# Patient Record
Sex: Male | Born: 1965 | Race: White | Hispanic: No | Marital: Married | Smoking: Never smoker
Health system: Southern US, Community
[De-identification: ages and names within clinical notes are randomized; demographics above are authoritative.]

## PROBLEM LIST (undated history)

## (undated) DIAGNOSIS — C189 Malignant neoplasm of colon, unspecified: Secondary | ICD-10-CM

## (undated) DIAGNOSIS — C787 Secondary malignant neoplasm of liver and intrahepatic bile duct: Secondary | ICD-10-CM

## (undated) HISTORY — DX: Secondary malignant neoplasm of liver and intrahepatic bile duct: C78.7

## (undated) HISTORY — DX: Malignant neoplasm of colon, unspecified: C18.9

---

## 2007-04-24 ENCOUNTER — Emergency Department: Payer: Self-pay | Admitting: Emergency Medicine

## 2007-04-25 ENCOUNTER — Emergency Department: Payer: Self-pay | Admitting: Emergency Medicine

## 2014-07-28 ENCOUNTER — Encounter (HOSPITAL_COMMUNITY): Payer: Self-pay | Admitting: Emergency Medicine

## 2014-07-28 ENCOUNTER — Inpatient Hospital Stay (HOSPITAL_COMMUNITY): Payer: BLUE CROSS/BLUE SHIELD

## 2014-07-28 ENCOUNTER — Inpatient Hospital Stay (HOSPITAL_COMMUNITY)
Admission: EM | Admit: 2014-07-28 | Discharge: 2014-08-01 | DRG: 374 | Disposition: A | Discharge status: Home / Self Care | Payer: BLUE CROSS/BLUE SHIELD | Attending: Internal Medicine | Admitting: Internal Medicine

## 2014-07-28 DIAGNOSIS — L720 Epidermal cyst: Secondary | ICD-10-CM | POA: Diagnosis present

## 2014-07-28 DIAGNOSIS — E669 Obesity, unspecified: Secondary | ICD-10-CM | POA: Diagnosis present

## 2014-07-28 DIAGNOSIS — D509 Iron deficiency anemia, unspecified: Secondary | ICD-10-CM

## 2014-07-28 DIAGNOSIS — F17220 Nicotine dependence, chewing tobacco, uncomplicated: Secondary | ICD-10-CM | POA: Diagnosis present

## 2014-07-28 DIAGNOSIS — C187 Malignant neoplasm of sigmoid colon: Secondary | ICD-10-CM | POA: Diagnosis present

## 2014-07-28 DIAGNOSIS — R109 Unspecified abdominal pain: Secondary | ICD-10-CM | POA: Diagnosis present

## 2014-07-28 DIAGNOSIS — Z79899 Other long term (current) drug therapy: Secondary | ICD-10-CM

## 2014-07-28 DIAGNOSIS — D72829 Elevated white blood cell count, unspecified: Secondary | ICD-10-CM | POA: Diagnosis present

## 2014-07-28 DIAGNOSIS — C189 Malignant neoplasm of colon, unspecified: Secondary | ICD-10-CM

## 2014-07-28 DIAGNOSIS — K922 Gastrointestinal hemorrhage, unspecified: Secondary | ICD-10-CM | POA: Diagnosis present

## 2014-07-28 DIAGNOSIS — Z6830 Body mass index (BMI) 30.0-30.9, adult: Secondary | ICD-10-CM

## 2014-07-28 DIAGNOSIS — Z95828 Presence of other vascular implants and grafts: Secondary | ICD-10-CM

## 2014-07-28 DIAGNOSIS — D490 Neoplasm of unspecified behavior of digestive system: Secondary | ICD-10-CM

## 2014-07-28 DIAGNOSIS — C787 Secondary malignant neoplasm of liver and intrahepatic bile duct: Secondary | ICD-10-CM | POA: Diagnosis present

## 2014-07-28 DIAGNOSIS — E43 Unspecified severe protein-calorie malnutrition: Secondary | ICD-10-CM | POA: Diagnosis present

## 2014-07-28 DIAGNOSIS — Z791 Long term (current) use of non-steroidal anti-inflammatories (NSAID): Secondary | ICD-10-CM | POA: Diagnosis not present

## 2014-07-28 DIAGNOSIS — D649 Anemia, unspecified: Secondary | ICD-10-CM

## 2014-07-28 DIAGNOSIS — D62 Acute posthemorrhagic anemia: Secondary | ICD-10-CM | POA: Diagnosis present

## 2014-07-28 DIAGNOSIS — Z419 Encounter for procedure for purposes other than remedying health state, unspecified: Secondary | ICD-10-CM

## 2014-07-28 DIAGNOSIS — Z800 Family history of malignant neoplasm of digestive organs: Secondary | ICD-10-CM

## 2014-07-28 DIAGNOSIS — K921 Melena: Secondary | ICD-10-CM | POA: Insufficient documentation

## 2014-07-28 DIAGNOSIS — C799 Secondary malignant neoplasm of unspecified site: Secondary | ICD-10-CM

## 2014-07-28 LAB — CBC WITH DIFFERENTIAL/PLATELET
BASOS ABS: 0 10*3/uL (ref 0.0–0.1)
BASOS PCT: 0 % (ref 0–1)
EOS ABS: 0.4 10*3/uL (ref 0.0–0.7)
EOS PCT: 4 % (ref 0–5)
HCT: 24.3 % — ABNORMAL LOW (ref 39.0–52.0)
Hemoglobin: 7.3 g/dL — ABNORMAL LOW (ref 13.0–17.0)
LYMPHS ABS: 2 10*3/uL (ref 0.7–4.0)
LYMPHS PCT: 16 % (ref 12–46)
MCH: 21.7 pg — ABNORMAL LOW (ref 26.0–34.0)
MCHC: 30 g/dL (ref 30.0–36.0)
MCV: 72.1 fL — ABNORMAL LOW (ref 78.0–100.0)
Monocytes Absolute: 0.9 10*3/uL (ref 0.1–1.0)
Monocytes Relative: 7 % (ref 3–12)
NEUTROS ABS: 9 10*3/uL — AB (ref 1.7–7.7)
Neutrophils Relative %: 73 % (ref 43–77)
Platelets: 575 10*3/uL — ABNORMAL HIGH (ref 150–400)
RBC: 3.37 MIL/uL — AB (ref 4.22–5.81)
RDW: 15.2 % (ref 11.5–15.5)
WBC: 12.4 10*3/uL — AB (ref 4.0–10.5)

## 2014-07-28 LAB — COMPREHENSIVE METABOLIC PANEL
ALT: 27 U/L (ref 0–53)
ANION GAP: 6 (ref 5–15)
AST: 45 U/L — ABNORMAL HIGH (ref 0–37)
Albumin: 2.8 g/dL — ABNORMAL LOW (ref 3.5–5.2)
Alkaline Phosphatase: 157 U/L — ABNORMAL HIGH (ref 39–117)
BUN: 18 mg/dL (ref 6–23)
CHLORIDE: 109 mmol/L (ref 96–112)
CO2: 25 mmol/L (ref 19–32)
Calcium: 8.3 mg/dL — ABNORMAL LOW (ref 8.4–10.5)
Creatinine, Ser: 0.81 mg/dL (ref 0.50–1.35)
GFR calc Af Amer: >90 mL/min (ref >90)
Glucose, Bld: 110 mg/dL — ABNORMAL HIGH (ref 70–99)
POTASSIUM: 4 mmol/L (ref 3.5–5.1)
Sodium: 140 mmol/L (ref 135–145)
Total Bilirubin: 0.2 mg/dL — ABNORMAL LOW (ref 0.3–1.2)
Total Protein: 5.8 g/dL — ABNORMAL LOW (ref 6.0–8.3)

## 2014-07-28 LAB — URINALYSIS, ROUTINE W REFLEX MICROSCOPIC
Bilirubin Urine: NEGATIVE
GLUCOSE, UA: NEGATIVE mg/dL
KETONES UR: NEGATIVE mg/dL
Leukocytes, UA: NEGATIVE
NITRITE: NEGATIVE
Protein, ur: >300 mg/dL — AB
SPECIFIC GRAVITY, URINE: 1.02 (ref 1.005–1.030)
Urobilinogen, UA: 0.2 mg/dL (ref 0.0–1.0)
pH: 6 (ref 5.0–8.0)

## 2014-07-28 LAB — PROTIME-INR
INR: 1.01 (ref 0.00–1.49)
PROTHROMBIN TIME: 13.4 s (ref 11.6–15.2)

## 2014-07-28 LAB — URINE MICROSCOPIC-ADD ON

## 2014-07-28 LAB — IRON AND TIBC
Iron: <10 ug/dL — ABNORMAL LOW (ref 42–165)
UIBC: 340 ug/dL (ref 125–400)

## 2014-07-28 LAB — POC OCCULT BLOOD, ED: Fecal Occult Bld: POSITIVE — AB

## 2014-07-28 LAB — RETICULOCYTES
RBC.: 3.42 MIL/uL — ABNORMAL LOW (ref 4.22–5.81)
RETIC CT PCT: 1.1 % (ref 0.4–3.1)
Retic Count, Absolute: 37.6 10*3/uL (ref 19.0–186.0)

## 2014-07-28 LAB — ABO/RH
ABO/RH(D): A POS
ABO/RH(D): A POS

## 2014-07-28 LAB — PREPARE RBC (CROSSMATCH)

## 2014-07-28 MED ORDER — ASPIRIN EC 81 MG PO TBEC
81.0000 mg | DELAYED_RELEASE_TABLET | Freq: Every day | ORAL | Status: DC
  Filled 2014-07-28: qty 1

## 2014-07-28 MED ORDER — SODIUM CHLORIDE 0.9 % IV SOLN
INTRAVENOUS | Status: AC
  Administered 2014-07-28: 50 mL/h via INTRAVENOUS

## 2014-07-28 MED ORDER — ONDANSETRON HCL 4 MG/2ML IJ SOLN: 4.0000 mg | Freq: Four times a day (QID) | INTRAMUSCULAR | Status: DC | PRN

## 2014-07-28 MED ORDER — PANTOPRAZOLE SODIUM 40 MG IV SOLR
8.0000 mg/h | INTRAVENOUS | Status: DC
  Administered 2014-07-28 – 2014-07-30 (×5): 8 mg/h via INTRAVENOUS
  Filled 2014-07-28 (×13): qty 80

## 2014-07-28 MED ORDER — ONDANSETRON HCL 4 MG PO TABS: 4.0000 mg | ORAL_TABLET | Freq: Four times a day (QID) | ORAL | Status: DC | PRN

## 2014-07-28 MED ORDER — PANTOPRAZOLE SODIUM 40 MG IV SOLR
80.0000 mg | Freq: Once | INTRAVENOUS | Status: AC
  Administered 2014-07-28: 80 mg via INTRAVENOUS
  Filled 2014-07-28 (×2): qty 80

## 2014-07-28 MED ORDER — PANTOPRAZOLE SODIUM 40 MG IV SOLR
40.0000 mg | Freq: Two times a day (BID) | INTRAVENOUS | Status: DC
  Administered 2014-08-01: 40 mg via INTRAVENOUS
  Filled 2014-07-28: qty 40

## 2014-07-28 MED ORDER — SODIUM CHLORIDE 0.9 % IV SOLN: 8.0000 mg/h | INTRAVENOUS | Status: DC

## 2014-07-28 MED ORDER — GI COCKTAIL ~~LOC~~: 30.0000 mL | Freq: Three times a day (TID) | ORAL | Status: DC | PRN

## 2014-07-28 MED ORDER — ACETAMINOPHEN 325 MG PO TABS
650.0000 mg | ORAL_TABLET | Freq: Four times a day (QID) | ORAL | Status: DC | PRN
  Administered 2014-07-28 – 2014-07-30 (×2): 650 mg via ORAL
  Filled 2014-07-28 (×2): qty 2

## 2014-07-28 MED ORDER — SODIUM CHLORIDE 0.9 % IJ SOLN
3.0000 mL | Freq: Two times a day (BID) | INTRAMUSCULAR | Status: DC
  Administered 2014-07-30 – 2014-08-01 (×3): 3 mL via INTRAVENOUS

## 2014-07-28 MED ORDER — SODIUM CHLORIDE 0.45 % IV SOLN
INTRAVENOUS | Status: DC
  Administered 2014-07-28 – 2014-07-29 (×2): via INTRAVENOUS

## 2014-07-28 MED ORDER — ACETAMINOPHEN 650 MG RE SUPP
650.0000 mg | Freq: Four times a day (QID) | RECTAL | Status: DC | PRN
Start: 2014-07-28 — End: 2014-08-01

## 2014-07-28 MED ORDER — POLYETHYLENE GLYCOL 3350 17 G PO PACK: 17.0000 g | PACK | Freq: Every day | ORAL | Status: DC | PRN

## 2014-07-28 NOTE — Progress Notes (Signed)
Pt arrived on the unit at 1850.  Got pt settled, VSS, reported off to night nurse.

## 2014-07-28 NOTE — ED Notes (Signed)
Patient sent here by [facility] for abnormal labs. Per patient went there because he was pale in color. Patient's hgb 7.5- hct 24.5. Patient denies every having blood transfusion in past. Patient does report feeling "slightly" weak.

## 2014-07-28 NOTE — H&P (Signed)
[facility] History and Physical  Tom [name] XMI:[id number] DOB: Oct 15, 1965 DOA: 07/28/2014  Referring physician: Dr Wyvonnia Dusky - [facility] PCP: No primary care provider on file.   Chief Complaint: low Hgb  HPI: Tom [name] is a 49 y.o. male  Patient seen at [facility] urgent care on 07/26/2014 for lack of energy, mild abdominal pain and pale color. Abdominal pain is described as an "upset stomach feeling ". Worse with food. Was called today and notified of a very low hemoglobin and was told to come to the emergency room. Patient reports noting blood in his stool in October but has not seen any since that time. Endorses daily soft greenish to brown bowel movements. The history of hemorrhoids. Patient reports it 20 pound unintentional weight loss over the past year. Denies melena, fever, malaise, night sweats, chest pain, short of breath, palpitations, lightheadedness, syncope.  Patient does not go to the physician on any regular basis.  Patient also reporting he was diagnosed with a small right lumbar abscess. Told that this did not need to be drained at this time. Patient reports that this small lump is intermittently tender. Denies fevers or periodic discharge. This issue is constant. Lump was noticed just a couple weeks ago.  Review of Systems:  Constitutional:  No  night sweats, Fevers, chills. HEENT:  No headaches, Difficulty swallowing,Tooth/dental problems,Sore throat,  No sneezing, itching, ear ache, nasal congestion, post nasal drip,  Cardio-vascular:  No chest pain, Orthopnea, PND, swelling in lower extremities, anasarca, dizziness, palpitations  GI:  Per history of present illness Resp:  No shortness of breath with exertion or at rest. No excess mucus, no productive cough, No non-productive cough, No coughing up of blood.No change in color of mucus.No wheezing.No chest wall deformity  Skin:  no rash or lesions.  GU:  no dysuria, change in color of urine, no urgency  or frequency. No flank pain.  Musculoskeletal:   No joint pain or swelling. No decreased range of motion. No back pain.  Psych:  No change in mood or affect. No depression or anxiety. No memory loss.   History reviewed. No pertinent past medical history. History reviewed. No pertinent past surgical history. Social History:  reports that he has never smoked. He quit smokeless tobacco use about 21 years ago. His smokeless tobacco use included Chew. He reports that he does not drink alcohol or use illicit drugs.  No Known Allergies  Family History  Problem Relation Age of Onset  . Cancer Mother   . Kidney failure Father      Prior to Admission medications   Medication Sig Start Date End Date Taking? Authorizing Provider  ibuprofen (ADVIL,MOTRIN) 200 MG tablet Take 60 mg by mouth every 6 (six) hours as needed for mild pain.   Yes Historical Provider, MD  Multiple Vitamin (MULTIVITAMIN WITH MINERALS) TABS tablet Take 1 tablet by mouth daily.   Yes Historical Provider, MD   Physical Exam: Filed Vitals:   07/28/14 1630 07/28/14 1649 07/28/14 1714 07/28/14 1730  BP: 124/75 126/93 129/85 122/77  Pulse: 81 82 87 80  Temp: 98.6 F (37 C) 98.2 F (36.8 C) 98.5 F (36.9 C)   TempSrc: Oral Oral Oral   Resp: 18 18 18    Height:      Weight:      SpO2: 100% 100% 100% 100%    Wt Readings from Last 3 Encounters:  07/28/14 85.276 kg (188 lb)    General:  Appears calm and comfortable Eyes:  PERRL, normal lids, irises & conjunctiva ENT:  grossly normal hearing, lips & tongue Neck:  no LAD, masses or thyromegaly Cardiovascular:  RRR, no m/r/g. No LE edema. Telemetry:  SR, no arrhythmias  Respiratory:  CTA bilaterally, no w/r/r. Normal respiratory effort. Abdomen:  soft, ntnd, negative Murphy sign. Nontender at McBurney's point Skin: Pale no rash or induration seen on limited exam Musculoskeletal:  grossly normal tone BUE/BLE 1.5 cm well-circumscribed freely movable cyst in right lumbar  paraspinal region. Psychiatric:  grossly normal mood and affect, speech fluent and appropriate Neurologic:  grossly non-focal.          Labs on Admission:  Basic Metabolic Panel:  Recent Labs Lab 07/28/14 1440  NA 140  K 4.0  CL 109  CO2 25  GLUCOSE 110*  BUN 18  CREATININE 0.81  CALCIUM 8.3*   Liver Function Tests:  Recent Labs Lab 07/28/14 1440  AST 45*  ALT 27  ALKPHOS 157*  BILITOT 0.2*  PROT 5.8*  ALBUMIN 2.8*   No results for input(s): LIPASE, AMYLASE in the last 168 hours. No results for input(s): AMMONIA in the last 168 hours. CBC:  Recent Labs Lab 07/28/14 1440  WBC 12.4*  NEUTROABS 9.0*  HGB 7.3*  HCT 24.3*  MCV 72.1*  PLT 575*   Cardiac Enzymes: No results for input(s): CKTOTAL, CKMB, CKMBINDEX, TROPONINI in the last 168 hours.  BNP (last 3 results) No results for input(s): BNP in the last 8760 hours.  ProBNP (last 3 results) No results for input(s): PROBNP in the last 8760 hours.  CBG: No results for input(s): GLUCAP in the last 168 hours.  Radiological Exams on Admission: No results found.  EKG: Independently reviewed.  sinus, tachycardic, borderline repolarization, no sign of ACS.  Assessment/Plan Principal Problem:   GI bleed Active Problems:   Microcytic anemia   Abdominal pain   Leukocytosis   Epidermoid cyst  GI bleed: GI most likely source of loss of blood. History of visible bloody stools and 20 pound weight loss concerning possible malignancy. Discussed case with  Dr. Benson Norway of lower GI who agrees that patient needs further workup and would like patient transferred to [place] - Transfusion as below - IV Protonix - Half-normal saline 75 mL per hour  - Appreciate GI recommendations - NPO after midnight unless indicated otherwise by GI  Abdominal pain: Currently pain-free. Alkaline phosphatase 157. AST minimally elevated to 45. Albumin 2.8.  - Lactic acid - Abdominal ultrasound -  Lipase  Anemia: Hemoglobin 7.3. No previous for comparison. Likely acute GI loss on chronic disease. Microcytic with MCV 72.1. Transfusion of 2 units PRBC ordered by ED. - Anemia panel - Posttransfusion labs - GI workup as above  Leukocytosis: WBC 12.4. Likely stress-induced. No acute sign of infection.  Epidermoid cyst: Benign cyst noted in right lumbar paraspinal region. Freely movable. No evidence of vertebral or nerve involvement. - No need for excision or incision and drainage at this time.  Routine screening: - A1c - Lipid panel - Start ASA   Code Status: FULL DVT Prophylaxis: SCD Family Communication: Son Disposition Plan: pending transfer to [facility] and further workup   Gyan Cambre J, MD Family Medicine [facility] [email or web address] Password [email or web address]

## 2014-07-28 NOTE — ED Notes (Signed)
Pt states he is unable to provide urine sample at this time

## 2014-07-28 NOTE — ED Provider Notes (Signed)
CSN: [id number]     Arrival date & time 07/28/14  1409 History  This chart was scribed for Ezequiel Essex, MD by Molli Posey, ED Scribe. This patient was seen in room [facility] and the patient's care was started 2:20 PM.    Chief Complaint  Patient presents with  . Abnormal Lab   The history is provided by the patient. No language interpreter was used.   HPI Comments: Tom [name] is a 49 y.o. male who presents to the Emergency Department complaining of an abnormal hemoglobin lab that he was notified about today. Pt was seen at [facility] on 07/26/14 for lack of energy, mild abdominal pain and pale color. Pt says that he was called today by the urgent care and was told he had an abnormal hemoglobin lab and to present to ED for possible transfusion. Pt reports that he had blood in his stool in October but denies any since that time. Pt reports no history of hemorrhoids. He states that he has lost about 20lbs in the last year. He states he takes no medication on a regular basis. Pt denies blood in his stool, dark stool, vomiting, dizziness and light headedness.    History reviewed. No pertinent past medical history. History reviewed. No pertinent past surgical history. Family History  Problem Relation Age of Onset  . Cancer Mother    History  Substance Use Topics  . Smoking status: Never Smoker   . Smokeless tobacco: Former Systems developer    Types: [place] date: 06/01/1993  . Alcohol Use: No    Review of Systems  Constitutional: Positive for fatigue.  Gastrointestinal: Negative for vomiting and blood in stool.  Neurological: Negative for dizziness and light-headedness.  All other systems reviewed and are negative.   Allergies  Review of patient's allergies indicates no known allergies.  Home Medications   Prior to Admission medications   Medication Sig Start Date End Date Taking? Authorizing Provider  ibuprofen (ADVIL,MOTRIN) 200 MG tablet Take 60 mg by mouth  every 6 (six) hours as needed for mild pain.   Yes Historical Provider, MD  Multiple Vitamin (MULTIVITAMIN WITH MINERALS) TABS tablet Take 1 tablet by mouth daily.   Yes Historical Provider, MD   BP 124/75 mmHg  Pulse 81  Temp(Src) 99.6 F (37.6 C) (Oral)  Resp 18  Ht 5\' 6"  (1.676 m)  Wt 188 lb (85.276 kg)  BMI 30.36 kg/m2  SpO2 100% Physical Exam  Constitutional: He is oriented to person, place, and time. He appears well-developed and well-nourished. No distress.  Appears pale. Poor dentition.   HENT:  Head: Normocephalic and atraumatic.  Mouth/Throat: Oropharynx is clear and moist. No oropharyngeal exudate.  Eyes: Conjunctivae and EOM are normal. Pupils are equal, round, and reactive to light.  Neck: Normal range of motion. Neck supple.  No meningismus.  Cardiovascular: Normal rate, regular rhythm, normal heart sounds and intact distal pulses.   No murmur heard. Pulmonary/Chest: Effort normal and breath sounds normal. No respiratory distress.  Abdominal: Soft. There is no tenderness. There is no rebound and no guarding.  Genitourinary:  Rectal exam shows brown stool, no hemorrhoids.   Musculoskeletal: Normal range of motion. He exhibits no edema or tenderness.  Neurological: He is alert and oriented to person, place, and time. No cranial nerve deficit. He exhibits normal muscle tone. Coordination normal.  No ataxia on finger to nose bilaterally. No pronator drift. 5/5 strength throughout. CN 2-12 intact. Negative Romberg. Equal grip strength.  Sensation intact. Gait is normal.   Skin: Skin is warm.  Psychiatric: He has a normal mood and affect. His behavior is normal.  Nursing note and vitals reviewed.   ED Course  Procedures   DIAGNOSTIC STUDIES: Oxygen Saturation is 100% on RA, normal by my interpretation.    COORDINATION OF CARE: 2:30 PM Discussed treatment plan with pt at bedside and pt agreed to plan.   Labs Review Labs Reviewed  CBC WITH DIFFERENTIAL/PLATELET -  Abnormal; Notable for the following:    WBC 12.4 (*)    RBC 3.37 (*)    Hemoglobin 7.3 (*)    HCT 24.3 (*)    MCV 72.1 (*)    MCH 21.7 (*)    Platelets 575 (*)    Neutro Abs 9.0 (*)    All other components within normal limits  COMPREHENSIVE METABOLIC PANEL - Abnormal; Notable for the following:    Glucose, Bld 110 (*)    Calcium 8.3 (*)    Total Protein 5.8 (*)    Albumin 2.8 (*)    AST 45 (*)    Alkaline Phosphatase 157 (*)    Total Bilirubin 0.2 (*)    All other components within normal limits  RETICULOCYTES - Abnormal; Notable for the following:    RBC. 3.42 (*)    All other components within normal limits  POC OCCULT BLOOD, ED - Abnormal; Notable for the following:    Fecal Occult Bld POSITIVE (*)    All other components within normal limits  PROTIME-INR  URINALYSIS, ROUTINE W REFLEX MICROSCOPIC  VITAMIN B12  FOLATE  IRON AND TIBC  FERRITIN  TYPE AND SCREEN  PREPARE RBC (CROSSMATCH)  ABO/RH    Imaging Review No results found.   EKG Interpretation   Date/Time:  Saturday July 28 2014 14:41:21 EST Ventricular Rate:  95 PR Interval:  142 QRS Duration: 94 QT Interval:  345 QTC Calculation: 434 R Axis:   69 Text Interpretation:  Sinus rhythm Borderline repolarization abnormality  No previous ECGs available Confirmed by Wyvonnia Dusky  MD, Thursa Emme [phone]) on  07/28/2014 2:48:31 PM      MDM   Final diagnoses:  Gastrointestinal hemorrhage with melena  Anemia, unspecified anemia type   Patient is sent by urgent care for hemoglobin of 7.5. Patient states he's been having generalized weakness for several days. Denies any blood in the stool though they have been dark. Denies any abdominal pain, chest pain, shortness of breath, dizziness or lightheadedness.  Hemoglobin 7.3 with Hemoccult-positive stools. Patient agreeable to transfusion.  Vitals stable. Anemia panel sent. There is no GI coverage at [facility]. Discussed with Dr. Marily Memos who will arrange  transfer to [facility] for endoscopy by Dr. Benson Norway.  CRITICAL CARE Performed by: Ezequiel Essex Total critical care time: 30 Critical care time was exclusive of separately billable procedures and treating other patients. Critical care was necessary to treat or prevent imminent or life-threatening deterioration. Critical care was time spent personally by me on the following activities: development of treatment plan with patient and/or surrogate as well as nursing, discussions with consultants, evaluation of patient's response to treatment, examination of patient, obtaining history from patient or surrogate, ordering and performing treatments and interventions, ordering and review of laboratory studies, ordering and review of radiographic studies, pulse oximetry and re-evaluation of patient's condition.   I personally performed the services described in this documentation, which was scribed in my presence. The recorded information has been reviewed and is accurate.  Ezequiel Essex, MD 07/28/14 [phone]

## 2014-07-29 LAB — LIPID PANEL
Cholesterol: 258 mg/dL — ABNORMAL HIGH (ref 0–200)
HDL: 34 mg/dL — ABNORMAL LOW (ref >39)
LDL CALC: 200 mg/dL — AB (ref 0–99)
Total CHOL/HDL Ratio: 7.6 RATIO
Triglycerides: 120 mg/dL (ref <150)
VLDL: 24 mg/dL (ref 0–40)

## 2014-07-29 LAB — TYPE AND SCREEN
ABO/RH(D): A POS
ANTIBODY SCREEN: NEGATIVE
Unit division: 0
Unit division: 0

## 2014-07-29 LAB — LIPASE, BLOOD: Lipase: 22 U/L (ref 11–59)

## 2014-07-29 LAB — LACTIC ACID, PLASMA: Lactic Acid, Venous: 1 mmol/L (ref 0.5–2.0)

## 2014-07-29 LAB — COMPREHENSIVE METABOLIC PANEL
ALBUMIN: 2.8 g/dL — AB (ref 3.5–5.2)
ALK PHOS: 148 U/L — AB (ref 39–117)
ALT: 23 U/L (ref 0–53)
AST: 41 U/L — AB (ref 0–37)
Anion gap: 5 (ref 5–15)
BUN: 14 mg/dL (ref 6–23)
CO2: 26 mmol/L (ref 19–32)
Calcium: 8.4 mg/dL (ref 8.4–10.5)
Chloride: 105 mmol/L (ref 96–112)
Creatinine, Ser: 0.72 mg/dL (ref 0.50–1.35)
Glucose, Bld: 90 mg/dL (ref 70–99)
Potassium: 4 mmol/L (ref 3.5–5.1)
Sodium: 136 mmol/L (ref 135–145)
Total Bilirubin: 0.8 mg/dL (ref 0.3–1.2)
Total Protein: 6.3 g/dL (ref 6.0–8.3)

## 2014-07-29 LAB — CBC
HCT: 29.7 % — ABNORMAL LOW (ref 39.0–52.0)
Hemoglobin: 9.3 g/dL — ABNORMAL LOW (ref 13.0–17.0)
MCH: 23.2 pg — AB (ref 26.0–34.0)
MCHC: 31.3 g/dL (ref 30.0–36.0)
MCV: 74.1 fL — ABNORMAL LOW (ref 78.0–100.0)
Platelets: 507 10*3/uL — ABNORMAL HIGH (ref 150–400)
RBC: 4.01 MIL/uL — ABNORMAL LOW (ref 4.22–5.81)
RDW: 16.2 % — AB (ref 11.5–15.5)
WBC: 12.1 10*3/uL — ABNORMAL HIGH (ref 4.0–10.5)

## 2014-07-29 LAB — GLUCOSE, CAPILLARY
GLUCOSE-CAPILLARY: 70 mg/dL (ref 70–99)
Glucose-Capillary: 96 mg/dL (ref 70–99)
Glucose-Capillary: 78 mg/dL (ref 70–99)

## 2014-07-29 LAB — FERRITIN: Ferritin: 25 ng/mL (ref 22–322)

## 2014-07-29 LAB — VITAMIN B12: VITAMIN B 12: 715 pg/mL (ref 211–911)

## 2014-07-29 LAB — FOLATE

## 2014-07-29 MED ORDER — SODIUM CHLORIDE 0.9 % IV SOLN
INTRAVENOUS | Status: DC
  Administered 2014-07-29: 14:00:00 via INTRAVENOUS

## 2014-07-29 MED ORDER — PEG 3350-KCL-NA BICARB-NACL 420 G PO SOLR
4000.0000 mL | Freq: Once | ORAL | Status: AC
  Administered 2014-07-29: 4000 mL via ORAL

## 2014-07-29 NOTE — Consult Note (Signed)
Consult for Tom [name] GI  Reason for Consult: Anemia, Hematochezia, and Weight loss Referring Physician: [facility]  [name] HPI: This is a 49 year old male admitted for an anemia.  In October 2015 he sought treatment from an Urgent Care facility.  At that time he was feeling weak and he was diagnosed with a diverticulitis.  The patient did report a weight loss of 20 lbs from Jan 2015 (210 lbs) to Oct 2015 (190 lbs) as well as some intermittent hematochezia.  After a month of antibiotic treatment, per his report, he felt well and he did not have any further symptoms until the beginning of the year.  He started to feel weak again and he sought care.  His HGB was drawn and it was noted to be at 7.3, which prompted his ER admission.  The patient denies any further issues with hematochezia, but he reports ongoing back pain since October.  A rectal examination was performed and it was positive for blood.  No known family history of colon cancer and he denies any routine NSAID use.  History reviewed. No pertinent past medical history.  History reviewed. No pertinent past surgical history.  Family History  Problem Relation Age of Onset  . Cancer Mother   . Kidney failure Father     Social History:  reports that he has never smoked. He quit smokeless tobacco use about 21 years ago. His smokeless tobacco use included Chew. He reports that he does not drink alcohol or use illicit drugs.  Allergies: No Known Allergies  Medications:  Scheduled: . aspirin EC  81 mg Oral Daily  . [START ON 08/01/2014] pantoprazole (PROTONIX) IV  40 mg Intravenous Q12H  . sodium chloride  3 mL Intravenous Q12H   Continuous: . sodium chloride 75 mL/hr at 07/28/14 1922  . pantoprozole (PROTONIX) infusion 8 mg/hr (07/28/14 2041)    Results for orders placed or performed during the hospital encounter of 07/28/14 (from the past 24 hour(s))  POC occult blood, ED Provider will collect     Status: Abnormal    Collection Time: 07/28/14  2:31 PM  Result Value Ref Range   Fecal Occult Bld POSITIVE (A) NEGATIVE  CBC with Differential/Platelet     Status: Abnormal   Collection Time: 07/28/14  2:40 PM  Result Value Ref Range   WBC 12.4 (H) 4.0 - 10.5 K/uL   RBC 3.37 (L) 4.22 - 5.81 MIL/uL   Hemoglobin 7.3 (L) 13.0 - 17.0 g/dL   HCT 24.3 (L) 39.0 - 52.0 %   MCV 72.1 (L) 78.0 - 100.0 fL   MCH 21.7 (L) 26.0 - 34.0 pg   MCHC 30.0 30.0 - 36.0 g/dL   RDW 15.2 11.5 - 15.5 %   Platelets 575 (H) 150 - 400 K/uL   Neutrophils Relative % 73 43 - 77 %   Neutro Abs 9.0 (H) 1.7 - 7.7 K/uL   Lymphocytes Relative 16 12 - 46 %   Lymphs Abs 2.0 0.7 - 4.0 K/uL   Monocytes Relative 7 3 - 12 %   Monocytes Absolute 0.9 0.1 - 1.0 K/uL   Eosinophils Relative 4 0 - 5 %   Eosinophils Absolute 0.4 0.0 - 0.7 K/uL   Basophils Relative 0 0 - 1 %   Basophils Absolute 0.0 0.0 - 0.1 K/uL  Comprehensive metabolic panel     Status: Abnormal   Collection Time: 07/28/14  2:40 PM  Result Value Ref Range   Sodium 140 135 -  145 mmol/L   Potassium 4.0 3.5 - 5.1 mmol/L   Chloride 109 96 - 112 mmol/L   CO2 25 19 - 32 mmol/L   Glucose, Bld 110 (H) 70 - 99 mg/dL   BUN 18 6 - 23 mg/dL   Creatinine, Ser 0.81 0.50 - 1.35 mg/dL   Calcium 8.3 (L) 8.4 - 10.5 mg/dL   Total Protein 5.8 (L) 6.0 - 8.3 g/dL   Albumin 2.8 (L) 3.5 - 5.2 g/dL   AST 45 (H) 0 - 37 U/L   ALT 27 0 - 53 U/L   Alkaline Phosphatase 157 (H) 39 - 117 U/L   Total Bilirubin 0.2 (L) 0.3 - 1.2 mg/dL   GFR calc non Af Amer >90 >90 mL/min   GFR calc Af Amer >90 >90 mL/min   Anion gap 6 5 - 15  Protime-INR     Status: None   Collection Time: 07/28/14  2:40 PM  Result Value Ref Range   Prothrombin Time 13.4 11.6 - 15.2 seconds   INR 1.01 0.00 - 1.49  Vitamin B12     Status: None   Collection Time: 07/28/14  2:40 PM  Result Value Ref Range   Vitamin B-12 715 211 - 911 pg/mL  Folate     Status: None   Collection Time: 07/28/14  2:40 PM  Result Value Ref Range    Folate >20.0 ng/mL  Iron and TIBC     Status: Abnormal   Collection Time: 07/28/14  2:40 PM  Result Value Ref Range   Iron <10 (L) 42 - 165 ug/dL   TIBC Not calculated due to Iron <10. 215 - 435 ug/dL   Saturation Ratios Not calculated due to Iron <10. 20 - 55 %   UIBC 340 125 - 400 ug/dL  Ferritin     Status: None   Collection Time: 07/28/14  2:40 PM  Result Value Ref Range   Ferritin 25 22 - 322 ng/mL  Reticulocytes     Status: Abnormal   Collection Time: 07/28/14  2:40 PM  Result Value Ref Range   Retic Ct Pct 1.1 0.4 - 3.1 %   RBC. 3.42 (L) 4.22 - 5.81 MIL/uL   Retic Count, Manual 37.6 19.0 - 186.0 K/uL  Type and screen     Status: None (Preliminary result)   Collection Time: 07/28/14  3:05 PM  Result Value Ref Range   ABO/RH(D) A POS    Antibody Screen NEG    Sample Expiration 07/31/2014    Unit Number [id number]    Blood Component Type RED CELLS,LR    Unit division 00    Status of Unit ALLOCATED    Transfusion Status OK TO TRANSFUSE    Crossmatch Result Compatible    Unit Number [id number]    Blood Component Type RED CELLS,LR    Unit division 00    Status of Unit ISSUED,FINAL    Transfusion Status OK TO TRANSFUSE    Crossmatch Result Compatible   Prepare RBC     Status: None   Collection Time: 07/28/14  3:05 PM  Result Value Ref Range   Order Confirmation ORDER PROCESSED BY BLOOD BANK   ABO/Rh     Status: None   Collection Time: 07/28/14  3:05 PM  Result Value Ref Range   ABO/RH(D) A POS   Urinalysis, Routine w reflex microscopic     Status: Abnormal   Collection Time: 07/28/14  8:12 PM  Result Value Ref Range   Color, Urine YELLOW  YELLOW   APPearance CLEAR CLEAR   Specific Gravity, Urine 1.020 1.005 - 1.030   pH 6.0 5.0 - 8.0   Glucose, UA NEGATIVE NEGATIVE mg/dL   Hgb urine dipstick SMALL (A) NEGATIVE   Bilirubin Urine NEGATIVE NEGATIVE   Ketones, ur NEGATIVE NEGATIVE mg/dL   Protein, ur >300 (A) NEGATIVE mg/dL   Urobilinogen, UA 0.2 0.0 - 1.0  mg/dL   Nitrite NEGATIVE NEGATIVE   Leukocytes, UA NEGATIVE NEGATIVE  Urine microscopic-add on     Status: None   Collection Time: 07/28/14  8:12 PM  Result Value Ref Range   Squamous Epithelial / LPF RARE RARE   WBC, UA 3-6 <3 WBC/hpf   RBC / HPF 0-2 <3 RBC/hpf   Bacteria, UA RARE RARE  Type and screen     Status: None (Preliminary result)   Collection Time: 07/28/14  8:23 PM  Result Value Ref Range   ABO/RH(D) A POS    Antibody Screen NEG    Sample Expiration 07/31/2014    Unit Number [id number]    Blood Component Type RED CELLS,LR    Unit division 00    Status of Unit ISSUED    Transfusion Status OK TO TRANSFUSE    Crossmatch Result Compatible   ABO/Rh     Status: None   Collection Time: 07/28/14  8:23 PM  Result Value Ref Range   ABO/RH(D) A POS      US Abdomen Complete  07/29/2014   CLINICAL DATA:  Abdominal pain.  EXAM: ULTRASOUND ABDOMEN COMPLETE  COMPARISON:  None.  FINDINGS: Gallbladder: No gallstones or wall thickening visualized. No sonographic Murphy sign noted.  Common bile duct: Diameter: 3.9 mm, normal  Liver: Multiple heterogeneous mass lesions are demonstrated throughout the liver, some with central cavitation. 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 Suggest CT for further evaluation.  IVC: No abnormality visualized.  Pancreas: Pancreas is incompletely visualized due to overlying bowel gas. Visualized head and neck appear normal.  Spleen: Size and appearance within normal limits.  Right Kidney: Length: 12.4 cm. Echogenicity within normal limits. No mass or hydronephrosis visualized.  Left Kidney: Length: 13.2 cm. Simple appearing cyst in the upper pole of the left kidney measuring 3.3 cm diameter. No hydronephrosis or solid mass identified.  Abdominal aorta: No aneurysm visualized.  Other findings: None.  IMPRESSION: Multiple heterogeneous mass lesions throughout the liver, some with central  cavitation. 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   Electronically Signed   By: Lucienne Capers M.D.   On: 07/29/2014 00:28    ROS:  As stated above in the HPI otherwise negative.  Blood pressure 122/73, pulse 67, temperature 98.6 F (37 C), temperature source Oral, resp. rate 18, height 5\' 6"  (1.676 m), weight 85.276 kg (188 lb), SpO2 100 %.    PE: Gen: NAD, Alert and Oriented HEENT:  [place]/AT, EOMI Neck: Supple, no LAD Lungs: CTA Bilaterally CV: RRR without M/G/R ABM: Soft, NTND, +BS Ext: No C/C/E  Assessment/Plan: 1) Microcytic anemia. 2) Heme positive stool. 3) Weight loss. 4) Back pain.   I am very concerned about the patient's current presentation.  It is suspicious for a malignancy.  Further evaluation with an EGD/Colonoscopy needs to be pursued.  Plan: 1) EGD/Colonoscopy tomorrow with Dr. Deatra Ina. Lillybeth Tal D 07/29/2014, 8:08 AM

## 2014-07-29 NOTE — Progress Notes (Signed)
PROGRESS NOTE  Tom [name] OVF:[id number] DOB: 08-20-1965 DOA: 07/28/2014 PCP: No primary care provider on file.  HPI: Pleasant 49 year old male admitted on 2/27 with anemia, weight loss, GI bleed, concerning for a GI malignancy.  Subjective / 24 H Interval events - Patient doing well this morning, he feels better after blood transfusion yesterday  Assessment/Plan: Principal Problem:   GI bleed Active Problems:   Microcytic anemia   Abdominal pain   Leukocytosis   Epidermoid cyst   GI bleed - this looks acute on chronic, he received 2 units of packed red blood cells on 2/27 and his hemoglobin increased appropriately from 7.3-9.3 - Appreciate gastroenterology assistance, plan for endoscopy and colonoscopy tomorrow - Continue PPI  Microcytic anemia - likely due to #1, he has iron deficiency anemia - We'll start iron supplementation on discharge  Abdominal pain - Resolved  Diet: Diet clear liquid Diet NPO time specified Fluids: Normal saline DVT Prophylaxis: SCDs  Code Status: Full Code Family Communication: none bedside  Disposition Plan: inpatient  Consultants:  GI  Procedures:  None    Antibiotics  Anti-infectives    None      Studies  US Abdomen Complete  07/29/2014   CLINICAL DATA:  Abdominal pain.  EXAM: ULTRASOUND ABDOMEN COMPLETE  COMPARISON:  None.  FINDINGS: Gallbladder: No gallstones or wall thickening visualized. No sonographic Murphy sign noted.  Common bile duct: Diameter: 3.9 mm, normal  Liver: Multiple heterogeneous mass lesions are demonstrated throughout the liver, some with central cavitation. 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 Suggest CT for further evaluation.  IVC: No abnormality visualized.  Pancreas: Pancreas is incompletely visualized due to overlying bowel gas. Visualized head and neck appear normal.  Spleen: Size and appearance within normal limits.   Right Kidney: Length: 12.4 cm. Echogenicity within normal limits. No mass or hydronephrosis visualized.  Left Kidney: Length: 13.2 cm. Simple appearing cyst in the upper pole of the left kidney measuring 3.3 cm diameter. No hydronephrosis or solid mass identified.  Abdominal aorta: No aneurysm visualized.  Other findings: None.  IMPRESSION: Multiple heterogeneous mass lesions throughout the liver, some with central cavitation. 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   Electronically Signed   By: Lucienne Capers M.D.   On: 07/29/2014 00:28    Objective  Filed Vitals:   07/28/14 2259 07/28/14 2333 07/29/14 0208 07/29/14 0653  BP: 112/66 112/68 117/77 122/73  Pulse: 75 73 64 67  Temp: 98.9 F (37.2 C) 99.4 F (37.4 C) 99.1 F (37.3 C) 98.6 F (37 C)  TempSrc: Oral Oral Oral Oral  Resp: 18 18 18 18   Height:      Weight:      SpO2: 97% 97% 100% 100%    Intake/Output Summary (Last 24 hours) at 07/29/14 1059 Last data filed at 07/29/14 0208  Gross per 24 hour  Intake 1015.42 ml  Output    550 ml  Net 465.42 ml   Filed Weights   07/28/14 1413  Weight: 85.276 kg (188 lb)    Exam:  General:  NAD  HEENT: no scleral icterus  Cardiovascular: RRR  Respiratory: CTA biL  Abdomen: soft, non tender  MSK/Extremities: no clubbing/cyanosis  Skin: no rashes  Neuro: non focal  Data Reviewed: Basic Metabolic Panel:  Recent Labs Lab 07/28/14 1440 07/29/14 0847  NA 140 136  K 4.0 4.0  CL 109 105  CO2 25 26  GLUCOSE 110* 90  BUN 18 14  CREATININE 0.81 0.72  CALCIUM 8.3* 8.4   Liver Function Tests:  Recent Labs Lab 07/28/14 1440 07/29/14 0847  AST 45* 41*  ALT 27 23  ALKPHOS 157* 148*  BILITOT 0.2* 0.8  PROT 5.8* 6.3  ALBUMIN 2.8* 2.8*    Recent Labs Lab 07/29/14 0847  LIPASE 22   CBC:  Recent Labs Lab 07/28/14 1440 07/29/14 0847  WBC 12.4* 12.1*  NEUTROABS 9.0*  --   HGB 7.3* 9.3*  HCT  24.3* 29.7*  MCV 72.1* 74.1*  PLT 575* 507*    Scheduled Meds: . [START ON 08/01/2014] pantoprazole (PROTONIX) IV  40 mg Intravenous Q12H  . polyethylene glycol-electrolytes  4,000 mL Oral Once  . sodium chloride  3 mL Intravenous Q12H   Continuous Infusions: . sodium chloride 75 mL/hr at 07/28/14 1922  . sodium chloride    . pantoprozole (PROTONIX) infusion 8 mg/hr (07/29/14 1040)    Marzetta Board, MD [facility] Pager [phone]. If 7 PM - 7 AM, please contact night-coverage at [email or web address], password [facility] 07/29/2014, 10:59 AM  LOS: 1 day

## 2014-07-30 ENCOUNTER — Inpatient Hospital Stay (HOSPITAL_COMMUNITY): Payer: BLUE CROSS/BLUE SHIELD | Admitting: Anesthesiology

## 2014-07-30 ENCOUNTER — Encounter (HOSPITAL_COMMUNITY): Payer: Self-pay | Admitting: *Deleted

## 2014-07-30 ENCOUNTER — Encounter (HOSPITAL_COMMUNITY)
Admission: EM | Disposition: A | Discharge status: Home / Self Care | Payer: Self-pay | Source: Home / Self Care | Attending: Internal Medicine

## 2014-07-30 ENCOUNTER — Inpatient Hospital Stay (HOSPITAL_COMMUNITY): Payer: BLUE CROSS/BLUE SHIELD

## 2014-07-30 DIAGNOSIS — C189 Malignant neoplasm of colon, unspecified: Secondary | ICD-10-CM

## 2014-07-30 DIAGNOSIS — C787 Secondary malignant neoplasm of liver and intrahepatic bile duct: Secondary | ICD-10-CM

## 2014-07-30 DIAGNOSIS — D374 Neoplasm of uncertain behavior of colon: Secondary | ICD-10-CM

## 2014-07-30 DIAGNOSIS — K921 Melena: Secondary | ICD-10-CM

## 2014-07-30 HISTORY — PX: COLONOSCOPY: SHX5424

## 2014-07-30 HISTORY — DX: Malignant neoplasm of colon, unspecified: C78.7

## 2014-07-30 HISTORY — DX: Malignant neoplasm of colon, unspecified: C18.9

## 2014-07-30 HISTORY — PX: ESOPHAGOGASTRODUODENOSCOPY: SHX5428

## 2014-07-30 LAB — CBC
HCT: 27.4 % — ABNORMAL LOW (ref 39.0–52.0)
HEMOGLOBIN: 8.5 g/dL — AB (ref 13.0–17.0)
MCH: 22.9 pg — ABNORMAL LOW (ref 26.0–34.0)
MCHC: 31 g/dL (ref 30.0–36.0)
MCV: 73.9 fL — AB (ref 78.0–100.0)
Platelets: 490 10*3/uL — ABNORMAL HIGH (ref 150–400)
RBC: 3.71 MIL/uL — AB (ref 4.22–5.81)
RDW: 16.6 % — ABNORMAL HIGH (ref 11.5–15.5)
WBC: 11.1 10*3/uL — ABNORMAL HIGH (ref 4.0–10.5)

## 2014-07-30 LAB — TYPE AND SCREEN
ABO/RH(D): A POS
ANTIBODY SCREEN: NEGATIVE
Unit division: 0

## 2014-07-30 LAB — BASIC METABOLIC PANEL
Anion gap: 7 (ref 5–15)
BUN: 10 mg/dL (ref 6–23)
CALCIUM: 8.4 mg/dL (ref 8.4–10.5)
CO2: 24 mmol/L (ref 19–32)
Chloride: 105 mmol/L (ref 96–112)
Creatinine, Ser: 0.64 mg/dL (ref 0.50–1.35)
Glucose, Bld: 90 mg/dL (ref 70–99)
POTASSIUM: 4 mmol/L (ref 3.5–5.1)
Sodium: 136 mmol/L (ref 135–145)

## 2014-07-30 LAB — HEMOGLOBIN A1C
Hgb A1c MFr Bld: 5.5 % (ref 4.8–5.6)
Mean Plasma Glucose: 111 mg/dL

## 2014-07-30 SURGERY — EGD (ESOPHAGOGASTRODUODENOSCOPY)
Anesthesia: Monitor Anesthesia Care

## 2014-07-30 MED ORDER — IOHEXOL 300 MG/ML  SOLN
50.0000 mL | Freq: Once | INTRAMUSCULAR | Status: AC | PRN
  Administered 2014-07-30: 50 mL via ORAL

## 2014-07-30 MED ORDER — PROPOFOL 10 MG/ML IV BOLUS
INTRAVENOUS | Status: AC
  Filled 2014-07-30: qty 20

## 2014-07-30 MED ORDER — SPOT INK MARKER SYRINGE KIT
PACK | SUBMUCOSAL | Status: AC
  Filled 2014-07-30: qty 5

## 2014-07-30 MED ORDER — PROPOFOL INFUSION 10 MG/ML OPTIME
INTRAVENOUS | Status: DC | PRN
  Administered 2014-07-30: 140 ug/kg/min via INTRAVENOUS

## 2014-07-30 MED ORDER — LACTATED RINGERS IV SOLN
INTRAVENOUS | Status: DC | PRN
  Administered 2014-07-30: 15:00:00 via INTRAVENOUS

## 2014-07-30 MED ORDER — IOHEXOL 300 MG/ML  SOLN
100.0000 mL | Freq: Once | INTRAMUSCULAR | Status: AC | PRN
  Administered 2014-07-30: 100 mL via INTRAVENOUS

## 2014-07-30 MED ORDER — SPOT INK MARKER SYRINGE KIT
PACK | SUBMUCOSAL | Status: DC | PRN
  Administered 2014-07-30: 1.5 mL via SUBMUCOSAL

## 2014-07-30 MED ORDER — PROPOFOL 10 MG/ML IV BOLUS
INTRAVENOUS | Status: DC | PRN
  Administered 2014-07-30: 50 mg via INTRAVENOUS
  Administered 2014-07-30: 100 mg via INTRAVENOUS

## 2014-07-30 NOTE — Progress Notes (Signed)
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  I was unable to pass the colonoscope beyond the obstruction.  Unfortunately, ultrasound suggests multiple liver metastases.  Recommendations #1 CT the abdomen and pelvis #2 surgical and oncology consults

## 2014-07-30 NOTE — Anesthesia Preprocedure Evaluation (Addendum)
Anesthesia Evaluation  Patient identified by MRN, date of birth, ID band Patient awake    Reviewed: Allergy & Precautions, NPO status , Patient's Chart, lab work & pertinent test results  Airway Mallampati: II  TM Distance: >3 FB Neck ROM: Full    Dental  (+) Missing   Pulmonary neg pulmonary ROS,  breath sounds clear to auscultation        Cardiovascular negative cardio ROS  Rhythm:Regular Rate:Normal     Neuro/Psych negative neurological ROS     GI/Hepatic negative GI ROS, Neg liver ROS,   Endo/Other  negative endocrine ROS  Renal/GU negative Renal ROS     Musculoskeletal negative musculoskeletal ROS (+)   Abdominal   Peds  Hematology  (+) anemia ,   Anesthesia Other Findings   Reproductive/Obstetrics                            Anesthesia Physical Anesthesia Plan  ASA: II  Anesthesia Plan: MAC   Post-op Pain Management:    Induction: Intravenous  Airway Management Planned: Simple Face Mask, Natural Airway and Nasal Cannula  Additional Equipment:   Intra-op Plan:   Post-operative Plan:   Informed Consent: I have reviewed the patients History and Physical, chart, labs and discussed the procedure including the risks, benefits and alternatives for the proposed anesthesia with the patient or authorized representative who has indicated his/her understanding and acceptance.     Plan Discussed with: CRNA  Anesthesia Plan Comments:         Anesthesia Quick Evaluation

## 2014-07-30 NOTE — Progress Notes (Signed)
INITIAL NUTRITION ASSESSMENT  DOCUMENTATION CODES Per approved criteria  -Obesity Unspecified   INTERVENTION: Diet advancement per MD RD to continue to monitor (PO intake)  NUTRITION DIAGNOSIS: Unintentional weight loss related to GI malignancy as evidenced by 22 lb weight loss over the past year.   Goal: Pt to meet >/= 90% of their estimated nutrition needs   Monitor:  Diet advancement, weight, labs, I/O's  Reason for Assessment: Pt identified as at nutrition risk on the Malnutrition Screen Tool  Admitting Dx: GI bleed  ASSESSMENT: 49 year old male admitted on 2/27 with anemia, weight loss, GI bleed, concerning for a GI malignancy.  Pt is NPO for colonoscopy and EGD today. Pt reports good appetite and eating well PTA. Per GI note, pt weighed 210 lb in January 2015 (10% weight loss x >1 year).   Pt declines supplements when diet is advanced, feels that his appetite will stay adequate. RD encouraged pt to consume supplements or high protein snacks if he continues to lose weight after discharge. RD to follow-up when diet is advanced and will monitor PO intake.  Nutrition Focused Physical Exam:  Subcutaneous Fat:  Orbital Region: WNL Upper Arm Region: WNL Thoracic and Lumbar Region: NA  Muscle:  Temple Region: mild depletion Clavicle Bone Region: mild depletion Clavicle and Acromion Bone Region: mild depletion Scapular Bone Region: WNL Dorsal Hand: WNL Patellar Region: WNL Anterior Thigh Region: WNL Posterior Calf Region: WNL  Edema: no LE edema  Labs reviewed: WNL  Height: Ht Readings from Last 1 Encounters:  07/28/14 5\' 6"  (1.676 m)    Weight: Wt Readings from Last 1 Encounters:  07/28/14 188 lb (85.276 kg)    Ideal Body Weight: 142 lb  % Ideal Body Weight: 132%  Wt Readings from Last 10 Encounters:  07/28/14 188 lb (85.276 kg)    Usual Body Weight: 210 lb  % Usual Body Weight: 90%  BMI:  Body mass index is 30.36 kg/(m^2).  Estimated  Nutritional Needs: Kcal: 1800-2000 Protein: 80-90g Fluid: 1.8L/day  Skin: intact  Diet Order: Diet NPO time specified  EDUCATION NEEDS: -No education needs identified at this time   Intake/Output Summary (Last 24 hours) at 07/30/14 1020 Last data filed at 07/30/14 0644  Gross per 24 hour  Intake 2339.34 ml  Output      0 ml  Net 2339.34 ml    Last BM: 2/29  Labs:   Recent Labs Lab 07/28/14 1440 07/29/14 0847 07/30/14 0602  NA 140 136 136  K 4.0 4.0 4.0  CL 109 105 105  CO2 25 26 24   BUN 18 14 10   CREATININE 0.81 0.72 0.64  CALCIUM 8.3* 8.4 8.4  GLUCOSE 110* 90 90    CBG (last 3)   Recent Labs  07/29/14 1146 07/29/14 1638 07/29/14 2246  GLUCAP 70 96 78    Scheduled Meds: . [START ON 08/01/2014] pantoprazole (PROTONIX) IV  40 mg Intravenous Q12H  . sodium chloride  3 mL Intravenous Q12H    Continuous Infusions: . sodium chloride 20 mL/hr at 07/29/14 1346  . pantoprozole (PROTONIX) infusion 8 mg/hr (07/30/14 0834)    History reviewed. No pertinent past medical history.  History reviewed. No pertinent past surgical history.  Clayton Bibles, MS, RD, LDN Pager: [phone] After Hours Pager: [phone]

## 2014-07-30 NOTE — Transfer of Care (Signed)
Immediate Anesthesia Transfer of Care Note  Patient: Tom [name]  Procedure(s) Performed: Procedure(s) (LRB): ESOPHAGOGASTRODUODENOSCOPY (EGD) (N/A) COLONOSCOPY (N/A)  Patient Location: PACU  Anesthesia Type: MAC  Level of Consciousness: sedated, patient cooperative and responds to stimulation  Airway & Oxygen Therapy: Patient Spontanous Breathing and Patient connected to face mask oxgen  Post-op Assessment: Report given to PACU RN and Post -op Vital signs reviewed and stable  Post vital signs: Reviewed and stable  Complications: No apparent anesthesia complications

## 2014-07-30 NOTE — Op Note (Signed)
[facility] [place], [address]   Sigmoidoscopy PROCEDURE REPORT     EXAM DATE: 08-10-2014  PATIENT NAME:      Tom [name], Tom [name]           MR #: [id number] BIRTHDATE:       [birth year]      VISIT #:     [phone]  ATTENDING:     Inda Castle, MD     STATUS:     inpatient ASSISTANT:      Cletis Athens and Tory Emerald  INDICATIONS:  The patient is a 49 yr old male here for a colonoscopy due to iron deficiency anemia. PROCEDURE PERFORMED:     sigmoidoscopy with biopsy Submucosal injection, any substance MEDICATIONS:     Monitored anesthesia care ESTIMATED BLOOD LOSS:     None  CONSENT: The patient understands the risks and benefits of the procedure and understands that these risks include, but are not limited to: sedation, allergic reaction, infection, perforation and/or bleeding. Alternative means of evaluation and treatment include, among others: physical exam, x-rays, and/or surgical intervention. The patient elects to proceed with this endoscopic procedure.  DESCRIPTION OF PROCEDURE: During intra-op preparation period all mechanical & medical equipment was checked for proper function. Hand hygiene and appropriate measures for infection prevention was taken. After the risks, benefits and alternatives of the procedure were thoroughly explained, Informed consent was verified, confirmed and timeout was successfully executed by the treatment team. A digital exam revealed no abnormalities of the rectum. The Pentax Ped Colon [id number] endoscope was introduced through the anus and advanced to the sigmoid colon.    The instrument was then slowly withdrawn as the colon was fully examined.   COLON FINDINGS: At approximately 12 cm from the anus there was a circumferential obstructing neoplasm.  The 12 mm, scope could not be passed beyond the tumor.  Mucosa was friable.  Multiple biopsies were taken. The colon distal to the tumor was  unremarkable.    The scope was then completely withdrawn from the patient and the procedure terminated. SCOPE WITHDRAWAL TIME:    ADVERSE EVENTS:      There were no immediate complications.  IMPRESSIONS:     obstructing neoplasm, sigmoid colon  RECOMMENDATIONS:     1.  CT scan of abdomen and pelvis. 2.  surgery Consult Note RECALL:  _____________________________ Inda Castle, MD eSigned:  Inda Castle, MD 08/10/2014 5:15 PM   cc:   CPT CODES: ICD CODES:  The ICD and CPT codes recommended by this software are interpretations from the data that the clinical staff has captured with the software.  The verification of the translation of this report to the ICD and CPT codes and modifiers is the sole responsibility of the health care institution and practicing physician where this report was generated.  [employer] will not be held responsible for the validity of the ICD and CPT codes included on this report.  AMA assumes no liability for data contained or not contained herein. CPT is a Designer, television/film set of the [employer].   PATIENT NAME:  Tom [name] MR#: [id number]

## 2014-07-30 NOTE — Progress Notes (Signed)
PROGRESS NOTE  Tom HALIBURTON HYQ:[id number] DOB: 1966-01-22 DOA: 07/28/2014 PCP: No primary care provider on file.  HPI: Tom [name] is a 49 yo white male who went to an urgent care clinic for lack of energy, abdominal pain and pallor. Urgent care told him to go to the ER due to a low hemoglobin (7.3). He noticed blood in his stool in October. Daily bowel movements are soft green/brown color. Over the past year, pt states he has had a 20 pound unintentional weight loss. Denies fever, chest pain, night sweats, syncope. Denies FH of colon cancer.   Subjective: Continues to feel better since the blood transfusion on 2/27. Denies nausea, vomiting, hematochezia, abdominal pain, malaise.   Assessment/Plan: GI Bleed - Continued weakness, unintentional weight loss, hematochezia  - [name] on 2/28 show multiple hepatic masses suspicious for mets - Current Hemoglobin 8.5  - EGD and colonoscopy today per GI - Continue Protonix   Microcytic Anemia - Current Hb 8.5, MCV 73.9; asymptomatic since transfusion on 2/27 - Mostly likely d/t #1 - Transfuse if Hb below 8  - On discharge, begin iron supplementation   DVT Prophylaxis:  SCDs   Code Status: Full  Family Communication: Discussed with patient Disposition Plan: Inpatient  Consultants:  GI  Procedures:  Will have EGD and Colonoscopy today   Antibiotics:  None  Objective: Filed Vitals:   07/29/14 0653 07/29/14 1348 07/29/14 2100 07/30/14 0643  BP: 122/73 135/78 112/72 115/72  Pulse: 67 76 77 75  Temp: 98.6 F (37 C) 98.1 F (36.7 C) 98.2 F (36.8 C) 98.2 F (36.8 C)  TempSrc: Oral Oral Oral Oral  Resp: 18 18 18 18   Height:      Weight:      SpO2: 100% 100% 99% 99%    Intake/Output Summary (Last 24 hours) at 07/30/14 1328 Last data filed at 07/30/14 0900  Gross per 24 hour  Intake 2339.34 ml  Output      0 ml  Net 2339.34 ml   Filed Weights   07/28/14 1413  Weight: 85.276 kg (188 lb)   Exam: General:  Well developed, well nourished, NAD, appears stated age  31:  Anicteic Sclera, MMM.  Neck: Supple, no JVD, no masses  Cardiovascular: RRR, S1 S2 auscultated, no rubs, murmurs or gallops.   Respiratory: Clear to auscultation bilaterally with equal chest rise  Abdomen: Soft, nontender, nondistended, + bowel sounds  Extremities: warm dry without cyanosis clubbing or edema.  Neuro: AAOx3, cranial nerves grossly intact.  Skin: Without rashes exudates or nodules.   Psych: Normal affect and demeanor with intact judgement and insight   Data Reviewed: Basic Metabolic Panel:  Recent Labs Lab 07/28/14 1440 07/29/14 0847 07/30/14 0602  NA 140 136 136  K 4.0 4.0 4.0  CL 109 105 105  CO2 25 26 24   GLUCOSE 110* 90 90  BUN 18 14 10   CREATININE 0.81 0.72 0.64  CALCIUM 8.3* 8.4 8.4   Liver Function Tests:  Recent Labs Lab 07/28/14 1440 07/29/14 0847  AST 45* 41*  ALT 27 23  ALKPHOS 157* 148*  BILITOT 0.2* 0.8  PROT 5.8* 6.3  ALBUMIN 2.8* 2.8*    Recent Labs Lab 07/29/14 0847  LIPASE 22   CBC:  Recent Labs Lab 07/28/14 1440 07/29/14 0847 07/30/14 0602  WBC 12.4* 12.1* 11.1*  NEUTROABS 9.0*  --   --   HGB 7.3* 9.3* 8.5*  HCT 24.3* 29.7* 27.4*  MCV 72.1* 74.1* 73.9*  PLT 575* 507*  490*   CBG:  Recent Labs Lab 07/29/14 1146 07/29/14 1638 07/29/14 2246  GLUCAP 70 96 78    Studies: US Abdomen Complete  07/29/2014   CLINICAL DATA:  Abdominal pain.  EXAM: ULTRASOUND ABDOMEN COMPLETE  COMPARISON:  None.  FINDINGS: Gallbladder: No gallstones or wall thickening visualized. No sonographic Murphy sign noted.  Common bile duct: Diameter: 3.9 mm, normal  Liver: Multiple heterogeneous mass lesions are demonstrated throughout the liver, some with central cavitation. 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 Suggest CT for further evaluation.  IVC: No abnormality visualized.  Pancreas: Pancreas is  incompletely visualized due to overlying bowel gas. Visualized head and neck appear normal.  Spleen: Size and appearance within normal limits.  Right Kidney: Length: 12.4 cm. Echogenicity within normal limits. No mass or hydronephrosis visualized.  Left Kidney: Length: 13.2 cm. Simple appearing cyst in the upper pole of the left kidney measuring 3.3 cm diameter. No hydronephrosis or solid mass identified.  Abdominal aorta: No aneurysm visualized.  Other findings: None.  IMPRESSION: Multiple heterogeneous mass lesions throughout the liver, some with central cavitation. 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   Electronically Signed   By: Lucienne Capers M.D.   On: 07/29/2014 00:28    Scheduled Meds: . [START ON 08/01/2014] pantoprazole (PROTONIX) IV  40 mg Intravenous Q12H  . sodium chloride  3 mL Intravenous Q12H   Continuous Infusions: . sodium chloride 20 mL/hr at 07/29/14 1346  . pantoprozole (PROTONIX) infusion 8 mg/hr (07/30/14 0834)    Principal Problem:   GI bleed Active Problems:   Microcytic anemia   Abdominal pain   Leukocytosis   Epidermoid cyst    Foye Spurling, PA-S [facility] 07/30/2014, 1:28 PM    Sashia Campas M. Cruzita Lederer, MD [facility] [phone]

## 2014-07-30 NOTE — Progress Notes (Signed)
Patient ID: Tom [name], male   DOB: 1965-11-09, 49 y.o.   MRN: [id number]    Progress Note   Subjective   Tolerated prep without difficulty, no c/o bleeding. No abdominal pain. Questions answered regarding procedures   Objective   Vital signs in last 24 hours: Temp:  [98.1 F (36.7 C)-98.2 F (36.8 C)] 98.2 F (36.8 C) (02/29 0643) Pulse Rate:  [75-77] 75 (02/29 0643) Resp:  [18] 18 (02/29 0643) BP: (112-135)/(72-78) 115/72 mmHg (02/29 0643) SpO2:  [99 %-100 %] 99 % (02/29 0643) Last BM Date: 07/29/14 General:    white male  in NAD Heart:  Regular rate and rhythm; no murmurs Lungs: Respirations even and unlabored, lungs CTA bilaterally Abdomen:  Soft, nontender and nondistended. Normal bowel sounds. Extremities:  Without edema. Neurologic:  Alert and oriented,  grossly normal neurologically. Psych:  Cooperative. Normal mood and affect.  Intake/Output from previous day: 02/28 0701 - 02/29 0700 In: 2339.3 [P.O.:1000; I.V.:939.3] Out: -  Intake/Output this shift:    Lab Results:  Recent Labs  07/28/14 1440 07/29/14 0847 07/30/14 0602  WBC 12.4* 12.1* 11.1*  HGB 7.3* 9.3* 8.5*  HCT 24.3* 29.7* 27.4*  PLT 575* 507* 490*   BMET  Recent Labs  07/28/14 1440 07/29/14 0847 07/30/14 0602  NA 140 136 136  K 4.0 4.0 4.0  CL 109 105 105  CO2 25 26 24   GLUCOSE 110* 90 90  BUN 18 14 10   CREATININE 0.81 0.72 0.64  CALCIUM 8.3* 8.4 8.4   LFT  Recent Labs  07/29/14 0847  PROT 6.3  ALBUMIN 2.8*  AST 41*  ALT 23  ALKPHOS 148*  BILITOT 0.8   PT/INR  Recent Labs  07/28/14 1440  LABPROT 13.4  INR 1.01    Studies/Results: US Abdomen Complete  07/29/2014   CLINICAL DATA:  Abdominal pain.  EXAM: ULTRASOUND ABDOMEN COMPLETE  COMPARISON:  None.  FINDINGS: Gallbladder: No gallstones or wall thickening visualized. No sonographic Murphy sign noted.  Common bile duct: Diameter: 3.9 mm, normal  Liver: Multiple heterogeneous mass lesions are demonstrated  throughout the liver, some with central cavitation. 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 Suggest CT for further evaluation.  IVC: No abnormality visualized.  Pancreas: Pancreas is incompletely visualized due to overlying bowel gas. Visualized head and neck appear normal.  Spleen: Size and appearance within normal limits.  Right Kidney: Length: 12.4 cm. Echogenicity within normal limits. No mass or hydronephrosis visualized.  Left Kidney: Length: 13.2 cm. Simple appearing cyst in the upper pole of the left kidney measuring 3.3 cm diameter. No hydronephrosis or solid mass identified.  Abdominal aorta: No aneurysm visualized.  Other findings: None.  IMPRESSION: Multiple heterogeneous mass lesions throughout the liver, some with central cavitation. 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   Electronically Signed   By: Lucienne Capers M.D.   On: 07/29/2014 00:28       Assessment / Plan:   #1  49 yo male with weight loss ,weakness, anemia. Unfortunately US shows multiple hepatic masses concerning for diffuse  metastatic disease. R/o underlying colon or gastric cancer . Foe Colonoscopy and EGD today. I did not discuss US findings with pt Further plans pending endoscopic  findings  #2 anemia- stable- will keep hgb at least 8  Principal Problem:   GI bleed Active Problems:   Microcytic anemia   Abdominal pain   Leukocytosis  Epidermoid cyst     LOS: 2 days   [name] Esterwood  07/30/2014, 8:40 AM  GI Attending Note  I have personally taken an interval history, reviewed the chart, and examined the patient.  I agree with the extender's note, impression and recommendations.  Sandy Salaam. Deatra Ina, MD, [facility] [phone]

## 2014-07-30 NOTE — Discharge Instructions (Signed)
Colonoscopy, Care After °These instructions give you information on caring for yourself after your procedure. Your doctor may also give you more specific instructions. Call your doctor if you have any problems or questions after your procedure. °HOME CARE °· Do not drive for 24 hours. °· Do not sign important papers or use machinery for 24 hours. °· You may shower. °· You may go back to your usual activities, but go slower for the first 24 hours. °· Take rest breaks often during the first 24 hours. °· Walk around or use warm packs on your belly (abdomen) if you have belly cramping or gas. °· Drink enough fluids to keep your pee (urine) clear or pale yellow. °· Resume your normal diet. Avoid heavy or fried foods. °· Avoid drinking alcohol for 24 hours or as told by your doctor. °· Only take medicines as told by your doctor. °If a tissue sample (biopsy) was taken during the procedure:  °· Do not take aspirin or blood thinners for 7 days, or as told by your doctor. °· Do not drink alcohol for 7 days, or as told by your doctor. °· Eat soft foods for the first 24 hours. °GET HELP IF: °You still have a small amount of blood in your poop (stool) 2-3 days after the procedure. °GET HELP RIGHT AWAY IF: °· You have more than a small amount of blood in your poop. °· You see clumps of tissue (blood clots) in your poop. °· Your belly is puffy (swollen). °· You feel sick to your stomach (nauseous) or throw up (vomit). °· You have a fever. °· You have belly pain that gets worse and medicine does not help. °MAKE SURE YOU: °· Understand these instructions. °· Will watch your condition. °· Will get help right away if you are not doing well or get worse. °Document Released: 06/20/2010 Document Revised: 05/23/2013 Document Reviewed: 01/23/2013 °[employer]® Patient Information ©2015 [employer]. This information is not intended to replace advice given to you by your health care provider. Make sure you discuss any questions you have with  your health care provider. ° °

## 2014-07-30 NOTE — Anesthesia Postprocedure Evaluation (Signed)
  Anesthesia Post-op Note  Patient: Tom [name]  Procedure(s) Performed: Procedure(s) (LRB): ESOPHAGOGASTRODUODENOSCOPY (EGD) (N/A) COLONOSCOPY (N/A)  Patient Location: PACU  Anesthesia Type: MAC  Level of Consciousness: awake and alert   Airway and Oxygen Therapy: Patient Spontanous Breathing  Post-op Pain: mild  Post-op Assessment: Post-op Vital signs reviewed, Patient's Cardiovascular Status Stable, Respiratory Function Stable, Patent Airway and No signs of Nausea or vomiting  Last Vitals:  Filed Vitals:   07/30/14 1715  BP: 94/41  Pulse: 75  Temp:   Resp: 22    Post-op Vital Signs: stable   Complications: No apparent anesthesia complications

## 2014-07-31 ENCOUNTER — Inpatient Hospital Stay (HOSPITAL_COMMUNITY): Payer: BLUE CROSS/BLUE SHIELD

## 2014-07-31 ENCOUNTER — Encounter (HOSPITAL_COMMUNITY): Payer: Self-pay | Admitting: Gastroenterology

## 2014-07-31 ENCOUNTER — Other Ambulatory Visit (HOSPITAL_COMMUNITY): Payer: Self-pay | Admitting: Oncology

## 2014-07-31 DIAGNOSIS — C787 Secondary malignant neoplasm of liver and intrahepatic bile duct: Secondary | ICD-10-CM

## 2014-07-31 DIAGNOSIS — C187 Malignant neoplasm of sigmoid colon: Principal | ICD-10-CM

## 2014-07-31 DIAGNOSIS — M7989 Other specified soft tissue disorders: Secondary | ICD-10-CM

## 2014-07-31 DIAGNOSIS — D500 Iron deficiency anemia secondary to blood loss (chronic): Secondary | ICD-10-CM

## 2014-07-31 LAB — CBC
HCT: 27.4 % — ABNORMAL LOW (ref 39.0–52.0)
HEMOGLOBIN: 8.7 g/dL — AB (ref 13.0–17.0)
MCH: 23.4 pg — ABNORMAL LOW (ref 26.0–34.0)
MCHC: 31.8 g/dL (ref 30.0–36.0)
MCV: 73.7 fL — ABNORMAL LOW (ref 78.0–100.0)
Platelets: 478 10*3/uL — ABNORMAL HIGH (ref 150–400)
RBC: 3.72 MIL/uL — AB (ref 4.22–5.81)
RDW: 17.1 % — ABNORMAL HIGH (ref 11.5–15.5)
WBC: 9.8 10*3/uL (ref 4.0–10.5)

## 2014-07-31 MED ORDER — CEFAZOLIN SODIUM-DEXTROSE 2-3 GM-% IV SOLR
2.0000 g | INTRAVENOUS | Status: AC
  Administered 2014-08-01: 2 g via INTRAVENOUS
  Filled 2014-07-31: qty 50

## 2014-07-31 MED ORDER — IOHEXOL 300 MG/ML  SOLN
80.0000 mL | Freq: Once | INTRAMUSCULAR | Status: AC | PRN
  Administered 2014-07-31: 80 mL via INTRAVENOUS

## 2014-07-31 MED ORDER — FERROUS SULFATE 325 (65 FE) MG PO TABS
325.0000 mg | ORAL_TABLET | Freq: Two times a day (BID) | ORAL | Status: DC
  Administered 2014-07-31 – 2014-08-01 (×3): 325 mg via ORAL
  Filled 2014-07-31 (×3): qty 1

## 2014-07-31 NOTE — Progress Notes (Signed)
Progress Note   Subjective     Objective  Vital signs in last 24 hours: Temp:  [97.4 F (36.3 C)-98.6 F (37 C)] 98.6 F (37 C) (03/01 0518) Pulse Rate:  [66-76] 66 (03/01 0518) Resp:  [16-26] 20 (03/01 0518) BP: (94-130)/(41-75) 120/73 mmHg (03/01 0518) SpO2:  [99 %-100 %] 99 % (03/01 0518) Last BM Date: 07/31/14 General:   Alert,  Well-developed,   in NAD Heart:  Regular rate and rhythm; no murmurs   Alert and cooperative. Normal mood and affect.  Intake/Output from previous day: 02/29 0701 - 03/01 0700 In: 1985.3 [P.O.:450; I.V.:1535.3] Out: -  Intake/Output this shift: Total I/O In: 120 [P.O.:120] Out: -   Lab Results:  Recent Labs  07/29/14 0847 07/30/14 0602 07/31/14 0551  WBC 12.1* 11.1* 9.8  HGB 9.3* 8.5* 8.7*  HCT 29.7* 27.4* 27.4*  PLT 507* 490* 478*   BMET  Recent Labs  07/28/14 1440 07/29/14 0847 07/30/14 0602  NA 140 136 136  K 4.0 4.0 4.0  CL 109 105 105  CO2 25 26 24   GLUCOSE 110* 90 90  BUN 18 14 10   CREATININE 0.81 0.72 0.64  CALCIUM 8.3* 8.4 8.4   LFT  Recent Labs  07/29/14 0847  PROT 6.3  ALBUMIN 2.8*  AST 41*  ALT 23  ALKPHOS 148*  BILITOT 0.8   PT/INR  Recent Labs  07/28/14 1440  LABPROT 13.4  INR 1.01   Hepatitis Panel No results for input(s): HEPBSAG, HCVAB, HEPAIGM, HEPBIGM in the last 72 hours.  Studies/Results: Ct Abdomen Pelvis W Contrast  07/31/2014   CLINICAL DATA:  49 year old male with abdominal pain, pallor and lack of energy. History of blood in stools noted in October 2015. 20 pounds unintentional weight loss over the past year.  EXAM: CT ABDOMEN AND PELVIS WITH CONTRAST  TECHNIQUE: Multidetector CT imaging of the abdomen and pelvis was performed using the standard protocol following bolus administration of intravenous contrast.  CONTRAST:  101mL OMNIPAQUE IOHEXOL 300 MG/ML SOLN, 156mL OMNIPAQUE IOHEXOL 300 MG/ML SOLN  COMPARISON:  No priors.  FINDINGS: Lower chest: Some thickening of the  peribronchovascular interstitium throughout the right middle lobe and ill-defined central airspace disease is noted in the right middle lobe, incompletely visualized.  Hepatobiliary: Numerous large hypovascular liver lesions concerning for widespread metastatic disease. The largest of these include an 8.2 x 6.7 x 7.7 cm lesion in segment 4B (image 38 of series 2), and a 7.0 x 8.9 x 7.6 cm lesion centered predominantly in segment 8 (image 18 of series 2). Numerous other smaller lesions are also noted. No intra or extrahepatic biliary ductal dilatation. Gallbladder is largely decompressed, but otherwise unremarkable in appearance. Liver is enlarged measuring 24.8 cm in craniocaudal span.  Pancreas: No pancreatic mass. No pancreatic or peripancreatic fluid collection.  Spleen: Unremarkable.  Adrenals/Urinary Tract: Bilateral adrenal glands are normal in appearance. 2.3 cm low-attenuation lesion in the lateral interpolar region of the left kidney is compatible with a simple cyst. In the upper pole of the left kidney there is an exophytic 4.0 cm low-attenuation lesion, however, this lesion demonstrates high attenuation material on the delayed phase, compatible with a large calyceal diverticulum. Other smaller sub cm low-attenuation lesions in the left kidney are too small to definitively characterize, but are favored to represent tiny cysts. Right kidney is normal in appearance. No hydroureteronephrosis. Urinary bladder is normal in appearance.  Stomach/Bowel: In the mid to distal sigmoid colon there is also rather diffuse wall  thickening, which is asymmetrically increased along the superior margin of the colon, best appreciated on coronal image 85 of series 602, where this abnormal soft tissue thickening appears to extend up into the mid mesocolon along the course of the inferior mesenteric artery where there is lymphadenopathy. This mass like soft tissue thickening is irregular in shape and therefore difficult to  measure, however, this is estimated to measure approximately 4.2 x 3.6 x 3.9 cm (images 60 of series 2 and image 84 of series 602). Normal appearance of the stomach. No pathologic dilatation of small bowel or colon. Diffuse thickening of the rectum.  Vascular/Lymphatic: Mild atherosclerosis throughout the abdominal and pelvic vasculature, without evidence of aneurysm or dissection. Lymphadenopathy is noted in the sigmoid mesocolon along the course of the inferior mesenteric artery where a lymph node measures up to 1 cm in short axis. Multiple borderline enlarged retroperitoneal lymph nodes are also noted. In addition, several other mesenteric lymph nodes are noted, largest of which are in the ileocolic distribution, measuring up to 1.5 cm in short axis (image 52 of series 2).  Reproductive: Prostate gland and seminal vesicles are normal in appearance.  Other: No significant volume of ascites.  No pneumoperitoneum.  Musculoskeletal: There are no aggressive appearing lytic or blastic lesions noted in the visualized portions of the skeleton.  IMPRESSION: 1. Findings, as above, concerning for primary colorectal neoplasm in the mid to distal sigmoid colon, with lymphadenopathy in the sigmoid mesocolon, ileocolic mesentery, and retroperitoneum, as well as widespread metastatic disease to the liver, as detailed above. 2. Central airspace consolidation in the right middle lobe with septal thickening throughout the right middle lobe. These findings are nonspecific, and incompletely visualized, but raise concern for a centrally obstructing lesion (potentially a centrally obstructing metastasis and). Consider further evaluation of the chest with contrast enhanced chest CT in the near future to better evaluate this finding. 3. Multiple left renal lesions appear compatible with simple cysts and a large calyceal diverticulum, as above. 4. Additional incidental findings, as above.   Electronically Signed   By: Vinnie Langton  M.D.   On: 07/31/2014 08:10      Assessment & Plan  *CT confirms presence of distal sigmoid neoplasm, regional lymphadenopathy, and multiple liver metastases.  Fortunately, despite findings of obstruction by colonoscopy, patient is not clinically obstructed.  Accordingly, there is no urgent need for surgery.  Hopefully there will be tumor regression with chemotherapy.  The patient will eventually need a full colonoscopy to rule out synchronous lesions in the more proximal colon.**  Principal Problem:   GI bleed Active Problems:   Microcytic anemia   Abdominal pain   Leukocytosis   Epidermoid cyst   Malignant neoplasm of sigmoid colon  Signing off   LOS: 3 days   Erskine Emery  07/31/2014, 11:48 AM

## 2014-07-31 NOTE — Progress Notes (Signed)
VASCULAR LAB PRELIMINARY  PRELIMINARY  PRELIMINARY  PRELIMINARY  Bilateral lower extremity venous duplex completed.    Preliminary report:  Bilateral:  No evidence of DVT, superficial thrombosis, or Baker's Cyst.   Gaines Cartmell, RVS 07/31/2014, 2:01 PM

## 2014-07-31 NOTE — Progress Notes (Addendum)
New Hematology/Oncology Consult   Referral MD:Dr. Cruzita Lederer       Reason for Referral: Colon Cancer   HPI: Tom [name] presented to an urgent care center with malaise and "pale "color on 07/26/2014. A CBC was obtained and he was noted to have severe anemia. He was referred to the [facility] emergency room for hospital admission. On 07/28/2014 the hemoglobin returned at 7.3 with an MCV of 72.1.  He reports episodes of "dark "stool dating to October 2015. He was transferred to [facility] for a gastroenterology consult. He was taken to a sigmoidoscopy by Dr. Deatra Ina on 07/30/2014. A circumferential obstructing mass was noted at 12 cm from anus. The scope could not pass beyond the tumor. Biopsies were taken. The pathology is pending. Tom [name] reports feeling better after being transfused with 2 units of packed red blood cells.   Past medical history: None     Past Surgical History  Procedure Laterality Date  . Esophagogastroduodenoscopy N/A 07/30/2014    Procedure: ESOPHAGOGASTRODUODENOSCOPY (EGD);  Surgeon: Inda Castle, MD;  Location: [facility];  Service: Endoscopy;  Laterality: N/A;  . Colonoscopy N/A 07/30/2014    Procedure: COLONOSCOPY;  Surgeon: Inda Castle, MD;  Location: [facility];  Service: Endoscopy;  Laterality: N/A;  :   Current facility-administered medications:  .  acetaminophen (TYLENOL) tablet 650 mg, 650 mg, Oral, Q6H PRN, 650 mg at 07/30/14 2011 **OR** acetaminophen (TYLENOL) suppository 650 mg, 650 mg, Rectal, Q6H PRN, Tom Dickens, MD .  gi cocktail (Maalox,Lidocaine,Donnatal), 30 mL, Oral, TID PRN, Tom Dickens, MD .  ondansetron [facility]) tablet 4 mg, 4 mg, Oral, Q6H PRN **OR** ondansetron (ZOFRAN) injection 4 mg, 4 mg, Intravenous, Q6H PRN, Tom Dickens, MD .  pantoprazole (PROTONIX) 80 mg in sodium chloride 0.9 % 250 mL (0.32 mg/mL) infusion, 8 mg/hr, Intravenous, Continuous, Tom Dickens, MD, Last Rate: 25 mL/hr at 07/30/14 2152, 8 mg/hr  at 07/30/14 2152 .  [START ON 08/01/2014] pantoprazole (PROTONIX) injection 40 mg, 40 mg, Intravenous, Q12H, Tom Dickens, MD .  polyethylene glycol (MIRALAX / GLYCOLAX) packet 17 g, 17 g, Oral, Daily PRN, Tom Dickens, MD .  sodium chloride 0.9 % injection 3 mL, 3 mL, Intravenous, Q12H, Tom Dickens, MD, 3 mL at 07/30/14 2200:  . [START ON 08/01/2014] pantoprazole (PROTONIX) IV  40 mg Intravenous Q12H  . sodium chloride  3 mL Intravenous Q12H  :  No Known Allergies:  Family History  Problem Relation Age of Onset  . Cancer  aunts   . Kidney failure Father   : He is not aware of a family history of colon or rectal cancer   History   Social History: Married, lives in [place], works as a Aeronautical engineer, no smoking or alcohol, no transfusion history, no risk factor for HIV or hepatitis.  Review of Systems:  Positives include:20-30 pound weight loss over the past year, dark stools, low back pain for the past year, pale skin, discomfort in the right sacral region  A complete ROS was otherwise negative.   Physical Exam:  Blood pressure 120/73, pulse 66, temperature 98.6 F (37 C), temperature source Oral, resp. rate 20, height 5' 6"  (1.676 m), weight 188 lb (85.276 kg), SpO2 99 %.  HEENT: Oral cavity without visible mass, neck without mass Lungs: Clear bilaterally Cardiac: Regular rate and rhythm Abdomen: The liver is palpable in the right upper abdomen, no spinal megaly, no apparent ascites GU: Testes without mass, the left  testicle is larger than the right side  Vascular: Trace low leg edema bilaterally Lymph nodes: No cervical, supra-clavicular, axillary, or inguinal nodes Neurologic: Alert and oriented, the motor exam appears intact in the upper and lower extremities Skin: No rash Musculoskeletal: No spine tenderness  LABS:   Recent Labs  07/30/14 0602 07/31/14 0551  WBC 11.1* 9.8  HGB 8.5* 8.7*  HCT 27.4* 27.4*  PLT 490* 478*     Recent Labs   07/29/14 0847 07/30/14 0602  NA 136 136  K 4.0 4.0  CL 105 105  CO2 26 24  GLUCOSE 90 90  BUN 14 10  CREATININE 0.72 0.64  CALCIUM 8.4 8.4      RADIOLOGY:  US Abdomen Complete  07/29/2014   CLINICAL DATA:  Abdominal pain.  EXAM: ULTRASOUND ABDOMEN COMPLETE  COMPARISON:  None.  FINDINGS: Gallbladder: No gallstones or wall thickening visualized. No sonographic Murphy sign noted.  Common bile duct: Diameter: 3.9 mm, normal  Liver: Multiple heterogeneous mass lesions are demonstrated throughout the liver, some with central cavitation. 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 Suggest CT for further evaluation.  IVC: No abnormality visualized.  Pancreas: Pancreas is incompletely visualized due to overlying bowel gas. Visualized head and neck appear normal.  Spleen: Size and appearance within normal limits.  Right Kidney: Length: 12.4 cm. Echogenicity within normal limits. No mass or hydronephrosis visualized.  Left Kidney: Length: 13.2 cm. Simple appearing cyst in the upper pole of the left kidney measuring 3.3 cm diameter. No hydronephrosis or solid mass identified.  Abdominal aorta: No aneurysm visualized.  Other findings: None.  IMPRESSION: Multiple heterogeneous mass lesions throughout the liver, some with central cavitation. 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   Electronically Signed   By: Lucienne Capers M.D.   On: 07/29/2014 00:28    Assessment and Plan:   1. Colorectal cancer-obstructing mass noted at 12 cm from the anal verge on a sigmoidoscopy 07/30/2014, pathology pending  CT of the abdomen/pelvis 07/30/2014 consistent with a primary colorectal mass in the distal sigmoid colon with associated lymphadenopathy and widespread liver metastases, central airspace consolidation right middle lobe-nonspecific  2.  Microcytic anemia-likely iron deficiency  anemia secondary to bleeding from the colon mass  Status post a red cell transfusion 07/28/2014  I discussed the diagnosis of metastatic colon cancer and treatment options with Tom [name] and his wife. We reviewed the CT images. No therapy will be curative. Surgery is indicated only for obstruction or overt bleeding. My initial impression is to recommend systemic therapy unless Dr. Redmond Pulling feels surgery is indicated now. Tom [name] does not complain of obstructive symptoms.  He lives in [place] and would like to receive oncology care at [facility]. I will make a referral to Dr. Whitney Muse.  Recommendations: 1. Staging chest CT 2. CEA 3. Consult Dr. Redmond Pulling for Port-A-Cath placement 4. Begin ferrous sulfate 5. Diet per GI and surgery 6. Outpatient follow-up with Dr. Whitney Muse at [facility] 7. Follow-up biopsy pathology and add K-ras/ HNPCC testing  Betsy Coder, MD 07/31/2014, 7:58 AM

## 2014-07-31 NOTE — Consult Note (Signed)
Reason for Consult: obstructing mass sigmoid colon Referring Physician: Dr. Marzetta Board  Tom [name] is an 49 y.o. male.  HPI: 49 y/o who presented to an Urgent Care with lack of energy, mild abdominal pain and a pale color.  He described an upset stomach, worse with food, greenish soft to brown stools with some blood.  25 pound unintentional weight loss.  He was called by the Urgent Care and directed to the ED for possible transfusion with H/H 7.3/24.3.  He was admitted and transfused by the Medicine service.   He has been diagnosed with what sounds like diverticulitis and placed on antibiotics in the past back in October last year for about a month.  He says he felt better, but his wife says he was still having some discomfort after eating.  He was also becoming pale.  He continued on till he was seen by the Urgent care, referred to the Ed and admitted here. He was seen by Dr. Benson Norway after admit here, and rectal exam was positive for blood.  He was started on a Bowel prep and did well with this.  He had EGD and colonoscopy scheduled fon 2/29 by Dr. Deatra Ina. Sigmoidoscopy on 07/30/14 shows:  At approximately 12 cm from the anus there was a circumferential obstructing neoplasm. The 12 mm, scope could not be passed beyond the tumor. Mucosa was friable. Multiple biopsies were taken.  The colon distal to the tumor was unremarkable. CT of the abdomen and pelvis on 05/30/15 shows: mass like soft tissue thickening is irregular in shape and therefore difficult to measure, however, this is estimated to measure approximately 4.2 x 3.6 x 3.9 cm.  Mid to distal sigmoid colon there is also rather diffuse wall thickening, which is asymmetrically increased along the superior margin of the colon; abnormal soft tissue thickening appears to extend up into the mid mesocolon along the course of the inferior mesenteric artery where there is lymphadenopathy.  Numerous hypo vascular liver lesions concerning for  metastatic disease.  The largest of these include an 8.2 x 6.7 x 7.7 cm lesion in segment 4B (image 38 of series 2), and a 7.0 x 8.9 x 7.6 cm lesion centered predominantly in segment 8 (image 18 of series 2). Numerous other smaller lesions are also noted. No intra or extrahepatic biliary ductal dilatation.  We are ask to see.  He has also been seen by Dr. Learta Codding from Oncology.   History reviewed. No pertinent past medical history. None prior to this admit.  His wife says he has never been sick.  Past Surgical History  Procedure Laterality Date  . Esophagogastroduodenoscopy N/A 07/30/2014    Procedure: ESOPHAGOGASTRODUODENOSCOPY (EGD);  Surgeon: Inda Castle, MD;  Location: [facility];  Service: Endoscopy;  Laterality: N/A;  . Colonoscopy N/A 07/30/2014    Procedure: COLONOSCOPY;  Surgeon: Inda Castle, MD;  Location: [facility];  Service: Endoscopy;  Laterality: N/A;    Family History  Problem Relation Age of Onset  . Cancer Mother just died of liver cancer with what sounds like similar presentation.   . Kidney failure Father     Social History:  reports that he has never smoked. He quit smokeless tobacco use about 21 years ago. His smokeless tobacco use included Chew. He reports that he does not drink alcohol or use illicit drugs.  Allergies: No Known Allergies  Medications:  Prior to Admission:  Prescriptions prior to admission  Medication Sig Dispense Refill Last Dose  . ibuprofen (ADVIL,MOTRIN)  200 MG tablet Take 60 mg by mouth every 6 (six) hours as needed for mild pain.   07/27/2014  . Multiple Vitamin (MULTIVITAMIN WITH MINERALS) TABS tablet Take 1 tablet by mouth daily.   07/28/2014   Scheduled: . [START ON 08/01/2014] pantoprazole (PROTONIX) IV  40 mg Intravenous Q12H  . sodium chloride  3 mL Intravenous Q12H   Continuous: . pantoprozole (PROTONIX) infusion 8 mg/hr (07/30/14 2152)   JJH:ERDEYCXKGYJEH **OR** acetaminophen, gi cocktail, ondansetron **OR**  ondansetron (ZOFRAN) IV, polyethylene glycol Anti-infectives    None      Results for orders placed or performed during the hospital encounter of 07/28/14 (from the past 48 hour(s))  Glucose, capillary     Status: None   Collection Time: 07/29/14 11:46 AM  Result Value Ref Range   Glucose-Capillary 70 70 - 99 mg/dL  Glucose, capillary     Status: None   Collection Time: 07/29/14  4:38 PM  Result Value Ref Range   Glucose-Capillary 96 70 - 99 mg/dL  Glucose, capillary     Status: None   Collection Time: 07/29/14 10:46 PM  Result Value Ref Range   Glucose-Capillary 78 70 - 99 mg/dL  CBC     Status: Abnormal   Collection Time: 07/30/14  6:02 AM  Result Value Ref Range   WBC 11.1 (H) 4.0 - 10.5 K/uL   RBC 3.71 (L) 4.22 - 5.81 MIL/uL   Hemoglobin 8.5 (L) 13.0 - 17.0 g/dL   HCT 27.4 (L) 39.0 - 52.0 %   MCV 73.9 (L) 78.0 - 100.0 fL   MCH 22.9 (L) 26.0 - 34.0 pg   MCHC 31.0 30.0 - 36.0 g/dL   RDW 16.6 (H) 11.5 - 15.5 %   Platelets 490 (H) 150 - 400 K/uL  Basic metabolic panel     Status: None   Collection Time: 07/30/14  6:02 AM  Result Value Ref Range   Sodium 136 135 - 145 mmol/L   Potassium 4.0 3.5 - 5.1 mmol/L   Chloride 105 96 - 112 mmol/L   CO2 24 19 - 32 mmol/L   Glucose, Bld 90 70 - 99 mg/dL   BUN 10 6 - 23 mg/dL   Creatinine, Ser 0.64 0.50 - 1.35 mg/dL   Calcium 8.4 8.4 - 10.5 mg/dL   GFR calc non Af Amer >90 >90 mL/min   GFR calc Af Amer >90 >90 mL/min    Comment: (NOTE) The eGFR has been calculated using the CKD EPI equation. This calculation has not been validated in all clinical situations. eGFR's persistently <90 mL/min signify possible Chronic Kidney Disease.    Anion gap 7 5 - 15  CBC     Status: Abnormal   Collection Time: 07/31/14  5:51 AM  Result Value Ref Range   WBC 9.8 4.0 - 10.5 K/uL   RBC 3.72 (L) 4.22 - 5.81 MIL/uL   Hemoglobin 8.7 (L) 13.0 - 17.0 g/dL   HCT 27.4 (L) 39.0 - 52.0 %   MCV 73.7 (L) 78.0 - 100.0 fL   MCH 23.4 (L) 26.0 - 34.0 pg    MCHC 31.8 30.0 - 36.0 g/dL   RDW 17.1 (H) 11.5 - 15.5 %   Platelets 478 (H) 150 - 400 K/uL    Ct Abdomen Pelvis W Contrast  07/31/2014   CLINICAL DATA:  49 year old male with abdominal pain, pallor and lack of energy. History of blood in stools noted in October 2015. 20 pounds unintentional weight loss over the past year.  EXAM: CT ABDOMEN AND PELVIS WITH CONTRAST  TECHNIQUE: Multidetector CT imaging of the abdomen and pelvis was performed using the standard protocol following bolus administration of intravenous contrast.  CONTRAST:  24m OMNIPAQUE IOHEXOL 300 MG/ML SOLN, 1057mOMNIPAQUE IOHEXOL 300 MG/ML SOLN  COMPARISON:  No priors.  FINDINGS: Lower chest: Some thickening of the peribronchovascular interstitium throughout the right middle lobe and ill-defined central airspace disease is noted in the right middle lobe, incompletely visualized.  Hepatobiliary: Numerous large hypovascular liver lesions concerning for widespread metastatic disease. The largest of these include an 8.2 x 6.7 x 7.7 cm lesion in segment 4B (image 38 of series 2), and a 7.0 x 8.9 x 7.6 cm lesion centered predominantly in segment 8 (image 18 of series 2). Numerous other smaller lesions are also noted. No intra or extrahepatic biliary ductal dilatation. Gallbladder is largely decompressed, but otherwise unremarkable in appearance. Liver is enlarged measuring 24.8 cm in craniocaudal span.  Pancreas: No pancreatic mass. No pancreatic or peripancreatic fluid collection.  Spleen: Unremarkable.  Adrenals/Urinary Tract: Bilateral adrenal glands are normal in appearance. 2.3 cm low-attenuation lesion in the lateral interpolar region of the left kidney is compatible with a simple cyst. In the upper pole of the left kidney there is an exophytic 4.0 cm low-attenuation lesion, however, this lesion demonstrates high attenuation material on the delayed phase, compatible with a large calyceal diverticulum. Other smaller sub cm low-attenuation  lesions in the left kidney are too small to definitively characterize, but are favored to represent tiny cysts. Right kidney is normal in appearance. No hydroureteronephrosis. Urinary bladder is normal in appearance.  Stomach/Bowel: In the mid to distal sigmoid colon there is also rather diffuse wall thickening, which is asymmetrically increased along the superior margin of the colon, best appreciated on coronal image 85 of series 602, where this abnormal soft tissue thickening appears to extend up into the mid mesocolon along the course of the inferior mesenteric artery where there is lymphadenopathy. This mass like soft tissue thickening is irregular in shape and therefore difficult to measure, however, this is estimated to measure approximately 4.2 x 3.6 x 3.9 cm (images 60 of series 2 and image 84 of series 602). Normal appearance of the stomach. No pathologic dilatation of small bowel or colon. Diffuse thickening of the rectum.  Vascular/Lymphatic: Mild atherosclerosis throughout the abdominal and pelvic vasculature, without evidence of aneurysm or dissection. Lymphadenopathy is noted in the sigmoid mesocolon along the course of the inferior mesenteric artery where a lymph node measures up to 1 cm in short axis. Multiple borderline enlarged retroperitoneal lymph nodes are also noted. In addition, several other mesenteric lymph nodes are noted, largest of which are in the ileocolic distribution, measuring up to 1.5 cm in short axis (image 52 of series 2).  Reproductive: Prostate gland and seminal vesicles are normal in appearance.  Other: No significant volume of ascites.  No pneumoperitoneum.  Musculoskeletal: There are no aggressive appearing lytic or blastic lesions noted in the visualized portions of the skeleton.  IMPRESSION: 1. Findings, as above, concerning for primary colorectal neoplasm in the mid to distal sigmoid colon, with lymphadenopathy in the sigmoid mesocolon, ileocolic mesentery, and  retroperitoneum, as well as widespread metastatic disease to the liver, as detailed above. 2. Central airspace consolidation in the right middle lobe with septal thickening throughout the right middle lobe. These findings are nonspecific, and incompletely visualized, but raise concern for a centrally obstructing lesion (potentially a centrally obstructing metastasis and). Consider further evaluation of  the chest with contrast enhanced chest CT in the near future to better evaluate this finding. 3. Multiple left renal lesions appear compatible with simple cysts and a large calyceal diverticulum, as above. 4. Additional incidental findings, as above.   Electronically Signed   By: Vinnie Langton M.D.   On: 07/31/2014 08:10    Review of Systems  Constitutional: Positive for weight loss (25 pounds over the last year) and malaise/fatigue. Negative for fever, chills and diaphoresis.  HENT: Negative.        He has allot of dental issues.  Eyes: Negative.   Respiratory: Negative for cough, hemoptysis, sputum production, shortness of breath and wheezing.   Cardiovascular: Negative for chest pain, palpitations, orthopnea, claudication and PND. Leg swelling: he says this is new since he was admitted to the hospital.  Gastrointestinal: Positive for abdominal pain (comes on after eating, but not every time.  Sometimes with bowel movements.) and blood in stool (different answers on this.). Negative for nausea, vomiting, diarrhea, constipation and melena.  Genitourinary: Negative.   Musculoskeletal: Negative.   Skin:       Pale since September   Neurological: Positive for weakness.  Psychiatric/Behavioral: Negative.    Blood pressure 120/73, pulse 66, temperature 98.6 F (37 C), temperature source Oral, resp. rate 20, height _0  (1.676 m), weight 188 lb (85.276 kg), SpO2 99 %. Physical Exam  Constitutional: He is oriented to person, place, and time. He appears well-developed and well-nourished. No distress.   HENT:  Head: Normocephalic and atraumatic.  Nose: Nose normal.  Eyes: Conjunctivae and EOM are normal. Right eye exhibits no discharge. Left eye exhibits no discharge. No scleral icterus.  Neck: Normal range of motion. Neck supple. No JVD present. No tracheal deviation present. No thyromegaly present.  Cardiovascular: Normal rate, regular rhythm, normal heart sounds and intact distal pulses.  Exam reveals no gallop.   No murmur heard. Respiratory: Effort normal and breath sounds normal. No respiratory distress. He has no wheezes. He has no rales. He exhibits no tenderness.  GI: Soft. Bowel sounds are normal. He exhibits no distension and no mass. There is no tenderness. There is no rebound and no guarding.  Musculoskeletal: He exhibits edema (some in both lower legs, more right than left).  Lymphadenopathy:    He has no cervical adenopathy.  Neurological: He is alert and oriented to person, place, and time. No cranial nerve deficit.  Skin: Skin is warm and dry. No rash noted. He is not diaphoretic. No erythema. No pallor.  Psychiatric: He has a normal mood and affect. His behavior is normal. Judgment and thought content normal.    Assessment/Plan:  1.  Sigmoid colon mass with possible metastasis  2.  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   Anemia with recent transfusion  Plan:  Dr. Gearldine Shown note is not available yet but it sounds like he is considering chemotherapy at this point.  Dr. Deatra Ina says he is obstructed on his exam, but pt reports ongoing bowel movements.  I will review with Dr. Redmond Pulling and he will see later today.  Tom [name] 07/31/2014, 10:45 AM

## 2014-07-31 NOTE — Progress Notes (Signed)
PROGRESS NOTE  Tom [name] NFA:[id number] DOB: 03-Aug-1965 DOA: 07/28/2014 PCP: No primary care provider on file.  HPI: Tom [name] is a 49 yo white male who went to an urgent care clinic for lack of energy, abdominal pain and pallor. Urgent care told him to go to the ER due to a low hemoglobin (7.3). He noticed blood in his stool in October. Daily bowel movements are soft green/brown color. Over the past year, pt states he has had a 20 pound unintentional weight loss. Denies fever, chest pain, night sweats, syncope. Denies FH of colon cancer.   Subjective: Continues to feel better. Complains of lower extremity edema and loose stools. Denies nausea, vomiting, hematochezia, abdominal pain, malaise, leg pain.   Assessment/Plan:  Distal Colon Mass - Colonoscopy 2/29 showed a tumor 12cm from anus, unable to pass the scope. 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  - [name] on 2/28 show multiple hepatic masses suspicious for mets, confirmed with CT abdomen pelvis 3/1  Lower Extremity Edema - Edema started today, left leg worse than right - Obtain lower extremity ultrasound to rule out DVT  GI Bleed - Continued weakness, unintentional weight loss, hematochezia  - Likely due to #1 - Current Hemoglobin 8.7   Microcytic Anemia - Current Hb 8.5, MCV 73.9; asymptomatic since transfusion on 2/27 - Mostly likely d/t #1 - Transfuse if Hb below 8  - On discharge, begin iron supplementation   DVT Prophylaxis:  SCDs   Code Status: Full  Family Communication: Discussed with patient Disposition Plan: Inpatient  Consultants:  GI  Oncology   Surgery  Procedures:  Colonoscopy 2/29: showed a tumor 12cm from anus, unable to pass the scope   Antibiotics:  None  Objective: Filed Vitals:   07/30/14 1730 07/30/14 1740 07/30/14 2137 07/31/14 0518  BP: 130/71  123/75 120/73  Pulse: 73 76 72 66  Temp:   98.3  F (36.8 C) 98.6 F (37 C)  TempSrc:   Oral Oral  Resp: 16 24 20 20   Height:      Weight:      SpO2: 100% 100% 99% 99%    Intake/Output Summary (Last 24 hours) at 07/31/14 1239 Last data filed at 07/31/14 0800  Gross per 24 hour  Intake 2105.33 ml  Output      0 ml  Net 2105.33 ml   Filed Weights   07/28/14 1413  Weight: 85.276 kg (188 lb)   Exam: General: Well developed, well nourished, NAD, appears stated age  HEENT:  Anicteic Sclera, MMM.  Neck: Supple, no JVD, no masses  Cardiovascular: RRR, S1 S2 auscultated, no rubs, murmurs or gallops.  1+ LLE, L>R Respiratory: Clear to auscultation bilaterally with equal chest rise  Abdomen: Soft, nontender, nondistended, + bowel sounds  Extremities: warm dry without cyanosis clubbing Neuro: Alert, cranial nerves grossly intact.  Skin: Without rashes exudates or nodules.   Psych: Normal affect and demeanor with intact judgement and insight  Data Reviewed: Basic Metabolic Panel:  Recent Labs Lab 07/28/14 1440 07/29/14 0847 07/30/14 0602  NA 140 136 136  K 4.0 4.0 4.0  CL 109 105 105  CO2 25 26 24   GLUCOSE 110* 90 90  BUN 18 14 10   CREATININE 0.81 0.72 0.64  CALCIUM 8.3* 8.4 8.4   Liver Function Tests:  Recent Labs Lab 07/28/14 1440 07/29/14 0847  AST 45* 41*  ALT 27 23  ALKPHOS 157* 148*  BILITOT 0.2* 0.8  PROT 5.8* 6.3  ALBUMIN 2.8* 2.8*    Recent Labs Lab 07/29/14 0847  LIPASE 22   CBC:  Recent Labs Lab 07/28/14 1440 07/29/14 0847 07/30/14 0602 07/31/14 0551  WBC 12.4* 12.1* 11.1* 9.8  NEUTROABS 9.0*  --   --   --   HGB 7.3* 9.3* 8.5* 8.7*  HCT 24.3* 29.7* 27.4* 27.4*  MCV 72.1* 74.1* 73.9* 73.7*  PLT 575* 507* 490* 478*   CBG:  Recent Labs Lab 07/29/14 1146 07/29/14 1638 07/29/14 2246  GLUCAP 70 96 78    Studies: Ct Abdomen Pelvis W Contrast  07/31/2014   CLINICAL DATA:  49 year old male with abdominal pain, pallor and lack of energy. History of blood in stools noted in October  2015. 20 pounds unintentional weight loss over the past year.  EXAM: CT ABDOMEN AND PELVIS WITH CONTRAST  TECHNIQUE: Multidetector CT imaging of the abdomen and pelvis was performed using the standard protocol following bolus administration of intravenous contrast.  CONTRAST:  87mL OMNIPAQUE IOHEXOL 300 MG/ML SOLN, 169mL OMNIPAQUE IOHEXOL 300 MG/ML SOLN  COMPARISON:  No priors.  FINDINGS: Lower chest: Some thickening of the peribronchovascular interstitium throughout the right middle lobe and ill-defined central airspace disease is noted in the right middle lobe, incompletely visualized.  Hepatobiliary: Numerous large hypovascular liver lesions concerning for widespread metastatic disease. The largest of these include an 8.2 x 6.7 x 7.7 cm lesion in segment 4B (image 38 of series 2), and a 7.0 x 8.9 x 7.6 cm lesion centered predominantly in segment 8 (image 18 of series 2). Numerous other smaller lesions are also noted. No intra or extrahepatic biliary ductal dilatation. Gallbladder is largely decompressed, but otherwise unremarkable in appearance. Liver is enlarged measuring 24.8 cm in craniocaudal span.  Pancreas: No pancreatic mass. No pancreatic or peripancreatic fluid collection.  Spleen: Unremarkable.  Adrenals/Urinary Tract: Bilateral adrenal glands are normal in appearance. 2.3 cm low-attenuation lesion in the lateral interpolar region of the left kidney is compatible with a simple cyst. In the upper pole of the left kidney there is an exophytic 4.0 cm low-attenuation lesion, however, this lesion demonstrates high attenuation material on the delayed phase, compatible with a large calyceal diverticulum. Other smaller sub cm low-attenuation lesions in the left kidney are too small to definitively characterize, but are favored to represent tiny cysts. Right kidney is normal in appearance. No hydroureteronephrosis. Urinary bladder is normal in appearance.  Stomach/Bowel: In the mid to distal sigmoid colon there  is also rather diffuse wall thickening, which is asymmetrically increased along the superior margin of the colon, best appreciated on coronal image 85 of series 602, where this abnormal soft tissue thickening appears to extend up into the mid mesocolon along the course of the inferior mesenteric artery where there is lymphadenopathy. This mass like soft tissue thickening is irregular in shape and therefore difficult to measure, however, this is estimated to measure approximately 4.2 x 3.6 x 3.9 cm (images 60 of series 2 and image 84 of series 602). Normal appearance of the stomach. No pathologic dilatation of small bowel or colon. Diffuse thickening of the rectum.  Vascular/Lymphatic: Mild atherosclerosis throughout the abdominal and pelvic vasculature, without evidence of aneurysm or dissection. Lymphadenopathy is noted in the sigmoid mesocolon along the course of the inferior mesenteric artery where a lymph node measures up to 1 cm in short axis. Multiple borderline enlarged retroperitoneal lymph nodes are also noted. In addition, several other mesenteric lymph nodes are noted, largest of which are in  the ileocolic distribution, measuring up to 1.5 cm in short axis (image 52 of series 2).  Reproductive: Prostate gland and seminal vesicles are normal in appearance.  Other: No significant volume of ascites.  No pneumoperitoneum.  Musculoskeletal: There are no aggressive appearing lytic or blastic lesions noted in the visualized portions of the skeleton.  IMPRESSION: 1. Findings, as above, concerning for primary colorectal neoplasm in the mid to distal sigmoid colon, with lymphadenopathy in the sigmoid mesocolon, ileocolic mesentery, and retroperitoneum, as well as widespread metastatic disease to the liver, as detailed above. 2. Central airspace consolidation in the right middle lobe with septal thickening throughout the right middle lobe. These findings are nonspecific, and incompletely visualized, but raise  concern for a centrally obstructing lesion (potentially a centrally obstructing metastasis and). Consider further evaluation of the chest with contrast enhanced chest CT in the near future to better evaluate this finding. 3. Multiple left renal lesions appear compatible with simple cysts and a large calyceal diverticulum, as above. 4. Additional incidental findings, as above.   Electronically Signed   By: Vinnie Langton M.D.   On: 07/31/2014 08:10    Scheduled Meds: . [START ON 08/01/2014] pantoprazole (PROTONIX) IV  40 mg Intravenous Q12H  . sodium chloride  3 mL Intravenous Q12H   Continuous Infusions: . pantoprozole (PROTONIX) infusion 8 mg/hr (07/30/14 2152)    Principal Problem:   GI bleed Active Problems:   Microcytic anemia   Abdominal pain   Leukocytosis   Epidermoid cyst   Malignant neoplasm of sigmoid colon    Foye Spurling, PA-S [facility] 07/31/2014, 12:39 PM    Deanta Mincey M. Cruzita Lederer, MD [facility] [phone]

## 2014-08-01 ENCOUNTER — Encounter (HOSPITAL_COMMUNITY): Payer: Self-pay | Admitting: Anesthesiology

## 2014-08-01 ENCOUNTER — Inpatient Hospital Stay (HOSPITAL_COMMUNITY): Payer: BLUE CROSS/BLUE SHIELD

## 2014-08-01 ENCOUNTER — Encounter: Payer: Self-pay | Admitting: *Deleted

## 2014-08-01 ENCOUNTER — Inpatient Hospital Stay (HOSPITAL_COMMUNITY): Payer: BLUE CROSS/BLUE SHIELD | Admitting: Anesthesiology

## 2014-08-01 ENCOUNTER — Encounter (HOSPITAL_COMMUNITY)
Admission: EM | Disposition: A | Discharge status: Home / Self Care | Payer: Self-pay | Source: Home / Self Care | Attending: Internal Medicine

## 2014-08-01 HISTORY — PX: OPERATIVE ULTRASOUND: SHX5996

## 2014-08-01 HISTORY — PX: PORTACATH PLACEMENT: SHX2246

## 2014-08-01 LAB — CBC
HCT: 28.8 % — ABNORMAL LOW (ref 39.0–52.0)
Hemoglobin: 9.1 g/dL — ABNORMAL LOW (ref 13.0–17.0)
MCH: 23.1 pg — AB (ref 26.0–34.0)
MCHC: 31.6 g/dL (ref 30.0–36.0)
MCV: 73.1 fL — AB (ref 78.0–100.0)
Platelets: 496 10*3/uL — ABNORMAL HIGH (ref 150–400)
RBC: 3.94 MIL/uL — AB (ref 4.22–5.81)
RDW: 17.1 % — AB (ref 11.5–15.5)
WBC: 9.7 10*3/uL (ref 4.0–10.5)

## 2014-08-01 LAB — CEA: CEA: 48 ng/mL — ABNORMAL HIGH (ref 0.0–4.7)

## 2014-08-01 SURGERY — INSERTION, TUNNELED CENTRAL VENOUS DEVICE, WITH PORT
Anesthesia: Monitor Anesthesia Care

## 2014-08-01 MED ORDER — HEPARIN SOD (PORK) LOCK FLUSH 100 UNIT/ML IV SOLN
INTRAVENOUS | Status: AC
  Filled 2014-08-01: qty 5

## 2014-08-01 MED ORDER — ONDANSETRON HCL 8 MG PO TABS: 8.0000 mg | ORAL_TABLET | Freq: Three times a day (TID) | ORAL | Status: DC | PRN

## 2014-08-01 MED ORDER — DOCUSATE SODIUM 100 MG PO CAPS
100.0000 mg | ORAL_CAPSULE | Freq: Two times a day (BID) | ORAL | Status: DC
  Filled 2014-08-01: qty 1

## 2014-08-01 MED ORDER — LACTATED RINGERS IV SOLN
INTRAVENOUS | Status: DC
  Administered 2014-08-01: 1000 mL via INTRAVENOUS

## 2014-08-01 MED ORDER — PROCHLORPERAZINE MALEATE 10 MG PO TABS: 10.0000 mg | ORAL_TABLET | Freq: Four times a day (QID) | ORAL | Status: DC | PRN

## 2014-08-01 MED ORDER — FENTANYL CITRATE 0.05 MG/ML IJ SOLN
INTRAMUSCULAR | Status: AC
  Filled 2014-08-01: qty 2

## 2014-08-01 MED ORDER — BUPIVACAINE-EPINEPHRINE 0.25% -1:200000 IJ SOLN
INTRAMUSCULAR | Status: DC | PRN
Start: 2014-08-01 — End: 2014-08-01
  Administered 2014-08-01: 10 mL

## 2014-08-01 MED ORDER — MIDAZOLAM HCL 5 MG/5ML IJ SOLN
INTRAMUSCULAR | Status: DC | PRN
  Administered 2014-08-01: 2 mg via INTRAVENOUS

## 2014-08-01 MED ORDER — PROMETHAZINE HCL 25 MG/ML IJ SOLN: 6.2500 mg | INTRAMUSCULAR | Status: DC | PRN

## 2014-08-01 MED ORDER — OXYCODONE-ACETAMINOPHEN 5-325 MG PO TABS: 1.0000 | ORAL_TABLET | ORAL | Status: DC | PRN

## 2014-08-01 MED ORDER — LACTATED RINGERS IV SOLN
INTRAVENOUS | Status: DC | PRN
  Administered 2014-08-01: 10:00:00 via INTRAVENOUS

## 2014-08-01 MED ORDER — FENTANYL CITRATE 0.05 MG/ML IJ SOLN
25.0000 ug | INTRAMUSCULAR | Status: DC | PRN
  Administered 2014-08-01 (×2): 50 ug via INTRAVENOUS

## 2014-08-01 MED ORDER — CEFAZOLIN SODIUM-DEXTROSE 2-3 GM-% IV SOLR
INTRAVENOUS | Status: AC
  Filled 2014-08-01: qty 50

## 2014-08-01 MED ORDER — HEPARIN SOD (PORK) LOCK FLUSH 100 UNIT/ML IV SOLN
INTRAVENOUS | Status: DC | PRN
  Administered 2014-08-01: 500 [IU] via INTRAVENOUS

## 2014-08-01 MED ORDER — FERROUS SULFATE 325 (65 FE) MG PO TABS: 325.0000 mg | ORAL_TABLET | Freq: Two times a day (BID) | ORAL | Status: DC

## 2014-08-01 MED ORDER — LIDOCAINE HCL (CARDIAC) 20 MG/ML IV SOLN
INTRAVENOUS | Status: DC | PRN
  Administered 2014-08-01: 60 mg via INTRAVENOUS

## 2014-08-01 MED ORDER — FENTANYL CITRATE 0.05 MG/ML IJ SOLN
INTRAMUSCULAR | Status: DC | PRN
  Administered 2014-08-01 (×2): 25 ug via INTRAVENOUS
  Administered 2014-08-01: 50 ug via INTRAVENOUS

## 2014-08-01 MED ORDER — POLYETHYLENE GLYCOL 3350 17 G PO PACK: 17.0000 g | PACK | Freq: Every day | ORAL | Status: DC | PRN

## 2014-08-01 MED ORDER — SODIUM CHLORIDE 0.9 % IR SOLN
Freq: Once | Status: AC
  Administered 2014-08-01: 11:00:00
  Filled 2014-08-01: qty 1.2

## 2014-08-01 MED ORDER — BUPIVACAINE-EPINEPHRINE 0.25% -1:200000 IJ SOLN
INTRAMUSCULAR | Status: AC
  Filled 2014-08-01: qty 1

## 2014-08-01 MED ORDER — PROPOFOL 10 MG/ML IV BOLUS
INTRAVENOUS | Status: AC
  Filled 2014-08-01: qty 20

## 2014-08-01 MED ORDER — DOCUSATE SODIUM 100 MG PO CAPS: 100.0000 mg | ORAL_CAPSULE | Freq: Two times a day (BID) | ORAL | Status: DC

## 2014-08-01 MED ORDER — LIDOCAINE HCL (CARDIAC) 20 MG/ML IV SOLN
INTRAVENOUS | Status: AC
  Filled 2014-08-01: qty 5

## 2014-08-01 MED ORDER — ONDANSETRON HCL 4 MG/2ML IJ SOLN
INTRAMUSCULAR | Status: AC
  Filled 2014-08-01: qty 2

## 2014-08-01 MED ORDER — PROPOFOL 10 MG/ML IV BOLUS
INTRAVENOUS | Status: DC | PRN
  Administered 2014-08-01: 200 mg via INTRAVENOUS

## 2014-08-01 MED ORDER — MIDAZOLAM HCL 2 MG/2ML IJ SOLN
INTRAMUSCULAR | Status: AC
  Filled 2014-08-01: qty 2

## 2014-08-01 MED ORDER — LIDOCAINE-PRILOCAINE 2.5-2.5 % EX CREA: TOPICAL_CREAM | CUTANEOUS | Status: DC

## 2014-08-01 MED ORDER — ONDANSETRON HCL 4 MG/2ML IJ SOLN
INTRAMUSCULAR | Status: DC | PRN
Start: 2014-08-01 — End: 2014-08-01
  Administered 2014-08-01: 4 mg via INTRAVENOUS

## 2014-08-01 MED ORDER — MORPHINE SULFATE 2 MG/ML IJ SOLN: 1.0000 mg | INTRAMUSCULAR | Status: DC | PRN

## 2014-08-01 SURGICAL SUPPLY — 37 items
BAG DECANTER FOR FLEXI CONT (MISCELLANEOUS) ×3 IMPLANT
BENZOIN TINCTURE PRP APPL 2/3 (GAUZE/BANDAGES/DRESSINGS) IMPLANT
BLADE SURG 15 STRL LF DISP TIS (BLADE) ×1 IMPLANT
BLADE SURG 15 STRL SS (BLADE) ×2
CLOSURE WOUND 1/2 X4 (GAUZE/BANDAGES/DRESSINGS)
DECANTER SPIKE VIAL GLASS SM (MISCELLANEOUS) ×3 IMPLANT
DRAPE C-ARM 42X120 X-RAY (DRAPES) ×3 IMPLANT
DRAPE LAPAROSCOPIC ABDOMINAL (DRAPES) ×3 IMPLANT
DRAPE UTILITY XL STRL (DRAPES) IMPLANT
DRSG TEGADERM 6X8 (GAUZE/BANDAGES/DRESSINGS) IMPLANT
ELECT REM PT RETURN 9FT ADLT (ELECTROSURGICAL) ×3
ELECTRODE REM PT RTRN 9FT ADLT (ELECTROSURGICAL) ×1 IMPLANT
GAUZE SPONGE 4X4 12PLY STRL (GAUZE/BANDAGES/DRESSINGS) IMPLANT
GAUZE SPONGE 4X4 16PLY XRAY LF (GAUZE/BANDAGES/DRESSINGS) ×3 IMPLANT
GLOVE BIO SURGEON STRL SZ7.5 (GLOVE) ×3 IMPLANT
GLOVE BIOGEL M STRL SZ7.5 (GLOVE) IMPLANT
GLOVE INDICATOR 8.0 STRL GRN (GLOVE) ×3 IMPLANT
GOWN STRL REUS W/TWL LRG LVL3 (GOWN DISPOSABLE) IMPLANT
GOWN STRL REUS W/TWL XL LVL3 (GOWN DISPOSABLE) ×6 IMPLANT
KIT BASIN OR (CUSTOM PROCEDURE TRAY) ×3 IMPLANT
KIT PORT POWER 8FR ISP CVUE (Catheter) ×3 IMPLANT
LIQUID BAND (GAUZE/BANDAGES/DRESSINGS) ×3 IMPLANT
MARKER SKIN DUAL TIP RULER LAB (MISCELLANEOUS) IMPLANT
NEEDLE HYPO 22GX1.5 SAFETY (NEEDLE) ×3 IMPLANT
NEEDLE HYPO 25X1 1.5 SAFETY (NEEDLE) IMPLANT
NS IRRIG 1000ML POUR BTL (IV SOLUTION) ×3 IMPLANT
PACK BASIC VI WITH GOWN DISP (CUSTOM PROCEDURE TRAY) ×3 IMPLANT
PENCIL BUTTON HOLSTER BLD 10FT (ELECTRODE) ×3 IMPLANT
STRIP CLOSURE SKIN 1/2X4 (GAUZE/BANDAGES/DRESSINGS) IMPLANT
SUT MNCRL AB 4-0 PS2 18 (SUTURE) ×3 IMPLANT
SUT PROLENE 2 0 SH DA (SUTURE) ×3 IMPLANT
SUT VIC AB 3-0 SH 27 (SUTURE) ×2
SUT VIC AB 3-0 SH 27XBRD (SUTURE) ×1 IMPLANT
SYR BULB IRRIGATION 50ML (SYRINGE) IMPLANT
SYR CONTROL 10ML LL (SYRINGE) ×3 IMPLANT
SYRINGE 10CC LL (SYRINGE) ×3 IMPLANT
TOWEL OR 17X26 10 PK STRL BLUE (TOWEL DISPOSABLE) ×3 IMPLANT

## 2014-08-01 NOTE — Op Note (Signed)
EDGAR REISZ [id number] [birth year] 08/01/2014  PREOPERATIVE DIAGNOSIS:  Metastatic colon cancer, need for IV access     POSTOPERATIVE DIAGNOSIS:  Same     PROCEDURE: Right Internal Jugular port placement with ultrasound guidance, Bard   Power Port, MRI safe, 8-French.      SURGEON:  Gayland Curry, MD FACS      ANESTHESIA:  General   FINDINGS:  Good venous return, easy flush, and tip of the catheter in prox RA     SPECIMEN:  None.      ESTIMATED BLOOD LOSS:  Minimal.      COMPLICATIONS:  None known.      PROCEDURE:  Pt was identified in the holding area and taken to   the operating room, where patient was placed supine on the operating room   table.  General anesthesia was induced.  Patient's arms were tucked and the upper   chest and neck were prepped and draped in sterile fashion. IV antibiotic administered.  Time-out was   performed according to the surgical safety check list.  When all was   correct, we continued.   Using ultrasound, I located the right internal jugular vein and accessed the vein with the needle with ultrasound guidance. There was good venous return and the wire passed easily with some ectopy and I pulled it back til no ectopy.  Fluoroscopy was used to confirm that the wire was in the vena cava. Local anesthetic was administered over this   area at the angle of the clavicle and along the right neck.      The patient was placed back level and the area for the pocket was anethetized   with local anesthetic.  A 3-cm transverse incision was made with a #15   blade.  Cautery was used to divide the subcutaneous tissues down to the   pectoralis muscle.  An [employer] retractor was used to elevate the skin   while a pocket was created on top of the pectoralis fascia.  The port   was placed into the pocket to confirm that it was of adequate size. The catheter was tunneled through to the wire exit   site in the right neck using a tendon passer. The catheter was placed  along the wire to determine what length it should be to be in the SVC.  The catheter was cut.  The  catheter was then connected to the port.The tunneler sheath and dilator were passed over the wire and the dilator and wire were removed.  The catheter was advanced through the tunneler sheath and the tunneler sheath was pulled away.  Care was taken to keep the catheter in the tunneler sheath as this occurred. This was advanced and the tunneler sheath was removed.  There was good venous  return and easy flush of the catheter. Fluoro was repeated and i pulled the catheter back a little bit under fluoro guidance and spoke with radiology who felt the tip was in the proximal RA.    The port was then secured to the  pectoralis fascia with (2) 2-0 Prolene sutures.    The Prolene sutures were tied  down to the pectoral fascia.  The skin was reapproximated using 3-0  Vicryl interrupted deep dermal sutures.    Fluoroscopy was used to re-confirm good position of the catheter.  The skin  was then closed using 4-0 Monocryl in a subcuticular fashion.  The port was flushed with concentrated heparin flush as well.  The  wounds were then cleaned, dried, and dressed with Dermabond.  The patient was awakened from anesthesia and taken to the PACU in stable condition.  Needle, sponge, and instrument counts were correct.     Leighton Ruff. Redmond Pulling, MD, FACS General, Bariatric, & Minimally Invasive Surgery [facility], [place]

## 2014-08-01 NOTE — Anesthesia Preprocedure Evaluation (Addendum)
Anesthesia Evaluation  Patient identified by MRN, date of birth, ID band Patient awake    Reviewed: Allergy & Precautions, NPO status , Patient's Chart, lab work & pertinent test results  Airway Mallampati: II  TM Distance: >3 FB Neck ROM: Full    Dental no notable dental hx. (+)    Pulmonary  CT chest reviewed: masses, nodules noted breath sounds clear to auscultation  Pulmonary exam normal       Cardiovascular negative cardio ROS  Rhythm:Regular Rate:Normal     Neuro/Psych negative neurological ROS  negative psych ROS   GI/Hepatic negative GI ROS, Neg liver ROS,   Endo/Other  negative endocrine ROS  Renal/GU negative Renal ROS  negative genitourinary   Musculoskeletal negative musculoskeletal ROS (+)   Abdominal (+) + obese,   Peds negative pediatric ROS (+)  Hematology  (+) anemia , Hgb 9.1   Anesthesia Other Findings   Reproductive/Obstetrics negative OB ROS                            Anesthesia Physical Anesthesia Plan  ASA: II  Anesthesia Plan: General   Post-op Pain Management:    Induction: Intravenous  Airway Management Planned: LMA  Additional Equipment:   Intra-op Plan:   Post-operative Plan: Extubation in OR  Informed Consent: I have reviewed the patients History and Physical, chart, labs and discussed the procedure including the risks, benefits and alternatives for the proposed anesthesia with the patient or authorized representative who has indicated his/her understanding and acceptance.   Dental advisory given  Plan Discussed with: CRNA  Anesthesia Plan Comments: (MAC versus general. Will discuss with Dr. Redmond Pulling.  Dr. Redmond Pulling prefers general.)       Anesthesia Quick Evaluation

## 2014-08-01 NOTE — H&P (View-Only) (Signed)
2 Days Post-Op  Subjective: No complaints, no BM yesterday aside from some liquid stuff.  He is waiting for [facility] cath placement today.  Objective: Vital signs in last 24 hours: Temp:  [98 F (36.7 C)-98.6 F (37 C)] 98.6 F (37 C) (03/02 0503) Pulse Rate:  [67-72] 72 (03/02 0503) Resp:  [18-19] 19 (03/02 0503) BP: (116-124)/(73-77) 124/76 mmHg (03/02 0503) SpO2:  [99 %-100 %] 100 % (03/02 0503) Last BM Date: 07/31/14 960 PO  NPO now for procedure later. Afebrile, VSS H/H up with transfusions. Intake/Output from previous day: 03/01 0701 - 03/02 0700 In: 1205.4 [P.O.:960; I.V.:245.4] Out: -  Intake/Output this shift:    General appearance: alert, cooperative and no distress GI: soft, non-tender; bowel sounds normal; no masses,  no organomegaly  Lab Results:   Recent Labs  07/31/14 0551 08/01/14 0555  WBC 9.8 9.7  HGB 8.7* 9.1*  HCT 27.4* 28.8*  PLT 478* 496*    BMET  Recent Labs  07/30/14 0602  NA 136  K 4.0  CL 105  CO2 24  GLUCOSE 90  BUN 10  CREATININE 0.64  CALCIUM 8.4   PT/INR No results for input(s): LABPROT, INR in the last 72 hours.   Recent Labs Lab 07/28/14 1440 07/29/14 0847  AST 45* 41*  ALT 27 23  ALKPHOS 157* 148*  BILITOT 0.2* 0.8  PROT 5.8* 6.3  ALBUMIN 2.8* 2.8*     Lipase     Component Value Date/Time   LIPASE 22 07/29/2014 0847     Studies/Results: Ct Chest W Contrast  08/01/2014   CLINICAL DATA:  Stage IV colorectal cancer, assessment for metastatic disease in the chest.  EXAM: CT CHEST WITH CONTRAST  TECHNIQUE: Multidetector CT imaging of the chest was performed during intravenous contrast administration.  CONTRAST:  22mL OMNIPAQUE IOHEXOL 300 MG/ML  SOLN  COMPARISON:  07/30/2014  FINDINGS: Mediastinum/Nodes: Right lower paratracheal node 1.2 cm on image 23 series 2.  Right hilar nodal conglomeration may actually represent 2 adjacent lymph nodes and measures 2.7 by 2.8 cm, image 27 series 2.  Right infrahilar node, 1.4  cm in short axis on image 33 series 2.  Subcarinal node, 1.7 cm in short axis, image 28 series 2. There is an adjacent node below the right mainstem bronchus which measures 1.4 cm on the same image.  Lungs/Pleura: Right lower lobe nodule 0.8 by 1.0 cm, image 29 series 5.  Right upper lobe nodule, 0.3 cm on image 24 series 5.  Sub solid right upper lobe pulmonary nodule, 0.5 by 0.4 cm, image 18 series 5.  Right middle lobe region of airspace opacity/consolidation with surrounding interstitial accentuation and nodularity, image 33 series 5. Subtending airway thickening noted. Main region of consolidations/mass measures proximally 3.4 by 2.3 by 2.2 cm.  0.6 by 0.4 cm nodule in the left upper lobe, image 28 series 5.  Upper abdomen: As noted on recent abdomen CT there are numerous large enhancing masses in the liver. Contrast medium in a new left kidney upper pole calyceal diverticulum.  Musculoskeletal: Unremarkable  IMPRESSION: 1. Pathologic thoracic, right hilar, and infrahilar adenopathy associated with a masslike region of possible consolidation in the right middle lobe, surrounding nodularity, and some scattered bilateral pulmonary nodules. Differential diagnostic considerations favor metastatic disease or synchronous lung cancer, with infectious process such as tuberculosis less likely. 2. Enhancing masses in the liver compatible with metastatic colorectal cancer.   Electronically Signed   By: Van Clines M.D.   On: 08/01/2014  08:47   Ct Abdomen Pelvis W Contrast  07/31/2014   CLINICAL DATA:  49 year old male with abdominal pain, pallor and lack of energy. History of blood in stools noted in October 2015. 20 pounds unintentional weight loss over the past year.  EXAM: CT ABDOMEN AND PELVIS WITH CONTRAST  TECHNIQUE: Multidetector CT imaging of the abdomen and pelvis was performed using the standard protocol following bolus administration of intravenous contrast.  CONTRAST:  33mL OMNIPAQUE IOHEXOL 300 MG/ML  SOLN, 128mL OMNIPAQUE IOHEXOL 300 MG/ML SOLN  COMPARISON:  No priors.  FINDINGS: Lower chest: Some thickening of the peribronchovascular interstitium throughout the right middle lobe and ill-defined central airspace disease is noted in the right middle lobe, incompletely visualized.  Hepatobiliary: Numerous large hypovascular liver lesions concerning for widespread metastatic disease. The largest of these include an 8.2 x 6.7 x 7.7 cm lesion in segment 4B (image 38 of series 2), and a 7.0 x 8.9 x 7.6 cm lesion centered predominantly in segment 8 (image 18 of series 2). Numerous other smaller lesions are also noted. No intra or extrahepatic biliary ductal dilatation. Gallbladder is largely decompressed, but otherwise unremarkable in appearance. Liver is enlarged measuring 24.8 cm in craniocaudal span.  Pancreas: No pancreatic mass. No pancreatic or peripancreatic fluid collection.  Spleen: Unremarkable.  Adrenals/Urinary Tract: Bilateral adrenal glands are normal in appearance. 2.3 cm low-attenuation lesion in the lateral interpolar region of the left kidney is compatible with a simple cyst. In the upper pole of the left kidney there is an exophytic 4.0 cm low-attenuation lesion, however, this lesion demonstrates high attenuation material on the delayed phase, compatible with a large calyceal diverticulum. Other smaller sub cm low-attenuation lesions in the left kidney are too small to definitively characterize, but are favored to represent tiny cysts. Right kidney is normal in appearance. No hydroureteronephrosis. Urinary bladder is normal in appearance.  Stomach/Bowel: In the mid to distal sigmoid colon there is also rather diffuse wall thickening, which is asymmetrically increased along the superior margin of the colon, best appreciated on coronal image 85 of series 602, where this abnormal soft tissue thickening appears to extend up into the mid mesocolon along the course of the inferior mesenteric artery where  there is lymphadenopathy. This mass like soft tissue thickening is irregular in shape and therefore difficult to measure, however, this is estimated to measure approximately 4.2 x 3.6 x 3.9 cm (images 60 of series 2 and image 84 of series 602). Normal appearance of the stomach. No pathologic dilatation of small bowel or colon. Diffuse thickening of the rectum.  Vascular/Lymphatic: Mild atherosclerosis throughout the abdominal and pelvic vasculature, without evidence of aneurysm or dissection. Lymphadenopathy is noted in the sigmoid mesocolon along the course of the inferior mesenteric artery where a lymph node measures up to 1 cm in short axis. Multiple borderline enlarged retroperitoneal lymph nodes are also noted. In addition, several other mesenteric lymph nodes are noted, largest of which are in the ileocolic distribution, measuring up to 1.5 cm in short axis (image 52 of series 2).  Reproductive: Prostate gland and seminal vesicles are normal in appearance.  Other: No significant volume of ascites.  No pneumoperitoneum.  Musculoskeletal: There are no aggressive appearing lytic or blastic lesions noted in the visualized portions of the skeleton.  IMPRESSION: 1. Findings, as above, concerning for primary colorectal neoplasm in the mid to distal sigmoid colon, with lymphadenopathy in the sigmoid mesocolon, ileocolic mesentery, and retroperitoneum, as well as widespread metastatic disease to the liver, as  detailed above. 2. Central airspace consolidation in the right middle lobe with septal thickening throughout the right middle lobe. These findings are nonspecific, and incompletely visualized, but raise concern for a centrally obstructing lesion (potentially a centrally obstructing metastasis and). Consider further evaluation of the chest with contrast enhanced chest CT in the near future to better evaluate this finding. 3. Multiple left renal lesions appear compatible with simple cysts and a large calyceal  diverticulum, as above. 4. Additional incidental findings, as above.   Electronically Signed   By: Vinnie Langton M.D.   On: 07/31/2014 08:10    Medications: .  ceFAZolin (ANCEF) IV  2 g Intravenous On Call to OR  . ferrous sulfate  325 mg Oral BID  . pantoprazole (PROTONIX) IV  40 mg Intravenous Q12H  . sodium chloride  3 mL Intravenous Q12H    Assessment/Plan 1. Sigmoid colon mass with possible metastasis  2. 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 Anemia with recent transfusion 4.  SCD's for DVT ordered.  Plan:  [place] cath placement to be followed by out patient chemotherapy at this point.   LOS: 4 days    Lashae Wollenberg 08/01/2014

## 2014-08-01 NOTE — Progress Notes (Signed)
Portable Chest X-ray results called to Dr. Redmond Pulling.

## 2014-08-01 NOTE — Transfer of Care (Signed)
Immediate Anesthesia Transfer of Care Note  Patient: Tom [name]  Procedure(s) Performed: Procedure(s): INSERTION RIGHT IJ PORT-A-CATH WITH ULTRASOUND AND FLUORO (N/A) OPERATIVE ULTRASOUND (N/A)  Patient Location: PACU  Anesthesia Type:General  Level of Consciousness: awake, alert  and oriented  Airway & Oxygen Therapy: Patient Spontanous Breathing and Patient connected to face mask oxygen  Post-op Assessment: Report given to RN and Post -op Vital signs reviewed and stable  Post vital signs: Reviewed and stable  Last Vitals:  Filed Vitals:   08/01/14 0503  BP: 124/76  Pulse: 72  Temp: 37 C  Resp: 19    Complications: No apparent anesthesia complications

## 2014-08-01 NOTE — Interval H&P Note (Signed)
History and Physical Interval Note:  08/01/2014 10:06 AM  Tom [name]  has presented today for surgery, with the diagnosis of metatstatic colon cancer  The various methods of treatment have been discussed with the patient and family. After consideration of risks, benefits and other options for treatment, the patient has consented to  Procedure(s): INSERTION PORT-A-CATH (N/A) OPERATIVE ULTRASOUND (N/A) as a surgical intervention .  The patient's history has been reviewed, patient examined, no change in status, stable for surgery.  I have reviewed the patient's chart and labs.  Questions were answered to the patient's satisfaction.    Leighton Ruff. Redmond Pulling, MD, [place], Bariatric, & Minimally Invasive Surgery [facility], [place]   [facility] M

## 2014-08-01 NOTE — Anesthesia Postprocedure Evaluation (Signed)
  Anesthesia Post-op Note  Patient: Tom [name]  Procedure(s) Performed: Procedure(s) (LRB): INSERTION RIGHT IJ PORT-A-CATH WITH ULTRASOUND AND FLUORO (N/A) OPERATIVE ULTRASOUND (N/A)  Patient Location: PACU  Anesthesia Type: General  Level of Consciousness: awake and alert   Airway and Oxygen Therapy: Patient Spontanous Breathing  Post-op Pain: mild  Post-op Assessment: Post-op Vital signs reviewed, Patient's Cardiovascular Status Stable, Respiratory Function Stable, Patent Airway and No signs of Nausea or vomiting  Last Vitals:  Filed Vitals:   08/01/14 1230  BP: 119/60  Pulse: 62  Temp:   Resp: 19    Post-op Vital Signs: stable   Complications: No apparent anesthesia complications

## 2014-08-01 NOTE — Progress Notes (Signed)
Visited patient in his room as he is awaiting his PAC placement. He verbalizes some anxiety, but says he coping well thus far. Provided him Dr. Gearldine Shown business card as he had requested and gave him appointment calendar with his appointment tomorrow 08/02/14 at [facility] with Robynn Pane, [place] working with Dr. Whitney Muse. Called [facility] and left message for scheduling to confirm they have everything they need for his new patient appointment tomorrow and to confirm it is accurate.

## 2014-08-01 NOTE — Patient Instructions (Addendum)
[facility]   CHEMOTHERAPY INSTRUCTIONS  Premeds: Zofran - IV- for nausea prevention/reduction. Dexamethasone - IV- steriod - this is to reduce the risk of you having an allergic type reaction to the Oxaliplatin chemotherapy. Side Effects of Dex: having an increase in energy, trouble sleeping, face/neck may turn red or you may feel flushed in the face/neck. This will pass as the drug wears off.  Oxaliplatin - anaphylactic reaction, neurotoxicity (i.e., headache, fatigue, difficulty sleeping, pain). Peripheral neuropathy (numbness/tingling/burning in hands/fingers/feet/toes) - will be aggravated by cold/cool temperatures. We need to know when you develop peripheral neuropathy so that we can monitor it and treat if necessary. Nausea/vomiting, diarrhea, bone marrow suppression (lowers white blood cells (fight infection), lowers red blood cells (make up your blood), lowers platelets (help blood to clot). Pulmonary fibrosis. Once you have received Oxaliplatin do NOT eat or drinking anything cold/cool for 5-10 days! Do NOT breathe in cold/cool air and do NOT touch anything cold for 5-10 days. The time frame varies from patient to patient on the length of time you must abstain from the above mentioned. Best advice is to wait at least 5 days before attempting to reintroduce cold/cool back into life. Slowly reintroduce cool/cold things! Wear gloves when getting items out of the refrigerator (of course, these would be things you are going to heat to eat)!  (takes 2 hours to infuse)  Leucovorin - this is a medication that is not chemo but given with chemo. This med "rescues" the healthy cells before we administer the drug 5FU. This makes the 5FU work better.   (takes 2 hours to infuse - goes in while Oxaliplatin goes in)  Avastin - this medication is considered an anti-angiogenic therapy. Avastin is thought to work by causing the blood vessels to shrink away from the tumor, blocking  the supply of oxygen and nutrients that the tumor needs to grow. Avastin may also cause the existing blood vessels to change in ways that help the chemotherapy reach the tumor more effectively. Avastin may also work to interfere with the growth of new blood vessels, causing the tumor to starve. Side Effects: Nosebleeds, High Blood Pressure, Protein in Urine, & diarrhea.   5FU: bone marrow suppression (low white blood cells - wbcs fight infection, low red blood cells - rbcs make up your blood, low platelets - this is what makes your blood clot, nausea/vomiting, diarrhea, mouth sores, hair loss, dry skin, ocular toxicities (increased tear production, sensitivity to light). You must wear sunscreen/sunglasses. Cover your skin when out in sunlight. You will get burned very easily. (approximately 10 min IV push) + you will wear this for 46 hours on an infusion pump that you take home with you.    POTENTIAL SIDE EFFECTS OF TREATMENT: Increased Susceptibility to Infection, Vomiting, Constipation, Hair Thinning, Changes in Character of Skin and Nails (brittleness, dryness,etc.), Bone Marrow Suppression, Nausea, Diarrhea, Sun Sensitivity and Mouth Sores   EDUCATIONAL MATERIALS GIVEN AND REVIEWED: Chemotherapy and You booklets Specific Instructions Sheets: Oxaliplatin, Leucovorin, 5FU, Zofran, Dexamethasone, EMLA cream, Zofran, Compazine   SELF CARE ACTIVITIES WHILE ON CHEMOTHERAPY: Increase your fluid intake 48 hours prior to treatment and drink at least 2 quarts per day after treatment., No alcohol intake., No aspirin or other medications unless approved by your oncologist., Eat foods that are light and easy to digest., No fried, fatty, or spicy foods immediately before or after treatment., Have teeth cleaned professionally before starting treatment. Keep dentures and partial plates clean., Use  soft toothbrush and do not use mouthwashes that contain alcohol. Biotene is a good mouthwash that is available at  most pharmacies or may be ordered by calling [phone]., Use warm salt water gargles (1 teaspoon salt per 1 quart warm water) before and after meals and at bedtime. Or you may rinse with 2 tablespoons of three -percent hydrogen peroxide mixed in eight ounces of water., Always use sunscreen with SPF (Sun Protection Factor) of 30 or higher., Use your nausea medication as directed to prevent nausea., Use your stool softener or laxative as directed to prevent constipation. and Use your anti-diarrheal medication as directed to stop diarrhea.  Please wash your hands for at least 30 seconds using warm soapy water. Handwashing is the #1 way to prevent the spread of germs. Stay away from sick people or people who are getting over a cold. If you develop respiratory systems such as green/yellow mucus production or productive cough or persistent cough let us know and we will see if you need an antibiotic. It is a good idea to keep a pair of gloves on when going into grocery stores/[employer] to decrease your risk of coming into contact with germs on the carts, etc. Carry alcohol hand gel with you at all times and use it frequently if out in public. All foods need to be cooked thoroughly. No raw foods. No medium or undercooked meats, eggs. If your food is cooked medium well, it does not need to be hot pink or saturated with bloody liquid at all. Vegetables and fruits need to be washed/rinsed under the faucet with a dish detergent before being consumed. You can eat raw fruits and vegetables unless we tell you otherwise but it would be best if you cooked them or bought frozen. Do not eat off of salad bars or hot bars unless you really trust the cleanliness of the restaurant. If you need dental work, please let Dr. Whitney Muse know before you go for your appointment so that we can coordinate the best possible time for you in regards to your chemo regimen. You need to also let your dentist know that you are actively taking chemo. We  may need to do labs prior to your dental appointment. We also want your bowels moving at least every other day. If this is not happening, we need to know so that we can get you on a bowel regimen to help you go.     MEDICATIONS: You have been given prescriptions for the following medications:  EMLA cream. Apply a quarter size amount to port site 1 hour prior to chemo. Do not rub in. Cover with plastic wrapl  Zofran 8mg  tablet. May take 1 tablet every 8 hours as needed for nausea/vomiting.  Compazine 10mg  tablet. Take 1 tablet every 6 hours as needed for nausea/vomiting.    Over-the-Counter Meds:  Senna - this is a mild laxative used to treat mild constipation. May take 1-8 tabs by mouth daily as needed for mild constipation.  Milk of Magnesia - this is a laxative used to treat moderate to severe constipation. May take 2-4 tablespoons every 8 hours as needed. May increase to 8 tablespoons x 1 dose and if no bowel movement call the [facility].  Imodium - this is for diarrhea. Take 2 tabs after 1st loose stool and then 1 tab every 2 hours until you go a total of 12 hours without a loose stool. Call [facility] if loose stools continue.   SYMPTOMS TO REPORT AS  SOON AS POSSIBLE AFTER TREATMENT:  FEVER GREATER THAN 100.5 F  CHILLS WITH OR WITHOUT FEVER  NAUSEA AND VOMITING THAT IS NOT CONTROLLED WITH YOUR NAUSEA MEDICATION  UNUSUAL SHORTNESS OF BREATH  UNUSUAL BRUISING OR BLEEDING  TENDERNESS IN MOUTH AND THROAT WITH OR WITHOUT PRESENCE OF ULCERS  URINARY PROBLEMS  BOWEL PROBLEMS  UNUSUAL RASH    Wear comfortable clothing and clothing appropriate for easy access to any Portacath or PICC line. Let us know if there is anything that we can do to make your therapy better!      I have been informed and understand all of the instructions given to me and have received a copy. I have been instructed to call the clinic [phone] or my family physician as soon as possible  for continued medical care, if indicated. I do not have any more questions at this time but understand that I may call the [facility] or the Patient Navigator at [phone] during office hours should I have questions or need assistance in obtaining follow-up care.           Oxaliplatin Injection What is this medicine? OXALIPLATIN (ox AL i PLA tin) is a chemotherapy drug. It targets fast dividing cells, like cancer cells, and causes these cells to die. This medicine is used to treat cancers of the colon and rectum, and many other cancers. This medicine may be used for other purposes; ask your health care provider or pharmacist if you have questions. COMMON BRAND NAME(S): Eloxatin What should I tell my health care provider before I take this medicine? They need to know if you have any of these conditions: -kidney disease -an unusual or allergic reaction to oxaliplatin, other chemotherapy, other medicines, foods, dyes, or preservatives -pregnant or trying to get pregnant -breast-feeding How should I use this medicine? This drug is given as an infusion into a vein. It is administered in a hospital or clinic by a specially trained health care professional. Talk to your pediatrician regarding the use of this medicine in children. Special care may be needed. Overdosage: If you think you have taken too much of this medicine contact a poison control center or emergency room at once. NOTE: This medicine is only for you. Do not share this medicine with others. What if I miss a dose? It is important not to miss a dose. Call your doctor or health care professional if you are unable to keep an appointment. What may interact with this medicine? -medicines to increase blood counts like filgrastim, pegfilgrastim, sargramostim -probenecid -some antibiotics like amikacin, gentamicin, neomycin, polymyxin B, streptomycin, tobramycin -zalcitabine Talk to your doctor or health care professional  before taking any of these medicines: -acetaminophen -aspirin -ibuprofen -ketoprofen -naproxen This list may not describe all possible interactions. Give your health care provider a list of all the medicines, herbs, non-prescription drugs, or dietary supplements you use. Also tell them if you smoke, drink alcohol, or use illegal drugs. Some items may interact with your medicine. What should I watch for while using this medicine? Your condition will be monitored carefully while you are receiving this medicine. You will need important blood work done while you are taking this medicine. This medicine can make you more sensitive to cold. Do not drink cold drinks or use ice. Cover exposed skin before coming in contact with cold temperatures or cold objects. When out in cold weather wear warm clothing and cover your mouth and nose to warm the air that goes into  your lungs. Tell your doctor if you get sensitive to the cold. This drug may make you feel generally unwell. This is not uncommon, as chemotherapy can affect healthy cells as well as cancer cells. Report any side effects. Continue your course of treatment even though you feel ill unless your doctor tells you to stop. In some cases, you may be given additional medicines to help with side effects. Follow all directions for their use. Call your doctor or health care professional for advice if you get a fever, chills or sore throat, or other symptoms of a cold or flu. Do not treat yourself. This drug decreases your body's ability to fight infections. Try to avoid being around people who are sick. This medicine may increase your risk to bruise or bleed. Call your doctor or health care professional if you notice any unusual bleeding. Be careful brushing and flossing your teeth or using a toothpick because you may get an infection or bleed more easily. If you have any dental work done, tell your dentist you are receiving this medicine. Avoid taking products  that contain aspirin, acetaminophen, ibuprofen, naproxen, or ketoprofen unless instructed by your doctor. These medicines may hide a fever. Do not become pregnant while taking this medicine. Women should inform their doctor if they wish to become pregnant or think they might be pregnant. There is a potential for serious side effects to an unborn child. Talk to your health care professional or pharmacist for more information. Do not breast-feed an infant while taking this medicine. Call your doctor or health care professional if you get diarrhea. Do not treat yourself. What side effects may I notice from receiving this medicine? Side effects that you should report to your doctor or health care professional as soon as possible: -allergic reactions like skin rash, itching or hives, swelling of the face, lips, or tongue -low blood counts - This drug may decrease the number of white blood cells, red blood cells and platelets. You may be at increased risk for infections and bleeding. -signs of infection - fever or chills, cough, sore throat, pain or difficulty passing urine -signs of decreased platelets or bleeding - bruising, pinpoint red spots on the skin, black, tarry stools, nosebleeds -signs of decreased red blood cells - unusually weak or tired, fainting spells, lightheadedness -breathing problems -chest pain, pressure -cough -diarrhea -jaw tightness -mouth sores -nausea and vomiting -pain, swelling, redness or irritation at the injection site -pain, tingling, numbness in the hands or feet -problems with balance, talking, walking -redness, blistering, peeling or loosening of the skin, including inside the mouth -trouble passing urine or change in the amount of urine Side effects that usually do not require medical attention (report to your doctor or health care professional if they continue or are bothersome): -changes in vision -constipation -hair loss -loss of appetite -metallic taste in  the mouth or changes in taste -stomach pain This list may not describe all possible side effects. Call your doctor for medical advice about side effects. You may report side effects to FDA at 1-800-FDA-1088. Where should I keep my medicine? This drug is given in a hospital or clinic and will not be stored at home. NOTE: This sheet is a summary. It may not cover all possible information. If you have questions about this medicine, talk to your doctor, pharmacist, or health care provider.  2015, Elsevier/[employer] Standard. (2007-12-13 17:22:47) Leucovorin injection What is this medicine? LEUCOVORIN (loo koe VOR in) is used to prevent or treat the  harmful effects of some medicines. This medicine is used to treat anemia caused by a low amount of folic acid in the body. It is also used with 5-fluorouracil (5-FU) to treat colon cancer. This medicine may be used for other purposes; ask your health care provider or pharmacist if you have questions. What should I tell my health care provider before I take this medicine? They need to know if you have any of these conditions: -anemia from low levels of vitamin B-12 in the blood -an unusual or allergic reaction to leucovorin, folic acid, other medicines, foods, dyes, or preservatives -pregnant or trying to get pregnant -breast-feeding How should I use this medicine? This medicine is for injection into a muscle or into a vein. It is given by a health care professional in a hospital or clinic setting. Talk to your pediatrician regarding the use of this medicine in children. Special care may be needed. Overdosage: If you think you have taken too much of this medicine contact a poison control center or emergency room at once. NOTE: This medicine is only for you. Do not share this medicine with others. What if I miss a dose? This does not apply. What may interact with this  medicine? -capecitabine -fluorouracil -phenobarbital -phenytoin -primidone -trimethoprim-sulfamethoxazole This list may not describe all possible interactions. Give your health care provider a list of all the medicines, herbs, non-prescription drugs, or dietary supplements you use. Also tell them if you smoke, drink alcohol, or use illegal drugs. Some items may interact with your medicine. What should I watch for while using this medicine? Your condition will be monitored carefully while you are receiving this medicine. This medicine may increase the side effects of 5-fluorouracil, 5-FU. Tell your doctor or health care professional if you have diarrhea or mouth sores that do not get better or that get worse. What side effects may I notice from receiving this medicine? Side effects that you should report to your doctor or health care professional as soon as possible: -allergic reactions like skin rash, itching or hives, swelling of the face, lips, or tongue -breathing problems -fever, infection -mouth sores -unusual bleeding or bruising -unusually weak or tired Side effects that usually do not require medical attention (report to your doctor or health care professional if they continue or are bothersome): -constipation or diarrhea -loss of appetite -nausea, vomiting This list may not describe all possible side effects. Call your doctor for medical advice about side effects. You may report side effects to FDA at 1-800-FDA-1088. Where should I keep my medicine? This drug is given in a hospital or clinic and will not be stored at home. NOTE: This sheet is a summary. It may not cover all possible information. If you have questions about this medicine, talk to your doctor, pharmacist, or health care provider.  2015, Elsevier/Gold Standard. (2007-11-22 16:50:29) Fluorouracil, 5-FU injection What is this medicine? FLUOROURACIL, 5-FU (flure oh YOOR a sil) is a chemotherapy drug. It slows the  growth of cancer cells. This medicine is used to treat many types of cancer like breast cancer, colon or rectal cancer, pancreatic cancer, and stomach cancer. This medicine may be used for other purposes; ask your health care provider or pharmacist if you have questions. COMMON BRAND NAME(S): Adrucil What should I tell my health care provider before I take this medicine? They need to know if you have any of these conditions: -blood disorders -dihydropyrimidine dehydrogenase (DPD) deficiency -infection (especially a virus infection such as chickenpox, cold sores, or  herpes) -kidney disease -liver disease -malnourished, poor nutrition -recent or ongoing radiation therapy -an unusual or allergic reaction to fluorouracil, other chemotherapy, other medicines, foods, dyes, or preservatives -pregnant or trying to get pregnant -breast-feeding How should I use this medicine? This drug is given as an infusion or injection into a vein. It is administered in a hospital or clinic by a specially trained health care professional. Talk to your pediatrician regarding the use of this medicine in children. Special care may be needed. Overdosage: If you think you have taken too much of this medicine contact a poison control center or emergency room at once. NOTE: This medicine is only for you. Do not share this medicine with others. What if I miss a dose? It is important not to miss your dose. Call your doctor or health care professional if you are unable to keep an appointment. What may interact with this medicine? -allopurinol -cimetidine -dapsone -digoxin -hydroxyurea -leucovorin -levamisole -medicines for seizures like ethotoin, fosphenytoin, phenytoin -medicines to increase blood counts like filgrastim, pegfilgrastim, sargramostim -medicines that treat or prevent blood clots like warfarin, enoxaparin, and dalteparin -methotrexate -metronidazole -pyrimethamine -some other chemotherapy drugs like  busulfan, cisplatin, estramustine, vinblastine -trimethoprim -trimetrexate -vaccines Talk to your doctor or health care professional before taking any of these medicines: -acetaminophen -aspirin -ibuprofen -ketoprofen -naproxen This list may not describe all possible interactions. Give your health care provider a list of all the medicines, herbs, non-prescription drugs, or dietary supplements you use. Also tell them if you smoke, drink alcohol, or use illegal drugs. Some items may interact with your medicine. What should I watch for while using this medicine? Visit your doctor for checks on your progress. This drug may make you feel generally unwell. This is not uncommon, as chemotherapy can affect healthy cells as well as cancer cells. Report any side effects. Continue your course of treatment even though you feel ill unless your doctor tells you to stop. In some cases, you may be given additional medicines to help with side effects. Follow all directions for their use. Call your doctor or health care professional for advice if you get a fever, chills or sore throat, or other symptoms of a cold or flu. Do not treat yourself. This drug decreases your body's ability to fight infections. Try to avoid being around people who are sick. This medicine may increase your risk to bruise or bleed. Call your doctor or health care professional if you notice any unusual bleeding. Be careful brushing and flossing your teeth or using a toothpick because you may get an infection or bleed more easily. If you have any dental work done, tell your dentist you are receiving this medicine. Avoid taking products that contain aspirin, acetaminophen, ibuprofen, naproxen, or ketoprofen unless instructed by your doctor. These medicines may hide a fever. Do not become pregnant while taking this medicine. Women should inform their doctor if they wish to become pregnant or think they might be pregnant. There is a potential for  serious side effects to an unborn child. Talk to your health care professional or pharmacist for more information. Do not breast-feed an infant while taking this medicine. Men should inform their doctor if they wish to father a child. This medicine may lower sperm counts. Do not treat diarrhea with over the counter products. Contact your doctor if you have diarrhea that lasts more than 2 days or if it is severe and watery. This medicine can make you more sensitive to the sun. Keep out of  the sun. If you cannot avoid being in the sun, wear protective clothing and use sunscreen. Do not use sun lamps or tanning beds/booths. What side effects may I notice from receiving this medicine? Side effects that you should report to your doctor or health care professional as soon as possible: -allergic reactions like skin rash, itching or hives, swelling of the face, lips, or tongue -low blood counts - this medicine may decrease the number of white blood cells, red blood cells and platelets. You may be at increased risk for infections and bleeding. -signs of infection - fever or chills, cough, sore throat, pain or difficulty passing urine -signs of decreased platelets or bleeding - bruising, pinpoint red spots on the skin, black, tarry stools, blood in the urine -signs of decreased red blood cells - unusually weak or tired, fainting spells, lightheadedness -breathing problems -changes in vision -chest pain -mouth sores -nausea and vomiting -pain, swelling, redness at site where injected -pain, tingling, numbness in the hands or feet -redness, swelling, or sores on hands or feet -stomach pain -unusual bleeding Side effects that usually do not require medical attention (report to your doctor or health care professional if they continue or are bothersome): -changes in finger or toe nails -diarrhea -dry or itchy skin -hair loss -headache -loss of appetite -sensitivity of eyes to the light -stomach  upset -unusually teary eyes This list may not describe all possible side effects. Call your doctor for medical advice about side effects. You may report side effects to FDA at 1-800-FDA-1088. Where should I keep my medicine? This drug is given in a hospital or clinic and will not be stored at home. NOTE: This sheet is a summary. It may not cover all possible information. If you have questions about this medicine, talk to your doctor, pharmacist, or health care provider.  2015, [employer] Standard. (2007-09-21 13:53:16) Ondansetron injection What is this medicine? ONDANSETRON (on DAN se tron) is used to treat nausea and vomiting caused by chemotherapy. It is also used to prevent or treat nausea and vomiting after surgery. This medicine may be used for other purposes; ask your health care provider or pharmacist if you have questions. COMMON BRAND NAME(S): Zofran What should I tell my health care provider before I take this medicine? They need to know if you have any of these conditions: -heart disease -history of irregular heartbeat -liver disease -low levels of magnesium or potassium in the blood -an unusual or allergic reaction to ondansetron, granisetron, other medicines, foods, dyes, or preservatives -pregnant or trying to get pregnant -breast-feeding How should I use this medicine? This medicine is for infusion into a vein. It is given by a health care professional in a hospital or clinic setting. Talk to your pediatrician regarding the use of this medicine in children. Special care may be needed. Overdosage: If you think you have taken too much of this medicine contact a poison control center or emergency room at once. NOTE: This medicine is only for you. Do not share this medicine with others. What if I miss a dose? This does not apply. What may interact with this medicine? Do not take this medicine with any of the following medications: -apomorphine -certain medicines for  fungal infections like fluconazole, itraconazole, ketoconazole, posaconazole, voriconazole -cisapride -dofetilide -dronedarone -pimozide -thioridazine -ziprasidone This medicine may also interact with the following medications: -carbamazepine -certain medicines for depression, anxiety, or psychotic disturbances -fentanyl -linezolid -MAOIs like Carbex, Eldepryl, Marplan, Nardil, and Parnate -methylene blue (injected into a vein) -  other medicines that prolong the QT interval (cause an abnormal heart rhythm) -phenytoin -rifampicin -tramadol This list may not describe all possible interactions. Give your health care provider a list of all the medicines, herbs, non-prescription drugs, or dietary supplements you use. Also tell them if you smoke, drink alcohol, or use illegal drugs. Some items may interact with your medicine. What should I watch for while using this medicine? Your condition will be monitored carefully while you are receiving this medicine. What side effects may I notice from receiving this medicine? Side effects that you should report to your doctor or health care professional as soon as possible: -allergic reactions like skin rash, itching or hives, swelling of the face, lips, or tongue -breathing problems -confusion -dizziness -fast or irregular heartbeat -feeling faint or lightheaded, falls -fever and chills -loss of balance or coordination -seizures -sweating -swelling of the hands and feet -tightness in the chest -tremors -unusually weak or tired Side effects that usually do not require medical attention (report to your doctor or health care professional if they continue or are bothersome): -constipation or diarrhea -headache This list may not describe all possible side effects. Call your doctor for medical advice about side effects. You may report side effects to FDA at 1-800-FDA-1088. Where should I keep my medicine? This drug is given in a hospital or  clinic and will not be stored at home. NOTE: This sheet is a summary. It may not cover all possible information. If you have questions about this medicine, talk to your doctor, pharmacist, or health care provider.  2015, [employer]. (2013-02-22 16:18:28) Dexamethasone injection What is this medicine? DEXAMETHASONE (dex a METH a sone) is a corticosteroid. It is used to treat inflammation of the skin, joints, lungs, and other organs. Common conditions treated include asthma, allergies, and arthritis. It is also used for other conditions, like blood disorders and diseases of the adrenal glands. This medicine may be used for other purposes; ask your health care provider or pharmacist if you have questions. COMMON BRAND NAME(S): Decadron, Solurex What should I tell my health care provider before I take this medicine? They need to know if you have any of these conditions: -blood clotting problems -Cushing's syndrome -diabetes -glaucoma -heart problems or disease -high blood pressure -infection like herpes, measles, tuberculosis, or chickenpox -kidney disease -liver disease -mental problems -myasthenia gravis -osteoporosis -previous heart attack -seizures -stomach, ulcer or intestine disease including colitis and diverticulitis -thyroid problem -an unusual or allergic reaction to dexamethasone, corticosteroids, other medicines, lactose, foods, dyes, or preservatives -pregnant or trying to get pregnant -breast-feeding How should I use this medicine? This medicine is for injection into a muscle, joint, lesion, soft tissue, or vein. It is given by a health care professional in a hospital or clinic setting. Talk to your pediatrician regarding the use of this medicine in children. Special care may be needed. Overdosage: If you think you have taken too much of this medicine contact a poison control center or emergency room at once. NOTE: This medicine is only for you. Do not share  this medicine with others. What if I miss a dose? This may not apply. If you are having a series of injections over a prolonged period, try not to miss an appointment. Call your doctor or health care professional to reschedule if you are unable to keep an appointment. What may interact with this medicine? Do not take this medicine with any of the following medications: -mifepristone, RU-486 -vaccines This medicine may also interact  with the following medications: -amphotericin B -antibiotics like clarithromycin, erythromycin, and troleandomycin -aspirin and aspirin-like drugs -barbiturates like phenobarbital -carbamazepine -cholestyramine -cholinesterase inhibitors like donepezil, galantamine, rivastigmine, and tacrine -cyclosporine -digoxin -diuretics -ephedrine -male hormones, like estrogens or progestins and birth control pills -indinavir -isoniazid -ketoconazole -medicines for diabetes -medicines that improve muscle tone or strength for conditions like myasthenia gravis -NSAIDs, medicines for pain and inflammation, like ibuprofen or naproxen -phenytoin -rifampin -thalidomide -warfarin This list may not describe all possible interactions. Give your health care provider a list of all the medicines, herbs, non-prescription drugs, or dietary supplements you use. Also tell them if you smoke, drink alcohol, or use illegal drugs. Some items may interact with your medicine. What should I watch for while using this medicine? Your condition will be monitored carefully while you are receiving this medicine. If you are taking this medicine for a long time, carry an identification card with your name and address, the type and dose of your medicine, and your doctor's name and address. This medicine may increase your risk of getting an infection. Stay away from people who are sick. Tell your doctor or health care professional if you are around anyone with measles or chickenpox. Talk to your  health care provider before you get any vaccines that you take this medicine. If you are going to have surgery, tell your doctor or health care professional that you have taken this medicine within the last twelve months. Ask your doctor or health care professional about your diet. You may need to lower the amount of salt you eat. The medicine can increase your blood sugar. If you are a diabetic check with your doctor if you need help adjusting the dose of your diabetic medicine. What side effects may I notice from receiving this medicine? Side effects that you should report to your doctor or health care professional as soon as possible: -allergic reactions like skin rash, itching or hives, swelling of the face, lips, or tongue -black or tarry stools -change in the amount of urine -changes in vision -confusion, excitement, restlessness, a false sense of well-being -fever, sore throat, sneezing, cough, or other signs of infection, wounds that will not heal -hallucinations -increased thirst -mental depression, mood swings, mistaken feelings of self importance or of being mistreated -pain in hips, back, ribs, arms, shoulders, or legs -pain, redness, or irritation at the injection site -redness, blistering, peeling or loosening of the skin, including inside the mouth -rounding out of face -swelling of feet or lower legs -unusual bleeding or bruising -unusual tired or weak -wounds that do not heal Side effects that usually do not require medical attention (report to your doctor or health care professional if they continue or are bothersome): -diarrhea or constipation -change in taste -headache -nausea, vomiting -skin problems, acne, thin and shiny skin -touble sleeping -unusual growth of hair on the face or body -weight gain This list may not describe all possible side effects. Call your doctor for medical advice about side effects. You may report side effects to FDA at  1-800-FDA-1088. Where should I keep my medicine? This drug is given in a hospital or clinic and will not be stored at home. NOTE: This sheet is a summary. It may not cover all possible information. If you have questions about this medicine, talk to your doctor, pharmacist, or health care provider.  2015, [employer]. (2007-09-08 14:04:12) Lidocaine; Prilocaine cream What is this medicine? LIDOCAINE; PRILOCAINE (LYE doe kane; PRIL oh kane) is a topical anesthetic that  causes loss of feeling in the skin and surrounding tissues. It is used to numb the skin before procedures or injections. This medicine may be used for other purposes; ask your health care provider or pharmacist if you have questions. COMMON BRAND NAME(S): EMLA What should I tell my health care provider before I take this medicine? They need to know if you have any of these conditions: -glucose-6-phosphate deficiencies -heart disease -kidney or liver disease -methemoglobinemia -an unusual or allergic reaction to lidocaine, prilocaine, other medicines, foods, dyes, or preservatives -pregnant or trying to get pregnant -breast-feeding How should I use this medicine? This medicine is for external use only on the skin. Do not take by mouth. Follow the directions on the prescription label. Wash hands before and after use. Do not use more or leave in contact with the skin longer than directed. Do not apply to eyes or open wounds. It can cause irritation and blurred or temporary loss of vision. If this medicine comes in contact with your eyes, immediately rinse the eye with water. Do not touch or rub the eye. Contact your health care provider right away. Talk to your pediatrician regarding the use of this medicine in children. While this medicine may be prescribed for children for selected conditions, precautions do apply. Overdosage: If you think you have taken too much of this medicine contact a poison control center or  emergency room at once. NOTE: This medicine is only for you. Do not share this medicine with others. What if I miss a dose? This medicine is usually only applied once prior to each procedure. It must be in contact with the skin for a period of time for it to work. If you applied this medicine later than directed, tell your health care professional before starting the procedure. What may interact with this medicine? -acetaminophen -chloroquine -dapsone -medicines to control heart rhythm -nitrates like nitroglycerin and nitroprusside -other ointments, creams, or sprays that may contain anesthetic medicine -phenobarbital -phenytoin -quinine -sulfonamides like sulfacetamide, sulfamethoxazole, sulfasalazine and others This list may not describe all possible interactions. Give your health care provider a list of all the medicines, herbs, non-prescription drugs, or dietary supplements you use. Also tell them if you smoke, drink alcohol, or use illegal drugs. Some items may interact with your medicine. What should I watch for while using this medicine? Be careful to avoid injury to the treated area while it is numb and you are not aware of pain. Avoid scratching, rubbing, or exposing the treated area to hot or cold temperatures until complete sensation has returned. The numb feeling will wear off a few hours after applying the cream. What side effects may I notice from receiving this medicine? Side effects that you should report to your doctor or health care professional as soon as possible: -blurred vision -chest pain -difficulty breathing -dizziness -drowsiness -fast or irregular heartbeat -skin rash or itching -swelling of your throat, lips, or face -trembling Side effects that usually do not require medical attention (report to your doctor or health care professional if they continue or are bothersome): -changes in ability to feel hot or cold -redness and swelling at the application  site This list may not describe all possible side effects. Call your doctor for medical advice about side effects. You may report side effects to FDA at 1-800-FDA-1088. Where should I keep my medicine? Keep out of reach of children. Store at room temperature between 15 and 30 degrees C (59 and 86 degrees F). Keep container tightly  closed. Throw away any unused medicine after the expiration date. NOTE: This sheet is a summary. It may not cover all possible information. If you have questions about this medicine, talk to your doctor, pharmacist, or health care provider.  2015, [employer]. (2007-11-21 17:14:35) Prochlorperazine tablets What is this medicine? PROCHLORPERAZINE (proe klor PER a zeen) helps to control severe nausea and vomiting. This medicine is also used to treat schizophrenia. It can also help patients who experience anxiety that is not due to psychological illness. This medicine may be used for other purposes; ask your health care provider or pharmacist if you have questions. COMMON BRAND NAME(S): Compazine What should I tell my health care provider before I take this medicine? They need to know if you have any of these conditions: -blood disorders or disease -dementia -liver disease or jaundice -Parkinson's disease -uncontrollable movement disorder -an unusual or allergic reaction to prochlorperazine, other medicines, foods, dyes, or preservatives -pregnant or trying to get pregnant -breast-feeding How should I use this medicine? Take this medicine by mouth with a glass of water. Follow the directions on the prescription label. Take your doses at regular intervals. Do not take your medicine more often than directed. Do not stop taking this medicine suddenly. This can cause nausea, vomiting, and dizziness. Ask your doctor or health care professional for advice. Talk to your pediatrician regarding the use of this medicine in children. Special care may be needed. While  this drug may be prescribed for children as young as 2 years for selected conditions, precautions do apply. Overdosage: If you think you have taken too much of this medicine contact a poison control center or emergency room at once. NOTE: This medicine is only for you. Do not share this medicine with others. What if I miss a dose? If you miss a dose, take it as soon as you can. If it is almost time for your next dose, take only that dose. Do not take double or extra doses. What may interact with this medicine? Do not take this medicine with any of the following medications: -amoxapine -antidepressants like citalopram, escitalopram, fluoxetine, paroxetine, and sertraline -deferoxamine -dofetilide -maprotiline -tricyclic antidepressants like amitriptyline, clomipramine, imipramine, nortiptyline and others This medicine may also interact with the following medications: -lithium -medicines for pain -phenytoin -propranolol -warfarin This list may not describe all possible interactions. Give your health care provider a list of all the medicines, herbs, non-prescription drugs, or dietary supplements you use. Also tell them if you smoke, drink alcohol, or use illegal drugs. Some items may interact with your medicine. What should I watch for while using this medicine? Visit your doctor or health care professional for regular checks on your progress. You may get drowsy or dizzy. Do not drive, use machinery, or do anything that needs mental alertness until you know how this medicine affects you. Do not stand or sit up quickly, especially if you are an older patient. This reduces the risk of dizzy or fainting spells. Alcohol may interfere with the effect of this medicine. Avoid alcoholic drinks. This medicine can reduce the response of your body to heat or cold. Dress warm in cold weather and stay hydrated in hot weather. If possible, avoid extreme temperatures like saunas, hot tubs, very hot or cold  showers, or activities that can cause dehydration such as vigorous exercise. This medicine can make you more sensitive to the sun. Keep out of the sun. If you cannot avoid being in the sun, wear protective clothing and use  sunscreen. Do not use sun lamps or tanning beds/booths. Your mouth may get dry. Chewing sugarless gum or sucking hard candy, and drinking plenty of water may help. Contact your doctor if the problem does not go away or is severe. What side effects may I notice from receiving this medicine? Side effects that you should report to your doctor or health care professional as soon as possible: -blurred vision -breast enlargement in men or women -breast milk in women who are not breast-feeding -chest pain, fast or irregular heartbeat -confusion, restlessness -dark yellow or brown urine -difficulty breathing or swallowing -dizziness or fainting spells -drooling, shaking, movement difficulty (shuffling walk) or rigidity -fever, chills, sore throat -involuntary or uncontrollable movements of the eyes, mouth, head, arms, and legs -seizures -stomach area pain -unusually weak or tired -unusual bleeding or bruising -yellowing of skin or eyes Side effects that usually do not require medical attention (report to your doctor or health care professional if they continue or are bothersome): -difficulty passing urine -difficulty sleeping -headache -sexual dysfunction -skin rash, or itching This list may not describe all possible side effects. Call your doctor for medical advice about side effects. You may report side effects to FDA at 1-800-FDA-1088. Where should I keep my medicine? Keep out of the reach of children. Store at room temperature between 15 and 30 degrees C (59 and 86 degrees F). Protect from light. Throw away any unused medicine after the expiration date. NOTE: This sheet is a summary. It may not cover all possible information. If you have questions about this medicine,  talk to your doctor, pharmacist, or health care provider.  2015, [employer]. (2011-10-06 16:59:39) Ondansetron tablets What is this medicine? ONDANSETRON (on DAN se tron) is used to treat nausea and vomiting caused by chemotherapy. It is also used to prevent or treat nausea and vomiting after surgery. This medicine may be used for other purposes; ask your health care provider or pharmacist if you have questions. COMMON BRAND NAME(S): Zofran What should I tell my health care provider before I take this medicine? They need to know if you have any of these conditions: -heart disease -history of irregular heartbeat -liver disease -low levels of magnesium or potassium in the blood -an unusual or allergic reaction to ondansetron, granisetron, other medicines, foods, dyes, or preservatives -pregnant or trying to get pregnant -breast-feeding How should I use this medicine? Take this medicine by mouth with a glass of water. Follow the directions on your prescription label. Take your doses at regular intervals. Do not take your medicine more often than directed. Talk to your pediatrician regarding the use of this medicine in children. Special care may be needed. Overdosage: If you think you have taken too much of this medicine contact a poison control center or emergency room at once. NOTE: This medicine is only for you. Do not share this medicine with others. What if I miss a dose? If you miss a dose, take it as soon as you can. If it is almost time for your next dose, take only that dose. Do not take double or extra doses. What may interact with this medicine? Do not take this medicine with any of the following medications: -apomorphine -certain medicines for fungal infections like fluconazole, itraconazole, ketoconazole, posaconazole, voriconazole -cisapride -dofetilide -dronedarone -pimozide -thioridazine -ziprasidone This medicine may also interact with the following  medications: -carbamazepine -certain medicines for depression, anxiety, or psychotic disturbances -fentanyl -linezolid -MAOIs like Carbex, Eldepryl, Marplan, Nardil, and Parnate -methylene blue (injected into  a vein) -other medicines that prolong the QT interval (cause an abnormal heart rhythm) -phenytoin -rifampicin -tramadol This list may not describe all possible interactions. Give your health care provider a list of all the medicines, herbs, non-prescription drugs, or dietary supplements you use. Also tell them if you smoke, drink alcohol, or use illegal drugs. Some items may interact with your medicine. What should I watch for while using this medicine? Check with your doctor or health care professional right away if you have any sign of an allergic reaction. What side effects may I notice from receiving this medicine? Side effects that you should report to your doctor or health care professional as soon as possible: -allergic reactions like skin rash, itching or hives, swelling of the face, lips or tongue -breathing problems -confusion -dizziness -fast or irregular heartbeat -feeling faint or lightheaded, falls -fever and chills -loss of balance or coordination -seizures -sweating -swelling of the hands or feet -tightness in the chest -tremors -unusually weak or tired Side effects that usually do not require medical attention (report to your doctor or health care professional if they continue or are bothersome): -constipation or diarrhea -headache This list may not describe all possible side effects. Call your doctor for medical advice about side effects. You may report side effects to FDA at 1-800-FDA-1088. Where should I keep my medicine? Keep out of the reach of children. Store between 2 and 30 degrees C (36 and 86 degrees F). Throw away any unused medicine after the expiration date. NOTE: This sheet is a summary. It may not cover all possible information. If you have  questions about this medicine, talk to your doctor, pharmacist, or health care provider.  2015, [employer]. (2013-02-22 16:27:45) Bevacizumab injection What is this medicine? BEVACIZUMAB (be va SIZ yoo mab) is a chemotherapy drug. It targets a protein found in many cancer cell types, and halts cancer growth. This drug treats many cancers including non-small cell lung cancer, ovarian cancer, cervical cancer, and colon or rectal cancer. It is usually given with other chemotherapy drugs. This medicine may be used for other purposes; ask your health care provider or pharmacist if you have questions. COMMON BRAND NAME(S): Avastin What should I tell my health care provider before I take this medicine? They need to know if you have any of these conditions: -blood clots -heart disease, including heart failure, heart attack, or chest pain (angina) -high blood pressure -infection (especially a virus infection such as chickenpox, cold sores, or herpes) -kidney disease -lung disease -prior chemotherapy with doxorubicin, daunorubicin, epirubicin, or other anthracycline type chemotherapy agents -recent or ongoing radiation therapy -recent surgery -stroke -an unusual or allergic reaction to bevacizumab, hamster proteins, mouse proteins, other medicines, foods, dyes, or preservatives -pregnant or trying to get pregnant -breast-feeding How should I use this medicine? This medicine is for infusion into a vein. It is given by a health care professional in a hospital or clinic setting. Talk to your pediatrician regarding the use of this medicine in children. Special care may be needed. Overdosage: If you think you have taken too much of this medicine contact a poison control center or emergency room at once. NOTE: This medicine is only for you. Do not share this medicine with others. What if I miss a dose? It is important not to miss your dose. Call your doctor or health care professional if you  are unable to keep an appointment. What may interact with this medicine? Interactions are not expected. This list may not  describe all possible interactions. Give your health care provider a list of all the medicines, herbs, non-prescription drugs, or dietary supplements you use. Also tell them if you smoke, drink alcohol, or use illegal drugs. Some items may interact with your medicine. What should I watch for while using this medicine? Your condition will be monitored carefully while you are receiving this medicine. You will need important blood work and urine testing done while you are taking this medicine. During your treatment, let your health care professional know if you have any unusual symptoms, such as difficulty breathing. This medicine may rarely cause 'gastrointestinal perforation' (holes in the stomach, intestines or colon), a serious side effect requiring surgery to repair. This medicine should be started at least 28 days following major surgery and the site of the surgery should be totally healed. Check with your doctor before scheduling dental work or surgery while you are receiving this treatment. Talk to your doctor if you have recently had surgery or if you have a wound that has not healed. Do not become pregnant while taking this medicine. Women should inform their doctor if they wish to become pregnant or think they might be pregnant. There is a potential for serious side effects to an unborn child. Talk to your health care professional or pharmacist for more information. Do not breast-feed an infant while taking this medicine. This medicine has caused ovarian failure in some women. This medicine may interfere with the ability to have a child. You should talk to your doctor or health care professional if you are concerned about your fertility. What side effects may I notice from receiving this medicine? Side effects that you should report to your doctor or health care professional  as soon as possible: -allergic reactions like skin rash, itching or hives, swelling of the face, lips, or tongue -signs of infection - fever or chills, cough, sore throat, pain or trouble passing urine -signs of decreased platelets or bleeding - bruising, pinpoint red spots on the skin, black, tarry stools, nosebleeds, blood in the urine -breathing problems -changes in vision -chest pain -confusion -jaw pain, especially after dental work -mouth sores -seizures -severe abdominal pain -severe headache -sudden numbness or weakness of the face, arm or leg -swelling of legs or ankles -symptoms of a stroke: change in mental awareness, inability to talk or move one side of the body (especially in patients with lung cancer) -trouble passing urine or change in the amount of urine -trouble speaking or understanding -trouble walking, dizziness, loss of balance or coordination Side effects that usually do not require medical attention (report to your doctor or health care professional if they continue or are bothersome): -constipation -diarrhea -dry skin -headache -loss of appetite -nausea, vomiting This list may not describe all possible side effects. Call your doctor for medical advice about side effects. You may report side effects to FDA at 1-800-FDA-1088. Where should I keep my medicine? This drug is given in a hospital or clinic and will not be stored at home. NOTE: This sheet is a summary. It may not cover all possible information. If you have questions about this medicine, talk to your doctor, pharmacist, or health care provider.  2015, Elsevier/Gold Standard. (2013-04-18 11:38:34)

## 2014-08-01 NOTE — Progress Notes (Signed)
Post Porta cath CXR is OK, and he can go from our standpoint.  Dr. Redmond Pulling would like for him to stay on stool softeners, and Miralax to help maintain ongoing bowel movements.  We would also recommend soft diet, and small meals to maintain bowel function and nutrition.

## 2014-08-01 NOTE — Anesthesia Procedure Notes (Signed)
Procedure Name: LMA Insertion Date/Time: 08/01/2014 10:30 AM Performed by: Glory Buff Pre-anesthesia Checklist: Patient identified, Emergency Drugs available, Suction available, Patient being monitored and Timeout performed Patient Re-evaluated:Patient Re-evaluated prior to inductionOxygen Delivery Method: Circle system utilized Preoxygenation: Pre-oxygenation with 100% oxygen Intubation Type: IV induction LMA: LMA inserted LMA Size: 4.0 Number of attempts: 1 Placement Confirmation: positive ETCO2 and breath sounds checked- equal and bilateral Tube secured with: Tape Dental Injury: Teeth and Oropharynx as per pre-operative assessment

## 2014-08-01 NOTE — Progress Notes (Signed)
Portable Chest X-ray done.

## 2014-08-01 NOTE — Progress Notes (Signed)
Went over all discharge information with pt.  All questions answered.  Unable to make appointment with PCP due to office being closed, explained importance of making appointment.  VSS.  Wheeled pt out.

## 2014-08-01 NOTE — Progress Notes (Signed)
2 Days Post-Op  Subjective: No complaints, no BM yesterday aside from some liquid stuff.  He is waiting for [facility] cath placement today.  Objective: Vital signs in last 24 hours: Temp:  [98 F (36.7 C)-98.6 F (37 C)] 98.6 F (37 C) (03/02 0503) Pulse Rate:  [67-72] 72 (03/02 0503) Resp:  [18-19] 19 (03/02 0503) BP: (116-124)/(73-77) 124/76 mmHg (03/02 0503) SpO2:  [99 %-100 %] 100 % (03/02 0503) Last BM Date: 07/31/14 960 PO  NPO now for procedure later. Afebrile, VSS H/H up with transfusions. Intake/Output from previous day: 03/01 0701 - 03/02 0700 In: 1205.4 [P.O.:960; I.V.:245.4] Out: -  Intake/Output this shift:    General appearance: alert, cooperative and no distress GI: soft, non-tender; bowel sounds normal; no masses,  no organomegaly  Lab Results:   Recent Labs  07/31/14 0551 08/01/14 0555  WBC 9.8 9.7  HGB 8.7* 9.1*  HCT 27.4* 28.8*  PLT 478* 496*    BMET  Recent Labs  07/30/14 0602  NA 136  K 4.0  CL 105  CO2 24  GLUCOSE 90  BUN 10  CREATININE 0.64  CALCIUM 8.4   PT/INR No results for input(s): LABPROT, INR in the last 72 hours.   Recent Labs Lab 07/28/14 1440 07/29/14 0847  AST 45* 41*  ALT 27 23  ALKPHOS 157* 148*  BILITOT 0.2* 0.8  PROT 5.8* 6.3  ALBUMIN 2.8* 2.8*     Lipase     Component Value Date/Time   LIPASE 22 07/29/2014 0847     Studies/Results: Ct Chest W Contrast  08/01/2014   CLINICAL DATA:  Stage IV colorectal cancer, assessment for metastatic disease in the chest.  EXAM: CT CHEST WITH CONTRAST  TECHNIQUE: Multidetector CT imaging of the chest was performed during intravenous contrast administration.  CONTRAST:  32mL OMNIPAQUE IOHEXOL 300 MG/ML  SOLN  COMPARISON:  07/30/2014  FINDINGS: Mediastinum/Nodes: Right lower paratracheal node 1.2 cm on image 23 series 2.  Right hilar nodal conglomeration may actually represent 2 adjacent lymph nodes and measures 2.7 by 2.8 cm, image 27 series 2.  Right infrahilar node, 1.4  cm in short axis on image 33 series 2.  Subcarinal node, 1.7 cm in short axis, image 28 series 2. There is an adjacent node below the right mainstem bronchus which measures 1.4 cm on the same image.  Lungs/Pleura: Right lower lobe nodule 0.8 by 1.0 cm, image 29 series 5.  Right upper lobe nodule, 0.3 cm on image 24 series 5.  Sub solid right upper lobe pulmonary nodule, 0.5 by 0.4 cm, image 18 series 5.  Right middle lobe region of airspace opacity/consolidation with surrounding interstitial accentuation and nodularity, image 33 series 5. Subtending airway thickening noted. Main region of consolidations/mass measures proximally 3.4 by 2.3 by 2.2 cm.  0.6 by 0.4 cm nodule in the left upper lobe, image 28 series 5.  Upper abdomen: As noted on recent abdomen CT there are numerous large enhancing masses in the liver. Contrast medium in a new left kidney upper pole calyceal diverticulum.  Musculoskeletal: Unremarkable  IMPRESSION: 1. Pathologic thoracic, right hilar, and infrahilar adenopathy associated with a masslike region of possible consolidation in the right middle lobe, surrounding nodularity, and some scattered bilateral pulmonary nodules. Differential diagnostic considerations favor metastatic disease or synchronous lung cancer, with infectious process such as tuberculosis less likely. 2. Enhancing masses in the liver compatible with metastatic colorectal cancer.   Electronically Signed   By: Van Clines M.D.   On: 08/01/2014  08:47   Ct Abdomen Pelvis W Contrast  07/31/2014   CLINICAL DATA:  49 year old male with abdominal pain, pallor and lack of energy. History of blood in stools noted in October 2015. 20 pounds unintentional weight loss over the past year.  EXAM: CT ABDOMEN AND PELVIS WITH CONTRAST  TECHNIQUE: Multidetector CT imaging of the abdomen and pelvis was performed using the standard protocol following bolus administration of intravenous contrast.  CONTRAST:  90mL OMNIPAQUE IOHEXOL 300 MG/ML  SOLN, 120mL OMNIPAQUE IOHEXOL 300 MG/ML SOLN  COMPARISON:  No priors.  FINDINGS: Lower chest: Some thickening of the peribronchovascular interstitium throughout the right middle lobe and ill-defined central airspace disease is noted in the right middle lobe, incompletely visualized.  Hepatobiliary: Numerous large hypovascular liver lesions concerning for widespread metastatic disease. The largest of these include an 8.2 x 6.7 x 7.7 cm lesion in segment 4B (image 38 of series 2), and a 7.0 x 8.9 x 7.6 cm lesion centered predominantly in segment 8 (image 18 of series 2). Numerous other smaller lesions are also noted. No intra or extrahepatic biliary ductal dilatation. Gallbladder is largely decompressed, but otherwise unremarkable in appearance. Liver is enlarged measuring 24.8 cm in craniocaudal span.  Pancreas: No pancreatic mass. No pancreatic or peripancreatic fluid collection.  Spleen: Unremarkable.  Adrenals/Urinary Tract: Bilateral adrenal glands are normal in appearance. 2.3 cm low-attenuation lesion in the lateral interpolar region of the left kidney is compatible with a simple cyst. In the upper pole of the left kidney there is an exophytic 4.0 cm low-attenuation lesion, however, this lesion demonstrates high attenuation material on the delayed phase, compatible with a large calyceal diverticulum. Other smaller sub cm low-attenuation lesions in the left kidney are too small to definitively characterize, but are favored to represent tiny cysts. Right kidney is normal in appearance. No hydroureteronephrosis. Urinary bladder is normal in appearance.  Stomach/Bowel: In the mid to distal sigmoid colon there is also rather diffuse wall thickening, which is asymmetrically increased along the superior margin of the colon, best appreciated on coronal image 85 of series 602, where this abnormal soft tissue thickening appears to extend up into the mid mesocolon along the course of the inferior mesenteric artery where  there is lymphadenopathy. This mass like soft tissue thickening is irregular in shape and therefore difficult to measure, however, this is estimated to measure approximately 4.2 x 3.6 x 3.9 cm (images 60 of series 2 and image 84 of series 602). Normal appearance of the stomach. No pathologic dilatation of small bowel or colon. Diffuse thickening of the rectum.  Vascular/Lymphatic: Mild atherosclerosis throughout the abdominal and pelvic vasculature, without evidence of aneurysm or dissection. Lymphadenopathy is noted in the sigmoid mesocolon along the course of the inferior mesenteric artery where a lymph node measures up to 1 cm in short axis. Multiple borderline enlarged retroperitoneal lymph nodes are also noted. In addition, several other mesenteric lymph nodes are noted, largest of which are in the ileocolic distribution, measuring up to 1.5 cm in short axis (image 52 of series 2).  Reproductive: Prostate gland and seminal vesicles are normal in appearance.  Other: No significant volume of ascites.  No pneumoperitoneum.  Musculoskeletal: There are no aggressive appearing lytic or blastic lesions noted in the visualized portions of the skeleton.  IMPRESSION: 1. Findings, as above, concerning for primary colorectal neoplasm in the mid to distal sigmoid colon, with lymphadenopathy in the sigmoid mesocolon, ileocolic mesentery, and retroperitoneum, as well as widespread metastatic disease to the liver, as  detailed above. 2. Central airspace consolidation in the right middle lobe with septal thickening throughout the right middle lobe. These findings are nonspecific, and incompletely visualized, but raise concern for a centrally obstructing lesion (potentially a centrally obstructing metastasis and). Consider further evaluation of the chest with contrast enhanced chest CT in the near future to better evaluate this finding. 3. Multiple left renal lesions appear compatible with simple cysts and a large calyceal  diverticulum, as above. 4. Additional incidental findings, as above.   Electronically Signed   By: Vinnie Langton M.D.   On: 07/31/2014 08:10    Medications: .  ceFAZolin (ANCEF) IV  2 g Intravenous On Call to OR  . ferrous sulfate  325 mg Oral BID  . pantoprazole (PROTONIX) IV  40 mg Intravenous Q12H  . sodium chloride  3 mL Intravenous Q12H    Assessment/Plan 1. Sigmoid colon mass with possible metastasis  2. 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 Anemia with recent transfusion 4.  SCD's for DVT ordered.  Plan:  [place] cath placement to be followed by out patient chemotherapy at this point.   LOS: 4 days    Kijuana Ruppel 08/01/2014

## 2014-08-01 NOTE — Discharge Summary (Signed)
Physician Discharge Summary  Tom [name] DGU:[id number] DOB: 09-Jun-1965 DOA: 07/28/2014  PCP: No primary care provider on file.  Admit date: 07/28/2014 Discharge date: 08/01/2014  Recommendations for Outpatient Follow-up:  1. Pt will need to follow up with PCP in 2-3 weeks post discharge 2. Please obtain BMP to evaluate electrolytes and kidney function 3. Please also check CBC to evaluate Hg and Hct levels 4. Post Porta cath CXR is OK, per surgery would like for him to stay on stool softeners, and Miralax to help maintain ongoing bowel movements, also recommend soft diet, and small meals to maintain bowel function and nutrition 5. Per Dr. Benay Spice, oncologist Dr. Whitney Muse will see pt at the [facility] within a few days of discharge.  Discharge Diagnoses:  Principal Problem:   GI bleed Active Problems:   Microcytic anemia   Abdominal pain   Leukocytosis   Epidermoid cyst   Malignant neoplasm of sigmoid colon   Colonic neoplasm  Discharge Condition: Stable  Diet recommendation:soft diet   History of present illness:  49 yo white male who went to an urgent care clinic for lack of energy, abdominal pain and pallor. Urgent care told him to go to the ER due to a low hemoglobin (7.3). He noticed blood in his stool in October. Daily bowel movements are soft green/brown color. Over the past year, pt states he has had a 20 pound unintentional weight loss. Denied fever, chest pain, night sweats, syncope. Denies FH of colon cancer.   Assessment/Plan: Sigmoid Colon Mass - Colonoscopy 2/29 showed a tumor 12 cm from anus, unable to pass the scope. 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 No pneumothorax identified.    Ct Chest W Contrast  08/01/2014   Pathologic thoracic, right hilar, and infrahilar adenopathy associated with a masslike region of possible consolidation in the right middle lobe, surrounding nodularity, and some scattered bilateral pulmonary nodules. Differential diagnostic considerations favor metastatic disease or synchronous lung cancer, with infectious process such as tuberculosis less likely. 2. Enhancing masses in the liver compatible with metastatic colorectal cancer.    US Abdomen Complete  07/29/2014  Multiple heterogeneous mass lesions throughout the liver, some with central cavitation. 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     Ct Abdomen Pelvis W Contrast  07/31/2014  Findings, as above, concerning for primary colorectal neoplasm in the mid to distal sigmoid colon, with lymphadenopathy in the sigmoid mesocolon, ileocolic mesentery, and retroperitoneum, as well as widespread metastatic  disease to the liver, as detailed above. 2. Central airspace consolidation in the right middle lobe with septal thickening throughout the right middle lobe. These findings are nonspecific, and incompletely visualized, but raise concern for a centrally obstructing lesion  (potentially a centrally obstructing metastasis and). Consider further evaluation of the chest with contrast enhanced chest CT in the near future to better evaluate this finding. 3. Multiple left renal lesions appear compatible with simple cysts and a large calyceal diverticulum, as above. 4. Additional incidental findings, as above.    Dg Chest Portable 1 View  08/01/2014   Port-A-Cath tip cavoatrial junction.  No pneumothorax.     Discharge Exam: Filed Vitals:   08/01/14 1335  BP: 118/68  Pulse: 71  Temp: 98.2 F (36.8 C)  Resp: 14   Filed Vitals:   08/01/14 1249 08/01/14 1300 08/01/14 1315 08/01/14 1335  BP:  122/67 115/69 118/68  Pulse: 57 63 57 71  Temp:   98.8 F (37.1 C) 98.2 F (36.8 C)  TempSrc:      Resp: 17 12 12 14   Height:      Weight:      SpO2: 100% 100% 100% 100%    General: Pt is alert, follows commands appropriately, not in acute distress Cardiovascular: Regular rate and rhythm, S1/S2 +, no murmurs, no rubs, no gallops Respiratory: Clear to auscultation bilaterally, no wheezing, no crackles, no rhonchi Abdominal: Soft, non tender, non distended, bowel sounds +, no guarding Extremities: no cyanosis, pulses palpable bilaterally DP and PT Neuro: Grossly nonfocal  Discharge Instructions     Medication List    TAKE these medications        docusate sodium 100 MG capsule  Commonly known as:  COLACE  Take 1 capsule (100 mg total) by mouth 2 (two) times daily.     ferrous sulfate 325 (65 FE) MG tablet  Take 1 tablet (325 mg total) by mouth 2 (two) times daily.     ibuprofen 200 MG tablet  Commonly known as:  ADVIL,MOTRIN  Take 60 mg by mouth every 6 (six) hours as needed for mild pain.     leucovorin in dextrose 5 % 250 mL  Inject into the vein once. Every 14 days. To begin Monday 08/06/14     lidocaine-prilocaine cream  Commonly known as:  EMLA  Apply a quarter size amount to port site 1 hour prior to chemo. Do not rub in. Cover with plastic wrap.      multivitamin with minerals Tabs tablet  Take 1 tablet by mouth daily.     ondansetron 8 MG tablet  Commonly known as:  ZOFRAN  Take 1 tablet (8 mg total) by mouth every 8 (eight) hours as needed for nausea or vomiting.     OXALIPLATIN IV  Inject into the vein. Every 14 days. To begin Monday 08/06/14     oxyCODONE-acetaminophen 5-325 MG per tablet  Commonly known as:  PERCOCET/ROXICET  Take 1-2 tablets by mouth every 4 (four) hours as needed for moderate pain.     polyethylene glycol packet  Commonly known as:  MIRALAX / GLYCOLAX  Take 17 g by mouth daily as needed for mild constipation.     prochlorperazine 10 MG tablet  Commonly known as:  COMPAZINE  Take 1 tablet (10 mg total) by mouth every 6 (six) hours as needed for nausea or vomiting.     sodium chloride 0.9 % SOLN 150 mL with fluorouracil 5 GM/100ML SOLN 200 mg/m2/day  Inject 200  mg/m2/day into the vein. Every 14 days. To begin Monday 08/06/14. To infuse over 46 hours.          The results of significant diagnostics from this hospitalization (including imaging, microbiology, ancillary and laboratory) are listed below for reference.     Microbiology: No results found for this or any previous visit (from the past 240 hour(s)).   Labs: Basic Metabolic Panel:  Recent Labs Lab 07/28/14 1440 07/29/14 0847 07/30/14 0602  NA 140 136 136  K 4.0 4.0 4.0  CL 109 105 105  CO2 25 26 24   GLUCOSE 110* 90 90  BUN 18 14 10   CREATININE 0.81 0.72 0.64  CALCIUM 8.3* 8.4 8.4   Liver Function Tests:  Recent Labs Lab 07/28/14 1440 07/29/14 0847  AST 45* 41*  ALT 27 23  ALKPHOS 157* 148*  BILITOT 0.2* 0.8  PROT 5.8* 6.3  ALBUMIN 2.8* 2.8*    Recent Labs Lab 07/29/14 0847  LIPASE 22   CBC:  Recent Labs Lab 07/28/14 1440 07/29/14 0847 07/30/14 0602 07/31/14 0551 08/01/14 0555  WBC 12.4* 12.1* 11.1* 9.8 9.7  NEUTROABS 9.0*  --   --   --   --   HGB 7.3* 9.3* 8.5* 8.7* 9.1*  HCT 24.3* 29.7* 27.4* 27.4*  28.8*  MCV 72.1* 74.1* 73.9* 73.7* 73.1*  PLT 575* 507* 490* 478* 496*   CBG:  Recent Labs Lab 07/29/14 1146 07/29/14 1638 07/29/14 2246  GLUCAP 70 96 78    SIGNED: Time coordinating discharge: Over 30 minutes  Faye Ramsay, MD  [facility] 08/01/2014, 3:54 PM Pager [phone]  If 7PM-7AM, please contact night-coverage [email or web address] Password [email or web address]

## 2014-08-01 NOTE — Progress Notes (Signed)
Further discussion with [name] this morning. He is scheduled for a Port-A-Cath placement today.  I discussed the case with Dr. Whitney Muse. She will see him at the [facility] within a few days of discharge.  Please call oncology as needed while he is here.

## 2014-08-02 ENCOUNTER — Encounter (HOSPITAL_COMMUNITY): Payer: Self-pay | Admitting: Oncology

## 2014-08-02 ENCOUNTER — Encounter (HOSPITAL_COMMUNITY): Payer: 59 | Attending: Oncology | Admitting: Oncology

## 2014-08-02 ENCOUNTER — Encounter (HOSPITAL_BASED_OUTPATIENT_CLINIC_OR_DEPARTMENT_OTHER): Payer: 59

## 2014-08-02 VITALS — BP 111/73 | HR 80 | Temp 98.7°F | Resp 16 | Ht 67.0 in | Wt 182.6 lb

## 2014-08-02 DIAGNOSIS — C187 Malignant neoplasm of sigmoid colon: Secondary | ICD-10-CM | POA: Insufficient documentation

## 2014-08-02 DIAGNOSIS — C787 Secondary malignant neoplasm of liver and intrahepatic bile duct: Secondary | ICD-10-CM

## 2014-08-02 DIAGNOSIS — C189 Malignant neoplasm of colon, unspecified: Secondary | ICD-10-CM

## 2014-08-02 DIAGNOSIS — Z23 Encounter for immunization: Secondary | ICD-10-CM

## 2014-08-02 MED ORDER — INFLUENZA VAC SPLIT QUAD 0.5 ML IM SUSY
0.5000 mL | PREFILLED_SYRINGE | Freq: Once | INTRAMUSCULAR | Status: AC
  Administered 2014-08-02: 0.5 mL via INTRAMUSCULAR
  Filled 2014-08-02: qty 0.5

## 2014-08-02 NOTE — Progress Notes (Addendum)
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  HISTORY OF PRESENT ILLNESS:   Tom [name] is a 49 year old man who presented to the [facility] ED on 07/28/2014 with severe iron deficiency anemia.  He was subsequently transferred to [facility] in [place] due to lack of GI coverage over weekend.  Dr. Benay Spice called [facility] to get the patient involved in our clinic and we worked him in today after being discharged yesterday.      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 Appearance is most likely to represent diffuse hepatic metastasis   07/30/2014 Initial Diagnosis Colon, biopsy, sigmoid - ADENOCARCINOMA.   07/31/2014 Tumor Marker CEA- 48.0   07/31/2014 Imaging Ct abd/pelvis- concerning for primary colorectal neoplasm in the mid to distal sigmoid colon, with lymphadenopathy in the sigmoid mesocolon, ileocolic mesentery, and retroperitoneum, as well as widespread metastatic disease to the liver   08/01/2014 Imaging CT chest- Pathologic thoracic, right hilar, and infrahilar adenopathy associated with a masslike region of possible consolidation in the right middle lobe, surrounding nodularity, and some scattered bilateral pulmonary nodules.   I personally reviewed and went over laboratory results with the patient.  The results are noted within this  dictation.  I personally reviewed and went over radiographic studies with the patient.  The results are noted within this dictation.    I personally reviewed and went over pathology results with the patient.  He is educated about his Stage IV adenocarcinoma of colon diagnosis.  He is taught that this cancer is not curable, but it can be treated.  I have offered the patient systemic chemotherapy consisting of FOLFOX + Avastin (Avastin being held for first cycle due to recent port-a-cath insertion).  We reviewed the risks, benefits, alternatives, and side effects of therapy including but not limited to nausea, vomiting, diarrhea, constipation, decreased blood counts, cold intolerance, and peripheral neuropathy.  He has a port in place and understands its role.  Goal of therapy is palliative.  I provided him the definition of palliative chemotherapy.  I answered all of his questions.  He underwent chemotherapy teaching at the conclusion of today's visit in preparation for chemotherapy on Monday.  Oncologically, he denies any complaints and ROS questioning is negative.  PAST MEDICAL HISTORY:   Past Medical History  Diagnosis Date  . Adenocarcinoma of colon metastatic to liver 07/30/2014    ALLERGIES: No Known Allergies    MEDICATIONS: I have reviewed the patient's current medications.     PAST SURGICAL HISTORY Past Surgical History  Procedure Laterality Date  . Esophagogastroduodenoscopy N/A 07/30/2014    Procedure: ESOPHAGOGASTRODUODENOSCOPY (EGD);  Surgeon: Inda Castle, MD;  Location: [facility];  Service: Endoscopy;  Laterality: N/A;  . Colonoscopy N/A 07/30/2014    Procedure: COLONOSCOPY;  Surgeon: Inda Castle, MD;  Location: [facility];  Service: Endoscopy;  Laterality: N/A;  . Portacath placement Right  08/01/14    FAMILY HISTORY: Family History  Problem Relation Age of Onset  . Cancer Mother     Deceased at 48 years old from metastatic colon cancer  . Kidney failure  Father   . Heart disease Father     Deceased in 40-50's    SOCIAL HISTORY:  reports that he has never smoked. He quit smokeless tobacco use about 25 years ago. His smokeless tobacco use included Chew. He reports that he does not drink alcohol or use illicit drugs.  PERFORMANCE STATUS: The patient's performance status is 0 - Asymptomatic  PHYSICAL EXAM: Most Recent Vital Signs: Blood pressure 111/73, pulse 80, temperature 98.7 F (37.1 C), temperature source Oral, resp. rate 16, height 5' 7"  (1.702 m), weight 182 lb 9.6 oz (82.827 kg), SpO2 100 %. General appearance: alert, cooperative and appears stated age Head: Normocephalic, without obvious abnormality, atraumatic Eyes: conjunctivae/corneas clear. PERRL, EOM's intact. Fundi benign. Ears: normal TM's and external ear canals both ears Nose: Nares normal. Septum midline. Mucosa normal. No drainage or sinus tenderness. Throat: lips, mucosa, and tongue normal; teeth and gums normal Neck: no adenopathy and supple, symmetrical, trachea midline Back: symmetric, no curvature. ROM normal. No CVA tenderness. Lungs: clear to auscultation bilaterally and normal percussion bilaterally Heart: regular rate and rhythm, S1, S2 normal, no murmur, click, rub or gallop Abdomen: abnormal findings:  hepatomegaly, liver edge palpable 8 cm below costal margin Extremities: extremities normal, atraumatic, no cyanosis or edema Pulses: 2+ and symmetric Skin: Skin color, texture, turgor normal. No rashes or lesions Lymph nodes: Cervical, supraclavicular, and axillary nodes normal. Neurologic: Alert and oriented X 3, normal strength and tone. Normal symmetric reflexes. Normal coordination and gait  LABORATORY DATA:  Results for orders placed or performed during the hospital encounter of 07/28/14 (from the past 48 hour(s))  CBC     Status: Abnormal   Collection Time: 08/01/14  5:55 AM  Result Value Ref Range   WBC 9.7 4.0 - 10.5 K/uL   RBC 3.94 (L) 4.22 -  5.81 MIL/uL   Hemoglobin 9.1 (L) 13.0 - 17.0 g/dL   HCT 28.8 (L) 39.0 - 52.0 %   MCV 73.1 (L) 78.0 - 100.0 fL   MCH 23.1 (L) 26.0 - 34.0 pg   MCHC 31.6 30.0 - 36.0 g/dL   RDW 17.1 (H) 11.5 - 15.5 %   Platelets 496 (H) 150 - 400 K/uL      RADIOGRAPHY: Dg Chest 1 View  08/01/2014   CLINICAL DATA:  Port-A-Cath placement.  EXAM: CHEST  1 VIEW  COMPARISON:  Earlier same day.  FINDINGS: Right IJ Port-A-Cath is present with tip over the approximate region of the cavoatrial junction/ upper right atrium. No definite pneumothorax visualized. Remainder of the exam is unchanged.  IMPRESSION: Right IJ Port-A-Cath with tip in the approximate position of the upper right atrium. No pneumothorax identified.  Findings were with Dr. Redmond Pulling in the OR at 11:42 a.m. by Dr. Lorin Picket.   Electronically Signed   By: Marin Olp M.D.   On: 08/01/2014 12:51   Ct Chest W Contrast  08/01/2014   CLINICAL DATA:  Stage IV colorectal cancer, assessment for metastatic disease in the chest.  EXAM: CT CHEST WITH CONTRAST  TECHNIQUE: Multidetector CT imaging of the chest was performed during intravenous contrast administration.  CONTRAST:  66m OMNIPAQUE IOHEXOL 300 MG/ML  SOLN  COMPARISON:  07/30/2014  FINDINGS: Mediastinum/Nodes: Right lower paratracheal node 1.2 cm on image 23 series 2.  Right hilar  nodal conglomeration may actually represent 2 adjacent lymph nodes and measures 2.7 by 2.8 cm, image 27 series 2.  Right infrahilar node, 1.4 cm in short axis on image 33 series 2.  Subcarinal node, 1.7 cm in short axis, image 28 series 2. There is an adjacent node below the right mainstem bronchus which measures 1.4 cm on the same image.  Lungs/Pleura: Right lower lobe nodule 0.8 by 1.0 cm, image 29 series 5.  Right upper lobe nodule, 0.3 cm on image 24 series 5.  Sub solid right upper lobe pulmonary nodule, 0.5 by 0.4 cm, image 18 series 5.  Right middle lobe region of airspace opacity/consolidation with surrounding interstitial  accentuation and nodularity, image 33 series 5. Subtending airway thickening noted. Main region of consolidations/mass measures proximally 3.4 by 2.3 by 2.2 cm.  0.6 by 0.4 cm nodule in the left upper lobe, image 28 series 5.  Upper abdomen: As noted on recent abdomen CT there are numerous large enhancing masses in the liver. Contrast medium in a new left kidney upper pole calyceal diverticulum.  Musculoskeletal: Unremarkable  IMPRESSION: 1. Pathologic thoracic, right hilar, and infrahilar adenopathy associated with a masslike region of possible consolidation in the right middle lobe, surrounding nodularity, and some scattered bilateral pulmonary nodules. Differential diagnostic considerations favor metastatic disease or synchronous lung cancer, with infectious process such as tuberculosis less likely. 2. Enhancing masses in the liver compatible with metastatic colorectal cancer.   Electronically Signed   By: Van Clines M.D.   On: 08/01/2014 08:47   Dg Chest Portable 1 View  08/01/2014   CLINICAL DATA:  Port-A-Cath placement.  FLUOROSCOPY TIME:  See operative note.  PROCEDURE: AP chest:  Single AP view of the chest demonstrates Center venous catheter tip at the cavoatrial junction. The entire catheter is not visualized. RIGHT hilar adenopathy is present. Scarring is present LEFT base.  IMPRESSION: Port-A-Cath tip cavoatrial junction.  No pneumothorax.   Electronically Signed   By: Rolla Flatten M.D.   On: 08/01/2014 12:59   Dg C-arm 1-60 Min-no Report  08/01/2014   CLINICAL DATA:  Port-A-Cath placement.  FLUOROSCOPY TIME:  See operative note.  PROCEDURE: AP chest:  Single AP view of the chest demonstrates Center venous catheter tip at the cavoatrial junction. The entire catheter is not visualized. RIGHT hilar adenopathy is present. Scarring is present LEFT base.  IMPRESSION: Port-A-Cath tip cavoatrial junction.  No pneumothorax.   Electronically Signed   By: Rolla Flatten M.D.   On: 08/01/2014 12:59        PATHOLOGY:     Colon, biopsy, sigmoid - ADENOCARCINOMA.    ASSESSMENT/PLAN: Adenocarcinoma of colon metastatic to liver Newly diagnosed adenocarcinoma of colon metastatic to liver and lungs giving him Stage IV disease.  KRAS status not identified.  I called pathology today requesting this test be completed to help guide future therapy following failure of firth line treatment.  He is S/P port-a-cath placement on 08/01/2014.  Oncology history developed.  Treatment plan developed and built consisting of FOLFOX + Avastin (Avastin being held for first cycle due to port placement).  Risks, benefits, alternatives, and side effects of therapy discussed. Chemotherapy teaching completed today.   Pre-chemo labs: CBC diff, CMET every 2 weeks and CEA every 4 weeks.  Starting chemotherapy on Monday, 3/7 for cycle 1 of therapy.  CEA is to be drawn on day 1, cycle 1.  All questions are answered.  Return in 2 weeks for follow-up.    All questions  were answered. The patient knows to call the clinic with any problems, questions or concerns. We can certainly see the patient much sooner if necessary.  Patient and plan discussed with Dr. Ancil Linsey and she is in agreement with the aforementioned.   More than 50% of the time spent with the patient was utilized for counseling and coordination of care.  This note is electronically signed by: Robynn Pane 08/02/2014 4:21 PM

## 2014-08-02 NOTE — Progress Notes (Signed)
Chemo teaching done and consent signed for Oxaliplatin, Leucovorin, 5FU, Avastin. Distress screening done. Meds already called in to drug store. Calendar given to patient.

## 2014-08-02 NOTE — Progress Notes (Signed)
Tom [name] presents today for injection per MD orders. FLU vaccine administered IM in left deltoid. Administration without incident. Patient tolerated well.

## 2014-08-02 NOTE — Patient Instructions (Addendum)
[place] at [facility] Discharge Instructions  RECOMMENDATIONS MADE BY THE CONSULTANT AND ANY TEST RESULTS WILL BE SENT TO YOUR REFERRING PHYSICIAN.  Exam and discussion by Robynn Pane, PA-C.  You have cancer of your colon. We will start chemotherapy next week.  You will receive Oxalaplatin, Leucovorin and 5 Fluorouracil IV push and by Infusional pump.  We will add Avastin later due to your recent port placement. Chemotherapy Teaching done by Lupita Raider.  For questions or concerns call our office.  Chemotherapy as scheduled You will see Robynn Pane after your 1st treatment to see how you are doing.  Thank you for choosing [place] at [facility] to provide your oncology and hematology care.  To afford each patient quality time with our provider, please arrive at least 15 minutes before your scheduled appointment time.    You need to re-schedule your appointment should you arrive 10 or more minutes late.  We strive to give you quality time with our providers, and arriving late affects you and other patients whose appointments are after yours.  Also, if you no show three or more times for appointments you may be dismissed from the clinic at the providers discretion.     Again, thank you for choosing [facility].  Our hope is that these requests will decrease the amount of time that you wait before being seen by our physicians.       _____________________________________________________________  Should you have questions after your visit to [facility], please contact our office at [phone] between the hours of 8:30 a.m. and 4:30 p.m.  Voicemails left after 4:30 p.m. will not be returned until the following business day.  For prescription refill requests, have your pharmacy contact our office.

## 2014-08-02 NOTE — Assessment & Plan Note (Addendum)
Newly diagnosed adenocarcinoma of colon metastatic to liver and lungs giving him Stage IV disease.  KRAS status not identified.  I called pathology today requesting this test be completed to help guide future therapy following failure of firth line treatment.  He is S/P port-a-cath placement on 08/01/2014.  Oncology history developed.  Treatment plan developed and built consisting of FOLFOX + Avastin (Avastin being held for first cycle due to port placement).  Risks, benefits, alternatives, and side effects of therapy discussed. Chemotherapy teaching completed today.   Pre-chemo labs: CBC diff, CMET every 2 weeks and CEA every 4 weeks.  Starting chemotherapy on Monday, 3/7 for cycle 1 of therapy.  CEA is to be drawn on day 1, cycle 1.  All questions are answered.  Return in 2 weeks for follow-up.

## 2014-08-03 ENCOUNTER — Encounter (HOSPITAL_COMMUNITY): Payer: Self-pay | Admitting: General Surgery

## 2014-08-06 ENCOUNTER — Encounter (HOSPITAL_COMMUNITY): Payer: Self-pay

## 2014-08-06 ENCOUNTER — Encounter (HOSPITAL_BASED_OUTPATIENT_CLINIC_OR_DEPARTMENT_OTHER): Payer: 59

## 2014-08-06 VITALS — BP 127/65 | HR 70 | Temp 97.8°F | Resp 20 | Wt 179.0 lb

## 2014-08-06 DIAGNOSIS — C787 Secondary malignant neoplasm of liver and intrahepatic bile duct: Secondary | ICD-10-CM

## 2014-08-06 DIAGNOSIS — C187 Malignant neoplasm of sigmoid colon: Secondary | ICD-10-CM

## 2014-08-06 DIAGNOSIS — Z5111 Encounter for antineoplastic chemotherapy: Secondary | ICD-10-CM | POA: Diagnosis not present

## 2014-08-06 DIAGNOSIS — D500 Iron deficiency anemia secondary to blood loss (chronic): Secondary | ICD-10-CM

## 2014-08-06 DIAGNOSIS — C189 Malignant neoplasm of colon, unspecified: Secondary | ICD-10-CM

## 2014-08-06 DIAGNOSIS — D72829 Elevated white blood cell count, unspecified: Secondary | ICD-10-CM

## 2014-08-06 LAB — COMPREHENSIVE METABOLIC PANEL
ALBUMIN: 2.3 g/dL — AB (ref 3.5–5.2)
ALT: 43 U/L (ref 0–53)
ANION GAP: 8 (ref 5–15)
AST: 47 U/L — ABNORMAL HIGH (ref 0–37)
Alkaline Phosphatase: 192 U/L — ABNORMAL HIGH (ref 39–117)
BUN: 19 mg/dL (ref 6–23)
CO2: 24 mmol/L (ref 19–32)
CREATININE: 1 mg/dL (ref 0.50–1.35)
Calcium: 8.5 mg/dL (ref 8.4–10.5)
Chloride: 105 mmol/L (ref 96–112)
GFR calc Af Amer: >90 mL/min (ref >90)
GFR calc non Af Amer: 87 mL/min — ABNORMAL LOW (ref >90)
Glucose, Bld: 109 mg/dL — ABNORMAL HIGH (ref 70–99)
Potassium: 3.9 mmol/L (ref 3.5–5.1)
Sodium: 137 mmol/L (ref 135–145)
Total Bilirubin: 0.2 mg/dL — ABNORMAL LOW (ref 0.3–1.2)
Total Protein: 6 g/dL (ref 6.0–8.3)

## 2014-08-06 LAB — CBC WITH DIFFERENTIAL/PLATELET
BASOS ABS: 0 10*3/uL (ref 0.0–0.1)
BASOS PCT: 0 % (ref 0–1)
EOS PCT: 3 % (ref 0–5)
Eosinophils Absolute: 0.2 10*3/uL (ref 0.0–0.7)
HCT: 27 % — ABNORMAL LOW (ref 39.0–52.0)
Hemoglobin: 8.3 g/dL — ABNORMAL LOW (ref 13.0–17.0)
Lymphocytes Relative: 13 % (ref 12–46)
Lymphs Abs: 1.2 10*3/uL (ref 0.7–4.0)
MCH: 22.5 pg — ABNORMAL LOW (ref 26.0–34.0)
MCHC: 30.7 g/dL (ref 30.0–36.0)
MCV: 73.2 fL — ABNORMAL LOW (ref 78.0–100.0)
MONO ABS: 1 10*3/uL (ref 0.1–1.0)
Monocytes Relative: 10 % (ref 3–12)
NEUTROS ABS: 6.9 10*3/uL (ref 1.7–7.7)
Neutrophils Relative %: 74 % (ref 43–77)
PLATELETS: 555 10*3/uL — AB (ref 150–400)
RBC: 3.69 MIL/uL — ABNORMAL LOW (ref 4.22–5.81)
RDW: 17.6 % — AB (ref 11.5–15.5)
WBC: 9.3 10*3/uL (ref 4.0–10.5)

## 2014-08-06 LAB — PREPARE RBC (CROSSMATCH)

## 2014-08-06 MED ORDER — SODIUM CHLORIDE 0.9 % IJ SOLN: 10.0000 mL | INTRAMUSCULAR | Status: DC | PRN

## 2014-08-06 MED ORDER — DEXTROSE 5 % IV SOLN
Freq: Once | INTRAVENOUS | Status: AC
  Administered 2014-08-06: 10:00:00 via INTRAVENOUS

## 2014-08-06 MED ORDER — SODIUM CHLORIDE 0.9 % IV SOLN
Freq: Once | INTRAVENOUS | Status: AC
  Administered 2014-08-06: 8 mg via INTRAVENOUS
  Filled 2014-08-06: qty 4

## 2014-08-06 MED ORDER — LEUCOVORIN CALCIUM INJECTION 350 MG
400.0000 mg/m2 | Freq: Once | INTRAVENOUS | Status: AC
  Administered 2014-08-06: 792 mg via INTRAVENOUS
  Filled 2014-08-06: qty 39.6

## 2014-08-06 MED ORDER — SODIUM CHLORIDE 0.9 % IV SOLN: 8.0000 mg | Freq: Once | INTRAVENOUS | Status: DC

## 2014-08-06 MED ORDER — DEXAMETHASONE SODIUM PHOSPHATE 10 MG/ML IJ SOLN: 10.0000 mg | Freq: Once | INTRAMUSCULAR | Status: DC

## 2014-08-06 MED ORDER — SODIUM CHLORIDE 0.9 % IV SOLN
2400.0000 mg/m2 | INTRAVENOUS | Status: DC
  Administered 2014-08-06: 4750 mg via INTRAVENOUS
  Filled 2014-08-06: qty 95

## 2014-08-06 MED ORDER — FLUOROURACIL CHEMO INJECTION 2.5 GM/50ML
400.0000 mg/m2 | Freq: Once | INTRAVENOUS | Status: AC
  Administered 2014-08-06: 800 mg via INTRAVENOUS
  Filled 2014-08-06: qty 16

## 2014-08-06 MED ORDER — OXALIPLATIN CHEMO INJECTION 100 MG/20ML
85.0000 mg/m2 | Freq: Once | INTRAVENOUS | Status: AC
  Administered 2014-08-06: 170 mg via INTRAVENOUS
  Filled 2014-08-06: qty 34

## 2014-08-06 NOTE — Progress Notes (Signed)
Tolerated first chemo well.  Continuous pump infusion education completed.To return on 3/9 for pump removal.

## 2014-08-07 LAB — IRON AND TIBC
Iron: <10 ug/dL — ABNORMAL LOW (ref 42–165)
UIBC: 246 ug/dL (ref 125–400)

## 2014-08-07 LAB — FERRITIN: Ferritin: 71 ng/mL (ref 22–322)

## 2014-08-07 LAB — FOLATE

## 2014-08-07 LAB — CEA: CEA: 35.4 ng/mL — ABNORMAL HIGH (ref 0.0–4.7)

## 2014-08-07 LAB — VITAMIN B12: Vitamin B-12: 873 pg/mL (ref 211–911)

## 2014-08-08 ENCOUNTER — Encounter (HOSPITAL_BASED_OUTPATIENT_CLINIC_OR_DEPARTMENT_OTHER): Payer: 59

## 2014-08-08 ENCOUNTER — Encounter (HOSPITAL_COMMUNITY): Payer: Self-pay

## 2014-08-08 DIAGNOSIS — C787 Secondary malignant neoplasm of liver and intrahepatic bile duct: Secondary | ICD-10-CM

## 2014-08-08 DIAGNOSIS — C7802 Secondary malignant neoplasm of left lung: Secondary | ICD-10-CM

## 2014-08-08 DIAGNOSIS — C187 Malignant neoplasm of sigmoid colon: Secondary | ICD-10-CM

## 2014-08-08 DIAGNOSIS — Z452 Encounter for adjustment and management of vascular access device: Secondary | ICD-10-CM

## 2014-08-08 DIAGNOSIS — C7801 Secondary malignant neoplasm of right lung: Secondary | ICD-10-CM

## 2014-08-08 MED ORDER — HEPARIN SOD (PORK) LOCK FLUSH 100 UNIT/ML IV SOLN
INTRAVENOUS | Status: AC
Start: 2014-08-08 — End: 2014-08-08
  Filled 2014-08-08: qty 5

## 2014-08-08 MED ORDER — SODIUM CHLORIDE 0.9 % IJ SOLN
10.0000 mL | INTRAMUSCULAR | Status: AC | PRN
  Administered 2014-08-08: 10 mL via INTRAVENOUS
  Filled 2014-08-08: qty 10

## 2014-08-08 MED ORDER — HEPARIN SOD (PORK) LOCK FLUSH 100 UNIT/ML IV SOLN
500.0000 [IU] | Freq: Once | INTRAVENOUS | Status: AC
  Administered 2014-08-08: 500 [IU] via INTRAVENOUS

## 2014-08-08 NOTE — Progress Notes (Unsigned)
Tom [name] presented for pump de-access and Portacath flush.  Proper placement of portacath confirmed by CXR.  Portacath located right chest wall accessed with  H 20 needle.  Good blood return present. Portacath flushed with 73ml NS and 500U/74ml Heparin and needle removed intact.  Procedure tolerated well and without incident - no concerns or complaints after receiving chemo.

## 2014-08-09 ENCOUNTER — Encounter (HOSPITAL_BASED_OUTPATIENT_CLINIC_OR_DEPARTMENT_OTHER): Payer: 59

## 2014-08-09 ENCOUNTER — Encounter (HOSPITAL_COMMUNITY): Payer: Self-pay

## 2014-08-09 VITALS — BP 113/61 | HR 67 | Temp 98.6°F | Resp 18

## 2014-08-09 DIAGNOSIS — D509 Iron deficiency anemia, unspecified: Secondary | ICD-10-CM

## 2014-08-09 DIAGNOSIS — C187 Malignant neoplasm of sigmoid colon: Secondary | ICD-10-CM

## 2014-08-09 DIAGNOSIS — D500 Iron deficiency anemia secondary to blood loss (chronic): Secondary | ICD-10-CM

## 2014-08-09 MED ORDER — DIPHENHYDRAMINE HCL 25 MG PO CAPS
25.0000 mg | ORAL_CAPSULE | Freq: Once | ORAL | Status: AC
  Administered 2014-08-09: 25 mg via ORAL

## 2014-08-09 MED ORDER — ACETAMINOPHEN 325 MG PO TABS
650.0000 mg | ORAL_TABLET | Freq: Once | ORAL | Status: AC
  Administered 2014-08-09: 650 mg via ORAL

## 2014-08-09 MED ORDER — SODIUM CHLORIDE 0.9 % IV SOLN
250.0000 mL | Freq: Once | INTRAVENOUS | Status: AC
  Administered 2014-08-09: 250 mL via INTRAVENOUS

## 2014-08-09 MED ORDER — SODIUM CHLORIDE 0.9 % IJ SOLN
10.0000 mL | INTRAMUSCULAR | Status: AC | PRN
  Administered 2014-08-09: 10 mL

## 2014-08-09 MED ORDER — HEPARIN SOD (PORK) LOCK FLUSH 100 UNIT/ML IV SOLN
500.0000 [IU] | Freq: Every day | INTRAVENOUS | Status: AC | PRN
  Administered 2014-08-09: 500 [IU]
  Filled 2014-08-09: qty 5

## 2014-08-09 MED ORDER — ACETAMINOPHEN 325 MG PO TABS
ORAL_TABLET | ORAL | Status: AC
  Filled 2014-08-09: qty 2

## 2014-08-09 NOTE — Patient Instructions (Signed)
[place] at [facility] Discharge Instructions  RECOMMENDATIONS MADE BY THE CONSULTANT AND ANY TEST RESULTS WILL BE SENT TO YOUR REFERRING PHYSICIAN.  Today you received 2 units of blood. Return as scheduled for office visit and chemotherapy. Call the clinic with any questions or concerns.   Thank you for choosing [place] at [facility] to provide your oncology and hematology care.  To afford each patient quality time with our provider, please arrive at least 15 minutes before your scheduled appointment time.    You need to re-schedule your appointment should you arrive 10 or more minutes late.  We strive to give you quality time with our providers, and arriving late affects you and other patients whose appointments are after yours.  Also, if you no show three or more times for appointments you may be dismissed from the clinic at the providers discretion.     Again, thank you for choosing [facility].  Our hope is that these requests will decrease the amount of time that you wait before being seen by our physicians.       _____________________________________________________________  Should you have questions after your visit to [facility], please contact our office at [phone] between the hours of 8:30 a.m. and 4:30 p.m.  Voicemails left after 4:30 p.m. will not be returned until the following business day.  For prescription refill requests, have your pharmacy contact our office.   Blood Transfusion Information WHAT IS A BLOOD TRANSFUSION? A transfusion is the replacement of blood or some of its parts. Blood is made up of multiple cells which provide different functions.  Red blood cells carry oxygen and are used for blood loss replacement.  White blood cells fight against infection.  Platelets control bleeding.  Plasma helps clot blood.  Other blood products are available for specialized needs, such as  hemophilia or other clotting disorders. BEFORE THE TRANSFUSION  Who gives blood for transfusions?   You may be able to donate blood to be used at a later date on yourself (autologous donation).  Relatives can be asked to donate blood. This is generally not any safer than if you have received blood from a stranger. The same precautions are taken to ensure safety when a relative's blood is donated.  Healthy volunteers who are fully evaluated to make sure their blood is safe. This is blood bank blood. Transfusion therapy is the safest it has ever been in the practice of medicine. Before blood is taken from a donor, a complete history is taken to make sure that person has no history of diseases nor engages in risky social behavior (examples are intravenous drug use or sexual activity with multiple partners). The donor's travel history is screened to minimize risk of transmitting infections, such as malaria. The donated blood is tested for signs of infectious diseases, such as HIV and hepatitis. The blood is then tested to be sure it is compatible with you in order to minimize the chance of a transfusion reaction. If you or a relative donates blood, this is often done in anticipation of surgery and is not appropriate for emergency situations. It takes many days to process the donated blood. RISKS AND COMPLICATIONS Although transfusion therapy is very safe and saves many lives, the main dangers of transfusion include:   Getting an infectious disease.  Developing a transfusion reaction. This is an allergic reaction to something in the blood you were given. Every precaution  is taken to prevent this. The decision to have a blood transfusion has been considered carefully by your caregiver before blood is given. Blood is not given unless the benefits outweigh the risks. AFTER THE TRANSFUSION  Right after receiving a blood transfusion, you will usually feel much better and more energetic. This is especially  true if your red blood cells have gotten low (anemic). The transfusion raises the level of the red blood cells which carry oxygen, and this usually causes an energy increase.  The nurse administering the transfusion will monitor you carefully for complications. HOME CARE INSTRUCTIONS  No special instructions are needed after a transfusion. You may find your energy is better. Speak with your caregiver about any limitations on activity for underlying diseases you may have. SEEK MEDICAL CARE IF:   Your condition is not improving after your transfusion.  You develop redness or irritation at the intravenous (IV) site. SEEK IMMEDIATE MEDICAL CARE IF:  Any of the following symptoms occur over the next 12 hours:  Shaking chills.  You have a temperature by mouth above 102 F (38.9 C), not controlled by medicine.  Chest, back, or muscle pain.  People around you feel you are not acting correctly or are confused.  Shortness of breath or difficulty breathing.  Dizziness and fainting.  You get a rash or develop hives.  You have a decrease in urine output.  Your urine turns a dark color or changes to pink, red, or brown. Any of the following symptoms occur over the next 10 days:  You have a temperature by mouth above 102 F (38.9 C), not controlled by medicine.  Shortness of breath.  Weakness after normal activity.  The white part of the eye turns yellow (jaundice).  You have a decrease in the amount of urine or are urinating less often.  Your urine turns a dark color or changes to pink, red, or brown. Document Released: 05/15/2000 Document Revised: 08/10/2011 Document Reviewed: 01/02/2008 [facility] Patient Information 2015 [place], [place]. This information is not intended to replace advice given to you by your health care provider. Make sure you discuss any questions you have with your health care provider.

## 2014-08-10 LAB — TYPE AND SCREEN
ABO/RH(D): A POS
ANTIBODY SCREEN: NEGATIVE
UNIT DIVISION: 0
Unit division: 0

## 2014-08-15 ENCOUNTER — Encounter (HOSPITAL_COMMUNITY): Payer: Self-pay | Admitting: Oncology

## 2014-08-15 ENCOUNTER — Encounter (HOSPITAL_BASED_OUTPATIENT_CLINIC_OR_DEPARTMENT_OTHER): Payer: 59 | Admitting: Oncology

## 2014-08-15 VITALS — BP 112/74 | HR 74 | Temp 98.0°F | Resp 18 | Wt 169.2 lb

## 2014-08-15 DIAGNOSIS — C787 Secondary malignant neoplasm of liver and intrahepatic bile duct: Secondary | ICD-10-CM | POA: Diagnosis not present

## 2014-08-15 DIAGNOSIS — C187 Malignant neoplasm of sigmoid colon: Secondary | ICD-10-CM

## 2014-08-15 DIAGNOSIS — C7801 Secondary malignant neoplasm of right lung: Secondary | ICD-10-CM | POA: Diagnosis not present

## 2014-08-15 DIAGNOSIS — K922 Gastrointestinal hemorrhage, unspecified: Secondary | ICD-10-CM

## 2014-08-15 DIAGNOSIS — C7802 Secondary malignant neoplasm of left lung: Secondary | ICD-10-CM | POA: Diagnosis not present

## 2014-08-15 DIAGNOSIS — C189 Malignant neoplasm of colon, unspecified: Secondary | ICD-10-CM

## 2014-08-15 NOTE — Assessment & Plan Note (Signed)
S/P 2 unit PRBC on 3/7.  Will perform iron studies in 4-6 weeks.

## 2014-08-15 NOTE — Assessment & Plan Note (Signed)
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  Now on systemic chemotherapy with FOLFOX beginning on 08/06/2014.  Avastin will be started with cycle 2 of therapy due to recent port insertion.  Pre-chemo labs as planned.  Return in next week for follow-up and for cycle 2 of therapy.

## 2014-08-15 NOTE — Progress Notes (Signed)
No primary care provider on file. No primary provider on file.  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  Avastin will be added with next cycle of chemotherapy.  INTERVAL HISTORY: Tom [name] 49 y.o. 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 Appearance is most likely to represent diffuse hepatic metastasis   07/30/2014 Initial Diagnosis Colon, biopsy, sigmoid - ADENOCARCINOMA.   07/31/2014 Tumor Marker CEA- 48.0   07/31/2014 Imaging Ct abd/pelvis- concerning for primary colorectal neoplasm in the mid to distal sigmoid colon, with lymphadenopathy in the sigmoid mesocolon, ileocolic mesentery, and retroperitoneum, as well as widespread metastatic disease to the liver   08/01/2014 Imaging CT chest- Pathologic thoracic, right hilar, and infrahilar adenopathy associated with a masslike region of possible consolidation in the right middle lobe, surrounding nodularity, and some scattered bilateral pulmonary nodules.   08/06/2014 -  Chemotherapy FOLFOX    I personally reviewed and went over laboratory results with the patient.  The results are noted within this dictation.  He has tolerated cycle 1 well thus far.  He notes some rectal pressure at time that feels like he needs to move his bowels.  However, he is unable to.  With this being said, he is not constipated as he reports normal and regular bowel movements.  I suspect this rectal pressure he feels is secondary to rectal thickening  from his cancer.  He also notes a small amount of blood in stools which is likely malignancy-induced.  We will need to keep an eye on his iron levels.   I provided him education regarding colon cancer.  I provided education regarding Avastin.  Oncologically, he otherwise denies any complaints and ROS questioning is negative.  Past Medical History  Diagnosis Date  . 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     has No Known Allergies.  Tom [name] does not currently have medications on file.  Past Surgical History  Procedure Laterality Date  . Esophagogastroduodenoscopy N/A 07/30/2014    Procedure: ESOPHAGOGASTRODUODENOSCOPY (EGD);  Surgeon: Inda Castle, MD;  Location: [facility];  Service: Endoscopy;  Laterality: N/A;  . Colonoscopy N/A 07/30/2014    Procedure: COLONOSCOPY;  Surgeon: Inda Castle, MD;  Location: [facility];  Service: Endoscopy;  Laterality: N/A;  . Portacath placement Right 08/01/14  . Portacath placement N/A 08/01/2014    Procedure: INSERTION RIGHT IJ PORT-A-CATH WITH ULTRASOUND AND FLUORO;  Surgeon: Gayland Curry, MD;  Location: [facility];  Service: General;  Laterality: N/A;  . Operative ultrasound N/A 08/01/2014    Procedure: OPERATIVE ULTRASOUND;  Surgeon: Gayland Curry, MD;  Location: [facility];  Service: General;  Laterality: N/A;    Denies any headaches, dizziness, double vision, fevers, chills, night sweats, nausea, vomiting, diarrhea, constipation, chest pain, heart palpitations, shortness of breath, blood in stool, black tarry stool, urinary pain, urinary burning, urinary frequency, hematuria.   PHYSICAL EXAMINATION  ECOG PERFORMANCE STATUS:  1 - Symptomatic but completely ambulatory  Filed Vitals:   08/15/14 1021  BP: 112/74  Pulse: 74  Temp: 98 F (36.7 C)  Resp:  18    GENERAL:alert, no distress, well nourished, well developed, comfortable, cooperative and smiling, accompanied by his wife. SKIN: skin color, texture, turgor are normal, no rashes or significant lesions HEAD: Normocephalic, No masses, lesions, tenderness or abnormalities EYES: normal, PERRLA, EOMI, Conjunctiva are pink and non-injected EARS: External ears normal OROPHARYNX:lips, buccal mucosa, and tongue normal and mucous membranes are moist  NECK: supple, trachea midline LYMPH:  not examined BREAST:not examined LUNGS: not examined HEART: not examined ABDOMEN:abdomen soft and normal bowel sounds BACK: Back symmetric, no curvature. EXTREMITIES:less then 2 second capillary refill, no joint deformities, effusion, or inflammation, no cyanosis  NEURO: alert & oriented x 3 with fluent speech, no focal motor/sensory deficits, gait normal   LABORATORY DATA: CBC    Component Value Date/Time   WBC 9.3 08/06/2014 0915   RBC 3.69* 08/06/2014 0915   RBC 3.42* 07/28/2014 1440   HGB 8.3* 08/06/2014 0915   HCT 27.0* 08/06/2014 0915   PLT 555* 08/06/2014 0915   MCV 73.2* 08/06/2014 0915   MCH 22.5* 08/06/2014 0915   MCHC 30.7 08/06/2014 0915   RDW 17.6* 08/06/2014 0915   LYMPHSABS 1.2 08/06/2014 0915   MONOABS 1.0 08/06/2014 0915   EOSABS 0.2 08/06/2014 0915   BASOSABS 0.0 08/06/2014 0915      Chemistry      Component Value Date/Time   NA 137 08/06/2014 0915   K 3.9 08/06/2014 0915   CL 105 08/06/2014 0915   CO2 24 08/06/2014 0915   BUN 19 08/06/2014 0915   CREATININE 1.00 08/06/2014 0915      Component Value Date/Time   CALCIUM 8.5 08/06/2014 0915   ALKPHOS 192* 08/06/2014 0915   AST 47* 08/06/2014 0915   ALT 43 08/06/2014 0915   BILITOT 0.2* 08/06/2014 0915     Lab Results  Component Value Date   CEA 35.4* 08/06/2014     RADIOGRAPHIC STUDIES:  Dg Chest 1 View  08/01/2014   CLINICAL DATA:  Port-A-Cath placement.  EXAM: CHEST  1 VIEW  COMPARISON:  Earlier same  day.  FINDINGS: Right IJ Port-A-Cath is present with tip over the approximate region of the cavoatrial junction/ upper right atrium. No definite pneumothorax visualized. Remainder of the exam is unchanged.  IMPRESSION: Right IJ Port-A-Cath with tip in the approximate position of the upper right atrium. No pneumothorax identified.  Findings were with Dr. Redmond Pulling in the OR at 11:42 a.m. by Dr. Lorin Picket.   Electronically Signed   By: Marin Olp M.D.   On: 08/01/2014 12:51   Ct Chest W Contrast  08/01/2014   CLINICAL DATA:  Stage IV colorectal cancer, assessment for metastatic disease in the chest.  EXAM: CT CHEST WITH CONTRAST  TECHNIQUE: Multidetector CT imaging of the chest was performed during intravenous contrast administration.  CONTRAST:  50mL OMNIPAQUE IOHEXOL 300 MG/ML  SOLN  COMPARISON:  07/30/2014  FINDINGS: Mediastinum/Nodes: Right lower paratracheal node 1.2 cm on image 23 series 2.  Right hilar nodal conglomeration may actually represent 2 adjacent lymph nodes and measures 2.7 by 2.8 cm, image 27 series 2.  Right infrahilar node, 1.4 cm in short axis on image 33 series 2.  Subcarinal node, 1.7 cm in short axis, image 28 series 2. There is an adjacent node below the right mainstem bronchus which measures 1.4 cm on the same image.  Lungs/Pleura: Right lower lobe nodule 0.8 by 1.0 cm, image 29 series 5.  Right upper lobe nodule, 0.3 cm on image 24 series 5.  Sub solid right upper lobe pulmonary nodule, 0.5 by 0.4 cm, image 18 series 5.  Right middle lobe region of airspace opacity/consolidation with surrounding interstitial accentuation and nodularity, image 33 series 5. Subtending airway thickening noted. Main region of consolidations/mass measures proximally 3.4 by 2.3 by 2.2 cm.  0.6 by 0.4 cm nodule in the left upper lobe, image 28 series 5.  Upper abdomen: As noted on recent abdomen CT there are numerous large enhancing masses in the liver. Contrast medium in a new left kidney upper pole  calyceal diverticulum.  Musculoskeletal: Unremarkable  IMPRESSION: 1. Pathologic thoracic, right hilar, and infrahilar adenopathy associated with a masslike region of possible consolidation in the right middle lobe, surrounding nodularity, and some scattered bilateral pulmonary nodules. Differential diagnostic considerations favor metastatic disease or synchronous lung cancer, with infectious process such as tuberculosis less likely. 2. Enhancing masses in the liver compatible with metastatic colorectal cancer.   Electronically Signed   By: Van Clines M.D.   On: 08/01/2014 08:47   US Abdomen Complete  07/29/2014   CLINICAL DATA:  Abdominal pain.  EXAM: ULTRASOUND ABDOMEN COMPLETE  COMPARISON:  None.  FINDINGS: Gallbladder: No gallstones or wall thickening visualized. No sonographic Murphy sign noted.  Common bile duct: Diameter: 3.9 mm, normal  Liver: Multiple heterogeneous mass lesions are demonstrated throughout the liver, some with central cavitation. 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 Suggest CT for further evaluation.  IVC: No abnormality visualized.  Pancreas: Pancreas is incompletely visualized due to overlying bowel gas. Visualized head and neck appear normal.  Spleen: Size and appearance within normal limits.  Right Kidney: Length: 12.4 cm. Echogenicity within normal limits. No mass or hydronephrosis visualized.  Left Kidney: Length: 13.2 cm. Simple appearing cyst in the upper pole of the left kidney measuring 3.3 cm diameter. No hydronephrosis or solid mass identified.  Abdominal aorta: No aneurysm visualized.  Other findings: None.  IMPRESSION: Multiple heterogeneous mass lesions throughout the liver, some with central cavitation. 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   Electronically Signed   By: Lucienne Capers M.D.   On: 07/29/2014 00:28   Ct Abdomen  Pelvis W Contrast  07/31/2014   CLINICAL DATA:  49 year old male with abdominal pain, pallor and lack of energy. History of blood in stools noted in October 2015. 20 pounds unintentional weight loss over the past year.  EXAM: CT ABDOMEN AND PELVIS WITH CONTRAST  TECHNIQUE: Multidetector CT imaging of the abdomen and pelvis was performed using the standard protocol following bolus administration of intravenous contrast.  CONTRAST:  26mL OMNIPAQUE IOHEXOL 300 MG/ML SOLN, 149mL OMNIPAQUE IOHEXOL 300 MG/ML SOLN  COMPARISON:  No priors.  FINDINGS: Lower chest: Some thickening of the peribronchovascular interstitium throughout the right middle lobe and ill-defined central airspace disease is noted in the right middle lobe, incompletely visualized.  Hepatobiliary: Numerous large hypovascular liver lesions concerning for widespread metastatic disease. The largest of these include an 8.2 x 6.7 x 7.7 cm lesion in segment 4B (image 38 of series 2), and a 7.0 x 8.9 x 7.6 cm lesion centered predominantly in segment 8 (image 18 of series 2). Numerous other smaller lesions are also noted. No intra or extrahepatic biliary ductal dilatation. Gallbladder is largely decompressed, but otherwise unremarkable in appearance.  Liver is enlarged measuring 24.8 cm in craniocaudal span.  Pancreas: No pancreatic mass. No pancreatic or peripancreatic fluid collection.  Spleen: Unremarkable.  Adrenals/Urinary Tract: Bilateral adrenal glands are normal in appearance. 2.3 cm low-attenuation lesion in the lateral interpolar region of the left kidney is compatible with a simple cyst. In the upper pole of the left kidney there is an exophytic 4.0 cm low-attenuation lesion, however, this lesion demonstrates high attenuation material on the delayed phase, compatible with a large calyceal diverticulum. Other smaller sub cm low-attenuation lesions in the left kidney are too small to definitively characterize, but are favored to represent tiny cysts. Right  kidney is normal in appearance. No hydroureteronephrosis. Urinary bladder is normal in appearance.  Stomach/Bowel: In the mid to distal sigmoid colon there is also rather diffuse wall thickening, which is asymmetrically increased along the superior margin of the colon, best appreciated on coronal image 85 of series 602, where this abnormal soft tissue thickening appears to extend up into the mid mesocolon along the course of the inferior mesenteric artery where there is lymphadenopathy. This mass like soft tissue thickening is irregular in shape and therefore difficult to measure, however, this is estimated to measure approximately 4.2 x 3.6 x 3.9 cm (images 60 of series 2 and image 84 of series 602). Normal appearance of the stomach. No pathologic dilatation of small bowel or colon. Diffuse thickening of the rectum.  Vascular/Lymphatic: Mild atherosclerosis throughout the abdominal and pelvic vasculature, without evidence of aneurysm or dissection. Lymphadenopathy is noted in the sigmoid mesocolon along the course of the inferior mesenteric artery where a lymph node measures up to 1 cm in short axis. Multiple borderline enlarged retroperitoneal lymph nodes are also noted. In addition, several other mesenteric lymph nodes are noted, largest of which are in the ileocolic distribution, measuring up to 1.5 cm in short axis (image 52 of series 2).  Reproductive: Prostate gland and seminal vesicles are normal in appearance.  Other: No significant volume of ascites.  No pneumoperitoneum.  Musculoskeletal: There are no aggressive appearing lytic or blastic lesions noted in the visualized portions of the skeleton.  IMPRESSION: 1. Findings, as above, concerning for primary colorectal neoplasm in the mid to distal sigmoid colon, with lymphadenopathy in the sigmoid mesocolon, ileocolic mesentery, and retroperitoneum, as well as widespread metastatic disease to the liver, as detailed above. 2. Central airspace consolidation in  the right middle lobe with septal thickening throughout the right middle lobe. These findings are nonspecific, and incompletely visualized, but raise concern for a centrally obstructing lesion (potentially a centrally obstructing metastasis and). Consider further evaluation of the chest with contrast enhanced chest CT in the near future to better evaluate this finding. 3. Multiple left renal lesions appear compatible with simple cysts and a large calyceal diverticulum, as above. 4. Additional incidental findings, as above.   Electronically Signed   By: Vinnie Langton M.D.   On: 07/31/2014 08:10   Dg Chest Portable 1 View  08/01/2014   CLINICAL DATA:  Port-A-Cath placement.  FLUOROSCOPY TIME:  See operative note.  PROCEDURE: AP chest:  Single AP view of the chest demonstrates Center venous catheter tip at the cavoatrial junction. The entire catheter is not visualized. RIGHT hilar adenopathy is present. Scarring is present LEFT base.  IMPRESSION: Port-A-Cath tip cavoatrial junction.  No pneumothorax.   Electronically Signed   By: Rolla Flatten M.D.   On: 08/01/2014 12:59   Dg C-arm 1-60 Min-no Report  08/01/2014   CLINICAL DATA:  Port-A-Cath placement.  FLUOROSCOPY TIME:  See operative note.  PROCEDURE: AP chest:  Single AP view of the chest demonstrates Center venous catheter tip at the cavoatrial junction. The entire catheter is not visualized. RIGHT hilar adenopathy is present. Scarring is present LEFT base.  IMPRESSION: Port-A-Cath tip cavoatrial junction.  No pneumothorax.   Electronically Signed   By: Rolla Flatten M.D.   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  Now on systemic chemotherapy with FOLFOX beginning on 08/06/2014.  Avastin will be started with cycle 2 of therapy due to recent port insertion.  Pre-chemo labs as planned.  Return in next  week for follow-up and for cycle 2 of therapy.   GI bleed S/P 2 unit PRBC on 3/7.  Will perform iron studies in 4-6 weeks.    THERAPY PLAN:  Continue therapy a planned   All questions were answered. The patient knows to call the clinic with any problems, questions or concerns. We can certainly see the patient much sooner if necessary.  Patient and plan discussed with Dr. Ancil Linsey and she is in agreement with the aforementioned.   This note is electronically signed by: Robynn Pane 08/15/2014 11:14 AM

## 2014-08-17 ENCOUNTER — Encounter: Payer: Self-pay | Admitting: Dietician

## 2014-08-17 NOTE — Progress Notes (Signed)
Patient identified to be at risk for malnutrition on the MST secondary to Poor oral intake and weight loss  Contacted Pt by Phone   Wt Readings from Last 10 Encounters:  08/15/14 169 lb 3.2 oz (76.749 kg)  08/06/14 179 lb (81.194 kg)  08/02/14 182 lb 9.6 oz (82.827 kg)  07/28/14 188 lb (85.276 kg)   Patient weight has decreased by 19 pounds in 3 weeks.  Patient reports oral intake as fair-good, but is suffering from a slightly poor appetite.  Patient says that he had been eating about 50% of what he used to, bu recently he thinks he has been doing better (75%).  He has been eating 3 meals, albeit smaller than what he used to eat. He eats cereal/fruit bars/apples for breakfast, chicken tenders/fruit for lunch and sandwiches for dinner.   Mentioned that because his appetite is slightly worse than it used to be it is important for him to eat foods with harger calories/protein ie Peanut butter, eggs, cheese. Also talked about eating smaller snacks, such as boost/ensure, in between his 3 main ones. Also recomended adding condiments/dressings/butter to his foods. Also made sure he is doing whole milk with his cereal; he reports he drinks whole milk a lot.  Pt seemed to be very grateful for the call and thankful for the coupons.   Mailed my contact info, coupons, and handout titled "making the most of each bite"   Burtis Junes RD, LDN Nutrition Pager: [phone] 08/17/2014 1:00 PM

## 2014-08-20 ENCOUNTER — Encounter (HOSPITAL_BASED_OUTPATIENT_CLINIC_OR_DEPARTMENT_OTHER): Payer: 59

## 2014-08-20 ENCOUNTER — Telehealth (HOSPITAL_COMMUNITY): Payer: Self-pay | Admitting: Hematology & Oncology

## 2014-08-20 ENCOUNTER — Encounter (HOSPITAL_COMMUNITY): Payer: Self-pay | Admitting: Hematology & Oncology

## 2014-08-20 ENCOUNTER — Encounter (HOSPITAL_COMMUNITY): Payer: Self-pay

## 2014-08-20 ENCOUNTER — Encounter (HOSPITAL_BASED_OUTPATIENT_CLINIC_OR_DEPARTMENT_OTHER): Payer: 59 | Admitting: Hematology & Oncology

## 2014-08-20 DIAGNOSIS — K922 Gastrointestinal hemorrhage, unspecified: Secondary | ICD-10-CM

## 2014-08-20 DIAGNOSIS — C787 Secondary malignant neoplasm of liver and intrahepatic bile duct: Secondary | ICD-10-CM

## 2014-08-20 DIAGNOSIS — C189 Malignant neoplasm of colon, unspecified: Secondary | ICD-10-CM

## 2014-08-20 DIAGNOSIS — Z5111 Encounter for antineoplastic chemotherapy: Secondary | ICD-10-CM | POA: Diagnosis not present

## 2014-08-20 DIAGNOSIS — C187 Malignant neoplasm of sigmoid colon: Secondary | ICD-10-CM

## 2014-08-20 DIAGNOSIS — Z5112 Encounter for antineoplastic immunotherapy: Secondary | ICD-10-CM | POA: Diagnosis not present

## 2014-08-20 DIAGNOSIS — D509 Iron deficiency anemia, unspecified: Secondary | ICD-10-CM

## 2014-08-20 LAB — COMPREHENSIVE METABOLIC PANEL
ALBUMIN: 2.5 g/dL — AB (ref 3.5–5.2)
ALK PHOS: 185 U/L — AB (ref 39–117)
ALT: 35 U/L (ref 0–53)
AST: 38 U/L — ABNORMAL HIGH (ref 0–37)
Anion gap: 7 (ref 5–15)
BUN: 12 mg/dL (ref 6–23)
CALCIUM: 8.9 mg/dL (ref 8.4–10.5)
CO2: 27 mmol/L (ref 19–32)
Chloride: 103 mmol/L (ref 96–112)
Creatinine, Ser: 0.76 mg/dL (ref 0.50–1.35)
GFR calc non Af Amer: >90 mL/min (ref >90)
Glucose, Bld: 80 mg/dL (ref 70–99)
POTASSIUM: 4 mmol/L (ref 3.5–5.1)
Sodium: 137 mmol/L (ref 135–145)
TOTAL PROTEIN: 6.4 g/dL (ref 6.0–8.3)
Total Bilirubin: 0.1 mg/dL — ABNORMAL LOW (ref 0.3–1.2)

## 2014-08-20 LAB — CBC WITH DIFFERENTIAL/PLATELET
Basophils Absolute: 0.1 10*3/uL (ref 0.0–0.1)
Basophils Relative: 1 % (ref 0–1)
EOS ABS: 0.4 10*3/uL (ref 0.0–0.7)
EOS PCT: 5 % (ref 0–5)
HEMATOCRIT: 35 % — AB (ref 39.0–52.0)
HEMOGLOBIN: 11.2 g/dL — AB (ref 13.0–17.0)
LYMPHS ABS: 1.8 10*3/uL (ref 0.7–4.0)
LYMPHS PCT: 23 % (ref 12–46)
MCH: 23.8 pg — AB (ref 26.0–34.0)
MCHC: 32 g/dL (ref 30.0–36.0)
MCV: 74.3 fL — AB (ref 78.0–100.0)
MONOS PCT: 16 % — AB (ref 3–12)
Monocytes Absolute: 1.2 10*3/uL — ABNORMAL HIGH (ref 0.1–1.0)
Neutro Abs: 4.3 10*3/uL (ref 1.7–7.7)
Neutrophils Relative %: 55 % (ref 43–77)
Platelets: 397 10*3/uL (ref 150–400)
RBC: 4.71 MIL/uL (ref 4.22–5.81)
RDW: 19.2 % — ABNORMAL HIGH (ref 11.5–15.5)
WBC: 7.7 10*3/uL (ref 4.0–10.5)

## 2014-08-20 LAB — URINALYSIS, DIPSTICK ONLY
BILIRUBIN URINE: NEGATIVE
Glucose, UA: NEGATIVE mg/dL
KETONES UR: NEGATIVE mg/dL
Leukocytes, UA: NEGATIVE
Nitrite: NEGATIVE
PROTEIN: 100 mg/dL — AB
Specific Gravity, Urine: 1.02 (ref 1.005–1.030)
UROBILINOGEN UA: 0.2 mg/dL (ref 0.0–1.0)
pH: 6 (ref 5.0–8.0)

## 2014-08-20 MED ORDER — LEUCOVORIN CALCIUM INJECTION 350 MG
400.0000 mg/m2 | Freq: Once | INTRAVENOUS | Status: AC
  Administered 2014-08-20: 792 mg via INTRAVENOUS
  Filled 2014-08-20: qty 39.6

## 2014-08-20 MED ORDER — SODIUM CHLORIDE 0.9 % IJ SOLN
10.0000 mL | INTRAMUSCULAR | Status: DC | PRN
  Administered 2014-08-20: 10 mL
  Filled 2014-08-20: qty 10

## 2014-08-20 MED ORDER — SODIUM CHLORIDE 0.9 % IV SOLN
2400.0000 mg/m2 | INTRAVENOUS | Status: DC
  Administered 2014-08-20: 4750 mg via INTRAVENOUS
  Filled 2014-08-20: qty 95

## 2014-08-20 MED ORDER — SODIUM CHLORIDE 0.9 % IJ SOLN: 10.0000 mL | INTRAMUSCULAR | Status: DC | PRN

## 2014-08-20 MED ORDER — OXALIPLATIN CHEMO INJECTION 100 MG/20ML
85.0000 mg/m2 | Freq: Once | INTRAVENOUS | Status: AC
  Administered 2014-08-20: 170 mg via INTRAVENOUS
  Filled 2014-08-20: qty 34

## 2014-08-20 MED ORDER — DEXTROSE 5 % IV SOLN
Freq: Once | INTRAVENOUS | Status: AC
  Administered 2014-08-20: 10:00:00 via INTRAVENOUS

## 2014-08-20 MED ORDER — SODIUM CHLORIDE 0.9 % IV SOLN
Freq: Once | INTRAVENOUS | Status: AC
  Administered 2014-08-20: 14:00:00 via INTRAVENOUS

## 2014-08-20 MED ORDER — SODIUM CHLORIDE 0.9 % IV SOLN
5.0000 mg/kg | Freq: Once | INTRAVENOUS | Status: AC
  Administered 2014-08-20: 425 mg via INTRAVENOUS
  Filled 2014-08-20: qty 16

## 2014-08-20 MED ORDER — SODIUM CHLORIDE 0.9 % IV SOLN
Freq: Once | INTRAVENOUS | Status: AC
  Administered 2014-08-20: 8 mg via INTRAVENOUS
  Filled 2014-08-20: qty 4

## 2014-08-20 MED ORDER — ONDANSETRON HCL 40 MG/20ML IJ SOLN: 8.0000 mg | Freq: Once | INTRAMUSCULAR | Status: DC

## 2014-08-20 MED ORDER — DEXAMETHASONE SODIUM PHOSPHATE 10 MG/ML IJ SOLN: 10.0000 mg | Freq: Once | INTRAMUSCULAR | Status: DC

## 2014-08-20 MED ORDER — FLUOROURACIL CHEMO INJECTION 2.5 GM/50ML
400.0000 mg/m2 | Freq: Once | INTRAVENOUS | Status: AC
  Administered 2014-08-20: 800 mg via INTRAVENOUS
  Filled 2014-08-20: qty 16

## 2014-08-20 NOTE — Progress Notes (Signed)
Tolerated well

## 2014-08-20 NOTE — Patient Instructions (Signed)
[facility] Discharge Instructions for Patients Receiving Chemotherapy  Today you received the following chemotherapy agents: Oxaliplatin, 33fu, Leucovorin, Avastin  To help prevent nausea and vomiting after your treatment, we encourage you to take your nausea medication: Zofran 8mg  tablet. Take 1 tablet every 8 hours as needed for nausea/vomiting. You may also take Compazine 10mg  tablet (1 tablet every 6 hours as needed for nausea/vomiting)  If you develop nausea and vomiting, or diarrhea that is not controlled by your medication, call the clinic.  The clinic phone number is [phone]. Office hours are Monday-Friday 8:30am-5:00pm.  BELOW ARE SYMPTOMS THAT SHOULD BE REPORTED IMMEDIATELY:  *FEVER GREATER THAN 101.0 F  *CHILLS WITH OR WITHOUT FEVER  NAUSEA AND VOMITING THAT IS NOT CONTROLLED WITH YOUR NAUSEA MEDICATION  *UNUSUAL SHORTNESS OF BREATH  *UNUSUAL BRUISING OR BLEEDING  TENDERNESS IN MOUTH AND THROAT WITH OR WITHOUT PRESENCE OF ULCERS  *URINARY PROBLEMS  *BOWEL PROBLEMS  UNUSUAL RASH Items with * indicate a potential emergency and should be followed up as soon as possible. If you have an emergency after office hours please contact your primary care physician or go to the nearest emergency department.  Please call the clinic during office hours if you have any questions or concerns.   You may also contact the Patient Navigator at [phone] should you have any questions or need assistance in obtaining follow up care. _____________________________________________________________________ Have you asked about our STAR program?    STAR stands for Survivorship Training and Rehabilitation, and this is a nationally recognized cancer care program that focuses on survivorship and rehabilitation.  Cancer and cancer treatments may cause problems, such as, pain, making you feel tired and keeping you from doing the things that you need or want to do. Cancer  rehabilitation can help. Our goal is to reduce these troubling effects and help you have the best quality of life possible.  You may receive a survey from a nurse that asks questions about your current state of health.  Based on the survey results, all eligible patients will be referred to the [facility] program for an evaluation so we can better serve you! A frequently asked questions sheet is available upon request.

## 2014-08-20 NOTE — Progress Notes (Signed)
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 He was treated with his first cycle of FOLFOX, he did very well. He has absolutely no complaints today. He has frequent bowel movements but states this was present prior to his chemotherapy. He denies any significant change in his appetite, may be mildly improved. His abdominal pain is better. He is here for cycle 2 of therapy today.    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 Appearance is most likely to represent diffuse hepatic metastasis   07/30/2014 Initial Diagnosis Colon, biopsy, sigmoid - ADENOCARCINOMA.   07/31/2014 Tumor Marker CEA- 48.0   07/31/2014 Imaging Ct abd/pelvis- concerning for primary colorectal neoplasm in the mid to distal sigmoid colon, with lymphadenopathy in the sigmoid mesocolon, ileocolic mesentery, and retroperitoneum, as well as widespread metastatic disease to the liver   08/01/2014 Imaging CT chest- Pathologic thoracic, right hilar, and infrahilar adenopathy associated with a masslike region of possible consolidation in the right middle lobe, surrounding nodularity, and some scattered bilateral pulmonary nodules.   08/06/2014 -  Chemotherapy FOLFOX    I personally reviewed and went over laboratory results with the patient.  The results are noted within this dictation.  He has tolerated cycle 1 well thus far.  He notes some rectal pressure at  time that feels like he needs to move his bowels.  However, he is unable to.  With this being said, he is not constipated as he reports normal and regular bowel movements.  I suspect this rectal pressure he feels is secondary to rectal thickening from his cancer.  He also notes a small amount of blood in stools which is likely malignancy-induced.  We will need to keep an eye on his iron levels.   I provided him education regarding colon cancer.  I provided education regarding Avastin.  Oncologically, he otherwise denies any complaints and ROS questioning is negative.  Past Medical History  Diagnosis Date  . 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     has No Known Allergies.  Tom [name] does not currently have medications on file.  Past Surgical History  Procedure Laterality Date  . Esophagogastroduodenoscopy N/A 07/30/2014    Procedure: ESOPHAGOGASTRODUODENOSCOPY (EGD);  Surgeon: Inda Castle, MD;  Location: [facility];  Service: Endoscopy;  Laterality: N/A;  . Colonoscopy N/A 07/30/2014    Procedure: COLONOSCOPY;  Surgeon: Inda Castle, MD;  Location: [facility];  Service: Endoscopy;  Laterality: N/A;  . Portacath placement Right 08/01/14  . Portacath placement N/A 08/01/2014    Procedure: INSERTION RIGHT IJ PORT-A-CATH WITH ULTRASOUND AND FLUORO;  Surgeon: Gayland Curry, MD;  Location: [facility];  Service: General;  Laterality: N/A;  . Operative ultrasound N/A 08/01/2014    Procedure: OPERATIVE ULTRASOUND;  Surgeon: Gayland Curry,  MD;  Location: [facility];  Service: General;  Laterality: N/A;    Denies any headaches, dizziness, double vision, fevers, chills, night sweats, nausea, vomiting, diarrhea, constipation, chest pain, heart palpitations, shortness of breath, blood in stool, black tarry stool,  urinary pain, urinary burning, urinary frequency, hematuria.   PHYSICAL EXAMINATION  ECOG PERFORMANCE STATUS: 1 - Symptomatic but completely ambulatory  There were no vitals filed for this visit.  GENERAL:alert, no distress, well nourished, well developed, comfortable, cooperative and smiling, accompanied by his wife. SKIN: skin color, texture, turgor are normal, no rashes or significant lesions HEAD: Normocephalic, No masses, lesions, tenderness or abnormalities EYES: normal, PERRLA, EOMI, Conjunctiva are pink and non-injected EARS: External ears normal OROPHARYNX:lips, buccal mucosa, and tongue normal and mucous membranes are moist  NECK: supple, trachea midline LYMPH:  No palpable adenopathy in the neck or supraclavicular regions, no axillary adenopathy BREAST:not examined LUNGS: Air to auscultation bilaterally with no wheezing or rhonchi HEART: S1 and S2 audible, regular, no ectopy ABDOMEN:abdomen soft and normal bowel sounds, palpable liver edge BACK: Back symmetric, no curvature. EXTREMITIES:less then 2 second capillary refill, no joint deformities, effusion, or inflammation, no cyanosis  NEURO: alert & oriented x 3 with fluent speech, no focal motor/sensory deficits, gait normal   LABORATORY DATA: CBC    Component Value Date/Time   WBC 9.3 08/06/2014 0915   RBC 3.69* 08/06/2014 0915   RBC 3.42* 07/28/2014 1440   HGB 8.3* 08/06/2014 0915   HCT 27.0* 08/06/2014 0915   PLT 555* 08/06/2014 0915   MCV 73.2* 08/06/2014 0915   MCH 22.5* 08/06/2014 0915   MCHC 30.7 08/06/2014 0915   RDW 17.6* 08/06/2014 0915   LYMPHSABS 1.2 08/06/2014 0915   MONOABS 1.0 08/06/2014 0915   EOSABS 0.2 08/06/2014 0915   BASOSABS 0.0 08/06/2014 0915      Chemistry      Component Value Date/Time   NA 137 08/06/2014 0915   K 3.9 08/06/2014 0915   CL 105 08/06/2014 0915   CO2 24 08/06/2014 0915   BUN 19 08/06/2014 0915   CREATININE 1.00 08/06/2014 0915      Component Value Date/Time    CALCIUM 8.5 08/06/2014 0915   ALKPHOS 192* 08/06/2014 0915   AST 47* 08/06/2014 0915   ALT 43 08/06/2014 0915   BILITOT 0.2* 08/06/2014 0915     Lab Results  Component Value Date   CEA 35.4* 08/06/2014       ASSESSMENT AND PLAN:  Stage IV colon cancer  49 year old male with stage IV colon cancer currently on FOLFOX, Avastin will be added to treatment today. He has done remarkably well with his first cycle. I reviewed symptoms and side effects of concern including nausea, vomiting, diarrhea, mouth sores or worsening reflux. He needs no refills on medications. We will plan on seeing him back again in 2 weeks prior to cycle 3 with laboratory studies including a CEA.  Anemia  His anemia is improving, he is taking oral iron with good tolerance. Evidence of iron deficiency which of course was most likely secondary to his malignancy. Since she is tolerating oral iron I have advised him to continue. We will continue to follow his counts moving forward.   THERAPY PLAN:  Continue therapy a planned   All questions were answered. The patient knows to call the clinic with any problems, questions or concerns. We can certainly see the patient much sooner if necessary.     This note is electronically signed by: Molli Hazard 08/20/2014  8:03 AM

## 2014-08-21 ENCOUNTER — Other Ambulatory Visit (HOSPITAL_COMMUNITY): Payer: Self-pay | Admitting: *Deleted

## 2014-08-22 ENCOUNTER — Encounter (HOSPITAL_COMMUNITY): Payer: Self-pay

## 2014-08-22 ENCOUNTER — Encounter (HOSPITAL_BASED_OUTPATIENT_CLINIC_OR_DEPARTMENT_OTHER): Payer: 59

## 2014-08-22 VITALS — BP 108/76 | HR 64 | Temp 98.2°F | Resp 18

## 2014-08-22 DIAGNOSIS — Z452 Encounter for adjustment and management of vascular access device: Secondary | ICD-10-CM | POA: Diagnosis not present

## 2014-08-22 DIAGNOSIS — C189 Malignant neoplasm of colon, unspecified: Secondary | ICD-10-CM

## 2014-08-22 DIAGNOSIS — C187 Malignant neoplasm of sigmoid colon: Secondary | ICD-10-CM | POA: Diagnosis not present

## 2014-08-22 DIAGNOSIS — C787 Secondary malignant neoplasm of liver and intrahepatic bile duct: Principal | ICD-10-CM

## 2014-08-22 MED ORDER — HEPARIN SOD (PORK) LOCK FLUSH 100 UNIT/ML IV SOLN
500.0000 [IU] | Freq: Once | INTRAVENOUS | Status: AC | PRN
  Administered 2014-08-22: 500 [IU]

## 2014-08-22 MED ORDER — SODIUM CHLORIDE 0.9 % IJ SOLN
10.0000 mL | INTRAMUSCULAR | Status: DC | PRN
  Administered 2014-08-22: 10 mL
  Filled 2014-08-22: qty 10

## 2014-08-22 NOTE — Patient Instructions (Signed)
[place] at [facility]  Discharge Instructions:  Pump removed and port flushed.  Follow up as scheduled.  Please call the clinic if you have any questions or concerns _______________________________________________________________  Thank you for choosing [place] at [facility] to provide your oncology and hematology care.  To afford each patient quality time with our providers, please arrive at least 15 minutes before your scheduled appointment.  You need to re-schedule your appointment if you arrive 10 or more minutes late.  We strive to give you quality time with our providers, and arriving late affects you and other patients whose appointments are after yours.  Also, if you no show three or more times for appointments you may be dismissed from the clinic.  Again, thank you for choosing [place] at [place] hope is that these requests will allow you access to exceptional care and in a timely manner. _______________________________________________________________  If you have questions after your visit, please contact our office at [phone] between the hours of 8:30 a.m. and 5:00 p.m. Voicemails left after 4:30 p.m. will not be returned until the following business day. _______________________________________________________________  For prescription refill requests, have your pharmacy contact our office. _______________________________________________________________  Recommendations made by the consultant and any test results will be sent to your referring physician. _______________________________________________________________

## 2014-08-22 NOTE — Progress Notes (Signed)
Tom [name] presented for Portacath de-access and flush.  Proper placement of portacath confirmed by CXR.  Portacath located right chest wall accessed with  H 20 needle.  Good blood return present. Portacath flushed with 86ml NS and 500U/27ml Heparin and needle removed intact.  Procedure tolerated well and without incident.  Pump removed

## 2014-09-03 ENCOUNTER — Encounter (HOSPITAL_COMMUNITY): Payer: Self-pay | Admitting: Hematology & Oncology

## 2014-09-03 ENCOUNTER — Encounter (HOSPITAL_COMMUNITY): Payer: 59 | Attending: Hematology & Oncology

## 2014-09-03 ENCOUNTER — Telehealth: Payer: Self-pay | Admitting: Genetic Counselor

## 2014-09-03 ENCOUNTER — Encounter (HOSPITAL_BASED_OUTPATIENT_CLINIC_OR_DEPARTMENT_OTHER): Payer: 59 | Admitting: Hematology & Oncology

## 2014-09-03 ENCOUNTER — Encounter (HOSPITAL_COMMUNITY): Payer: Self-pay | Admitting: Lab

## 2014-09-03 VITALS — BP 124/73 | HR 70 | Temp 97.9°F | Resp 18 | Wt 168.5 lb

## 2014-09-03 DIAGNOSIS — C7801 Secondary malignant neoplasm of right lung: Secondary | ICD-10-CM

## 2014-09-03 DIAGNOSIS — C7802 Secondary malignant neoplasm of left lung: Secondary | ICD-10-CM

## 2014-09-03 DIAGNOSIS — C187 Malignant neoplasm of sigmoid colon: Secondary | ICD-10-CM

## 2014-09-03 DIAGNOSIS — Z5111 Encounter for antineoplastic chemotherapy: Secondary | ICD-10-CM | POA: Diagnosis not present

## 2014-09-03 DIAGNOSIS — D509 Iron deficiency anemia, unspecified: Secondary | ICD-10-CM

## 2014-09-03 DIAGNOSIS — Z5112 Encounter for antineoplastic immunotherapy: Secondary | ICD-10-CM

## 2014-09-03 DIAGNOSIS — C787 Secondary malignant neoplasm of liver and intrahepatic bile duct: Secondary | ICD-10-CM | POA: Insufficient documentation

## 2014-09-03 DIAGNOSIS — C189 Malignant neoplasm of colon, unspecified: Secondary | ICD-10-CM

## 2014-09-03 DIAGNOSIS — D649 Anemia, unspecified: Secondary | ICD-10-CM

## 2014-09-03 DIAGNOSIS — R634 Abnormal weight loss: Secondary | ICD-10-CM

## 2014-09-03 LAB — CBC WITH DIFFERENTIAL/PLATELET
BASOS ABS: 0 10*3/uL (ref 0.0–0.1)
BASOS PCT: 0 % (ref 0–1)
Eosinophils Absolute: 0.2 10*3/uL (ref 0.0–0.7)
Eosinophils Relative: 3 % (ref 0–5)
HCT: 38.6 % — ABNORMAL LOW (ref 39.0–52.0)
Hemoglobin: 12.1 g/dL — ABNORMAL LOW (ref 13.0–17.0)
Lymphocytes Relative: 26 % (ref 12–46)
Lymphs Abs: 1.5 10*3/uL (ref 0.7–4.0)
MCH: 23.5 pg — AB (ref 26.0–34.0)
MCHC: 31.3 g/dL (ref 30.0–36.0)
MCV: 75.1 fL — AB (ref 78.0–100.0)
Monocytes Absolute: 0.6 10*3/uL (ref 0.1–1.0)
Monocytes Relative: 11 % (ref 3–12)
Neutro Abs: 3.4 10*3/uL (ref 1.7–7.7)
Neutrophils Relative %: 60 % (ref 43–77)
Platelets: 283 10*3/uL (ref 150–400)
RBC: 5.14 MIL/uL (ref 4.22–5.81)
RDW: 20.5 % — AB (ref 11.5–15.5)
WBC: 5.7 10*3/uL (ref 4.0–10.5)

## 2014-09-03 LAB — COMPREHENSIVE METABOLIC PANEL
ALK PHOS: 144 U/L — AB (ref 39–117)
ALT: 25 U/L (ref 0–53)
ANION GAP: 6 (ref 5–15)
AST: 33 U/L (ref 0–37)
Albumin: 2.7 g/dL — ABNORMAL LOW (ref 3.5–5.2)
BILIRUBIN TOTAL: 0.5 mg/dL (ref 0.3–1.2)
BUN: 12 mg/dL (ref 6–23)
CO2: 26 mmol/L (ref 19–32)
Calcium: 8.9 mg/dL (ref 8.4–10.5)
Chloride: 106 mmol/L (ref 96–112)
Creatinine, Ser: 0.75 mg/dL (ref 0.50–1.35)
GFR calc non Af Amer: >90 mL/min (ref >90)
GLUCOSE: 107 mg/dL — AB (ref 70–99)
POTASSIUM: 3.9 mmol/L (ref 3.5–5.1)
Sodium: 138 mmol/L (ref 135–145)
Total Protein: 6.7 g/dL (ref 6.0–8.3)

## 2014-09-03 LAB — IRON AND TIBC
Iron: 104 ug/dL (ref 42–165)
SATURATION RATIOS: 32 % (ref 20–55)
TIBC: 323 ug/dL (ref 215–435)
UIBC: 219 ug/dL (ref 125–400)

## 2014-09-03 LAB — URINALYSIS, DIPSTICK ONLY
BILIRUBIN URINE: NEGATIVE
Glucose, UA: NEGATIVE mg/dL
HGB URINE DIPSTICK: NEGATIVE
Ketones, ur: NEGATIVE mg/dL
Leukocytes, UA: NEGATIVE
Nitrite: NEGATIVE
Protein, ur: 30 mg/dL — AB
Specific Gravity, Urine: <1.005 — ABNORMAL LOW (ref 1.005–1.030)
Urobilinogen, UA: 0.2 mg/dL (ref 0.0–1.0)
pH: 5.5 (ref 5.0–8.0)

## 2014-09-03 LAB — FERRITIN: Ferritin: 82 ng/mL (ref 22–322)

## 2014-09-03 MED ORDER — DEXTROSE 5 % IV SOLN
Freq: Once | INTRAVENOUS | Status: AC
  Administered 2014-09-03: 10:00:00 via INTRAVENOUS

## 2014-09-03 MED ORDER — SODIUM CHLORIDE 0.9 % IV SOLN
Freq: Once | INTRAVENOUS | Status: AC
  Administered 2014-09-03: 8 mg via INTRAVENOUS
  Filled 2014-09-03: qty 4

## 2014-09-03 MED ORDER — SODIUM CHLORIDE 0.9 % IJ SOLN: 10.0000 mL | INTRAMUSCULAR | Status: DC | PRN

## 2014-09-03 MED ORDER — SODIUM CHLORIDE 0.9 % IV SOLN: 8.0000 mg | Freq: Once | INTRAVENOUS | Status: DC

## 2014-09-03 MED ORDER — SODIUM CHLORIDE 0.9 % IV SOLN
5.0000 mg/kg | Freq: Once | INTRAVENOUS | Status: AC
  Administered 2014-09-03: 375 mg via INTRAVENOUS
  Filled 2014-09-03: qty 15

## 2014-09-03 MED ORDER — DEXAMETHASONE SODIUM PHOSPHATE 10 MG/ML IJ SOLN: 10.0000 mg | Freq: Once | INTRAMUSCULAR | Status: DC

## 2014-09-03 MED ORDER — LEUCOVORIN CALCIUM INJECTION 350 MG
400.0000 mg/m2 | Freq: Once | INTRAMUSCULAR | Status: AC
  Administered 2014-09-03: 792 mg via INTRAVENOUS
  Filled 2014-09-03: qty 39.6

## 2014-09-03 MED ORDER — SODIUM CHLORIDE 0.9 % IV SOLN
2400.0000 mg/m2 | INTRAVENOUS | Status: DC
  Administered 2014-09-03: 4750 mg via INTRAVENOUS
  Filled 2014-09-03: qty 95

## 2014-09-03 MED ORDER — FLUOROURACIL CHEMO INJECTION 2.5 GM/50ML
400.0000 mg/m2 | Freq: Once | INTRAVENOUS | Status: AC
  Administered 2014-09-03: 800 mg via INTRAVENOUS
  Filled 2014-09-03: qty 16

## 2014-09-03 MED ORDER — OXALIPLATIN CHEMO INJECTION 100 MG/20ML
85.0000 mg/m2 | Freq: Once | INTRAVENOUS | Status: AC
  Administered 2014-09-03: 170 mg via INTRAVENOUS
  Filled 2014-09-03: qty 34

## 2014-09-03 NOTE — Patient Instructions (Signed)
[place] at [facility]  Discharge Instructions:  You saw Dr Whitney Muse today. Follow up with the doctor in 2 weeks. Chemotherapy as scheduled.  Return to have pump removed on Wednesday.   Referral sent for genetics counselor.  They will call to set up an appt with you.   Please call the clinic if you have any questions or concerns _______________________________________________________________  Thank you for choosing [place] at [facility] to provide your oncology and hematology care.  To afford each patient quality time with our providers, please arrive at least 15 minutes before your scheduled appointment.  You need to re-schedule your appointment if you arrive 10 or more minutes late.  We strive to give you quality time with our providers, and arriving late affects you and other patients whose appointments are after yours.  Also, if you no show three or more times for appointments you may be dismissed from the clinic.  Again, thank you for choosing [place] at [place] hope is that these requests will allow you access to exceptional care and in a timely manner. _______________________________________________________________  If you have questions after your visit, please contact our office at [phone] between the hours of 8:30 a.m. and 5:00 p.m. Voicemails left after 4:30 p.m. will not be returned until the following business day. _______________________________________________________________  For prescription refill requests, have your pharmacy contact our office. _______________________________________________________________  Recommendations made by the consultant and any test results will be sent to your referring physician. _______________________________________________________________

## 2014-09-03 NOTE — Telephone Encounter (Signed)
GENETIC APPT-LEFT MESSAGE FOR PATIENT TO RETURN CALL TO SCHEDULE GENETIC APPT

## 2014-09-03 NOTE — Progress Notes (Signed)
..  Tom [name] tolerated chemo well.

## 2014-09-03 NOTE — Progress Notes (Signed)
Referral sent to Genetics at [facility].  Records faxed on 4/4

## 2014-09-03 NOTE — Progress Notes (Signed)
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 He has no major complaints today. He notes he has finally noticed an improvement in his desire to eat with the biggest change over this past week. His weights were reviewed and on March 3 he weighed 182 pounds, today he weighs approximately 169 pounds. He is noticed that his bowel movements have decreased and he feels it easier to pass his stool. His abdominal pain is markedly improved.    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 Appearance is most likely to represent diffuse hepatic metastasis   07/30/2014 Initial Diagnosis Colon, biopsy, sigmoid - ADENOCARCINOMA.   07/31/2014 Tumor Marker CEA- 48.0   07/31/2014 Imaging Ct abd/pelvis- concerning for primary colorectal neoplasm in the mid to distal sigmoid colon, with lymphadenopathy in the sigmoid mesocolon, ileocolic mesentery, and retroperitoneum, as well as widespread metastatic disease to the liver   08/01/2014 Imaging CT chest- Pathologic thoracic, right hilar, and infrahilar adenopathy associated with a masslike region of possible consolidation in the right middle lobe, surrounding nodularity, and some scattered bilateral pulmonary nodules.   08/06/2014 -  Chemotherapy FOLFOX    I personally reviewed and went over laboratory results with the patient.  The results are noted within this dictation.  He has tolerated  cycle 1 well thus far.  He notes some rectal pressure at time that feels like he needs to move his bowels.  However, he is unable to.  With this being said, he is not constipated as he reports normal and regular bowel movements.  I suspect this rectal pressure he feels is secondary to rectal thickening from his cancer.  He also notes a small amount of blood in stools which is likely malignancy-induced.  We will need to keep an eye on his iron levels.   I provided him education regarding colon cancer.  I provided education regarding Avastin.  Oncologically, he otherwise denies any complaints and ROS questioning is negative.  Past Medical History  Diagnosis Date  . 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     has No Known Allergies.  [name] does not currently have medications on file.  Past Surgical History  Procedure Laterality Date  . Esophagogastroduodenoscopy N/A 07/30/2014    Procedure: ESOPHAGOGASTRODUODENOSCOPY (EGD);  Surgeon: Inda Castle, MD;  Location: [facility];  Service: Endoscopy;  Laterality: N/A;  . Colonoscopy N/A 07/30/2014    Procedure: COLONOSCOPY;  Surgeon: Inda Castle, MD;  Location: [facility];  Service: Endoscopy;  Laterality: N/A;  . Portacath placement Right 08/01/14  . Portacath placement N/A 08/01/2014    Procedure: INSERTION RIGHT IJ PORT-A-CATH WITH ULTRASOUND AND FLUORO;  Surgeon: Gayland Curry, MD;  Location: [facility];  Service: General;  Laterality: N/A;  . Operative ultrasound N/A 08/01/2014  Procedure: OPERATIVE ULTRASOUND;  Surgeon: Gayland Curry, MD;  Location: [facility];  Service: General;  Laterality: N/A;    Denies any headaches, dizziness, double vision, fevers, chills, night sweats, nausea, vomiting, diarrhea, constipation, chest pain, heart  palpitations, shortness of breath, blood in stool, black tarry stool, urinary pain, urinary burning, urinary frequency, hematuria.   PHYSICAL EXAMINATION  ECOG PERFORMANCE STATUS: 1 - Symptomatic but completely ambulatory  Filed Vitals:   09/03/14 0800  BP: 124/73  Pulse: 70  Temp: 97.9 F (36.6 C)  Resp: 18    GENERAL:alert, no distress, well nourished, well developed, comfortable, cooperative and smiling, accompanied by his wife. SKIN: skin color, texture, turgor are normal, no rashes or significant lesions HEAD: Normocephalic, No masses, lesions, tenderness or abnormalities EYES: normal, PERRLA, EOMI, Conjunctiva are pink and non-injected EARS: External ears normal OROPHARYNX:lips, buccal mucosa, and tongue normal and mucous membranes are moist  NECK: supple, trachea midline LYMPH:  No palpable adenopathy in the neck or supraclavicular regions, no axillary adenopathy BREAST:not examined LUNGS: Air to auscultation bilaterally with no wheezing or rhonchi HEART: S1 and S2 audible, regular, no ectopy ABDOMEN:abdomen soft and normal bowel sounds BACK: Back symmetric, no curvature. EXTREMITIES:less then 2 second capillary refill, no joint deformities, effusion, or inflammation, no cyanosis  NEURO: alert & oriented x 3 with fluent speech, no focal motor/sensory deficits, gait normal   LABORATORY DATA: CBC    Component Value Date/Time   WBC 5.7 09/03/2014 0857   RBC 5.14 09/03/2014 0857   RBC 3.42* 07/28/2014 1440   HGB 12.1* 09/03/2014 0857   HCT 38.6* 09/03/2014 0857   PLT 283 09/03/2014 0857   MCV 75.1* 09/03/2014 0857   MCH 23.5* 09/03/2014 0857   MCHC 31.3 09/03/2014 0857   RDW 20.5* 09/03/2014 0857   LYMPHSABS 1.5 09/03/2014 0857   MONOABS 0.6 09/03/2014 0857   EOSABS 0.2 09/03/2014 0857   BASOSABS 0.0 09/03/2014 0857      Chemistry      Component Value Date/Time   NA 138 09/03/2014 0857   K 3.9 09/03/2014 0857   CL 106 09/03/2014 0857   CO2 26 09/03/2014  0857   BUN 12 09/03/2014 0857   CREATININE 0.75 09/03/2014 0857      Component Value Date/Time   CALCIUM 8.9 09/03/2014 0857   ALKPHOS 144* 09/03/2014 0857   AST 33 09/03/2014 0857   ALT 25 09/03/2014 0857   BILITOT 0.5 09/03/2014 0857     Lab Results  Component Value Date   CEA 35.4* 08/06/2014       ASSESSMENT AND PLAN:  Stage IV colon cancer  49 year old male with stage IV colon cancer currently on FOLFOX and Avastin. Clinically he is improved. He is tolerating treatment without difficulty. I have again encouraged him to let us know if he has any difficulties prior to his next follow-up in 2 weeks.  Anemia  His anemia is improving, he is taking oral iron with good tolerance. He had evidence of iron deficiency which of course was most likely secondary to his malignancy. Since he is tolerating oral iron I have advised him to continue. We will continue to follow his counts moving forward.  Genetics Counseling  We discussed genetics counseling today. Initially he was hesitant but by the end of our conversation he is agreeable to proceed. We will work on getting him to [place] to me with the Retail buyer.  THERAPY PLAN:  Continue therapy a planned   All questions were answered. The patient  knows to call the clinic with any problems, questions or concerns. We can certainly see the patient much sooner if necessary.   This note is electronically signed by: Molli Hazard MD 09/03/2014 10:49 AM

## 2014-09-04 LAB — CEA: CEA: 30.2 ng/mL — AB (ref 0.0–4.7)

## 2014-09-05 ENCOUNTER — Encounter (HOSPITAL_BASED_OUTPATIENT_CLINIC_OR_DEPARTMENT_OTHER): Payer: 59

## 2014-09-05 DIAGNOSIS — Z452 Encounter for adjustment and management of vascular access device: Secondary | ICD-10-CM | POA: Diagnosis not present

## 2014-09-05 DIAGNOSIS — C187 Malignant neoplasm of sigmoid colon: Secondary | ICD-10-CM | POA: Diagnosis not present

## 2014-09-05 DIAGNOSIS — C787 Secondary malignant neoplasm of liver and intrahepatic bile duct: Principal | ICD-10-CM

## 2014-09-05 DIAGNOSIS — C189 Malignant neoplasm of colon, unspecified: Secondary | ICD-10-CM

## 2014-09-05 MED ORDER — HEPARIN SOD (PORK) LOCK FLUSH 100 UNIT/ML IV SOLN
500.0000 [IU] | Freq: Once | INTRAVENOUS | Status: AC | PRN
  Administered 2014-09-05: 500 [IU]

## 2014-09-05 MED ORDER — SODIUM CHLORIDE 0.9 % IJ SOLN
10.0000 mL | INTRAMUSCULAR | Status: DC | PRN
  Administered 2014-09-05: 10 mL
  Filled 2014-09-05: qty 10

## 2014-09-05 NOTE — Progress Notes (Signed)
..  Tom [name] presented for Portacath deaccess and flush after continuous infusion .    Portacath located rt chest wall  flushed with 61ml NS and 500U/60ml Heparin and needle removed intact.  Procedure tolerated well and without incident.

## 2014-09-17 ENCOUNTER — Encounter (HOSPITAL_BASED_OUTPATIENT_CLINIC_OR_DEPARTMENT_OTHER): Payer: 59

## 2014-09-17 ENCOUNTER — Encounter (HOSPITAL_BASED_OUTPATIENT_CLINIC_OR_DEPARTMENT_OTHER): Payer: 59 | Admitting: Oncology

## 2014-09-17 ENCOUNTER — Encounter (HOSPITAL_COMMUNITY): Payer: Self-pay | Admitting: Oncology

## 2014-09-17 VITALS — BP 117/72 | HR 75 | Temp 97.9°F | Resp 16 | Wt 175.2 lb

## 2014-09-17 VITALS — BP 119/82 | HR 63 | Temp 97.8°F | Resp 18

## 2014-09-17 DIAGNOSIS — C187 Malignant neoplasm of sigmoid colon: Secondary | ICD-10-CM

## 2014-09-17 DIAGNOSIS — C7802 Secondary malignant neoplasm of left lung: Secondary | ICD-10-CM | POA: Diagnosis not present

## 2014-09-17 DIAGNOSIS — Z5112 Encounter for antineoplastic immunotherapy: Secondary | ICD-10-CM | POA: Diagnosis not present

## 2014-09-17 DIAGNOSIS — C189 Malignant neoplasm of colon, unspecified: Secondary | ICD-10-CM

## 2014-09-17 DIAGNOSIS — D509 Iron deficiency anemia, unspecified: Secondary | ICD-10-CM

## 2014-09-17 DIAGNOSIS — C787 Secondary malignant neoplasm of liver and intrahepatic bile duct: Secondary | ICD-10-CM

## 2014-09-17 DIAGNOSIS — Z5111 Encounter for antineoplastic chemotherapy: Secondary | ICD-10-CM | POA: Diagnosis not present

## 2014-09-17 DIAGNOSIS — C7801 Secondary malignant neoplasm of right lung: Secondary | ICD-10-CM

## 2014-09-17 DIAGNOSIS — G4700 Insomnia, unspecified: Secondary | ICD-10-CM

## 2014-09-17 LAB — COMPREHENSIVE METABOLIC PANEL
ALT: 19 U/L (ref 0–53)
AST: 29 U/L (ref 0–37)
Albumin: 2.4 g/dL — ABNORMAL LOW (ref 3.5–5.2)
Alkaline Phosphatase: 119 U/L — ABNORMAL HIGH (ref 39–117)
Anion gap: 7 (ref 5–15)
BILIRUBIN TOTAL: 0.3 mg/dL (ref 0.3–1.2)
BUN: 14 mg/dL (ref 6–23)
CALCIUM: 8.5 mg/dL (ref 8.4–10.5)
CHLORIDE: 106 mmol/L (ref 96–112)
CO2: 25 mmol/L (ref 19–32)
Creatinine, Ser: 0.67 mg/dL (ref 0.50–1.35)
GFR calc non Af Amer: >90 mL/min (ref >90)
Glucose, Bld: 107 mg/dL — ABNORMAL HIGH (ref 70–99)
Potassium: 4 mmol/L (ref 3.5–5.1)
Sodium: 138 mmol/L (ref 135–145)
Total Protein: 6.1 g/dL (ref 6.0–8.3)

## 2014-09-17 LAB — URINALYSIS, DIPSTICK ONLY
Bilirubin Urine: NEGATIVE
GLUCOSE, UA: NEGATIVE mg/dL
KETONES UR: NEGATIVE mg/dL
Leukocytes, UA: NEGATIVE
NITRITE: NEGATIVE
PH: 6.5 (ref 5.0–8.0)
Protein, ur: 100 mg/dL — AB
Specific Gravity, Urine: 1.025 (ref 1.005–1.030)
UROBILINOGEN UA: 0.2 mg/dL (ref 0.0–1.0)

## 2014-09-17 LAB — CBC WITH DIFFERENTIAL/PLATELET
Basophils Absolute: 0 10*3/uL (ref 0.0–0.1)
Basophils Relative: 0 % (ref 0–1)
EOS ABS: 0.1 10*3/uL (ref 0.0–0.7)
Eosinophils Relative: 2 % (ref 0–5)
HCT: 36.9 % — ABNORMAL LOW (ref 39.0–52.0)
HEMOGLOBIN: 11.8 g/dL — AB (ref 13.0–17.0)
LYMPHS ABS: 1.2 10*3/uL (ref 0.7–4.0)
Lymphocytes Relative: 32 % (ref 12–46)
MCH: 24.2 pg — AB (ref 26.0–34.0)
MCHC: 32 g/dL (ref 30.0–36.0)
MCV: 75.6 fL — ABNORMAL LOW (ref 78.0–100.0)
MONOS PCT: 16 % — AB (ref 3–12)
Monocytes Absolute: 0.6 10*3/uL (ref 0.1–1.0)
NEUTROS ABS: 1.9 10*3/uL (ref 1.7–7.7)
Neutrophils Relative %: 50 % (ref 43–77)
Platelets: 197 10*3/uL (ref 150–400)
RBC: 4.88 MIL/uL (ref 4.22–5.81)
RDW: 22.2 % — ABNORMAL HIGH (ref 11.5–15.5)
WBC: 3.9 10*3/uL — AB (ref 4.0–10.5)

## 2014-09-17 MED ORDER — SODIUM CHLORIDE 0.9 % IV SOLN
5.0000 mg/kg | Freq: Once | INTRAVENOUS | Status: AC
  Administered 2014-09-17: 375 mg via INTRAVENOUS
  Filled 2014-09-17: qty 15

## 2014-09-17 MED ORDER — SODIUM CHLORIDE 0.9 % IJ SOLN
10.0000 mL | INTRAMUSCULAR | Status: DC | PRN
Start: 2014-09-17 — End: 2014-09-17

## 2014-09-17 MED ORDER — LEUCOVORIN CALCIUM INJECTION 350 MG
400.0000 mg/m2 | Freq: Once | INTRAVENOUS | Status: AC
  Administered 2014-09-17: 792 mg via INTRAVENOUS
  Filled 2014-09-17: qty 39.6

## 2014-09-17 MED ORDER — OXALIPLATIN CHEMO INJECTION 100 MG/20ML
85.0000 mg/m2 | Freq: Once | INTRAVENOUS | Status: AC
  Administered 2014-09-17: 170 mg via INTRAVENOUS
  Filled 2014-09-17: qty 34

## 2014-09-17 MED ORDER — SODIUM CHLORIDE 0.9 % IV SOLN
2400.0000 mg/m2 | INTRAVENOUS | Status: DC
  Administered 2014-09-17: 4750 mg via INTRAVENOUS
  Filled 2014-09-17: qty 95

## 2014-09-17 MED ORDER — ONDANSETRON HCL 40 MG/20ML IJ SOLN
Freq: Once | INTRAMUSCULAR | Status: AC
  Administered 2014-09-17: 8 mg via INTRAVENOUS
  Filled 2014-09-17: qty 4

## 2014-09-17 MED ORDER — FLUOROURACIL CHEMO INJECTION 2.5 GM/50ML
400.0000 mg/m2 | Freq: Once | INTRAVENOUS | Status: AC
  Administered 2014-09-17: 800 mg via INTRAVENOUS
  Filled 2014-09-17: qty 16

## 2014-09-17 NOTE — Assessment & Plan Note (Addendum)
49 year old male with stage IV colon cancer currently on FOLFOX + Avastin. Clinically he is improved. He is tolerating treatment without difficulty. I have again encouraged him to let us know if he has any difficulties prior to his next follow-up in 2 weeks.  Pre-chemo labs as planned.  Labs today meet treatment parameters.   I recommended OTC Benadryl for insomnia if needed.  He will let us know if this intervention is ineffective for him.  Return in 2 weeks for follow-up, pre-chemo labs, and chemotherapy as planned.  We can restage him following cycle 6.

## 2014-09-17 NOTE — Progress Notes (Signed)
No primary care provider on file. No primary provider on file.  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 Appearance is most likely to represent diffuse hepatic metastasis   07/30/2014 Initial Diagnosis Colon, biopsy, sigmoid - ADENOCARCINOMA.   07/31/2014 Tumor Marker CEA- 48.0   07/31/2014 Imaging Ct abd/pelvis- concerning for primary colorectal neoplasm in the mid to distal sigmoid colon, with lymphadenopathy in the sigmoid mesocolon, ileocolic mesentery, and retroperitoneum, as well as widespread metastatic disease to the liver   08/01/2014 Imaging CT chest- Pathologic thoracic, right hilar, and infrahilar adenopathy associated with a masslike region of possible consolidation in the right middle lobe, surrounding nodularity, and some scattered bilateral pulmonary nodules.   08/06/2014 -  Chemotherapy FOLFOX.  Avastin added for cycle 2.    I personally reviewed and went over laboratory results with the patient.  The results are noted within this dictation.  He is tolerating therapy well.  He notes some instances of insomnia.  He has not tried anything for this.  He notes that he remembers his episodes of insomnia occurring after doing something on the computer late at night.  He is educated that this did not likely help the situation and may have been one of the major contributing factors to his insomnia.  I provided education regarding this.  He does have Benadryl at home and I  recommended this for sleep when needed.  He will let us know if this is ineffective for him.   He wonders about restaging scans and he is informed that we usually do them after 6 treatments of his chemotherapy.  Today is cycle 4.  Oncologically, he denies any other complaints and ROS questioning is negative.  Past Medical History  Diagnosis Date  . 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     has No Known Allergies.  Mr. Rideaux does not currently have medications on file.  Past Surgical History  Procedure Laterality Date  . Esophagogastroduodenoscopy N/A 07/30/2014    Procedure: ESOPHAGOGASTRODUODENOSCOPY (EGD);  Surgeon: Inda Castle, MD;  Location: [facility];  Service: Endoscopy;  Laterality: N/A;  . Colonoscopy N/A 07/30/2014    Procedure: COLONOSCOPY;  Surgeon: Inda Castle, MD;  Location: [facility];  Service: Endoscopy;  Laterality: N/A;  . Portacath placement Right 08/01/14  . Portacath placement N/A 08/01/2014    Procedure: INSERTION RIGHT IJ PORT-A-CATH WITH ULTRASOUND AND FLUORO;  Surgeon: Gayland Curry, MD;  Location: [facility];  Service: General;  Laterality: N/A;  . Operative ultrasound N/A 08/01/2014    Procedure: OPERATIVE ULTRASOUND;  Surgeon: Gayland Curry, MD;  Location: [facility];  Service: General;  Laterality: N/A;    Denies any headaches, dizziness, double vision, fevers, chills, night sweats, nausea, vomiting, diarrhea, constipation, chest pain, heart palpitations, shortness of breath, blood in stool, black tarry stool, urinary pain, urinary burning, urinary frequency, hematuria.  PHYSICAL EXAMINATION  ECOG PERFORMANCE STATUS: 0 - Asymptomatic  Filed Vitals:   09/17/14 0936  BP: 117/72  Pulse: 75  Temp: 97.9 F (36.6 C)  Resp: 16    GENERAL:alert, no distress,  well nourished, well developed, comfortable, cooperative, smiling and accompanied by his wife. SKIN: skin color, texture, turgor are normal, no rashes or significant lesions HEAD: Normocephalic, No masses, lesions, tenderness or abnormalities EYES: normal, PERRLA, EOMI, Conjunctiva are pink and non-injected EARS: External ears normal OROPHARYNX:lips, buccal mucosa, and tongue normal and mucous membranes are moist  NECK: supple, no adenopathy, thyroid normal size, non-tender, without nodularity, no stridor, non-tender, trachea midline LYMPH:  no palpable lymphadenopathy BREAST:not examined LUNGS: clear to auscultation and percussion HEART: regular rate & rhythm, no murmurs, no gallops, S1 normal and S2 normal ABDOMEN:abdomen soft, non-tender and normal bowel sounds BACK: Back symmetric, no curvature., No CVA tenderness EXTREMITIES:less then 2 second capillary refill, no joint deformities, effusion, or inflammation, no edema, no skin discoloration, no clubbing, no cyanosis  NEURO: alert & oriented x 3 with fluent speech, no focal motor/sensory deficits, gait normal   LABORATORY DATA: CBC    Component Value Date/Time   WBC 3.9* 09/17/2014 0945   RBC 4.88 09/17/2014 0945   RBC 3.42* 07/28/2014 1440   HGB 11.8* 09/17/2014 0945   HCT 36.9* 09/17/2014 0945   PLT 197 09/17/2014 0945   MCV 75.6* 09/17/2014 0945   MCH 24.2* 09/17/2014 0945   MCHC 32.0 09/17/2014 0945   RDW 22.2* 09/17/2014 0945   LYMPHSABS 1.2 09/17/2014 0945   MONOABS 0.6 09/17/2014 0945   EOSABS 0.1 09/17/2014 0945   BASOSABS 0.0 09/17/2014 0945      Chemistry      Component Value Date/Time   NA 138 09/17/2014 0945   K 4.0 09/17/2014 0945   CL 106 09/17/2014 0945   CO2 25 09/17/2014 0945   BUN 14 09/17/2014 0945   CREATININE 0.67 09/17/2014 0945      Component Value Date/Time   CALCIUM 8.5 09/17/2014 0945   ALKPHOS 119* 09/17/2014 0945   AST 29 09/17/2014 0945   ALT 19 09/17/2014 0945   BILITOT 0.3  09/17/2014 0945      Lab Results  Component Value Date   CEA 30.2* 09/03/2014       ASSESSMENT AND PLAN:  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 Clinically he is improved. He is tolerating treatment without difficulty. I have again encouraged him to let us know if he has any difficulties prior to his next follow-up in 2 weeks.  Pre-chemo labs as planned.  Labs today meet treatment parameters.   I recommended OTC Benadryl for insomnia if needed.  He will let us know if this intervention is ineffective for him.  Return in 2 weeks for follow-up, pre-chemo labs, and chemotherapy as planned.  We can restage him following cycle 6.   Microcytic anemia His anemia is improving, he is taking oral iron with good tolerance. He had evidence of iron deficiency which of course was most likely secondary to his malignancy. Since he is tolerating oral iron I have advised him to continue. We will continue to follow his counts moving forward.     THERAPY PLAN:  Continue therapy a planned   All questions were answered. The patient knows to call the clinic with any problems, questions or concerns. We can certainly see the patient much sooner if necessary.  Patient and  plan discussed with Dr. Ancil Linsey and she is in agreement with the aforementioned.   This note is electronically signed by: Robynn Pane 09/17/2014 12:29 PM

## 2014-09-17 NOTE — Patient Instructions (Signed)
[facility] Discharge Instructions for Patients Receiving Chemotherapy  Today you received the following chemotherapy agents folfaox plus avastin Please call the clinic if you have any questions or concerns. Return Wednesday toto have your pump discontinued.  To help prevent nausea and vomiting after your treatment, we encourage you to take your nausea medication    If you develop nausea and vomiting that is not controlled by your nausea medication, call the clinic. If it is after clinic hours your family physician or the after hours number for the clinic or go to the Emergency Department.   BELOW ARE SYMPTOMS THAT SHOULD BE REPORTED IMMEDIATELY:  *FEVER GREATER THAN 101.0 F  *CHILLS WITH OR WITHOUT FEVER  NAUSEA AND VOMITING THAT IS NOT CONTROLLED WITH YOUR NAUSEA MEDICATION  *UNUSUAL SHORTNESS OF BREATH  *UNUSUAL BRUISING OR BLEEDING  TENDERNESS IN MOUTH AND THROAT WITH OR WITHOUT PRESENCE OF ULCERS  *URINARY PROBLEMS  *BOWEL PROBLEMS  UNUSUAL RASH Items with * indicate a potential emergency and should be followed up as soon as possible.  One of the nurses will contact you 24 hours after your treatment. Please let the nurse know about any problems that you may have experienced. Feel free to call the clinic you have any questions or concerns. The clinic phone number is [phone].   I have been informed and understand all the instructions given to me. I know to contact the clinic, my physician, or go to the Emergency Department if any problems should occur. I do not have any questions at this time, but understand that I may call the clinic during office hours or the Patient Navigator at [phone] should I have any questions or need assistance in obtaining follow up care.

## 2014-09-17 NOTE — Assessment & Plan Note (Signed)
His anemia is improving, he is taking oral iron with good tolerance. He had evidence of iron deficiency which of course was most likely secondary to his malignancy. Since he is tolerating oral iron I have advised him to continue. We will continue to follow his counts moving forward.

## 2014-09-17 NOTE — Patient Instructions (Signed)
[place] at [facility] Discharge Instructions  RECOMMENDATIONS MADE BY THE CONSULTANT AND ANY TEST RESULTS WILL BE SENT TO YOUR REFERRING PHYSICIAN.  Exam and Discussion with [name], PA-C. For sleep, take Benadryl 1-2 tablets before bedtime. Return in 2 weeks for chemo and office visit with Tom.  Please call with any new questions, symptoms, or concerns.   Thank you for choosing [place] at [facility] to provide your oncology and hematology care.  To afford each patient quality time with our provider, please arrive at least 15 minutes before your scheduled appointment time.    You need to re-schedule your appointment should you arrive 10 or more minutes late.  We strive to give you quality time with our providers, and arriving late affects you and other patients whose appointments are after yours.  Also, if you no show three or more times for appointments you may be dismissed from the clinic at the providers discretion.     Again, thank you for choosing [facility].  Our hope is that these requests will decrease the amount of time that you wait before being seen by our physicians.       _____________________________________________________________  Should you have questions after your visit to [facility], please contact our office at [phone] between the hours of 8:30 a.m. and 4:30 p.m.  Voicemails left after 4:30 p.m. will not be returned until the following business day.  For prescription refill requests, have your pharmacy contact our office.

## 2014-09-17 NOTE — Progress Notes (Signed)
Protein 100 verified with [name] P-AC ok to treat  [place] chemotherapy well today.  Discharged ambulatory with pump

## 2014-09-19 ENCOUNTER — Encounter (HOSPITAL_COMMUNITY): Payer: Self-pay

## 2014-09-19 ENCOUNTER — Encounter (HOSPITAL_BASED_OUTPATIENT_CLINIC_OR_DEPARTMENT_OTHER): Payer: 59

## 2014-09-19 VITALS — BP 100/56 | HR 70 | Temp 98.5°F | Resp 18

## 2014-09-19 DIAGNOSIS — C7801 Secondary malignant neoplasm of right lung: Secondary | ICD-10-CM | POA: Diagnosis not present

## 2014-09-19 DIAGNOSIS — C189 Malignant neoplasm of colon, unspecified: Secondary | ICD-10-CM

## 2014-09-19 DIAGNOSIS — C187 Malignant neoplasm of sigmoid colon: Secondary | ICD-10-CM

## 2014-09-19 DIAGNOSIS — Z452 Encounter for adjustment and management of vascular access device: Secondary | ICD-10-CM | POA: Diagnosis not present

## 2014-09-19 DIAGNOSIS — C787 Secondary malignant neoplasm of liver and intrahepatic bile duct: Secondary | ICD-10-CM

## 2014-09-19 DIAGNOSIS — C7802 Secondary malignant neoplasm of left lung: Secondary | ICD-10-CM | POA: Diagnosis not present

## 2014-09-19 MED ORDER — HEPARIN SOD (PORK) LOCK FLUSH 100 UNIT/ML IV SOLN
500.0000 [IU] | Freq: Once | INTRAVENOUS | Status: AC | PRN
  Administered 2014-09-19: 500 [IU]

## 2014-09-19 MED ORDER — SODIUM CHLORIDE 0.9 % IJ SOLN
10.0000 mL | INTRAMUSCULAR | Status: DC | PRN
  Administered 2014-09-19: 10 mL
  Filled 2014-09-19: qty 10

## 2014-09-19 NOTE — Progress Notes (Signed)
[name] presented for Portacath de-access and flush.  Proper placement of portacath confirmed by CXR.  Portacath located right chest wall accessed with  H 20 needle.  Good blood return present. Portacath flushed with 39ml NS and 500U/32ml Heparin and needle removed intact.  Procedure tolerated well and without incident.  Pump removed

## 2014-09-19 NOTE — Patient Instructions (Signed)
[place] at [facility]  Discharge Instructions:  You had your pump disconnected today.  Return as scheduled Call the clinic if you have any questions or concerns _______________________________________________________________  Thank you for choosing [place] at [facility] to provide your oncology and hematology care.  To afford each patient quality time with our providers, please arrive at least 15 minutes before your scheduled appointment.  You need to re-schedule your appointment if you arrive 10 or more minutes late.  We strive to give you quality time with our providers, and arriving late affects you and other patients whose appointments are after yours.  Also, if you no show three or more times for appointments you may be dismissed from the clinic.  Again, thank you for choosing [place] at [place] hope is that these requests will allow you access to exceptional care and in a timely manner. _______________________________________________________________  If you have questions after your visit, please contact our office at [phone] between the hours of 8:30 a.m. and 5:00 p.m. Voicemails left after 4:30 p.m. will not be returned until the following business day. _______________________________________________________________  For prescription refill requests, have your pharmacy contact our office. _______________________________________________________________  Recommendations made by the consultant and any test results will be sent to your referring physician. _______________________________________________________________

## 2014-10-01 ENCOUNTER — Encounter (HOSPITAL_COMMUNITY): Payer: Self-pay | Admitting: Hematology & Oncology

## 2014-10-01 ENCOUNTER — Encounter (HOSPITAL_BASED_OUTPATIENT_CLINIC_OR_DEPARTMENT_OTHER): Payer: 59 | Admitting: Hematology & Oncology

## 2014-10-01 ENCOUNTER — Encounter (HOSPITAL_COMMUNITY): Payer: 59 | Attending: Hematology & Oncology

## 2014-10-01 VITALS — BP 116/69 | HR 65 | Temp 98.0°F | Resp 18

## 2014-10-01 VITALS — BP 113/70 | HR 74 | Temp 98.6°F | Resp 16 | Wt 174.9 lb

## 2014-10-01 DIAGNOSIS — C187 Malignant neoplasm of sigmoid colon: Secondary | ICD-10-CM

## 2014-10-01 DIAGNOSIS — C189 Malignant neoplasm of colon, unspecified: Secondary | ICD-10-CM | POA: Diagnosis not present

## 2014-10-01 DIAGNOSIS — C787 Secondary malignant neoplasm of liver and intrahepatic bile duct: Secondary | ICD-10-CM

## 2014-10-01 DIAGNOSIS — C7801 Secondary malignant neoplasm of right lung: Secondary | ICD-10-CM | POA: Diagnosis not present

## 2014-10-01 DIAGNOSIS — D500 Iron deficiency anemia secondary to blood loss (chronic): Secondary | ICD-10-CM

## 2014-10-01 DIAGNOSIS — C7802 Secondary malignant neoplasm of left lung: Secondary | ICD-10-CM

## 2014-10-01 DIAGNOSIS — Z5111 Encounter for antineoplastic chemotherapy: Secondary | ICD-10-CM | POA: Diagnosis not present

## 2014-10-01 DIAGNOSIS — D509 Iron deficiency anemia, unspecified: Secondary | ICD-10-CM

## 2014-10-01 DIAGNOSIS — Z5112 Encounter for antineoplastic immunotherapy: Secondary | ICD-10-CM | POA: Diagnosis not present

## 2014-10-01 LAB — CBC WITH DIFFERENTIAL/PLATELET
BASOS ABS: 0 10*3/uL (ref 0.0–0.1)
Basophils Relative: 1 % (ref 0–1)
EOS ABS: 0.1 10*3/uL (ref 0.0–0.7)
Eosinophils Relative: 3 % (ref 0–5)
HEMATOCRIT: 37.8 % — AB (ref 39.0–52.0)
Hemoglobin: 12.4 g/dL — ABNORMAL LOW (ref 13.0–17.0)
LYMPHS ABS: 1.6 10*3/uL (ref 0.7–4.0)
Lymphocytes Relative: 41 % (ref 12–46)
MCH: 25.5 pg — AB (ref 26.0–34.0)
MCHC: 32.8 g/dL (ref 30.0–36.0)
MCV: 77.8 fL — ABNORMAL LOW (ref 78.0–100.0)
MONO ABS: 0.6 10*3/uL (ref 0.1–1.0)
MONOS PCT: 16 % — AB (ref 3–12)
Neutro Abs: 1.6 10*3/uL — ABNORMAL LOW (ref 1.7–7.7)
Neutrophils Relative %: 41 % — ABNORMAL LOW (ref 43–77)
Platelets: 163 10*3/uL (ref 150–400)
RBC: 4.86 MIL/uL (ref 4.22–5.81)
RDW: 24.4 % — ABNORMAL HIGH (ref 11.5–15.5)
WBC: 3.9 10*3/uL — ABNORMAL LOW (ref 4.0–10.5)

## 2014-10-01 LAB — URINALYSIS, DIPSTICK ONLY
Bilirubin Urine: NEGATIVE
Glucose, UA: NEGATIVE mg/dL
Hgb urine dipstick: NEGATIVE
Ketones, ur: NEGATIVE mg/dL
LEUKOCYTES UA: NEGATIVE
Nitrite: NEGATIVE
PROTEIN: 100 mg/dL — AB
Specific Gravity, Urine: 1.025 (ref 1.005–1.030)
UROBILINOGEN UA: 0.2 mg/dL (ref 0.0–1.0)
pH: 6 (ref 5.0–8.0)

## 2014-10-01 LAB — COMPREHENSIVE METABOLIC PANEL
ALBUMIN: 2.7 g/dL — AB (ref 3.5–5.0)
ALT: 23 U/L (ref 17–63)
ANION GAP: 6 (ref 5–15)
AST: 32 U/L (ref 15–41)
Alkaline Phosphatase: 118 U/L (ref 38–126)
BUN: 14 mg/dL (ref 6–20)
CO2: 26 mmol/L (ref 22–32)
CREATININE: 0.75 mg/dL (ref 0.61–1.24)
Calcium: 8.9 mg/dL (ref 8.9–10.3)
Chloride: 108 mmol/L (ref 101–111)
Glucose, Bld: 90 mg/dL (ref 70–99)
POTASSIUM: 4.1 mmol/L (ref 3.5–5.1)
SODIUM: 140 mmol/L (ref 135–145)
Total Bilirubin: 0.3 mg/dL (ref 0.3–1.2)
Total Protein: 6.2 g/dL — ABNORMAL LOW (ref 6.5–8.1)

## 2014-10-01 MED ORDER — HEPARIN SOD (PORK) LOCK FLUSH 100 UNIT/ML IV SOLN: 500.0000 [IU] | Freq: Once | INTRAVENOUS | Status: DC | PRN

## 2014-10-01 MED ORDER — SODIUM CHLORIDE 0.9 % IV SOLN
Freq: Once | INTRAVENOUS | Status: AC
  Administered 2014-10-01: 8 mg via INTRAVENOUS
  Filled 2014-10-01: qty 4

## 2014-10-01 MED ORDER — DEXTROSE 5 % IV SOLN
Freq: Once | INTRAVENOUS | Status: AC
  Administered 2014-10-01: 12:00:00 via INTRAVENOUS

## 2014-10-01 MED ORDER — SODIUM CHLORIDE 0.9 % IJ SOLN: 10.0000 mL | INTRAMUSCULAR | Status: DC | PRN

## 2014-10-01 MED ORDER — OXALIPLATIN CHEMO INJECTION 100 MG/20ML
85.0000 mg/m2 | Freq: Once | INTRAVENOUS | Status: AC
  Administered 2014-10-01: 170 mg via INTRAVENOUS
  Filled 2014-10-01: qty 34

## 2014-10-01 MED ORDER — SODIUM CHLORIDE 0.9 % IV SOLN
Freq: Once | INTRAVENOUS | Status: AC
  Administered 2014-10-01: 11:00:00 via INTRAVENOUS

## 2014-10-01 MED ORDER — FLUOROURACIL CHEMO INJECTION 2.5 GM/50ML
400.0000 mg/m2 | Freq: Once | INTRAVENOUS | Status: AC
  Administered 2014-10-01: 800 mg via INTRAVENOUS
  Filled 2014-10-01: qty 16

## 2014-10-01 MED ORDER — DEXTROSE 5 % IV SOLN
400.0000 mg/m2 | Freq: Once | INTRAVENOUS | Status: AC
  Administered 2014-10-01: 792 mg via INTRAVENOUS
  Filled 2014-10-01: qty 39.6

## 2014-10-01 MED ORDER — SODIUM CHLORIDE 0.9 % IV SOLN
5.0000 mg/kg | Freq: Once | INTRAVENOUS | Status: AC
  Administered 2014-10-01: 375 mg via INTRAVENOUS
  Filled 2014-10-01: qty 15

## 2014-10-01 MED ORDER — SODIUM CHLORIDE 0.9 % IV SOLN
2400.0000 mg/m2 | INTRAVENOUS | Status: DC
  Administered 2014-10-01: 4750 mg via INTRAVENOUS
  Filled 2014-10-01: qty 95

## 2014-10-01 NOTE — Patient Instructions (Signed)
[facility] Discharge Instructions for Patients Receiving Chemotherapy  Today you received the following chemotherapy agents:  Oxaliplatin, leucovorin and 5FU  If you develop nausea and vomiting, or diarrhea that is not controlled by your medication, call the clinic.  The clinic phone number is [phone]. Office hours are Monday-Friday 8:30am-5:00pm.  BELOW ARE SYMPTOMS THAT SHOULD BE REPORTED IMMEDIATELY:  *FEVER GREATER THAN 101.0 F  *CHILLS WITH OR WITHOUT FEVER  NAUSEA AND VOMITING THAT IS NOT CONTROLLED WITH YOUR NAUSEA MEDICATION  *UNUSUAL SHORTNESS OF BREATH  *UNUSUAL BRUISING OR BLEEDING  TENDERNESS IN MOUTH AND THROAT WITH OR WITHOUT PRESENCE OF ULCERS  *URINARY PROBLEMS  *BOWEL PROBLEMS  UNUSUAL RASH Items with * indicate a potential emergency and should be followed up as soon as possible. If you have an emergency after office hours please contact your primary care physician or go to the nearest emergency department.  Please call the clinic during office hours if you have any questions or concerns.   You may also contact the Patient Navigator at [phone] should you have any questions or need assistance in obtaining follow up care. _____________________________________________________________________ Have you asked about our STAR program?    STAR stands for Survivorship Training and Rehabilitation, and this is a nationally recognized cancer care program that focuses on survivorship and rehabilitation.  Cancer and cancer treatments may cause problems, such as, pain, making you feel tired and keeping you from doing the things that you need or want to do. Cancer rehabilitation can help. Our goal is to reduce these troubling effects and help you have the best quality of life possible.  You may receive a survey from a nurse that asks questions about your current state of health.  Based on the survey results, all eligible patients will be referred to  the [facility] program for an evaluation so we can better serve you! A frequently asked questions sheet is available upon request.

## 2014-10-01 NOTE — Progress Notes (Signed)
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 He has no major complaints today.   He recently lost his job and signed up for Medicaid. Since extremely stressful for he and his wife. She has also gotten a part-time job.  Cold induced symptoms last 1 week and he doesn't currently have them. Denies numbness and tingling. He is able to button his shirts.  He takes a laxative occasionally to help with bowel movements. He doesn't feel that his bowel symptoms are worsening. Denies any blood in his stool. Reports seen "tiny black specks". His lower back pain has resolved. His abdominal pain is at its baseline.  He reports intermittent nasal discharge.   He eats 3 good meals a day.     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 Appearance is most likely to represent diffuse hepatic metastasis   07/30/2014 Initial Diagnosis Colon, biopsy, sigmoid - ADENOCARCINOMA.   07/31/2014 Tumor Marker CEA- 48.0   07/31/2014 Imaging Ct abd/pelvis- concerning for primary colorectal neoplasm in the mid to distal sigmoid colon, with lymphadenopathy in the sigmoid mesocolon, ileocolic mesentery, and retroperitoneum, as well as widespread metastatic disease to the liver   08/01/2014 Imaging CT chest- Pathologic thoracic, right hilar, and infrahilar adenopathy associated with a masslike region of possible consolidation in the right middle lobe, surrounding nodularity, and some scattered  bilateral pulmonary nodules.   08/06/2014 -  Chemotherapy FOLFOX.  Avastin added for cycle 2.    I personally reviewed and went over laboratory results with the patient.  The results are noted within this dictation.  Past Medical History  Diagnosis Date  . 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     has No Known Allergies.  Mr. Casillas does not currently have medications on file.  Past Surgical History  Procedure Laterality Date  . Esophagogastroduodenoscopy N/A 07/30/2014    Procedure: ESOPHAGOGASTRODUODENOSCOPY (EGD);  Surgeon: Inda Castle, MD;  Location: [facility];  Service: Endoscopy;  Laterality: N/A;  . Colonoscopy N/A 07/30/2014    Procedure: COLONOSCOPY;  Surgeon: Inda Castle, MD;  Location: [facility];  Service: Endoscopy;  Laterality: N/A;  . Portacath placement Right 08/01/14  . Portacath placement N/A 08/01/2014    Procedure: INSERTION RIGHT IJ PORT-A-CATH WITH ULTRASOUND AND FLUORO;  Surgeon: Gayland Curry, MD;  Location: [facility];  Service: General;  Laterality: N/A;  . Operative ultrasound N/A 08/01/2014    Procedure: OPERATIVE ULTRASOUND;  Surgeon: Gayland Curry, MD;  Location: [facility];  Service: General;  Laterality: N/A;    Denies any headaches, dizziness, double vision, fevers, chills, night sweats, nausea, vomiting, diarrhea, chest pain, heart palpitations, shortness of breath, blood in stool, black tarry stool, urinary pain, urinary burning, urinary frequency, hematuria.   PHYSICAL EXAMINATION  ECOG PERFORMANCE STATUS: 1 - Symptomatic but completely ambulatory  There  were no vitals filed for this visit.  GENERAL:alert, no distress, well nourished, well developed, comfortable, cooperative and smiling, accompanied by his wife. SKIN: skin color, texture, turgor are normal,  no rashes or significant lesions HEAD: Normocephalic, No masses, lesions, tenderness or abnormalities EYES: normal, PERRLA, EOMI, Conjunctiva are pink and non-injected EARS: External ears normal OROPHARYNX:lips, buccal mucosa, and tongue normal and mucous membranes are moist  NECK: supple, trachea midline LYMPH:  No palpable adenopathy in the neck or supraclavicular regions, no axillary adenopathy BREAST:not examined LUNGS: Air to auscultation bilaterally with no wheezing or rhonchi HEART: S1 and S2 audible, regular, no ectopy ABDOMEN:abdomen soft and normal bowel sounds. No palpable liver edge. BACK: Back symmetric, no curvature. EXTREMITIES:less then 2 second capillary refill, no joint deformities, effusion, or inflammation, no cyanosis  NEURO: alert & oriented x 3 with fluent speech, no focal motor/sensory deficits, gait normal   LABORATORY DATA: CBC    Component Value Date/Time   WBC 3.9* 09/17/2014 0945   RBC 4.88 09/17/2014 0945   RBC 3.42* 07/28/2014 1440   HGB 11.8* 09/17/2014 0945   HCT 36.9* 09/17/2014 0945   PLT 197 09/17/2014 0945   MCV 75.6* 09/17/2014 0945   MCH 24.2* 09/17/2014 0945   MCHC 32.0 09/17/2014 0945   RDW 22.2* 09/17/2014 0945   LYMPHSABS 1.2 09/17/2014 0945   MONOABS 0.6 09/17/2014 0945   EOSABS 0.1 09/17/2014 0945   BASOSABS 0.0 09/17/2014 0945      Chemistry      Component Value Date/Time   NA 138 09/17/2014 0945   K 4.0 09/17/2014 0945   CL 106 09/17/2014 0945   CO2 25 09/17/2014 0945   BUN 14 09/17/2014 0945   CREATININE 0.67 09/17/2014 0945      Component Value Date/Time   CALCIUM 8.5 09/17/2014 0945   ALKPHOS 119* 09/17/2014 0945   AST 29 09/17/2014 0945   ALT 19 09/17/2014 0945   BILITOT 0.3 09/17/2014 0945     Lab Results  Component Value Date   CEA 30.2* 09/03/2014    Current outpatient prescriptions:  .  b complex vitamins tablet, Take 1 tablet by mouth daily., Disp: , Rfl:  .  docusate sodium (COLACE) 100 MG capsule,  Take 1 capsule (100 mg total) by mouth 2 (two) times daily. (Patient taking differently: Take 100 mg by mouth daily. ), Disp: 60 capsule, Rfl: 0 .  ferrous sulfate 325 (65 FE) MG tablet, Take 1 tablet (325 mg total) by mouth 2 (two) times daily., Disp: 60 tablet, Rfl: 0 .  ibuprofen (ADVIL,MOTRIN) 200 MG tablet, Take 60 mg by mouth every 6 (six) hours as needed for mild pain., Disp: , Rfl:  .  leucovorin in dextrose 5 % 250 mL, Inject into the vein once. Every 14 days. To begin Monday 08/06/14, Disp: , Rfl:  .  lidocaine-prilocaine (EMLA) cream, Apply a quarter size amount to port site 1 hour prior to chemo. Do not rub in. Cover with plastic wrap., Disp: 30 g, Rfl: 3 .  Multiple Vitamin (MULTIVITAMIN WITH MINERALS) TABS tablet, Take 1 tablet by mouth daily., Disp: , Rfl:  .  ondansetron (ZOFRAN) 8 MG tablet, Take 1 tablet (8 mg total) by mouth every 8 (eight) hours as needed for nausea or vomiting., Disp: 30 tablet, Rfl: 3 .  OXALIPLATIN IV, Inject into the vein. Every 14 days. To begin Monday 08/06/14, Disp: , Rfl:  .  oxyCODONE-acetaminophen (PERCOCET/ROXICET) 5-325 MG per tablet, Take 1-2 tablets by mouth every 4 (four)  hours as needed for moderate pain., Disp: 30 tablet, Rfl: 0 .  polyethylene glycol (MIRALAX / GLYCOLAX) packet, Take 17 g by mouth daily as needed for mild constipation., Disp: 14 each, Rfl: 0 .  prochlorperazine (COMPAZINE) 10 MG tablet, Take 1 tablet (10 mg total) by mouth every 6 (six) hours as needed for nausea or vomiting., Disp: 30 tablet, Rfl: 3 .  ranitidine (ZANTAC) 150 MG capsule, Take 150 mg by mouth 2 (two) times daily., Disp: , Rfl:  .  sodium chloride 0.9 % SOLN 150 mL with fluorouracil 5 GM/100ML SOLN 200 mg/m2/day, Inject 200 mg/m2/day into the vein. Every 14 days. To begin Monday 08/06/14. To infuse over 46 hours., Disp: , Rfl:  No current facility-administered medications for this visit.  Facility-Administered Medications Ordered in Other Visits:  .  sodium chloride 0.9  % injection 10 mL, 10 mL, Intravenous, PRN, Patrici Ranks, MD, 10 mL at 08/08/14 1237    ASSESSMENT AND PLAN:  Stage IV colon cancer  49 year old male with stage IV colon cancer currently on FOLFOX and Avastin. Clinically he is improved. He is tolerating treatment without difficulty. I have again encouraged him to let us know if he has any difficulties prior to his next follow-up in 2 weeks. We will plan on reimaging studies at the end of May.  Anemia  His anemia is improving, he is taking oral iron with good tolerance. He had evidence of iron deficiency which of course was most likely secondary to his malignancy. Since he is tolerating oral iron I have advised him to continue. We will continue to follow his counts moving forward.  Genetics Counseling  We discussed genetics counseling today. Initially he was hesitant but by the end of our conversation he is agreeable to proceed. We will work on getting him to [place] to meet with the Retail buyer. This may have to be postponed until his insurance issues are resolved.  Financial difficulty  I will have him see me with them today to see if there is anything we can do in the interim. Hopefully he qualifies for Medicaid. We will continue to try to find resources to help the patient and his wife.  THERAPY PLAN:  Continue therapy a planned   All questions were answered. The patient knows to call the clinic with any problems, questions or concerns. We can certainly see the patient much sooner if necessary.   This note is electronically signed by:  Molli Hazard, MD  This document serves as a record of services personally performed by Ancil Linsey, MD. It was created on her behalf by Pearlie Oyster, a trained medical scribe. The creation of this record is based on the scribe's personal observations and the provider's statements to them. This document has been checked and approved by the attending provider.

## 2014-10-01 NOTE — Patient Instructions (Signed)
[place] at [facility] Discharge Instructions  RECOMMENDATIONS MADE BY THE CONSULTANT AND ANY TEST RESULTS WILL BE SENT TO YOUR REFERRING PHYSICIAN.  Exam and discussion by Dr. Whitney Muse Report fevers, chills, uncontrolled nausea, vomiting, diarrhea or other concerns.  Follow-up in 2 weeks.  Thank you for choosing [place] at [facility] to provide your oncology and hematology care.  To afford each patient quality time with our provider, please arrive at least 15 minutes before your scheduled appointment time.    You need to re-schedule your appointment should you arrive 10 or more minutes late.  We strive to give you quality time with our providers, and arriving late affects you and other patients whose appointments are after yours.  Also, if you no show three or more times for appointments you may be dismissed from the clinic at the providers discretion.     Again, thank you for choosing [facility].  Our hope is that these requests will decrease the amount of time that you wait before being seen by our physicians.       _____________________________________________________________  Should you have questions after your visit to [facility], please contact our office at [phone] between the hours of 8:30 a.m. and 4:30 p.m.  Voicemails left after 4:30 p.m. will not be returned until the following business day.  For prescription refill requests, have your pharmacy contact our office.

## 2014-10-02 LAB — CEA: CEA: 7.7 ng/mL — ABNORMAL HIGH (ref 0.0–4.7)

## 2014-10-03 ENCOUNTER — Encounter (HOSPITAL_COMMUNITY): Payer: Self-pay

## 2014-10-03 ENCOUNTER — Encounter (HOSPITAL_BASED_OUTPATIENT_CLINIC_OR_DEPARTMENT_OTHER): Payer: 59

## 2014-10-03 DIAGNOSIS — Z452 Encounter for adjustment and management of vascular access device: Secondary | ICD-10-CM | POA: Diagnosis not present

## 2014-10-03 DIAGNOSIS — C187 Malignant neoplasm of sigmoid colon: Secondary | ICD-10-CM

## 2014-10-03 MED ORDER — SODIUM CHLORIDE 0.9 % IJ SOLN
10.0000 mL | INTRAMUSCULAR | Status: DC | PRN
  Administered 2014-10-03: 10 mL via INTRAVENOUS
  Filled 2014-10-03: qty 10

## 2014-10-03 MED ORDER — HEPARIN SOD (PORK) LOCK FLUSH 100 UNIT/ML IV SOLN
500.0000 [IU] | Freq: Once | INTRAVENOUS | Status: AC
  Administered 2014-10-03: 500 [IU] via INTRAVENOUS

## 2014-10-03 MED ORDER — HEPARIN SOD (PORK) LOCK FLUSH 100 UNIT/ML IV SOLN
INTRAVENOUS | Status: AC
  Filled 2014-10-03: qty 5

## 2014-10-03 NOTE — Patient Instructions (Signed)
[place] at [facility] Discharge Instructions  RECOMMENDATIONS MADE BY THE CONSULTANT AND ANY TEST RESULTS WILL BE SENT TO YOUR REFERRING PHYSICIAN.  Pump discontinued and port flush today. Return as scheduled for chemotherapy and office visits. Call the clinic with any questions or concerns.   Thank you for choosing [place] at [facility] to provide your oncology and hematology care.  To afford each patient quality time with our provider, please arrive at least 15 minutes before your scheduled appointment time.    You need to re-schedule your appointment should you arrive 10 or more minutes late.  We strive to give you quality time with our providers, and arriving late affects you and other patients whose appointments are after yours.  Also, if you no show three or more times for appointments you may be dismissed from the clinic at the providers discretion.     Again, thank you for choosing [facility].  Our hope is that these requests will decrease the amount of time that you wait before being seen by our physicians.       _____________________________________________________________  Should you have questions after your visit to [facility], please contact our office at [phone] between the hours of 8:30 a.m. and 4:30 p.m.  Voicemails left after 4:30 p.m. will not be returned until the following business day.  For prescription refill requests, have your pharmacy contact our office.

## 2014-10-03 NOTE — Progress Notes (Signed)
Tom [name] presented for pump d/c and portacath flush.  Pump d/c then portacath flushed with 82ml NS and 500U/31ml Heparin and needle removed intact.  Procedure tolerated well and without incident.

## 2014-10-08 ENCOUNTER — Other Ambulatory Visit (HOSPITAL_COMMUNITY): Payer: Self-pay | Admitting: *Deleted

## 2014-10-08 ENCOUNTER — Encounter (HOSPITAL_COMMUNITY): Payer: 59

## 2014-10-08 DIAGNOSIS — C189 Malignant neoplasm of colon, unspecified: Secondary | ICD-10-CM

## 2014-10-08 DIAGNOSIS — C787 Secondary malignant neoplasm of liver and intrahepatic bile duct: Principal | ICD-10-CM

## 2014-10-08 LAB — PROTEIN, URINE, 24 HOUR
Collection Interval-UPROT: 24 hours
Protein, 24H Urine: 2054 mg/d — ABNORMAL HIGH (ref 50–100)
Urine Total Volume-UPROT: 1325 mL

## 2014-10-08 NOTE — Addendum Note (Signed)
Addended by: Elenor Legato on: 10/08/2014 02:32 PM   Modules accepted: Orders

## 2014-10-15 ENCOUNTER — Encounter (HOSPITAL_BASED_OUTPATIENT_CLINIC_OR_DEPARTMENT_OTHER): Payer: 59

## 2014-10-15 ENCOUNTER — Encounter (HOSPITAL_BASED_OUTPATIENT_CLINIC_OR_DEPARTMENT_OTHER): Payer: 59 | Admitting: Oncology

## 2014-10-15 ENCOUNTER — Encounter (HOSPITAL_COMMUNITY): Payer: Self-pay | Admitting: Oncology

## 2014-10-15 VITALS — BP 110/72 | HR 81 | Temp 98.3°F | Resp 16

## 2014-10-15 DIAGNOSIS — D509 Iron deficiency anemia, unspecified: Secondary | ICD-10-CM

## 2014-10-15 DIAGNOSIS — R809 Proteinuria, unspecified: Secondary | ICD-10-CM

## 2014-10-15 DIAGNOSIS — Z5111 Encounter for antineoplastic chemotherapy: Secondary | ICD-10-CM

## 2014-10-15 DIAGNOSIS — C7801 Secondary malignant neoplasm of right lung: Secondary | ICD-10-CM

## 2014-10-15 DIAGNOSIS — C7802 Secondary malignant neoplasm of left lung: Secondary | ICD-10-CM | POA: Diagnosis not present

## 2014-10-15 DIAGNOSIS — C787 Secondary malignant neoplasm of liver and intrahepatic bile duct: Principal | ICD-10-CM

## 2014-10-15 DIAGNOSIS — C189 Malignant neoplasm of colon, unspecified: Secondary | ICD-10-CM

## 2014-10-15 DIAGNOSIS — R309 Painful micturition, unspecified: Secondary | ICD-10-CM

## 2014-10-15 DIAGNOSIS — D500 Iron deficiency anemia secondary to blood loss (chronic): Secondary | ICD-10-CM

## 2014-10-15 DIAGNOSIS — C187 Malignant neoplasm of sigmoid colon: Secondary | ICD-10-CM

## 2014-10-15 LAB — COMPREHENSIVE METABOLIC PANEL
ALT: 22 U/L (ref 17–63)
AST: 35 U/L (ref 15–41)
Albumin: 2.5 g/dL — ABNORMAL LOW (ref 3.5–5.0)
Alkaline Phosphatase: 120 U/L (ref 38–126)
Anion gap: 7 (ref 5–15)
BUN: 11 mg/dL (ref 6–20)
CO2: 26 mmol/L (ref 22–32)
CREATININE: 0.72 mg/dL (ref 0.61–1.24)
Calcium: 8.4 mg/dL — ABNORMAL LOW (ref 8.9–10.3)
Chloride: 104 mmol/L (ref 101–111)
GLUCOSE: 87 mg/dL (ref 65–99)
Potassium: 4 mmol/L (ref 3.5–5.1)
Sodium: 137 mmol/L (ref 135–145)
Total Bilirubin: 0.3 mg/dL (ref 0.3–1.2)
Total Protein: 5.7 g/dL — ABNORMAL LOW (ref 6.5–8.1)

## 2014-10-15 LAB — CBC WITH DIFFERENTIAL/PLATELET
Basophils Absolute: 0 10*3/uL (ref 0.0–0.1)
Basophils Relative: 0 % (ref 0–1)
EOS ABS: 0.1 10*3/uL (ref 0.0–0.7)
Eosinophils Relative: 2 % (ref 0–5)
HCT: 36.4 % — ABNORMAL LOW (ref 39.0–52.0)
HEMOGLOBIN: 12 g/dL — AB (ref 13.0–17.0)
Lymphocytes Relative: 37 % (ref 12–46)
Lymphs Abs: 1.5 10*3/uL (ref 0.7–4.0)
MCH: 26 pg (ref 26.0–34.0)
MCHC: 33 g/dL (ref 30.0–36.0)
MCV: 78.8 fL (ref 78.0–100.0)
MONOS PCT: 15 % — AB (ref 3–12)
Monocytes Absolute: 0.6 10*3/uL (ref 0.1–1.0)
NEUTROS ABS: 1.9 10*3/uL (ref 1.7–7.7)
NEUTROS PCT: 46 % (ref 43–77)
PLATELETS: 177 10*3/uL (ref 150–400)
RBC: 4.62 MIL/uL (ref 4.22–5.81)
RDW: 25.4 % — ABNORMAL HIGH (ref 11.5–15.5)
WBC: 4.2 10*3/uL (ref 4.0–10.5)

## 2014-10-15 LAB — URINALYSIS, ROUTINE W REFLEX MICROSCOPIC
Bilirubin Urine: NEGATIVE
Glucose, UA: NEGATIVE mg/dL
Ketones, ur: NEGATIVE mg/dL
LEUKOCYTES UA: NEGATIVE
Nitrite: NEGATIVE
Protein, ur: 100 mg/dL — AB
Specific Gravity, Urine: 1.02 (ref 1.005–1.030)
UROBILINOGEN UA: 0.2 mg/dL (ref 0.0–1.0)
pH: 6.5 (ref 5.0–8.0)

## 2014-10-15 LAB — IRON AND TIBC
IRON: 63 ug/dL (ref 45–182)
SATURATION RATIOS: 18 % (ref 17.9–39.5)
TIBC: 349 ug/dL (ref 250–450)
UIBC: 286 ug/dL

## 2014-10-15 LAB — URINE MICROSCOPIC-ADD ON

## 2014-10-15 LAB — FERRITIN: FERRITIN: 94 ng/mL (ref 24–336)

## 2014-10-15 MED ORDER — SODIUM CHLORIDE 0.9 % IV SOLN
Freq: Once | INTRAVENOUS | Status: AC
  Administered 2014-10-15: 8 mg via INTRAVENOUS
  Filled 2014-10-15: qty 4

## 2014-10-15 MED ORDER — DEXTROSE 5 % IV SOLN
85.0000 mg/m2 | Freq: Once | INTRAVENOUS | Status: AC
  Administered 2014-10-15: 170 mg via INTRAVENOUS
  Filled 2014-10-15: qty 34

## 2014-10-15 MED ORDER — SODIUM CHLORIDE 0.9 % IJ SOLN
10.0000 mL | INTRAMUSCULAR | Status: DC | PRN
  Administered 2014-10-15: 10 mL
  Filled 2014-10-15: qty 10

## 2014-10-15 MED ORDER — LEUCOVORIN CALCIUM INJECTION 350 MG
400.0000 mg/m2 | Freq: Once | INTRAVENOUS | Status: AC
  Administered 2014-10-15: 792 mg via INTRAVENOUS
  Filled 2014-10-15: qty 39.6

## 2014-10-15 MED ORDER — DEXTROSE 5 % IV SOLN
Freq: Once | INTRAVENOUS | Status: AC
  Administered 2014-10-15: 11:00:00 via INTRAVENOUS

## 2014-10-15 MED ORDER — SODIUM CHLORIDE 0.9 % IV SOLN
2400.0000 mg/m2 | INTRAVENOUS | Status: DC
  Administered 2014-10-15: 4750 mg via INTRAVENOUS
  Filled 2014-10-15: qty 95

## 2014-10-15 MED ORDER — FLUOROURACIL CHEMO INJECTION 2.5 GM/50ML
400.0000 mg/m2 | Freq: Once | INTRAVENOUS | Status: AC
  Administered 2014-10-15: 800 mg via INTRAVENOUS
  Filled 2014-10-15: qty 16

## 2014-10-15 NOTE — Assessment & Plan Note (Signed)
His anemia is improving, he is taking oral iron with good tolerance. He had evidence of iron deficiency which of course was most likely secondary to his malignancy. Since he is tolerating oral iron I have advised him to continue. We will continue to follow his counts moving forward.

## 2014-10-15 NOTE — Assessment & Plan Note (Addendum)
50 year old male with stage IV colon cancer currently on FOLFOX + Avastin. Clinically he is improved. He is tolerating treatment without difficulty. I have again encouraged him to let us know if he has any difficulties prior to his next follow-up in 2 weeks.  Pre-chemo labs as planned.  Labs today meet treatment parameters.   CT CAP with contrast within the next two weeks.  He will be referred to Genetics Counseling in the future.  UA today for complaints of intermittent burning with urination.    Repeat 24 hour urine collection for proteinuria in 1 week.  Return in 2 weeks for follow-up, pre-chemo labs, review of imaging tests, and chemotherapy as planned.    Unfortunately, Tom [name] has lost his job.  He is signed up for Medicaid as a result.  He reports that he is taking Cannabis oil and is considering apricot seeds.  He reports that he has been told that it can help with his cancer treatment by his brother.  I have educated that he can try these two interventions, but there is a lack of scientific evidence that it will help with his malignancy.  I do not see any interaction between his chemotherapy and these two agents.  He reports that he is seeing floaters and his prescription eye glasses have changed.  I have recommended ophthalmology appointment.

## 2014-10-15 NOTE — Progress Notes (Signed)
Do not give avastin today per Dr.Penland due to elevated urine protein. Tolerated chem well. D/C home with continuous infusion pump. Patient left ambulatory with wife.

## 2014-10-15 NOTE — Progress Notes (Signed)
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 Appearance is most likely to represent diffuse hepatic metastasis   07/30/2014 Initial Diagnosis Colon, biopsy, sigmoid - ADENOCARCINOMA.   07/31/2014 Tumor Marker CEA- 48.0   07/31/2014 Imaging Ct abd/pelvis- concerning for primary colorectal neoplasm in the mid to distal sigmoid colon, with lymphadenopathy in the sigmoid mesocolon, ileocolic mesentery, and retroperitoneum, as well as widespread metastatic disease to the liver   08/01/2014 Imaging CT chest- Pathologic thoracic, right hilar, and infrahilar adenopathy associated with a masslike region of possible consolidation in the right middle lobe, surrounding nodularity, and some scattered bilateral pulmonary nodules.   08/06/2014 -  Chemotherapy FOLFOX.  Avastin added for cycle 2.    I personally reviewed and went over laboratory results with the patient.  The results are noted within this dictation.  Tom [name] had plenty of questions today and I have answered them.  I educated him about his proteinuria and why we need to hold Avastin as a result with > 2 g of protein in urine over 24 hour period.  He gave me detailed information about his bowel habits.   He is taking a stool softener as prescribed.  We discussed a bowel regimen to avoid constipation and he has a good grasp on this issue.  He reports that his vision is changing and he notes that he has to raise his eye glasses to see close.  Additionally, he notes "floaters" in his eyes.  He has not seen an ophthalmologist in some time and I have recommended he get an appointment with one to address these issues as they sound more like a primary issue rather than a chemotherapy-induced issue.  He notes that is taking cannabis oil and is considering apricot seeds as he has been told by a family member that they assist with cancer treatment.  I do not see any interactions with these and his chemotherapy treatment.  Oncologically, he denies any other complaints and ROS questioning is negative.  Past Medical History  Diagnosis Date  . 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     has No Known Allergies.  Tom [name] does not currently have medications on file.  Past Surgical History  Procedure Laterality Date  . Esophagogastroduodenoscopy N/A 07/30/2014    Procedure: ESOPHAGOGASTRODUODENOSCOPY (EGD);  Surgeon: Inda Castle, MD;  Location: [facility];  Service: Endoscopy;  Laterality: N/A;  . Colonoscopy N/A 07/30/2014    Procedure: COLONOSCOPY;  Surgeon: Inda Castle, MD;  Location: [facility];  Service: Endoscopy;  Laterality: N/A;  . Portacath placement Right 08/01/14  . Portacath placement N/A 08/01/2014    Procedure:  INSERTION RIGHT IJ PORT-A-CATH WITH ULTRASOUND AND FLUORO;  Surgeon: Gayland Curry, MD;  Location: [facility];  Service: General;  Laterality: N/A;  . Operative ultrasound N/A 08/01/2014    Procedure: OPERATIVE ULTRASOUND;  Surgeon: Gayland Curry, MD;  Location: [facility];  Service: General;   Laterality: N/A;    Denies any headaches, dizziness, double vision, fevers, chills, night sweats, nausea, vomiting, diarrhea, constipation, chest pain, heart palpitations, shortness of breath, blood in stool, black tarry stool, urinary pain, urinary burning, urinary frequency, hematuria.   PHYSICAL EXAMINATION  ECOG PERFORMANCE STATUS: 0 - Asymptomatic  There were no vitals filed for this visit.  GENERAL:alert, no distress, well nourished, well developed, comfortable, cooperative, smiling and accompanied by his wife. SKIN: skin color, texture, turgor are normal, no rashes or significant lesions HEAD: Normocephalic, No masses, lesions, tenderness or abnormalities EYES: normal, PERRLA, EOMI, Conjunctiva are pink and non-injected EARS: External ears normal OROPHARYNX:lips, buccal mucosa, and tongue normal and mucous membranes are moist  NECK: supple, no adenopathy, thyroid normal size, non-tender, without nodularity, no stridor, non-tender, trachea midline LYMPH:  no palpable lymphadenopathy BREAST:not examined LUNGS: clear to auscultation and percussion HEART: regular rate & rhythm, no murmurs, no gallops, S1 normal and S2 normal ABDOMEN:abdomen soft, non-tender and normal bowel sounds BACK: Back symmetric, no curvature., No CVA tenderness EXTREMITIES:less then 2 second capillary refill, no joint deformities, effusion, or inflammation, no edema, no skin discoloration, no clubbing, no cyanosis  NEURO: alert & oriented x 3 with fluent speech, no focal motor/sensory deficits, gait normal   LABORATORY DATA: CBC    Component Value Date/Time   WBC 4.2 10/15/2014 0940   RBC 4.62 10/15/2014 0940   RBC 3.42* 07/28/2014 1440   HGB 12.0* 10/15/2014 0940   HCT 36.4* 10/15/2014 0940   PLT 177 10/15/2014 0940   MCV 78.8 10/15/2014 0940   MCH 26.0 10/15/2014 0940   MCHC 33.0 10/15/2014 0940   RDW 25.4* 10/15/2014 0940   LYMPHSABS 1.5 10/15/2014 0940   MONOABS 0.6 10/15/2014 0940   EOSABS  0.1 10/15/2014 0940   BASOSABS 0.0 10/15/2014 0940      Chemistry      Component Value Date/Time   NA 137 10/15/2014 0940   K 4.0 10/15/2014 0940   CL 104 10/15/2014 0940   CO2 26 10/15/2014 0940   BUN 11 10/15/2014 0940   CREATININE 0.72 10/15/2014 0940      Component Value Date/Time   CALCIUM 8.4* 10/15/2014 0940   ALKPHOS 120 10/15/2014 0940   AST 35 10/15/2014 0940   ALT 22 10/15/2014 0940   BILITOT 0.3 10/15/2014 0940      Lab Results  Component Value Date   CEA 7.7* 10/01/2014       ASSESSMENT AND PLAN:  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 Clinically he is improved. He is tolerating treatment without difficulty. I have again encouraged him to let us know if he has any difficulties prior to his next follow-up in 2 weeks.  Pre-chemo labs as planned.  Labs today meet treatment parameters.   CT CAP with contrast within the next two weeks.  He will be referred to Genetics Counseling in the future.  UA today for complaints of intermittent burning with urination.    Return in 2 weeks for follow-up, pre-chemo labs, review of imaging tests, and chemotherapy as planned.    Unfortunately, Tom [name] has lost his job.  He is signed  up for Medicaid as a result.  He reports that he is taking Cannabis oil and is considering apricot seeds.  He reports that he has been told that it can help with his cancer treatment by his brother.  I have educated that he can try these two interventions, but there is a lack of scientific evidence that it will help with his malignancy.  I do not see any interaction between his chemotherapy and these two agents.  He reports that he is seeing floaters and his prescription eye glasses have changed.  I have recommended ophthalmology appointment.   Microcytic anemia His anemia is improving, he is taking oral iron with good tolerance. He had evidence of iron deficiency  which of course was most likely secondary to his malignancy. Since he is tolerating oral iron I have advised him to continue. We will continue to follow his counts moving forward.     THERAPY PLAN:  Continue therapy a planned.  We will restaging with CT scans in the next 2 weeks.  All questions were answered. The patient knows to call the clinic with any problems, questions or concerns. We can certainly see the patient much sooner if necessary.  Patient and plan discussed with Dr. Ancil Linsey and she is in agreement with the aforementioned.   I spent 30 minutes in face-to-face interview with the patient.  This note is electronically signed by: Robynn Pane 10/15/2014 10:38 AM

## 2014-10-15 NOTE — Patient Instructions (Signed)
..  [place] at [facility] Discharge Instructions  RECOMMENDATIONS MADE BY THE CONSULTANT AND ANY TEST RESULTS WILL BE SENT TO YOUR REFERRING PHYSICIAN.  Bowel regimen discussed U/A today  24 hr urine collection next week No avastin today  CT CAP within next 2 weeks  Return in 2 weeks and 4 weeks for follow-up and chemo  Thank you for choosing [place] at [facility] to provide your oncology and hematology care.  To afford each patient quality time with our provider, please arrive at least 15 minutes before your scheduled appointment time.    You need to re-schedule your appointment should you arrive 10 or more minutes late.  We strive to give you quality time with our providers, and arriving late affects you and other patients whose appointments are after yours.  Also, if you no show three or more times for appointments you may be dismissed from the clinic at the providers discretion.     Again, thank you for choosing [facility].  Our hope is that these requests will decrease the amount of time that you wait before being seen by our physicians.       _____________________________________________________________  Should you have questions after your visit to [facility], please contact our office at [phone] between the hours of 8:30 a.m. and 4:30 p.m.  Voicemails left after 4:30 p.m. will not be returned until the following business day.  For prescription refill requests, have your pharmacy contact our office.

## 2014-10-15 NOTE — Patient Instructions (Signed)
[facility] Discharge Instructions for Patients Receiving Chemotherapy  Today you received the following chemotherapy agents Oxaliplain, leucovorin and 5FU pump.  To help prevent nausea and vomiting after your treatment, we encourage you to take your nausea medication as instructed. If you develop nausea and vomiting that is not controlled by your nausea medication, call the clinic. If it is after clinic hours your family physician or the after hours number for the clinic or go to the Emergency Department. BELOW ARE SYMPTOMS THAT SHOULD BE REPORTED IMMEDIATELY:  *FEVER GREATER THAN 101.0 F  *CHILLS WITH OR WITHOUT FEVER  NAUSEA AND VOMITING THAT IS NOT CONTROLLED WITH YOUR NAUSEA MEDICATION  *UNUSUAL SHORTNESS OF BREATH  *UNUSUAL BRUISING OR BLEEDING  TENDERNESS IN MOUTH AND THROAT WITH OR WITHOUT PRESENCE OF ULCERS  *URINARY PROBLEMS  *BOWEL PROBLEMS  UNUSUAL RASH Items with * indicate a potential emergency and should be followed up as soon as possible.  Return between 1230 and 100 on Wed to have pump removed.  I have been informed and understand all the instructions given to me. I know to contact the clinic, my physician, or go to the Emergency Department if any problems should occur. I do not have any questions at this time, but understand that I may call the clinic during office hours or the Patient Navigator at [phone] should I have any questions or need assistance in obtaining follow up care.    __________________________________________  _____________  __________ Signature of Patient or Authorized Representative            Date                   Time    __________________________________________ Nurse's Signature

## 2014-10-17 ENCOUNTER — Encounter (HOSPITAL_BASED_OUTPATIENT_CLINIC_OR_DEPARTMENT_OTHER): Payer: 59

## 2014-10-17 VITALS — BP 103/55 | HR 56 | Temp 98.0°F | Resp 16

## 2014-10-17 DIAGNOSIS — C187 Malignant neoplasm of sigmoid colon: Secondary | ICD-10-CM

## 2014-10-17 DIAGNOSIS — C787 Secondary malignant neoplasm of liver and intrahepatic bile duct: Secondary | ICD-10-CM | POA: Diagnosis not present

## 2014-10-17 DIAGNOSIS — Z452 Encounter for adjustment and management of vascular access device: Secondary | ICD-10-CM

## 2014-10-17 DIAGNOSIS — C189 Malignant neoplasm of colon, unspecified: Secondary | ICD-10-CM

## 2014-10-17 MED ORDER — HEPARIN SOD (PORK) LOCK FLUSH 100 UNIT/ML IV SOLN
500.0000 [IU] | Freq: Once | INTRAVENOUS | Status: AC | PRN
  Administered 2014-10-17: 500 [IU]

## 2014-10-17 MED ORDER — SODIUM CHLORIDE 0.9 % IJ SOLN
10.0000 mL | INTRAMUSCULAR | Status: DC | PRN
  Administered 2014-10-17: 10 mL
  Filled 2014-10-17: qty 10

## 2014-10-17 MED ORDER — HEPARIN SOD (PORK) LOCK FLUSH 100 UNIT/ML IV SOLN
INTRAVENOUS | Status: AC
  Filled 2014-10-17: qty 5

## 2014-10-17 NOTE — Patient Instructions (Signed)
[place] at [facility] Discharge Instructions  RECOMMENDATIONS MADE BY THE CONSULTANT AND ANY TEST RESULTS WILL BE SENT TO YOUR REFERRING PHYSICIAN.  Discontinued continuous infusion pump. Stay hydrated. Report any issues/concerns to clinic as needed. Return as scheduled.  Thank you for choosing [place] at [facility] to provide your oncology and hematology care.  To afford each patient quality time with our provider, please arrive at least 15 minutes before your scheduled appointment time.    You need to re-schedule your appointment should you arrive 10 or more minutes late.  We strive to give you quality time with our providers, and arriving late affects you and other patients whose appointments are after yours.  Also, if you no show three or more times for appointments you may be dismissed from the clinic at the providers discretion.     Again, thank you for choosing [facility].  Our hope is that these requests will decrease the amount of time that you wait before being seen by our physicians.       _____________________________________________________________  Should you have questions after your visit to [facility], please contact our office at [phone] between the hours of 8:30 a.m. and 4:30 p.m.  Voicemails left after 4:30 p.m. will not be returned until the following business day.  For prescription refill requests, have your pharmacy contact our office.

## 2014-10-17 NOTE — Progress Notes (Signed)
D/C continuous infusion pump. Patient reports feeling dizzy some yesterday. Reported to [facility]. Blood pressure and medication reviewed. No Neil Errickson instructions. Encourage patient to stay hydrated and report any issues/concerns to clinic as needed. Flushed port per protocol and de-accessed port needle.

## 2014-10-22 ENCOUNTER — Other Ambulatory Visit (HOSPITAL_COMMUNITY): Payer: 59 | Admitting: *Deleted

## 2014-10-22 ENCOUNTER — Ambulatory Visit (HOSPITAL_COMMUNITY)
Admission: RE | Admit: 2014-10-22 | Discharge: 2014-10-22 | Disposition: A | Discharge status: Home / Self Care | Payer: Medicaid Other | Source: Ambulatory Visit | Attending: Oncology | Admitting: Oncology

## 2014-10-22 DIAGNOSIS — C787 Secondary malignant neoplasm of liver and intrahepatic bile duct: Secondary | ICD-10-CM | POA: Insufficient documentation

## 2014-10-22 DIAGNOSIS — C189 Malignant neoplasm of colon, unspecified: Secondary | ICD-10-CM | POA: Diagnosis present

## 2014-10-22 DIAGNOSIS — R809 Proteinuria, unspecified: Secondary | ICD-10-CM

## 2014-10-22 MED ORDER — IOHEXOL 300 MG/ML  SOLN
100.0000 mL | Freq: Once | INTRAMUSCULAR | Status: AC | PRN
  Administered 2014-10-22: 100 mL via INTRAVENOUS

## 2014-10-23 ENCOUNTER — Other Ambulatory Visit (HOSPITAL_COMMUNITY): Payer: Self-pay | Admitting: Oncology

## 2014-10-23 LAB — PROTEIN, URINE, 24 HOUR
Collection Interval-UPROT: 24 hours
PROTEIN, URINE: 173 mg/dL — AB (ref 5–25)
Protein, 24H Urine: 4152 mg/d — ABNORMAL HIGH (ref <150)
Urine Total Volume-UPROT: 2400 mL

## 2014-10-30 ENCOUNTER — Encounter (HOSPITAL_COMMUNITY): Payer: Self-pay | Admitting: Genetic Counselor

## 2014-10-30 ENCOUNTER — Encounter (HOSPITAL_BASED_OUTPATIENT_CLINIC_OR_DEPARTMENT_OTHER): Payer: 59 | Admitting: Genetic Counselor

## 2014-10-30 ENCOUNTER — Encounter: Payer: Self-pay | Admitting: *Deleted

## 2014-10-30 ENCOUNTER — Encounter (HOSPITAL_BASED_OUTPATIENT_CLINIC_OR_DEPARTMENT_OTHER): Payer: 59 | Admitting: Oncology

## 2014-10-30 ENCOUNTER — Encounter (HOSPITAL_COMMUNITY): Payer: 59 | Attending: Hematology & Oncology

## 2014-10-30 VITALS — BP 114/65 | HR 63 | Temp 97.8°F | Resp 18 | Wt 180.4 lb

## 2014-10-30 DIAGNOSIS — R040 Epistaxis: Secondary | ICD-10-CM | POA: Diagnosis not present

## 2014-10-30 DIAGNOSIS — Z800 Family history of malignant neoplasm of digestive organs: Secondary | ICD-10-CM | POA: Diagnosis not present

## 2014-10-30 DIAGNOSIS — Z803 Family history of malignant neoplasm of breast: Secondary | ICD-10-CM | POA: Diagnosis not present

## 2014-10-30 DIAGNOSIS — C187 Malignant neoplasm of sigmoid colon: Secondary | ICD-10-CM

## 2014-10-30 DIAGNOSIS — C787 Secondary malignant neoplasm of liver and intrahepatic bile duct: Secondary | ICD-10-CM | POA: Diagnosis not present

## 2014-10-30 DIAGNOSIS — C189 Malignant neoplasm of colon, unspecified: Secondary | ICD-10-CM

## 2014-10-30 DIAGNOSIS — Z5111 Encounter for antineoplastic chemotherapy: Secondary | ICD-10-CM

## 2014-10-30 DIAGNOSIS — R809 Proteinuria, unspecified: Secondary | ICD-10-CM

## 2014-10-30 DIAGNOSIS — Z809 Family history of malignant neoplasm, unspecified: Secondary | ICD-10-CM

## 2014-10-30 DIAGNOSIS — Z315 Encounter for genetic counseling: Secondary | ICD-10-CM

## 2014-10-30 LAB — COMPREHENSIVE METABOLIC PANEL
ALBUMIN: 2.5 g/dL — AB (ref 3.5–5.0)
ALT: 28 U/L (ref 17–63)
AST: 37 U/L (ref 15–41)
Alkaline Phosphatase: 105 U/L (ref 38–126)
Anion gap: 8 (ref 5–15)
BUN: 14 mg/dL (ref 6–20)
CO2: 24 mmol/L (ref 22–32)
Calcium: 8.4 mg/dL — ABNORMAL LOW (ref 8.9–10.3)
Chloride: 106 mmol/L (ref 101–111)
Creatinine, Ser: 0.63 mg/dL (ref 0.61–1.24)
GLUCOSE: 112 mg/dL — AB (ref 65–99)
Potassium: 4.1 mmol/L (ref 3.5–5.1)
Sodium: 138 mmol/L (ref 135–145)
Total Bilirubin: 0.4 mg/dL (ref 0.3–1.2)
Total Protein: 5.9 g/dL — ABNORMAL LOW (ref 6.5–8.1)

## 2014-10-30 LAB — CBC WITH DIFFERENTIAL/PLATELET
BASOS ABS: 0 10*3/uL (ref 0.0–0.1)
BASOS PCT: 0 % (ref 0–1)
Eosinophils Absolute: 0.2 10*3/uL (ref 0.0–0.7)
Eosinophils Relative: 4 % (ref 0–5)
HCT: 36.1 % — ABNORMAL LOW (ref 39.0–52.0)
Hemoglobin: 11.7 g/dL — ABNORMAL LOW (ref 13.0–17.0)
Lymphocytes Relative: 31 % (ref 12–46)
Lymphs Abs: 1.4 10*3/uL (ref 0.7–4.0)
MCH: 26.6 pg (ref 26.0–34.0)
MCHC: 32.4 g/dL (ref 30.0–36.0)
MCV: 82 fL (ref 78.0–100.0)
MONO ABS: 0.8 10*3/uL (ref 0.1–1.0)
Monocytes Relative: 18 % — ABNORMAL HIGH (ref 3–12)
Neutro Abs: 2.1 10*3/uL (ref 1.7–7.7)
Neutrophils Relative %: 48 % (ref 43–77)
PLATELETS: 127 10*3/uL — AB (ref 150–400)
RBC: 4.4 MIL/uL (ref 4.22–5.81)
RDW: 25.6 % — AB (ref 11.5–15.5)
WBC: 4.4 10*3/uL (ref 4.0–10.5)

## 2014-10-30 MED ORDER — OXALIPLATIN CHEMO INJECTION 100 MG/20ML
85.0000 mg/m2 | Freq: Once | INTRAVENOUS | Status: AC
  Administered 2014-10-30: 170 mg via INTRAVENOUS
  Filled 2014-10-30: qty 34

## 2014-10-30 MED ORDER — SODIUM CHLORIDE 0.9 % IJ SOLN
10.0000 mL | INTRAMUSCULAR | Status: DC | PRN
  Administered 2014-10-30: 10 mL
  Filled 2014-10-30: qty 10

## 2014-10-30 MED ORDER — SODIUM CHLORIDE 0.9 % IV SOLN
Freq: Once | INTRAVENOUS | Status: AC
  Administered 2014-10-30: 8 mg via INTRAVENOUS
  Filled 2014-10-30: qty 4

## 2014-10-30 MED ORDER — FLUOROURACIL CHEMO INJECTION 2.5 GM/50ML
400.0000 mg/m2 | Freq: Once | INTRAVENOUS | Status: AC
Start: 2014-10-30 — End: 2014-10-30
  Administered 2014-10-30: 800 mg via INTRAVENOUS
  Filled 2014-10-30: qty 16

## 2014-10-30 MED ORDER — SODIUM CHLORIDE 0.9 % IV SOLN
2400.0000 mg/m2 | INTRAVENOUS | Status: DC
  Administered 2014-10-30: 4750 mg via INTRAVENOUS
  Filled 2014-10-30: qty 95

## 2014-10-30 MED ORDER — DEXTROSE 5 % IV SOLN
400.0000 mg/m2 | Freq: Once | INTRAVENOUS | Status: AC
  Administered 2014-10-30: 792 mg via INTRAVENOUS
  Filled 2014-10-30: qty 39.6

## 2014-10-30 MED ORDER — DEXTROSE 5 % IV SOLN
Freq: Once | INTRAVENOUS | Status: AC
  Administered 2014-10-30: 11:00:00 via INTRAVENOUS

## 2014-10-30 NOTE — Progress Notes (Signed)
[place] Clinical Social Work  Clinical Social Work was referred by [place] rounding for assessment of psychosocial needs due to not meeting pt at prior visits.  Clinical Social Worker met with patient at [facility] to offer support and assess for needs.  CSW explained role of CSW, options for support and answered questions about resources through [facility]. Pt denies current concerns, but was provided with CSW contact information today. Pt agrees to reach out as needed.     Clinical Social Work interventions: Resource education   Loren Racer, [place] Tuesdays 8:30-1pm Wednesdays 8:30-12pm  [phone]

## 2014-10-30 NOTE — Progress Notes (Signed)
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 Appearance is most likely to represent diffuse hepatic metastasis   07/30/2014 Initial Diagnosis Colon, biopsy, sigmoid - ADENOCARCINOMA.   07/31/2014 Tumor Marker CEA- 48.0   07/31/2014 Imaging Ct abd/pelvis- concerning for primary colorectal neoplasm in the mid to distal sigmoid colon, with lymphadenopathy in the sigmoid mesocolon, ileocolic mesentery, and retroperitoneum, as well as widespread metastatic disease to the liver   08/01/2014 Imaging CT chest- Pathologic thoracic, right hilar, and infrahilar adenopathy associated with a masslike region of possible consolidation in the right middle lobe, surrounding nodularity, and some scattered bilateral pulmonary nodules.   08/06/2014 -  Chemotherapy FOLFOX.  Avastin added for cycle 2.   10/15/2014 Treatment Plan Change Avastin held for proteinuria.   10/22/2014 Imaging Ct CAP- 1. Interval decrease and soft tissue adjacent to the mid sigmoid colon with decreasing sigmoid colon wall thickening. The  leocolic, mesenteric, perirectal, and mesocolonic lymphadenopathy has decreased in the interval.   10/30/2014 Survivorship Genetic Counseling    I personally reviewed and went over laboratory results with the patient.  The results are noted within this dictation.  I personally reviewed and  went over radiographic studies with the patient.  The results are noted within this dictation.    He is tolerating therapy well without any complaints.  He had a host of questions regarding his response to therapy.  I reviewed his scans in detail and reviewed the images with his wife and family.    Past Medical History  Diagnosis Date  . 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     has No Known Allergies.  Current Outpatient Prescriptions on File Prior to Visit  Medication Sig Dispense Refill  . b complex vitamins tablet Take 1 tablet by mouth daily.    [name] docusate sodium (COLACE) 100 MG capsule Take 1 capsule (100 mg total) by mouth 2 (two) times daily. (Patient taking differently: Take 100 mg by mouth daily. 3 in the am and 2 at night) 60 capsule 0  . ferrous sulfate 325 (65 FE) MG tablet Take 1 tablet (325 mg total) by mouth 2 (two) times daily. 60 tablet 0  . ibuprofen (ADVIL,MOTRIN) 200 MG tablet Take 60 mg by mouth every 6 (six) hours as needed for mild pain.    [name] leucovorin in dextrose 5 % 250 mL Inject into the vein once. Every 14 days. To begin Monday 08/06/14    . lidocaine-prilocaine (EMLA) cream Apply a quarter size amount to port site 1 hour prior to chemo. Do not rub in. Cover with plastic wrap. 30 g 3  . Multiple Vitamin (MULTIVITAMIN WITH MINERALS) TABS tablet Take 1 tablet by mouth daily.    . ondansetron (ZOFRAN) 8 MG tablet Take 1 tablet (8  mg total) by mouth every 8 (eight) hours as needed for nausea or vomiting. 30 tablet 3  . OXALIPLATIN IV Inject into the vein. Every 14 days. To begin Monday 08/06/14    . oxyCODONE-acetaminophen (PERCOCET/ROXICET) 5-325 MG per tablet Take 1-2 tablets by mouth every 4 (four) hours as needed for moderate pain. 30 tablet 0  . polyethylene glycol (MIRALAX /  GLYCOLAX) packet Take 17 g by mouth daily as needed for mild constipation. 14 each 0  . prochlorperazine (COMPAZINE) 10 MG tablet Take 1 tablet (10 mg total) by mouth every 6 (six) hours as needed for nausea or vomiting. 30 tablet 3  . ranitidine (ZANTAC) 150 MG capsule Take 150 mg by mouth 2 (two) times daily.    . sodium chloride 0.9 % SOLN 150 mL with fluorouracil 5 GM/100ML SOLN 200 mg/m2/day Inject 200 mg/m2/day into the vein. Every 14 days. To begin Monday 08/06/14. To infuse over 46 hours.     Current Facility-Administered Medications on File Prior to Visit  Medication Dose Route Frequency Provider Last Rate Last Dose  . sodium chloride 0.9 % injection 10 mL  10 mL Intravenous PRN Patrici Ranks, MD   10 mL at 08/08/14 1237    Past Surgical History  Procedure Laterality Date  . Esophagogastroduodenoscopy N/A 07/30/2014    Procedure: ESOPHAGOGASTRODUODENOSCOPY (EGD);  Surgeon: Inda Castle, MD;  Location: [facility];  Service: Endoscopy;  Laterality: N/A;  . Colonoscopy N/A 07/30/2014    Procedure: COLONOSCOPY;  Surgeon: Inda Castle, MD;  Location: [facility] ENDOSCOPY;  Service: Endoscopy;  Laterality: N/A;  . Portacath placement Right 08/01/14  . Portacath placement N/A 08/01/2014    Procedure: INSERTION RIGHT IJ PORT-A-CATH WITH ULTRASOUND AND FLUORO;  Surgeon: Gayland Curry, MD;  Location: [facility] ORS;  Service: General;  Laterality: N/A;  . Operative ultrasound N/A 08/01/2014    Procedure: OPERATIVE ULTRASOUND;  Surgeon: Gayland Curry, MD;  Location: [facility] ORS;  Service: General;  Laterality: N/A;    Denies any headaches, dizziness, double vision, fevers, chills, night sweats, nausea, vomiting, diarrhea, constipation, chest pain, heart palpitations, shortness of breath, blood in stool, black tarry stool, urinary pain, urinary burning, urinary frequency, hematuria.   PHYSICAL EXAMINATION  ECOG PERFORMANCE STATUS: 1 - Symptomatic but completely ambulatory  There were no vitals filed for this  visit.  GENERAL:alert, no distress, well nourished, well developed, comfortable, cooperative and smiling SKIN: skin color, texture, turgor are normal, no rashes or significant lesions HEAD: Normocephalic, No masses, lesions, tenderness or abnormalities EYES: normal, PERRLA, EOMI, Conjunctiva are pink and non-injected EARS: External ears normal OROPHARYNX:lips, buccal mucosa, and tongue normal and mucous membranes are moist  NECK: supple, no adenopathy, thyroid normal size, non-tender, without nodularity, no stridor, non-tender, trachea midline LYMPH:  no palpable lymphadenopathy BREAST:not examined LUNGS: clear to auscultation and percussion HEART: regular rate & rhythm, no murmurs and no gallops ABDOMEN:abdomen soft and normal bowel sounds BACK: Back symmetric, no curvature. EXTREMITIES:less then 2 second capillary refill, no joint deformities, effusion, or inflammation, no skin discoloration, no cyanosis  NEURO: alert & oriented x 3 with fluent speech, no focal motor/sensory deficits   LABORATORY DATA: CBC    Component Value Date/Time   WBC 4.4 10/30/2014 0940   RBC 4.40 10/30/2014 0940   RBC 3.42* 07/28/2014 1440   HGB 11.7* 10/30/2014 0940   HCT 36.1* 10/30/2014 0940   PLT 127* 10/30/2014 0940   MCV 82.0 10/30/2014 0940   MCH 26.6 10/30/2014 0940   MCHC  32.4 10/30/2014 0940   RDW 25.6* 10/30/2014 0940   LYMPHSABS 1.4 10/30/2014 0940   MONOABS 0.8 10/30/2014 0940   EOSABS 0.2 10/30/2014 0940   BASOSABS 0.0 10/30/2014 0940      Chemistry      Component Value Date/Time   NA 138 10/30/2014 0940   K 4.1 10/30/2014 0940   CL 106 10/30/2014 0940   CO2 24 10/30/2014 0940   BUN 14 10/30/2014 0940   CREATININE 0.63 10/30/2014 0940      Component Value Date/Time   CALCIUM 8.4* 10/30/2014 0940   ALKPHOS 105 10/30/2014 0940   AST 37 10/30/2014 0940   ALT 28 10/30/2014 0940   BILITOT 0.4 10/30/2014 0940      Lab Results  Component Value Date   CEA 7.7* 10/01/2014     RADIOGRAPHIC STUDIES:  Ct Chest W Contrast  10/22/2014   CLINICAL DATA:  Subsequent encounter for colon cancer with liver metastases.  EXAM: CT CHEST, ABDOMEN, AND PELVIS WITH CONTRAST  TECHNIQUE: Multidetector CT imaging of the chest, abdomen and pelvis was performed following the standard protocol during bolus administration of intravenous contrast.  CONTRAST:  111mL OMNIPAQUE IOHEXOL 300 MG/ML  SOLN  COMPARISON:  CT chest from 07/31/2014. CT abdomen and pelvis from 07/30/2014.  FINDINGS: CT CHEST FINDINGS  Mediastinum/Nodes: Right Port-A-Cath tip projects at the junction of the SVC and RA. There is no axillary lymphadenopathy. Previously measured 12 mm short axis precarinal lymph node now measures 6 mm. 17 mm short axis subcarinal lymph node on the previous study has decreased to 9 mm short axis. The right hilar nodal conglomeration measured previously has resolved. The infrahilar right lymph node measured previously has also resolved with no measurable soft tissue at this location today.  Thoracic aorta is normal in appearance. Heart size is normal. No pericardial effusion.  Lungs/Pleura: The tiny right upper lobe pulmonary nodules seen previously have resolved. The 8 x 10 mm right lower lobe nodule seen previously now measures 3 mm. The ill-defined right middle lobe nodule measured previously at 3.4 x 2.3 cm has decreased to 6 x 7 mm. The small peripheral left lung nodule seen previously has resolved.  Musculoskeletal: Bone windows reveal no worrisome lytic or sclerotic osseous lesions.  CT ABDOMEN AND PELVIS FINDINGS  Hepatobiliary: 8.9 x 7.0 cm index lesion in the right liver has decreased in the interval, now measuring 5.6 x 4.6 cm. 2.7 cm posterior left hepatic lesion on the previous study has decreased to 9 mm in the interval. A second index lesion measured previously in the inferior left liver at 8.2 x 6.7 cm now measures 4.5 x 4.1 cm.  Gallbladder is decompressed. No intrahepatic or extrahepatic  biliary dilation.  Pancreas: No focal mass lesion. No dilatation of the main duct. No intraparenchymal cyst. No peripancreatic edema.  Spleen: No splenomegaly. No focal mass lesion.  Adrenals/Urinary Tract: No adrenal nodule or mass. Right kidney is unremarkable. The 4 cm upper pole cyst in the left kidney has decreased down to 2.4 cm in the interval. 2.3 cm interpolar left renal cyst is now 2.9 cm. No evidence for hydroureter. Urinary bladder is unremarkable.  Stomach/Bowel: There is a focal area of gastric wall thickening versus adherent food along the medial wall of the proximal stomach (see image 59 series 2 and image 45 series 4). Stomach otherwise unremarkable. Duodenum is normally positioned as is the ligament of Treitz. No small bowel wall thickening. No small bowel dilatation. Abnormal soft tissue attenuation adjacent to  the mid sigmoid colon has decreased in the interval, measuring 1.7 x 2.4 cm today compared to 4.2 x 3.6 cm previously. There is some tethering of distal ileum into this abnormal soft tissue attenuation. The terminal ileum is normal. The appendix is normal. No evidence for colonic obstruction. Wall thickening in the mid sigmoid colon has decreased in the interval.  Vascular/Lymphatic: No abdominal aortic aneurysm. There is no gastrohepatic or hepatoduodenal ligament lymphadenopathy. The 15 mm short axis ileocolic lymph node measured previously is now 10 mm in short axis. 13 mm short axis right paramidline mesenteric lymph node measured previously is now 8 mm in short axis (image 91 series 2). Other scattered perirectal and knee some colic lymph nodes appear smaller in the interval.  Reproductive: Prostate gland seminal vesicles are unremarkable.  Other: No intraperitoneal free fluid.  Musculoskeletal: Bone windows reveal no worrisome lytic or sclerotic osseous lesions.  IMPRESSION: 1. Interval decrease and soft tissue adjacent to the mid sigmoid colon with decreasing sigmoid colon wall  thickening. The ileocolic, mesenteric, perirectal, and mesocolonic lymphadenopathy has decreased in the interval. 2. Interval decrease in size of liver metastases. 3. Interval decrease in mediastinal lymphadenopathy with resolution of previous right hilar lymphadenopathy. 4. Multiple bilateral pulmonary nodules have decreased or resolved in the interval. 5. No evidence for new or progressive disease in the chest, abdomen, or pelvis on today's study.   Electronically Signed   By: Misty Stanley M.D.   On: 10/22/2014 16:03   Ct Abdomen Pelvis W Contrast  10/22/2014   CLINICAL DATA:  Subsequent encounter for colon cancer with liver metastases.  EXAM: CT CHEST, ABDOMEN, AND PELVIS WITH CONTRAST  TECHNIQUE: Multidetector CT imaging of the chest, abdomen and pelvis was performed following the standard protocol during bolus administration of intravenous contrast.  CONTRAST:  178mL OMNIPAQUE IOHEXOL 300 MG/ML  SOLN  COMPARISON:  CT chest from 07/31/2014. CT abdomen and pelvis from 07/30/2014.  FINDINGS: CT CHEST FINDINGS  Mediastinum/Nodes: Right Port-A-Cath tip projects at the junction of the SVC and RA. There is no axillary lymphadenopathy. Previously measured 12 mm short axis precarinal lymph node now measures 6 mm. 17 mm short axis subcarinal lymph node on the previous study has decreased to 9 mm short axis. The right hilar nodal conglomeration measured previously has resolved. The infrahilar right lymph node measured previously has also resolved with no measurable soft tissue at this location today.  Thoracic aorta is normal in appearance. Heart size is normal. No pericardial effusion.  Lungs/Pleura: The tiny right upper lobe pulmonary nodules seen previously have resolved. The 8 x 10 mm right lower lobe nodule seen previously now measures 3 mm. The ill-defined right middle lobe nodule measured previously at 3.4 x 2.3 cm has decreased to 6 x 7 mm. The small peripheral left lung nodule seen previously has resolved.   Musculoskeletal: Bone windows reveal no worrisome lytic or sclerotic osseous lesions.  CT ABDOMEN AND PELVIS FINDINGS  Hepatobiliary: 8.9 x 7.0 cm index lesion in the right liver has decreased in the interval, now measuring 5.6 x 4.6 cm. 2.7 cm posterior left hepatic lesion on the previous study has decreased to 9 mm in the interval. A second index lesion measured previously in the inferior left liver at 8.2 x 6.7 cm now measures 4.5 x 4.1 cm.  Gallbladder is decompressed. No intrahepatic or extrahepatic biliary dilation.  Pancreas: No focal mass lesion. No dilatation of the main duct. No intraparenchymal cyst. No peripancreatic edema.  Spleen: No splenomegaly. No  focal mass lesion.  Adrenals/Urinary Tract: No adrenal nodule or mass. Right kidney is unremarkable. The 4 cm upper pole cyst in the left kidney has decreased down to 2.4 cm in the interval. 2.3 cm interpolar left renal cyst is now 2.9 cm. No evidence for hydroureter. Urinary bladder is unremarkable.  Stomach/Bowel: There is a focal area of gastric wall thickening versus adherent food along the medial wall of the proximal stomach (see image 59 series 2 and image 45 series 4). Stomach otherwise unremarkable. Duodenum is normally positioned as is the ligament of Treitz. No small bowel wall thickening. No small bowel dilatation. Abnormal soft tissue attenuation adjacent to the mid sigmoid colon has decreased in the interval, measuring 1.7 x 2.4 cm today compared to 4.2 x 3.6 cm previously. There is some tethering of distal ileum into this abnormal soft tissue attenuation. The terminal ileum is normal. The appendix is normal. No evidence for colonic obstruction. Wall thickening in the mid sigmoid colon has decreased in the interval.  Vascular/Lymphatic: No abdominal aortic aneurysm. There is no gastrohepatic or hepatoduodenal ligament lymphadenopathy. The 15 mm short axis ileocolic lymph node measured previously is now 10 mm in short axis. 13 mm short axis  right paramidline mesenteric lymph node measured previously is now 8 mm in short axis (image 91 series 2). Other scattered perirectal and knee some colic lymph nodes appear smaller in the interval.  Reproductive: Prostate gland seminal vesicles are unremarkable.  Other: No intraperitoneal free fluid.  Musculoskeletal: Bone windows reveal no worrisome lytic or sclerotic osseous lesions.  IMPRESSION: 1. Interval decrease and soft tissue adjacent to the mid sigmoid colon with decreasing sigmoid colon wall thickening. The ileocolic, mesenteric, perirectal, and mesocolonic lymphadenopathy has decreased in the interval. 2. Interval decrease in size of liver metastases. 3. Interval decrease in mediastinal lymphadenopathy with resolution of previous right hilar lymphadenopathy. 4. Multiple bilateral pulmonary nodules have decreased or resolved in the interval. 5. No evidence for new or progressive disease in the chest, abdomen, or pelvis on today's study.   Electronically Signed   By: Misty Stanley M.D.   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 I have again encouraged him to let us know if he has any difficulties prior to his next follow-up in 2 weeks.  Pre-chemo labs as planned.  Labs today meet treatment parameters.   Genetic Counseling today as planned.  Repeat 24 hour urine collection for proteinuria in 1- 1.5 week.  Return in 2 weeks for follow-up, pre-chemo labs, and chemotherapy as planned.    Unfortunately, Asar has lost his job.  He is signed up for Medicaid as a result.  He notes rare epistaxis and I have recommended [employer] Spr[employer] (NS Spray) PRN.    THERAPY PLAN:  Positive response to therapy confirmed with CT imaging.  All questions were answered. The patient knows to  call the clinic with any problems, questions or concerns. We can certainly see the patient much sooner if necessary.  Patient and plan discussed with Dr. Ancil Linsey and she is in agreement with the aforementioned.   I spent 25 minutes counseling the patient face to face. The total time spent in the appointment was 40 minutes.  This note is electronically signed by: Doy Mince 10/30/2014 10:26 AM

## 2014-10-30 NOTE — Patient Instructions (Addendum)
[place] at [facility] Discharge Instructions  RECOMMENDATIONS MADE BY THE CONSULTANT AND ANY TEST RESULTS WILL BE SENT TO YOUR REFERRING PHYSICIAN.  Exam and discussion by Robynn Pane, PA-C. Will stop Avastin for now. Need to collect urine for 24 hours and bring in next week - see attached for instructions. Can use Ocean Spray as needed. Report fevers, uncontrolled nausea, vomiting, diarrhea or other concerns.  Follow-up in 2 weeks.   Thank you for choosing [place] at [facility] to provide your oncology and hematology care.  To afford each patient quality time with our provider, please arrive at least 15 minutes before your scheduled appointment time.    You need to re-schedule your appointment should you arrive 10 or more minutes late.  We strive to give you quality time with our providers, and arriving late affects you and other patients whose appointments are after yours.  Also, if you no show three or more times for appointments you may be dismissed from the clinic at the providers discretion.     Again, thank you for choosing [facility].  Our hope is that these requests will decrease the amount of time that you wait before being seen by our physicians.       _____________________________________________________________  Should you have questions after your visit to [facility], please contact our office at [phone] between the hours of 8:30 a.m. and 4:30 p.m.  Voicemails left after 4:30 p.m. will not be returned until the following business day.  For prescription refill requests, have your pharmacy contact our office.    24-Hour Urine Collection HOME CARE  When you get up in the morning on the day you do this test, pee (urinate) in the toilet and flush. Make a note of the time. This will be your start time on the day of collection and the end time on the next morning.  From then on, save all your  pee (urine) in the plastic jug that was given to you.  You should stop collecting your pee 24 hours after you started.  If the plastic jug that is given to you already has liquid in it, that is okay. Do not throw out the liquid or rinse out the jug. Some tests need the liquid to be added to your pee.  Keep your plastic jug cool (in an ice chest or the refrigerator) during the test.  When the 24 hours is over, bring your plastic jug to the clinic lab. Keep the jug cool (in an ice chest) while you are bringing it to the lab. Document Released: 08/14/2008 Document Revised: 08/10/2011 Document Reviewed: 08/14/2008 [facility] Patient Information 2015 [place], [place]. This information is not intended to replace advice given to you by your health care provider. Make sure you discuss any questions you have with your health care provider.

## 2014-10-30 NOTE — Patient Instructions (Signed)
[facility] Discharge Instructions for Patients Receiving Chemotherapy  Today you received the following chemotherapy agents Oxaliplatin, Leucovorin and 5FU pump.  To help prevent nausea and vomiting after your treatment, we encourage you to take your nausea medication as instructed. If you develop nausea and vomiting that is not controlled by your nausea medication, call the clinic. If it is after clinic hours your family physician or the after hours number for the clinic or go to the Emergency Department. BELOW ARE SYMPTOMS THAT SHOULD BE REPORTED IMMEDIATELY:  *FEVER GREATER THAN 101.0 F  *CHILLS WITH OR WITHOUT FEVER  NAUSEA AND VOMITING THAT IS NOT CONTROLLED WITH YOUR NAUSEA MEDICATION  *UNUSUAL SHORTNESS OF BREATH  *UNUSUAL BRUISING OR BLEEDING  TENDERNESS IN MOUTH AND THROAT WITH OR WITHOUT PRESENCE OF ULCERS  *URINARY PROBLEMS  *BOWEL PROBLEMS  UNUSUAL RASH Items with * indicate a potential emergency and should be followed up as soon as possible.  Return as scheduled Thursday to discontinue infusion pump.  I have been informed and understand all the instructions given to me. I know to contact the clinic, my physician, or go to the Emergency Department if any problems should occur. I do not have any questions at this time, but understand that I may call the clinic during office hours or the Patient Navigator at [phone] should I have any questions or need assistance in obtaining follow up care.    __________________________________________  _____________  __________ Signature of Patient or Authorized Representative            Date                   Time    __________________________________________ Nurse's Signature

## 2014-10-30 NOTE — Assessment & Plan Note (Addendum)
49 year old male with stage IV colon cancer currently on FOLFOX + Avastin with positive response on restaging imaging   He is tolerating treatment without difficulty. I have again encouraged him to let us know if he has any difficulties prior to his next follow-up in 2 weeks.  Pre-chemo labs as planned.  Labs today meet treatment parameters.   Genetic Counseling today as planned.  Repeat 24 hour urine collection for proteinuria in 1- 1.5 week.  Return in 2 weeks for follow-up, pre-chemo labs, and chemotherapy as planned.    Unfortunately, Robbi has lost his job.  He is signed up for Medicaid as a result.  He notes rare epistaxis and I have recommended Ocean Spray (NS Spray) PRN.

## 2014-10-30 NOTE — Progress Notes (Signed)
Tolerated chemo well. D/C home with continuous infusion pump intact. Patient ambulatory accompanied by wife on discharge.

## 2014-10-31 ENCOUNTER — Telehealth (HOSPITAL_COMMUNITY): Payer: Self-pay | Admitting: Emergency Medicine

## 2014-10-31 ENCOUNTER — Encounter (HOSPITAL_COMMUNITY): Payer: 59

## 2014-10-31 LAB — CEA: CEA: 3.9 ng/mL (ref 0.0–4.7)

## 2014-10-31 NOTE — Progress Notes (Signed)
REFERRING PROVIDER: Lemmie Evens, MD [address] [place], [place] [address]  PRIMARY PROVIDER:  [name], MD  PRIMARY REASON FOR VISIT:  1. Colon cancer   2. Family history of colon cancer in mother   42. Family history of cancer      HISTORY OF PRESENT ILLNESS:   Tom [name], a 49 y.o. male, was seen for a [place] cancer genetics consultation at the request of Dr. Karie Kirks due to a personal and family history of cancer.  Tom [name] presents to clinic today to discuss the possibility of a hereditary predisposition to cancer, genetic testing, and to further clarify his future cancer risks, as well as potential cancer risks for family members.   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 This was treated with surgery and chemotherapy.  Tom [name] is a non-smoker, non-drinker, and former smokeless tobacco user (quit in 1991).   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 Appearance is most likely to represent diffuse hepatic metastasis   07/30/2014 Initial Diagnosis Colon, biopsy, sigmoid - ADENOCARCINOMA.   07/31/2014 Tumor Marker CEA- 48.0   07/31/2014 Imaging Ct abd/pelvis- concerning for primary colorectal neoplasm in the mid to distal sigmoid colon, with lymphadenopathy in the sigmoid mesocolon, ileocolic mesentery, and retroperitoneum, as well as widespread metastatic disease to the liver   08/01/2014 Imaging CT chest- Pathologic thoracic, right hilar, and infrahilar adenopathy associated with a masslike region of possible consolidation in the right middle lobe, surrounding nodularity, and some scattered bilateral pulmonary nodules.   08/06/2014 -  Chemotherapy FOLFOX.   Avastin added for cycle 2.   10/15/2014 Treatment Plan Change Avastin held for proteinuria.   10/22/2014 Imaging Ct CAP- 1. Interval decrease and soft tissue adjacent to the mid sigmoid colon with decreasing sigmoid colon wall thickening. The  leocolic, mesenteric, perirectal, and mesocolonic lymphadenopathy has decreased in the interval.   10/30/2014 Survivorship Genetic Counseling     HORMONAL RISK FACTORS:  Colonoscopy: yes; abnormal. Polyps: no. MSI/IHC: not examined. [employer] Tumor Test: alterations observed in PIK3CA, APC, BCL2L1, TOP1, TP53  Past Medical History  Diagnosis Date  . 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 Esophagogastroduodenoscopy N/A 07/30/2014    Procedure: ESOPHAGOGASTRODUODENOSCOPY (EGD);  Surgeon: Inda Castle, MD;  Location: [facility];  Service: Endoscopy;  Laterality: N/A;  . Colonoscopy N/A 07/30/2014    Procedure: COLONOSCOPY;  Surgeon: Inda Castle, MD;  Location: [facility];  Service: Endoscopy;  Laterality: N/A;  . Portacath placement Right 08/01/14  . Portacath placement N/A 08/01/2014    Procedure: INSERTION RIGHT IJ PORT-A-CATH WITH ULTRASOUND AND FLUORO;  Surgeon: Gayland Curry, MD;  Location: [facility];  Service: General;  Laterality: N/A;  . Operative ultrasound N/A 08/01/2014    Procedure: OPERATIVE ULTRASOUND;  Surgeon: Gayland Curry, MD;  Location: [facility];  Service: General;  Laterality: N/A;    History   Social History  . Marital Status: Single    Spouse Name: N/A  . Number of Children: N/A  . Years of Education: N/A   Social History Main Topics  . Smoking status: Never Smoker   . Smokeless tobacco: Former Systems developer  Types: Sarina Ser    Quit date: 06/01/1989  . Alcohol Use: No  . Drug Use: No  . Sexual Activity: Not on file   Other Topics Concern  . Not on file   Social History Narrative     FAMILY HISTORY:  We obtained a detailed, 4-generation family  history.  Significant diagnoses are listed below: Family History  Problem Relation Age of Onset  . Cancer Mother     Deceased at 17 years old from metastatic colon cancer  . Kidney failure Father   . Heart disease Father     Deceased in 40-50's  . Colon polyps Brother   . Cancer Maternal Aunt    Tom [name] was diagnosed with colon cancer at 89.  His mother was diagnosed with colon cancer, which metastisized to her liver.  She passed away at 58.  Tom [name] has eight brothers (ages 39-58), at least one of whom has had some polyps removed following a colonoscopy.  He also has one sister, age 24, who is reportedly cancer-free.  He has three maternal aunts who were diagnosed with unspecified types of cancer in their late 14s and who have since passed away.  His maternal grandparents lived cancer-free into their late 90s-100s.  Tom [name] father passed way in his 27s or 32s due to heart problems.  Tom [name] has a paternal uncle who passed away later in life, and he has no additional information regarding his paternal uncle or paternal grandparents. Patient's maternal ancestors are of Caucasian descent, and paternal ancestors are of Caucasian descent. There is no reported Ashkenazi Jewish ancestry. There is no known consanguinity.  GENETIC COUNSELING ASSESSMENT: Tom [name] is a 49 y.o. male with a personal and family history which is somewhat suggestive of a hereditary vs familial cancer syndrome and predisposition to cancer. We, therefore, discussed and recommended the following at today's visit.   DISCUSSION: We reviewed the characteristics, features and inheritance patterns of hereditary cancer syndromes. We also discussed genetic testing, the process of testing, insurance coverage and turn-around-time for results. We discussed the implications of a negative, positive and/or variant of uncertain significant result. Additionally, we reviewed Tom [name]'s FoundationOne tumor test  results (APC and TP53 alterations were discovered), and what these results could mean for genetic testing.  We presented Tom [name] with testing options for hereditary colon cancer syndromes, and we recommended Tom [name] pursue genetic testing for the "Colorectal Cancer Panel" gene panel through [facility].  This panel will look for changes within 19 genes associated with increased risks for colorectal cancer.  Those 19 genes are APC, ATM, AXIN2, BMPR1A, CDH1, CHEK2, EPCAM, MLH1, MSH2, MSH6, MUTYH, PMS2, POLD1, POLE, PTEN, SCG5/GREM1, SMAD4, STK11, and TP53.    Based on Tom [name] personal history of cancer, specifically his age at diagnosis, he meets medical criteria for genetic testing. Despite that he meets criteria, he may still have an out of pocket cost. We discussed that if his out of pocket cost for testing is over $100, the laboratory will call and confirm whether he wants to proceed with testing.  If the out of pocket cost of testing is less than $100 he will be billed by the genetic testing laboratory.   PLAN: After considering the risks, benefits, and limitations, Tom [name]  provided informed consent to pursue genetic testing and the blood sample was sent to [facility] for analysis of the 19-gene Comprehensive Cancer Panel (genes assessed: APC, ATM, AXIN2, BMPR1A, CDH1, CHEK2, EPCAM, MLH1, MSH2, MSH6, MUTYH,  PMS2, POLD1, POLE, PTEN, SCG5/GREM1, SMAD4, STK11, and TP53). Results should be available within approximately 3 weeks' time, at which point they will be disclosed by telephone to Tom [name], as will any additional recommendations warranted by these results. Tom [name] will receive a summary of his genetic counseling visit and a copy of his results once available. This information will also be available in Epic. We encouraged Tom [name] to remain in contact with cancer genetics annually so that we can continuously update the family history and inform him of any changes in  cancer genetics and testing that may be of benefit for his family. Tom [name] questions were answered to his satisfaction today. Our contact information was provided should additional questions or concerns arise.  Mr.  Riano questions were answered to his satisfaction today. Our contact information was provided should additional questions or concerns arise. Thank you for the referral and allowing [name] to share in the care of your patient.   Jeanine Luz, MS Genetic Counselor [email or web address] phone: [phone]  The patient was seen for a total of 30 minutes in face-to-face genetic counseling.  This patient was discussed with Drs. Magrinat, Lindi Adie and/or Burr Medico who agrees with the above.    _______________________________________________________________________ For Office Staff:  Number of people involved in session:1 Was an Intern/ student involved with case: no

## 2014-10-31 NOTE — Telephone Encounter (Signed)
-----   Message from Baird Cancer, PA-C sent at 10/31/2014  3:41 PM EDT ----- WNL

## 2014-10-31 NOTE — Telephone Encounter (Signed)
Called to let pt know that his CEA was WNL

## 2014-11-01 ENCOUNTER — Encounter (HOSPITAL_COMMUNITY): Payer: Self-pay

## 2014-11-01 ENCOUNTER — Encounter (HOSPITAL_COMMUNITY): Payer: 59 | Attending: Hematology & Oncology

## 2014-11-01 VITALS — BP 102/60 | HR 70 | Temp 98.5°F | Resp 18

## 2014-11-01 DIAGNOSIS — C7802 Secondary malignant neoplasm of left lung: Secondary | ICD-10-CM

## 2014-11-01 DIAGNOSIS — C787 Secondary malignant neoplasm of liver and intrahepatic bile duct: Secondary | ICD-10-CM | POA: Diagnosis not present

## 2014-11-01 DIAGNOSIS — C7801 Secondary malignant neoplasm of right lung: Secondary | ICD-10-CM

## 2014-11-01 DIAGNOSIS — C189 Malignant neoplasm of colon, unspecified: Secondary | ICD-10-CM

## 2014-11-01 DIAGNOSIS — C187 Malignant neoplasm of sigmoid colon: Secondary | ICD-10-CM | POA: Diagnosis not present

## 2014-11-01 MED ORDER — HEPARIN SOD (PORK) LOCK FLUSH 100 UNIT/ML IV SOLN
500.0000 [IU] | Freq: Once | INTRAVENOUS | Status: AC | PRN
  Administered 2014-11-01: 500 [IU]
  Filled 2014-11-01: qty 5

## 2014-11-01 MED ORDER — SODIUM CHLORIDE 0.9 % IJ SOLN
10.0000 mL | INTRAMUSCULAR | Status: DC | PRN
  Administered 2014-11-01: 10 mL
  Filled 2014-11-01: qty 10

## 2014-11-01 NOTE — Progress Notes (Signed)
..  Tom [name] returns today for port de access and flush after 46 hr continous infusion of 35fu. Tolerated infusion without problems. C/o increased weakness today. BM x 4 today, not loose and states he would not consider it diarrhea. Taking fluids well and no nausea. Headache on day 2. Subsided now. Portacath located rt chest wall deaccessed of needle. Good blood return present. Portacath flushed with 43ml NS and 500U/83ml Heparin and needle removed intact. Procedure without incident. Patient tolerated procedure well.

## 2014-11-08 ENCOUNTER — Other Ambulatory Visit (HOSPITAL_COMMUNITY)
Admission: RE | Admit: 2014-11-08 | Discharge: 2014-11-08 | Disposition: A | Discharge status: Home / Self Care | Payer: Medicaid Other | Source: Ambulatory Visit | Attending: Family Medicine | Admitting: Family Medicine

## 2014-11-08 DIAGNOSIS — R809 Proteinuria, unspecified: Secondary | ICD-10-CM | POA: Insufficient documentation

## 2014-11-09 ENCOUNTER — Encounter (HOSPITAL_COMMUNITY): Payer: 59 | Attending: Hematology & Oncology

## 2014-11-09 DIAGNOSIS — R809 Proteinuria, unspecified: Secondary | ICD-10-CM

## 2014-11-09 LAB — PROTEIN, URINE, 24 HOUR
Collection Interval-UPROT: 24 hours
Protein, 24H Urine: 2074 mg/d — ABNORMAL HIGH (ref 50–100)
Protein, Urine: 143 mg/dL
Urine Total Volume-UPROT: 1450 mL

## 2014-11-09 NOTE — Progress Notes (Signed)
24HR URINE DROP OFF

## 2014-11-12 ENCOUNTER — Encounter (HOSPITAL_BASED_OUTPATIENT_CLINIC_OR_DEPARTMENT_OTHER): Payer: 59 | Admitting: Hematology & Oncology

## 2014-11-12 ENCOUNTER — Encounter (HOSPITAL_COMMUNITY): Payer: Self-pay | Admitting: Hematology & Oncology

## 2014-11-12 ENCOUNTER — Encounter (HOSPITAL_BASED_OUTPATIENT_CLINIC_OR_DEPARTMENT_OTHER): Payer: 59

## 2014-11-12 VITALS — BP 118/73 | HR 60 | Temp 98.0°F | Resp 18

## 2014-11-12 VITALS — BP 102/77 | HR 75 | Temp 98.1°F | Resp 16 | Wt 181.7 lb

## 2014-11-12 DIAGNOSIS — C7801 Secondary malignant neoplasm of right lung: Secondary | ICD-10-CM

## 2014-11-12 DIAGNOSIS — C189 Malignant neoplasm of colon, unspecified: Secondary | ICD-10-CM

## 2014-11-12 DIAGNOSIS — C187 Malignant neoplasm of sigmoid colon: Secondary | ICD-10-CM | POA: Diagnosis present

## 2014-11-12 DIAGNOSIS — G629 Polyneuropathy, unspecified: Secondary | ICD-10-CM

## 2014-11-12 DIAGNOSIS — C7802 Secondary malignant neoplasm of left lung: Secondary | ICD-10-CM

## 2014-11-12 DIAGNOSIS — C787 Secondary malignant neoplasm of liver and intrahepatic bile duct: Secondary | ICD-10-CM | POA: Diagnosis not present

## 2014-11-12 DIAGNOSIS — D509 Iron deficiency anemia, unspecified: Secondary | ICD-10-CM

## 2014-11-12 DIAGNOSIS — Z5111 Encounter for antineoplastic chemotherapy: Secondary | ICD-10-CM | POA: Diagnosis not present

## 2014-11-12 DIAGNOSIS — T451X5A Adverse effect of antineoplastic and immunosuppressive drugs, initial encounter: Secondary | ICD-10-CM

## 2014-11-12 DIAGNOSIS — G620 Drug-induced polyneuropathy: Secondary | ICD-10-CM

## 2014-11-12 LAB — CBC WITH DIFFERENTIAL/PLATELET
BASOS PCT: 0 % (ref 0–1)
Basophils Absolute: 0 10*3/uL (ref 0.0–0.1)
EOS PCT: 3 % (ref 0–5)
Eosinophils Absolute: 0.2 10*3/uL (ref 0.0–0.7)
HCT: 36.6 % — ABNORMAL LOW (ref 39.0–52.0)
Hemoglobin: 12.1 g/dL — ABNORMAL LOW (ref 13.0–17.0)
Lymphocytes Relative: 25 % (ref 12–46)
Lymphs Abs: 1.2 10*3/uL (ref 0.7–4.0)
MCH: 27.8 pg (ref 26.0–34.0)
MCHC: 33.1 g/dL (ref 30.0–36.0)
MCV: 83.9 fL (ref 78.0–100.0)
MONO ABS: 0.7 10*3/uL (ref 0.1–1.0)
MONOS PCT: 16 % — AB (ref 3–12)
Neutro Abs: 2.6 10*3/uL (ref 1.7–7.7)
Neutrophils Relative %: 56 % (ref 43–77)
Platelets: 110 10*3/uL — ABNORMAL LOW (ref 150–400)
RBC: 4.36 MIL/uL (ref 4.22–5.81)
RDW: 23.4 % — AB (ref 11.5–15.5)
WBC: 4.6 10*3/uL (ref 4.0–10.5)

## 2014-11-12 LAB — COMPREHENSIVE METABOLIC PANEL
ALK PHOS: 113 U/L (ref 38–126)
ALT: 26 U/L (ref 17–63)
ANION GAP: 6 (ref 5–15)
AST: 39 U/L (ref 15–41)
Albumin: 2.6 g/dL — ABNORMAL LOW (ref 3.5–5.0)
BUN: 16 mg/dL (ref 6–20)
CALCIUM: 8.7 mg/dL — AB (ref 8.9–10.3)
CO2: 25 mmol/L (ref 22–32)
Chloride: 109 mmol/L (ref 101–111)
Creatinine, Ser: 0.76 mg/dL (ref 0.61–1.24)
GFR calc Af Amer: >60 mL/min (ref >60)
GLUCOSE: 92 mg/dL (ref 65–99)
POTASSIUM: 4 mmol/L (ref 3.5–5.1)
Sodium: 140 mmol/L (ref 135–145)
Total Bilirubin: 0.4 mg/dL (ref 0.3–1.2)
Total Protein: 6.1 g/dL — ABNORMAL LOW (ref 6.5–8.1)

## 2014-11-12 MED ORDER — SODIUM CHLORIDE 0.9 % IV SOLN
2400.0000 mg/m2 | INTRAVENOUS | Status: DC
  Administered 2014-11-12: 4750 mg via INTRAVENOUS
  Filled 2014-11-12: qty 95

## 2014-11-12 MED ORDER — SODIUM CHLORIDE 0.9 % IV SOLN
Freq: Once | INTRAVENOUS | Status: AC
  Administered 2014-11-12: 8 mg via INTRAVENOUS
  Filled 2014-11-12: qty 4

## 2014-11-12 MED ORDER — HEPARIN SOD (PORK) LOCK FLUSH 100 UNIT/ML IV SOLN: 500.0000 [IU] | Freq: Once | INTRAVENOUS | Status: DC | PRN

## 2014-11-12 MED ORDER — LEUCOVORIN CALCIUM INJECTION 350 MG
400.0000 mg/m2 | Freq: Once | INTRAVENOUS | Status: AC
  Administered 2014-11-12: 792 mg via INTRAVENOUS
  Filled 2014-11-12: qty 39.6

## 2014-11-12 MED ORDER — FLUOROURACIL CHEMO INJECTION 2.5 GM/50ML
400.0000 mg/m2 | Freq: Once | INTRAVENOUS | Status: AC
  Administered 2014-11-12: 800 mg via INTRAVENOUS
  Filled 2014-11-12: qty 16

## 2014-11-12 MED ORDER — DEXTROSE 5 % IV SOLN
Freq: Once | INTRAVENOUS | Status: AC
  Administered 2014-11-12: 10:00:00 via INTRAVENOUS

## 2014-11-12 MED ORDER — OXALIPLATIN CHEMO INJECTION 100 MG/20ML
85.0000 mg/m2 | Freq: Once | INTRAVENOUS | Status: AC
  Administered 2014-11-12: 170 mg via INTRAVENOUS
  Filled 2014-11-12: qty 34

## 2014-11-12 MED ORDER — SODIUM CHLORIDE 0.9 % IJ SOLN: 10.0000 mL | INTRAMUSCULAR | Status: DC | PRN

## 2014-11-12 NOTE — Patient Instructions (Signed)
[place] at [facility] Discharge Instructions  RECOMMENDATIONS MADE BY THE CONSULTANT AND ANY TEST RESULTS WILL BE SENT TO YOUR REFERRING PHYSICIAN.  Exam and discussion by Dr. Whitney Muse. Will continue with current therapy. Report uncontrolled nausea, vomiting, diarrhea or other problems.  Follow-up in 2 weeks with chemotherapy and office visit.  Thank you for choosing [place] at [facility] to provide your oncology and hematology care.  To afford each patient quality time with our provider, please arrive at least 15 minutes before your scheduled appointment time.    You need to re-schedule your appointment should you arrive 10 or more minutes late.  We strive to give you quality time with our providers, and arriving late affects you and other patients whose appointments are after yours.  Also, if you no show three or more times for appointments you may be dismissed from the clinic at the providers discretion.     Again, thank you for choosing [facility].  Our hope is that these requests will decrease the amount of time that you wait before being seen by our physicians.       _____________________________________________________________  Should you have questions after your visit to [facility], please contact our office at [phone] between the hours of 8:30 a.m. and 4:30 p.m.  Voicemails left after 4:30 p.m. will not be returned until the following business day.  For prescription refill requests, have your pharmacy contact our office.

## 2014-11-12 NOTE — Patient Instructions (Addendum)
[facility] Discharge Instructions for Patients Receiving Chemotherapy  Today you received the following chemotherapy agents folfox Please call the clinic you have any questions or concerns Follow up as scheduled in 2 weeks with chemotherapy and to see the doctor. Return Wednesday to have your pump removed.   To help prevent nausea and vomiting after your treatment, we encourage you to take your nausea medication    If you develop nausea and vomiting, or diarrhea that is not controlled by your medication, call the clinic.  The clinic phone number is [phone]. Office hours are Monday-Friday 8:30am-5:00pm.  BELOW ARE SYMPTOMS THAT SHOULD BE REPORTED IMMEDIATELY:  *FEVER GREATER THAN 101.0 F  *CHILLS WITH OR WITHOUT FEVER  NAUSEA AND VOMITING THAT IS NOT CONTROLLED WITH YOUR NAUSEA MEDICATION  *UNUSUAL SHORTNESS OF BREATH  *UNUSUAL BRUISING OR BLEEDING  TENDERNESS IN MOUTH AND THROAT WITH OR WITHOUT PRESENCE OF ULCERS  *URINARY PROBLEMS  *BOWEL PROBLEMS  UNUSUAL RASH Items with * indicate a potential emergency and should be followed up as soon as possible. If you have an emergency after office hours please contact your primary care physician or go to the nearest emergency department.  Please call the clinic during office hours if you have any questions or concerns.   You may also contact the Patient Navigator at [phone] should you have any questions or need assistance in obtaining follow up care. _____________________________________________________________________ Have you asked about our STAR program?    STAR stands for Survivorship Training and Rehabilitation, and this is a nationally recognized cancer care program that focuses on survivorship and rehabilitation.  Cancer and cancer treatments may cause problems, such as, pain, making you feel tired and keeping you from doing the things that you need or want to do. Cancer rehabilitation can help.  Our goal is to reduce these troubling effects and help you have the best quality of life possible.  You may receive a survey from a nurse that asks questions about your current state of health.  Based on the survey results, all eligible patients will be referred to the [facility] program for an evaluation so we can better serve you! A frequently asked questions sheet is available upon request.

## 2014-11-12 NOTE — Progress Notes (Signed)
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 He has no major complaints today.   He now has medicaid but states financial issues are very difficult. No one will hire him because "he has cancer" He feels frustrated that his boss fired him as he had worked at his prior place of employment for many years.Tom [name]  He is present today with his wife.  He says that he is overall doing okay however he has complaints of discomfort in his fingertips.  It occurs only when he touches cold things and describes it as 'feelings of pricks of ice'.  He has not experienced this yet today.  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  Has lower back pains that occur from time to time, most recent is last night.  Says he usually takes 3-4 advil to alleviate the pain however, this did not work last night as the pain was the worse he has felt.  Alleviated with Oxycodone. He states the pain is gone today. Last CT imaging was 10/22/2014.  Bowel movements are better and 'how they used to be', 3-4 times a day.  Says he sometimes just has difficulty differentiating between gas and an actual bowel movement.     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 Appearance is most likely to represent diffuse hepatic metastasis   07/30/2014 Initial Diagnosis Colon, biopsy, sigmoid - ADENOCARCINOMA.   07/31/2014 Tumor Marker CEA- 48.0   07/31/2014 Imaging Ct  abd/pelvis- concerning for primary colorectal neoplasm in the mid to distal sigmoid colon, with lymphadenopathy in the sigmoid mesocolon, ileocolic mesentery, and retroperitoneum, as well as widespread metastatic disease to the liver   08/01/2014 Imaging CT chest- Pathologic thoracic, right hilar, and infrahilar adenopathy associated with a masslike region of possible consolidation in the right middle lobe, surrounding nodularity, and some scattered bilateral pulmonary nodules.   08/06/2014 -  Chemotherapy FOLFOX.  Avastin added for cycle 2.   10/08/2014 Adverse Reaction Avastin induced proteinuria >/= 2 gm Avastin on hold   10/15/2014 Treatment Plan Change Avastin held for proteinuria.   10/22/2014 Imaging Ct CAP- 1. Interval decrease and soft tissue adjacent to the mid sigmoid colon with decreasing sigmoid colon wall thickening. The  leocolic, mesenteric, perirectal, and mesocolonic lymphadenopathy has decreased in the interval.   10/30/2014 Survivorship Genetic Counseling    I personally reviewed and went over laboratory results with the patient.  The results are noted within this dictation.  Past Medical History  Diagnosis Date  . 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     has No Known Allergies.  [name] does not currently have medications on file.  Past Surgical History  Procedure Laterality Date  . Esophagogastroduodenoscopy N/A 07/30/2014    Procedure: ESOPHAGOGASTRODUODENOSCOPY (EGD);  Surgeon: Tom Castle, MD;  Location: [facility];  Service: Endoscopy;  Laterality: N/A;  . Colonoscopy N/A 07/30/2014    Procedure: COLONOSCOPY;  Surgeon: Tom Castle, MD;  Location: [facility];  Service: Endoscopy;  Laterality: N/A;  . Portacath placement Right 08/01/14  . Portacath placement N/A 08/01/2014     Procedure: INSERTION RIGHT IJ PORT-A-CATH WITH ULTRASOUND AND FLUORO;  Surgeon: Tom Curry, MD;  Location: [facility];  Service: General;  Laterality: N/A;  . Operative ultrasound N/A 08/01/2014    Procedure: OPERATIVE ULTRASOUND;  Surgeon: Tom Curry, MD;  Location: [facility];  Service: General;  Laterality: N/A;    Denies any headaches, dizziness, double vision, fevers, chills, night sweats, nausea, vomiting, diarrhea, chest pain, heart palpitations, shortness of breath, blood in stool, black tarry stool, urinary pain, urinary burning, urinary frequency, hematuria.    PHYSICAL EXAMINATION  ECOG PERFORMANCE STATUS: 0 - Asymptomatic  Filed Vitals:   11/12/14 0825  BP: 102/77  Pulse: 75  Temp: 98.1 F (36.7 C)  Resp: 16    GENERAL:alert, no distress, well nourished, well developed, comfortable, cooperative and smiling, accompanied by his wife. SKIN: skin color, texture, turgor are normal, no rashes or significant lesions HEAD: Normocephalic, No masses, lesions, tenderness or abnormalities EYES: normal, PERRLA, EOMI, Conjunctiva are pink and non-injected EARS: External ears normal OROPHARYNX:lips, buccal mucosa, and tongue normal and mucous membranes are moist  NECK: supple, trachea midline LYMPH:  No palpable adenopathy in the neck or supraclavicular regions, no axillary adenopathy BREAST:not examined LUNGS: Air to auscultation bilaterally with no wheezing or rhonchi HEART: S1 and S2 audible, regular, no ectopy ABDOMEN:abdomen soft and normal bowel sounds. No palpable liver edge. BACK: Back symmetric, no curvature. EXTREMITIES:less then 2 second capillary refill, no joint deformities, effusion, or inflammation, no cyanosis  NEURO: alert & oriented x 3 with fluent speech, no focal motor/sensory deficits, gait normal   LABORATORY DATA: CBC    Component Value Date/Time   WBC 4.6 11/12/2014 0832   RBC 4.36 11/12/2014 0832   RBC 3.42* 07/28/2014 1440   HGB 12.1* 11/12/2014  0832   HCT 36.6* 11/12/2014 0832   PLT 110* 11/12/2014 0832   MCV 83.9 11/12/2014 0832   MCH 27.8 11/12/2014 0832   MCHC 33.1 11/12/2014 0832   RDW 23.4* 11/12/2014 0832   LYMPHSABS 1.2 11/12/2014 0832   MONOABS 0.7 11/12/2014 0832   EOSABS 0.2 11/12/2014 0832   BASOSABS 0.0 11/12/2014 0832      Chemistry      Component Value Date/Time   NA 140 11/12/2014 0832   K 4.0 11/12/2014 0832   CL 109 11/12/2014 0832   CO2 25 11/12/2014 0832   BUN 16 11/12/2014 0832   CREATININE 0.76 11/12/2014 0832      Component Value Date/Time   CALCIUM 8.7* 11/12/2014 0832   ALKPHOS 113 11/12/2014 0832   AST 39 11/12/2014 0832   ALT 26 11/12/2014 0832   BILITOT 0.4 11/12/2014 0832     Lab Results  Component Value Date   CEA 3.9 10/30/2014    Current outpatient prescriptions:  .  b complex vitamins tablet, Take 1 tablet by mouth daily., Disp: , Rfl:  .  docusate sodium (COLACE) 100 MG capsule, Take 1 capsule (100 mg total) by mouth 2 (two) times daily. (Patient taking differently: Take 100 mg by mouth daily. 3 in the am and 2 at night), Disp: 60 capsule, Rfl: 0 .  ferrous sulfate 325 (65 FE) MG tablet, Take 1 tablet (325 mg total) by  mouth 2 (two) times daily., Disp: 60 tablet, Rfl: 0 .  ibuprofen (ADVIL,MOTRIN) 200 MG tablet, Take 600 mg by mouth every 6 (six) hours as needed for mild pain. , Disp: , Rfl:  .  leucovorin in dextrose 5 % 250 mL, Inject into the vein once. Every 14 days. To begin Monday 08/06/14, Disp: , Rfl:  .  lidocaine-prilocaine (EMLA) cream, Apply a quarter size amount to port site 1 hour prior to chemo. Do not rub in. Cover with plastic wrap., Disp: 30 g, Rfl: 3 .  Multiple Vitamin (MULTIVITAMIN WITH MINERALS) TABS tablet, Take 1 tablet by mouth daily., Disp: , Rfl:  .  ondansetron (ZOFRAN) 8 MG tablet, Take 1 tablet (8 mg total) by mouth every 8 (eight) hours as needed for nausea or vomiting., Disp: 30 tablet, Rfl: 3 .  oxyCODONE-acetaminophen (PERCOCET/ROXICET) 5-325 MG  per tablet, Take 1-2 tablets by mouth every 4 (four) hours as needed for moderate pain., Disp: 30 tablet, Rfl: 0 .  polyethylene glycol (MIRALAX / GLYCOLAX) packet, Take 17 g by mouth daily as needed for mild constipation., Disp: 14 each, Rfl: 0 .  ranitidine (ZANTAC) 150 MG capsule, Take 150 mg by mouth 2 (two) times daily., Disp: , Rfl:  .  sodium chloride 0.9 % SOLN 150 mL with fluorouracil 5 GM/100ML SOLN 200 mg/m2/day, Inject 200 mg/m2/day into the vein. Every 14 days. To begin Monday 08/06/14. To infuse over 46 hours., Disp: , Rfl:  .  vitamin C (ASCORBIC ACID) 500 MG tablet, Take 500 mg by mouth daily., Disp: , Rfl:  .  OXALIPLATIN IV, Inject into the vein. Every 14 days. To begin Monday 08/06/14, Disp: , Rfl:  .  prochlorperazine (COMPAZINE) 10 MG tablet, Take 1 tablet (10 mg total) by mouth every 6 (six) hours as needed for nausea or vomiting. (Patient not taking: Reported on 11/12/2014), Disp: 30 tablet, Rfl: 3 No current facility-administered medications for this visit.  Facility-Administered Medications Ordered in Other Visits:  .  fluorouracil (ADRUCIL) 4,750 mg in sodium chloride 0.9 % 55 mL chemo infusion, 2,400 mg/m2 (Treatment Plan Actual), Intravenous, 1 day or 1 dose, Patrici Ranks, MD, 4,750 mg at 11/12/14 1235 .  heparin lock flush 100 unit/mL, 500 Units, Intracatheter, Once PRN, Patrici Ranks, MD .  sodium chloride 0.9 % injection 10 mL, 10 mL, Intravenous, PRN, Patrici Ranks, MD, 10 mL at 08/08/14 1237 .  sodium chloride 0.9 % injection 10 mL, 10 mL, Intracatheter, PRN, Patrici Ranks, MD    ASSESSMENT AND PLAN:  Stage IV colon cancer Cold-induced neuropathy  49 year old male with stage IV colon cancer currently on FOLFOX. Clinically he is improved.His weight is up.  He is tolerating treatment without significant difficulty. I have held his AVASTIN secondary to proteinuria. Repeat 24 hour protein is currently pending. He has no baseline neuropathy, only cold  induced. I have advised him to notify us if it persists up to his next treatment. I have again encouraged him to let us know if he has any difficulties prior to his next follow-up in 2 weeks.   Anemia Iron deficiency  His anemia is improving, he is taking oral iron with good tolerance. Last ferritin was 94 ng/ml. We will consider discontinuing his oral iron moving forward.  Genetics Counseling  He has completed genetics counseling and results are pending.   THERAPY PLAN:  Continue therapy a planned   All questions were answered. The patient knows to call the clinic with any problems,  questions or concerns. We can certainly see the patient much sooner if necessary. //  This document serves as a record of services personally performed by Ancil Linsey, MD. It was created on her behalf by Janace Hoard, a trained medical scribe. The creation of this record is based on the scribe's personal observations and the provider's statements to them. This document has been checked and approved by the attending provider.  I have reviewed the above documentation for accuracy and completeness, and I agree with the above.  Kelby Fam. Whitney Muse, MD

## 2014-11-12 NOTE — Progress Notes (Signed)
Tom [name] Tolerated chemotherapy well today Discharged ambulatory

## 2014-11-14 ENCOUNTER — Encounter (HOSPITAL_COMMUNITY): Payer: Self-pay

## 2014-11-14 ENCOUNTER — Encounter (HOSPITAL_BASED_OUTPATIENT_CLINIC_OR_DEPARTMENT_OTHER): Payer: 59

## 2014-11-14 VITALS — BP 92/63 | HR 71 | Temp 98.8°F | Resp 18

## 2014-11-14 DIAGNOSIS — C189 Malignant neoplasm of colon, unspecified: Secondary | ICD-10-CM

## 2014-11-14 DIAGNOSIS — C187 Malignant neoplasm of sigmoid colon: Secondary | ICD-10-CM

## 2014-11-14 DIAGNOSIS — C7802 Secondary malignant neoplasm of left lung: Secondary | ICD-10-CM | POA: Diagnosis not present

## 2014-11-14 DIAGNOSIS — C7801 Secondary malignant neoplasm of right lung: Secondary | ICD-10-CM | POA: Diagnosis not present

## 2014-11-14 DIAGNOSIS — Z452 Encounter for adjustment and management of vascular access device: Secondary | ICD-10-CM | POA: Diagnosis not present

## 2014-11-14 DIAGNOSIS — C787 Secondary malignant neoplasm of liver and intrahepatic bile duct: Secondary | ICD-10-CM

## 2014-11-14 MED ORDER — HEPARIN SOD (PORK) LOCK FLUSH 100 UNIT/ML IV SOLN
INTRAVENOUS | Status: AC
  Filled 2014-11-14: qty 5

## 2014-11-14 MED ORDER — HEPARIN SOD (PORK) LOCK FLUSH 100 UNIT/ML IV SOLN
500.0000 [IU] | Freq: Once | INTRAVENOUS | Status: AC | PRN
  Administered 2014-11-14: 500 [IU]

## 2014-11-14 MED ORDER — SODIUM CHLORIDE 0.9 % IJ SOLN
10.0000 mL | INTRAMUSCULAR | Status: DC | PRN
  Administered 2014-11-14: 10 mL
  Filled 2014-11-14: qty 10

## 2014-11-14 NOTE — Patient Instructions (Signed)
[place] at [facility] Discharge Instructions  RECOMMENDATIONS MADE BY THE CONSULTANT AND ANY TEST RESULTS WILL BE SENT TO YOUR REFERRING PHYSICIAN.  Pump discontinued and port flushed today. Return as scheduled for chemotherapy and office visit.   Thank you for choosing [place] at [facility] to provide your oncology and hematology care.  To afford each patient quality time with our provider, please arrive at least 15 minutes before your scheduled appointment time.    You need to re-schedule your appointment should you arrive 10 or more minutes late.  We strive to give you quality time with our providers, and arriving late affects you and other patients whose appointments are after yours.  Also, if you no show three or more times for appointments you may be dismissed from the clinic at the providers discretion.     Again, thank you for choosing [facility].  Our hope is that these requests will decrease the amount of time that you wait before being seen by our physicians.       _____________________________________________________________  Should you have questions after your visit to [facility], please contact our office at [phone] between the hours of 8:30 a.m. and 4:30 p.m.  Voicemails left after 4:30 p.m. will not be returned until the following business day.  For prescription refill requests, have your pharmacy contact our office.

## 2014-11-21 ENCOUNTER — Encounter: Payer: Self-pay | Admitting: Genetic Counselor

## 2014-11-21 ENCOUNTER — Telehealth: Payer: Self-pay | Admitting: Genetic Counselor

## 2014-11-21 DIAGNOSIS — C787 Secondary malignant neoplasm of liver and intrahepatic bile duct: Secondary | ICD-10-CM

## 2014-11-21 DIAGNOSIS — Z1379 Encounter for other screening for genetic and chromosomal anomalies: Secondary | ICD-10-CM | POA: Insufficient documentation

## 2014-11-21 DIAGNOSIS — C189 Malignant neoplasm of colon, unspecified: Secondary | ICD-10-CM

## 2014-11-21 NOTE — Telephone Encounter (Signed)
Called Mr. Flagg and let him know that his genetic test results were normal--i.e. testing found no meaningful changes to any of 19 genes associated with increased risks for colorectal and other types of cancers.  His own cancer screening is then based upon the personal and family history of cancer.  His siblings should all be getting colonoscopies since they are over the age of 63 (based upon Mr. Agyeman diagnosis of colon cancer at 15).  Currently, his children would start colonoscopies at the age of 51 as well.  However, they are still young and screening guidelines may change by that time.  I encouraged Mr. Gaulin to call me with any further questions.

## 2014-11-21 NOTE — Progress Notes (Signed)
GENETIC TEST RESULTS  HPI: [name] was previously seen in the [place] clinic due to a personal history of colon cancer at 63, family history of colon and other unspecified cancers, and concerns regarding a hereditary predisposition to cancer. Please refer to our prior cancer genetics clinic note for more information regarding [name]'s medical, social and family histories, and our assessment and recommendations, at the time. [name] recent genetic test results were disclosed to him, as were recommendations warranted by these results. These results and recommendations are discussed in more detail below.  GENETIC TEST RESULTS: At the time of [name] visit on Oct 30, 2014 at [facility], we recommended he pursue genetic testing of the 19-gene Colorectal Cancer Panel through [facility] Tom Pigeon, MD).  The Colorectal Cancer Panel offered by [facility] included next-generation sequencing and duplication/deletion testing of the following 17 genes: APC, ATM, AXIN2, BMPR1A, CDH1, CHEK2, MLH1, MSH2, MSH6, MUTYH, PMS2, POLD1, POLE, PTEN, SMAD4, STK11, and TP53.  Additionally, the Colorectal Cancer Panel assessed deletion/duplication testing (without sequencing) for two genes, EPCAM and SCG5/GREM1.  The report date of this test was November 20, 2014.  Genetic testing was normal, and did not reveal a deleterious mutation in these genes. The test report will be scanned into EPIC and will be located under the Media tab.   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 At this time, it is unknown if this VUS is associated with an increased risk for cancer or if this is a normal finding. Since this VUS result is uncertain, it cannot help guide screening recommendations, and family members should not be tested for this VUS to help define their own cancer risks.  Also, we all have variants within our genes  that make [name] unique individuals--most of these variants are benign.  Thus, we treat this VUS as a negative result.   With time, we suspect the lab will reclassify this variant and when they do, we will try to re-contact [name] to discuss the reclassification further.    We discussed with [name] that since the current genetic testing is not perfect, it is possible there may be a gene mutation in one of these genes that current testing cannot detect, but that chance is small. We also discussed, that it is possible that another gene that has not yet been discovered, or that we have not yet tested, is responsible for the cancer diagnoses in the family, and it is, therefore, important to remain in touch with cancer genetics in the future so that we can continue to offer [name] the most up to date genetic testing.   CANCER SCREENING RECOMMENDATIONS: This result is reassuring and indicates that [name] likely does not have an increased risk for a future cancer due to a mutation in one of these genes. This normal test also suggests that [name] colon cancer was most likely not due to an inherited predisposition associated with one of these genes.  Most cancers happen by chance and this negative test suggests that his cancer falls into this category.  We, therefore, recommended he continue to follow the cancer management and screening guidelines provided by his oncology and primary healthcare provider.   RECOMMENDATIONS FOR FAMILY MEMBERS:  We discussed that based on [name] colon cancer diagnosis at 66, and since they are over the age of 41, [name] siblings should all be having colonoscopies.  They  should be getting colonoscopies every 5 years unless a positive result requires colonoscopies more often.  [name] reported that most, if not all, of his siblings have had colonoscopies.  Based on current guidelines, [name] children would need to start colonoscopies  at the age of 87 as well.  However, they are still fairly young and guidelines might change by that time.  FOLLOW-UP: Lastly, we discussed with [name] that cancer genetics is a rapidly advancing field and it is possible that new genetic tests will be appropriate for him and/or his family members in the future. We encouraged him to remain in contact with cancer genetics on an annual basis so we can update his personal and family histories and let him know of advances in cancer genetics that may benefit this family.   Our contact number was provided. [name] questions were answered to his satisfaction, and he knows he is welcome to call us at anytime with additional questions or concerns.   Jeanine Luz, MS Genetic Counselor Phone: [phone] [email or web address]

## 2014-11-26 ENCOUNTER — Encounter (HOSPITAL_BASED_OUTPATIENT_CLINIC_OR_DEPARTMENT_OTHER): Payer: 59 | Admitting: Oncology

## 2014-11-26 ENCOUNTER — Encounter (HOSPITAL_COMMUNITY): Payer: Self-pay | Admitting: Oncology

## 2014-11-26 ENCOUNTER — Encounter (HOSPITAL_BASED_OUTPATIENT_CLINIC_OR_DEPARTMENT_OTHER): Payer: 59

## 2014-11-26 VITALS — BP 110/62 | HR 62 | Temp 97.8°F | Resp 18 | Wt 181.4 lb

## 2014-11-26 VITALS — BP 122/71 | HR 49 | Temp 98.1°F | Resp 18

## 2014-11-26 DIAGNOSIS — Z5111 Encounter for antineoplastic chemotherapy: Secondary | ICD-10-CM

## 2014-11-26 DIAGNOSIS — R600 Localized edema: Secondary | ICD-10-CM

## 2014-11-26 DIAGNOSIS — C187 Malignant neoplasm of sigmoid colon: Secondary | ICD-10-CM

## 2014-11-26 DIAGNOSIS — C189 Malignant neoplasm of colon, unspecified: Secondary | ICD-10-CM | POA: Diagnosis not present

## 2014-11-26 DIAGNOSIS — C7802 Secondary malignant neoplasm of left lung: Secondary | ICD-10-CM | POA: Diagnosis not present

## 2014-11-26 DIAGNOSIS — C787 Secondary malignant neoplasm of liver and intrahepatic bile duct: Secondary | ICD-10-CM

## 2014-11-26 DIAGNOSIS — C7801 Secondary malignant neoplasm of right lung: Secondary | ICD-10-CM

## 2014-11-26 LAB — CBC WITH DIFFERENTIAL/PLATELET
Basophils Absolute: 0 10*3/uL (ref 0.0–0.1)
Basophils Relative: 0 % (ref 0–1)
Eosinophils Absolute: 0.1 10*3/uL (ref 0.0–0.7)
Eosinophils Relative: 2 % (ref 0–5)
HEMATOCRIT: 35.4 % — AB (ref 39.0–52.0)
Hemoglobin: 12 g/dL — ABNORMAL LOW (ref 13.0–17.0)
Lymphocytes Relative: 36 % (ref 12–46)
Lymphs Abs: 1.1 10*3/uL (ref 0.7–4.0)
MCH: 29.3 pg (ref 26.0–34.0)
MCHC: 33.9 g/dL (ref 30.0–36.0)
MCV: 86.6 fL (ref 78.0–100.0)
MONO ABS: 0.5 10*3/uL (ref 0.1–1.0)
Monocytes Relative: 16 % — ABNORMAL HIGH (ref 3–12)
Neutro Abs: 1.4 10*3/uL — ABNORMAL LOW (ref 1.7–7.7)
Neutrophils Relative %: 45 % (ref 43–77)
Platelets: 111 10*3/uL — ABNORMAL LOW (ref 150–400)
RBC: 4.09 MIL/uL — ABNORMAL LOW (ref 4.22–5.81)
RDW: 20.7 % — AB (ref 11.5–15.5)
WBC: 3 10*3/uL — ABNORMAL LOW (ref 4.0–10.5)

## 2014-11-26 LAB — COMPREHENSIVE METABOLIC PANEL
ALT: 29 U/L (ref 17–63)
AST: 41 U/L (ref 15–41)
Albumin: 2.6 g/dL — ABNORMAL LOW (ref 3.5–5.0)
Alkaline Phosphatase: 118 U/L (ref 38–126)
Anion gap: 6 (ref 5–15)
BUN: 10 mg/dL (ref 6–20)
CALCIUM: 8.4 mg/dL — AB (ref 8.9–10.3)
CO2: 26 mmol/L (ref 22–32)
Chloride: 110 mmol/L (ref 101–111)
Creatinine, Ser: 0.7 mg/dL (ref 0.61–1.24)
Glucose, Bld: 118 mg/dL — ABNORMAL HIGH (ref 65–99)
Potassium: 3.6 mmol/L (ref 3.5–5.1)
SODIUM: 142 mmol/L (ref 135–145)
Total Bilirubin: 0.4 mg/dL (ref 0.3–1.2)
Total Protein: 5.8 g/dL — ABNORMAL LOW (ref 6.5–8.1)

## 2014-11-26 MED ORDER — SODIUM CHLORIDE 0.9 % IV SOLN
2400.0000 mg/m2 | INTRAVENOUS | Status: DC
  Administered 2014-11-26: 4750 mg via INTRAVENOUS
  Filled 2014-11-26: qty 95

## 2014-11-26 MED ORDER — IBUPROFEN 200 MG PO TABS: 600.0000 mg | ORAL_TABLET | Freq: Once | ORAL | Status: DC

## 2014-11-26 MED ORDER — FLUOROURACIL CHEMO INJECTION 2.5 GM/50ML
400.0000 mg/m2 | Freq: Once | INTRAVENOUS | Status: AC
  Administered 2014-11-26: 800 mg via INTRAVENOUS
  Filled 2014-11-26: qty 16

## 2014-11-26 MED ORDER — OXYCODONE-ACETAMINOPHEN 5-325 MG PO TABS: 1.0000 | ORAL_TABLET | ORAL | Status: DC | PRN

## 2014-11-26 MED ORDER — OXALIPLATIN CHEMO INJECTION 100 MG/20ML
63.7500 mg/m2 | Freq: Once | INTRAVENOUS | Status: AC
  Administered 2014-11-26: 125 mg via INTRAVENOUS
  Filled 2014-11-26: qty 25

## 2014-11-26 MED ORDER — ONDANSETRON HCL 40 MG/20ML IJ SOLN
Freq: Once | INTRAMUSCULAR | Status: AC
  Administered 2014-11-26: 8 mg via INTRAVENOUS
  Filled 2014-11-26: qty 4

## 2014-11-26 MED ORDER — SPIRONOLACTONE 50 MG PO TABS: 50.0000 mg | ORAL_TABLET | Freq: Every day | ORAL | Status: DC

## 2014-11-26 MED ORDER — SODIUM CHLORIDE 0.9 % IJ SOLN: 10.0000 mL | INTRAMUSCULAR | Status: DC | PRN

## 2014-11-26 MED ORDER — LEUCOVORIN CALCIUM INJECTION 350 MG
400.0000 mg/m2 | Freq: Once | INTRAVENOUS | Status: AC
  Administered 2014-11-26: 792 mg via INTRAVENOUS
  Filled 2014-11-26: qty 39.6

## 2014-11-26 MED ORDER — DEXTROSE 5 % IV SOLN
Freq: Once | INTRAVENOUS | Status: AC
  Administered 2014-11-26: 11:00:00 via INTRAVENOUS

## 2014-11-26 NOTE — Patient Instructions (Signed)
[facility] Discharge Instructions for Patients Receiving Chemotherapy  Today you received the following chemotherapy agents:  Oxaliplatin, leucovorin and 5FU.  To help prevent nausea and vomiting after your treatment, we encourage you to take your nausea medication as directed.  Exam and discussion today with [name], PA. Chemotherapy today, with oxaliplatin dose reduced due to the numbness in feet. No Avastin infusion today. Collect 24 hour urine. Prescription for Percocet given. Office visits in 2 weeks and 4 weeks.    If you develop nausea and vomiting, or diarrhea that is not controlled by your medication, call the clinic.  The clinic phone number is [phone]. Office hours are Monday-Friday 8:30am-5:00pm.  BELOW ARE SYMPTOMS THAT SHOULD BE REPORTED IMMEDIATELY:  *FEVER GREATER THAN 101.0 F  *CHILLS WITH OR WITHOUT FEVER  NAUSEA AND VOMITING THAT IS NOT CONTROLLED WITH YOUR NAUSEA MEDICATION  *UNUSUAL SHORTNESS OF BREATH  *UNUSUAL BRUISING OR BLEEDING  TENDERNESS IN MOUTH AND THROAT WITH OR WITHOUT PRESENCE OF ULCERS  *URINARY PROBLEMS  *BOWEL PROBLEMS  UNUSUAL RASH Items with * indicate a potential emergency and should be followed up as soon as possible. If you have an emergency after office hours please contact your primary care physician or go to the nearest emergency department.  Please call the clinic during office hours if you have any questions or concerns.   You may also contact the Patient Navigator at [phone] should you have any questions or need assistance in obtaining follow up care. _____________________________________________________________________ Have you asked about our STAR program?    STAR stands for Survivorship Training and Rehabilitation, and this is a nationally recognized cancer care program that focuses on survivorship and rehabilitation.  Cancer and cancer treatments may cause problems, such as, pain, making  you feel tired and keeping you from doing the things that you need or want to do. Cancer rehabilitation can help. Our goal is to reduce these troubling effects and help you have the best quality of life possible.  You may receive a survey from a nurse that asks questions about your current state of health.  Based on the survey results, all eligible patients will be referred to the [facility] program for an evaluation so we can better serve you! A frequently asked questions sheet is available upon request.

## 2014-11-26 NOTE — Progress Notes (Signed)
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 Appearance is most likely to represent diffuse hepatic metastasis   07/30/2014 Initial Diagnosis Colon, biopsy, sigmoid - ADENOCARCINOMA.   07/31/2014 Tumor Marker CEA- 48.0   07/31/2014 Imaging Ct abd/pelvis- concerning for primary colorectal neoplasm in the mid to distal sigmoid colon, with lymphadenopathy in the sigmoid mesocolon, ileocolic mesentery, and retroperitoneum, as well as widespread metastatic disease to the liver   08/01/2014 Imaging CT chest- Pathologic thoracic, right hilar, and infrahilar adenopathy associated with a masslike region of possible consolidation in the right middle lobe, surrounding nodularity, and some scattered bilateral pulmonary nodules.   08/06/2014 -  Chemotherapy FOLFOX.  Avastin added for cycle 2.   10/08/2014 Adverse Reaction Avastin induced proteinuria >/= 2 gm Avastin on hold   10/15/2014 Treatment Plan Change Avastin held for proteinuria.   10/22/2014 Imaging Ct CAP- 1. Interval decrease and soft tissue adjacent to the mid sigmoid colon with decreasing sigmoid colon wall thickening. The  leocolic, mesenteric, perirectal, and mesocolonic lymphadenopathy has decreased in the interval.   10/30/2014 Survivorship Genetic Counseling. Genetic testing was normal, and did not reveal a deleterious mutation in these genes. The test report will be scanned into EPIC and will be located under the Media tab.     I personally reviewed and went over laboratory results with the patient.  The results are noted within this dictation.  He continues to tolerate therapy well.  He continues to notice the anticipated cold intolerance.  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  This has been ongoing x 1 week.  He reports that it is localized to the bottom of his feet.  He notices it when he washes his feet most.    Past Medical History  Diagnosis Date  . 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     has No Known Allergies.  Current Outpatient Prescriptions on File Prior to Visit  Medication Sig Dispense Refill  . b complex vitamins tablet Take 1 tablet by mouth daily.    [name] docusate sodium (COLACE) 100 MG capsule Take 1 capsule (100 mg total) by mouth 2 (two) times daily. (Patient taking differently: Take 100 mg by mouth daily. 3 in the am and 2 at night) 60 capsule 0  . ferrous sulfate 325 (65 FE) MG tablet Take 1 tablet (325 mg total) by mouth 2 (two) times daily. 60 tablet 0  . ibuprofen (ADVIL,MOTRIN) 200 MG tablet Take 600 mg by mouth every 6 (six) hours as needed for mild pain.     [name] leucovorin in dextrose 5 % 250 mL Inject into the vein once. Every 14 days. To begin Monday 08/06/14    .  lidocaine-prilocaine (EMLA) cream Apply a quarter size amount to port site 1 hour prior to chemo. Do not rub in. Cover with plastic wrap. 30 g 3  . Multiple Vitamin (MULTIVITAMIN WITH MINERALS) TABS tablet Take 1 tablet by mouth daily.    . ondansetron (ZOFRAN) 8 MG tablet Take 1 tablet (8 mg total)  by mouth every 8 (eight) hours as needed for nausea or vomiting. 30 tablet 3  . OXALIPLATIN IV Inject into the vein. Every 14 days. To begin Monday 08/06/14    . polyethylene glycol (MIRALAX / GLYCOLAX) packet Take 17 g by mouth daily as needed for mild constipation. 14 each 0  . prochlorperazine (COMPAZINE) 10 MG tablet Take 1 tablet (10 mg total) by mouth every 6 (six) hours as needed for nausea or vomiting. (Patient not taking: Reported on 11/12/2014) 30 tablet 3  . ranitidine (ZANTAC) 150 MG capsule Take 150 mg by mouth 2 (two) times daily.    . sodium chloride 0.9 % SOLN 150 mL with fluorouracil 5 GM/100ML SOLN 200 mg/m2/day Inject 200 mg/m2/day into the vein. Every 14 days. To begin Monday 08/06/14. To infuse over 46 hours.    . vitamin C (ASCORBIC ACID) 500 MG tablet Take 500 mg by mouth daily.     Current Facility-Administered Medications on File Prior to Visit  Medication Dose Route Frequency Provider Last Rate Last Dose  . sodium chloride 0.9 % injection 10 mL  10 mL Intravenous PRN Patrici Ranks, MD   10 mL at 08/08/14 1237    Past Surgical History  Procedure Laterality Date  . Esophagogastroduodenoscopy N/A 07/30/2014    Procedure: ESOPHAGOGASTRODUODENOSCOPY (EGD);  Surgeon: Inda Castle, MD;  Location: [facility];  Service: Endoscopy;  Laterality: N/A;  . Colonoscopy N/A 07/30/2014    Procedure: COLONOSCOPY;  Surgeon: Inda Castle, MD;  Location: [facility];  Service: Endoscopy;  Laterality: N/A;  . Portacath placement Right 08/01/14  . Portacath placement N/A 08/01/2014    Procedure: INSERTION RIGHT IJ PORT-A-CATH WITH ULTRASOUND AND FLUORO;  Surgeon: Gayland Curry, MD;  Location: [facility];  Service: General;  Laterality: N/A;  . Operative ultrasound N/A 08/01/2014    Procedure: OPERATIVE ULTRASOUND;  Surgeon: Gayland Curry, MD;  Location: [facility];  Service: General;  Laterality: N/A;    Denies any headaches, dizziness, double vision, fevers, chills, night sweats, nausea,  vomiting, diarrhea, constipation, chest pain, heart palpitations, shortness of breath, blood in stool, black tarry stool, urinary pain, urinary burning, urinary frequency, hematuria.   PHYSICAL EXAMINATION  ECOG PERFORMANCE STATUS: 1 - Symptomatic but completely ambulatory  Filed Vitals:   11/26/14 0915  BP: 110/62  Pulse: 62  Temp: 97.8 F (36.6 C)  Resp: 18    GENERAL:alert, no distress, well nourished, well developed, comfortable, cooperative, smiling and accompanied by wife SKIN: skin color, texture, turgor are normal, no rashes or significant lesions HEAD: Normocephalic, No masses, lesions, tenderness or abnormalities EYES: normal, PERRLA, EOMI, Conjunctiva are pink and non-injected EARS: External ears normal OROPHARYNX:lips, buccal mucosa, and tongue normal and mucous membranes are moist  NECK: supple, no adenopathy, thyroid normal size, non-tender, without nodularity, no stridor, non-tender, trachea midline LYMPH:  no palpable lymphadenopathy BREAST:not examined LUNGS: clear to auscultation  HEART: regular rate & rhythm, no murmurs and no gallops ABDOMEN:abdomen soft, non-tender and normal bowel sounds BACK: Back symmetric, no curvature., No CVA tenderness EXTREMITIES:less then 2 second capillary refill, no joint deformities, effusion, or inflammation, no skin discoloration, no cyanosis, B/L LE  edema 2+ pitting in ankles and feet.  NEURO: alert & oriented x 3 with fluent speech, no focal motor/sensory deficits, gait normal   LABORATORY DATA: CBC    Component Value Date/Time   WBC 3.0* 11/26/2014 0924   RBC 4.09* 11/26/2014 0924   RBC 3.42* 07/28/2014 1440   HGB 12.0* 11/26/2014 0924   HCT 35.4* 11/26/2014 0924   PLT PENDING 11/26/2014 0924   MCV 86.6 11/26/2014 0924   MCH 29.3 11/26/2014 0924   MCHC 33.9 11/26/2014 0924   RDW 20.7* 11/26/2014 0924   LYMPHSABS 1.1 11/26/2014 0924   MONOABS 0.5 11/26/2014 0924   EOSABS 0.1 11/26/2014 0924   BASOSABS 0.0  11/26/2014 0924      Chemistry      Component Value Date/Time   NA 142 11/26/2014 0924   K 3.6 11/26/2014 0924   CL 110 11/26/2014 0924   CO2 26 11/26/2014 0924   BUN 10 11/26/2014 0924   CREATININE 0.70 11/26/2014 0924      Component Value Date/Time   CALCIUM 8.4* 11/26/2014 0924   ALKPHOS 118 11/26/2014 0924   AST 41 11/26/2014 0924   ALT 29 11/26/2014 0924   BILITOT 0.4 11/26/2014 0924         ASSESSMENT AND PLAN:  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   He is tolerating treatment with expected side effects of chemotherapy, but overall tolerating therapy well.  He notes peripheral neuropathy of his feet which is new x 1 week.  As a result, Oxaliplatin dose is reduced by 25%.  On exam, LE edema was noted.  I have encouraged him to keep his legs elevated when resting.  Otherwise, I will give him a 7 day course of diuretic (Spirinolactone).  Rx escribed.   Pre-chemo labs as planned.  Labs today meet treatment parameters.   ANC of 1.4 is noted.  Oxaliplatin dose reduced today for peripheral neuropathy.   Platelet count is pending at the time of this dictation.  Genetic Counseling is complete and is negative.  Order placed for 24 hour urine collection for proteinuria (Avastin-induced).  Return in 2 weeks for follow-up, pre-chemo labs, and chemotherapy as planned.      THERAPY PLAN:  Continue with treatment as planned.  All questions were answered. The patient knows to call the clinic with any problems, questions or concerns. We can certainly see the patient much sooner if necessary.  Patient and plan discussed with Dr. Ancil Linsey and she is in agreement with the aforementioned.   This note is electronically signed by: Doy Mince 11/26/2014 10:12 AM

## 2014-11-26 NOTE — Progress Notes (Signed)
Discussed counts with T. Kefalas PA-C. Dose reduced oxaliplatin today. Patient is scheduled to collect 24 hr urine collection. Tolerated chemo well.

## 2014-11-26 NOTE — Patient Instructions (Signed)
[facility] Discharge Instructions for Patients Receiving Chemotherapy  Today you received the following chemotherapy agents:  Oxaliplatin, leucovorin and 5FU.  To help prevent nausea and vomiting after your treatment, we encourage you to take your nausea medication as directed.  Exam and discussion today with Tom Crigler, PA. Chemotherapy today, with oxaliplatin dose reduced due to the numbness in feet. No Avastin infusion today. Collect 24 hour urine. Prescription for Percocet given. Office visits in 2 weeks and 4 weeks.    If you develop nausea and vomiting, or diarrhea that is not controlled by your medication, call the clinic.  The clinic phone number is [phone]. Office hours are Monday-Friday 8:30am-5:00pm.  BELOW ARE SYMPTOMS THAT SHOULD BE REPORTED IMMEDIATELY:  *FEVER GREATER THAN 101.0 F  *CHILLS WITH OR WITHOUT FEVER  NAUSEA AND VOMITING THAT IS NOT CONTROLLED WITH YOUR NAUSEA MEDICATION  *UNUSUAL SHORTNESS OF BREATH  *UNUSUAL BRUISING OR BLEEDING  TENDERNESS IN MOUTH AND THROAT WITH OR WITHOUT PRESENCE OF ULCERS  *URINARY PROBLEMS  *BOWEL PROBLEMS  UNUSUAL RASH Items with * indicate a potential emergency and should be followed up as soon as possible. If you have an emergency after office hours please contact your primary care physician or go to the nearest emergency department.  Please call the clinic during office hours if you have any questions or concerns.   You may also contact the Patient Navigator at [phone] should you have any questions or need assistance in obtaining follow up care. _____________________________________________________________________ Have you asked about our STAR program?    STAR stands for Survivorship Training and Rehabilitation, and this is a nationally recognized cancer care program that focuses on survivorship and rehabilitation.  Cancer and cancer treatments may cause problems, such as, pain, making  you feel tired and keeping you from doing the things that you need or want to do. Cancer rehabilitation can help. Our goal is to reduce these troubling effects and help you have the best quality of life possible.  You may receive a survey from a nurse that asks questions about your current state of health.  Based on the survey results, all eligible patients will be referred to the [facility] program for an evaluation so we can better serve you! A frequently asked questions sheet is available upon request.

## 2014-11-26 NOTE — Assessment & Plan Note (Addendum)
49 year old male with stage IV colon cancer currently on FOLFOX + Avastin with positive response on restaging imaging.   He is tolerating treatment with expected side effects of chemotherapy, but overall tolerating therapy well.  He notes peripheral neuropathy of his feet which is new x 1 week.  As a result, Oxaliplatin dose is reduced by 25%.  On exam, LE edema was noted.  I have encouraged him to keep his legs elevated when resting.  Otherwise, I will give him a 7 day course of diuretic (Spirinolactone).  Rx escribed.   Pre-chemo labs as planned.  Labs today meet treatment parameters.   ANC of 1.4 is noted.  Oxaliplatin dose reduced today for peripheral neuropathy.   Platelet count is pending at the time of this dictation.  Genetic Counseling is complete and is negative.  Order placed for 24 hour urine collection for proteinuria (Avastin-induced).  Return in 2 weeks for follow-up, pre-chemo labs, and chemotherapy as planned.

## 2014-11-27 LAB — CEA: CEA: 3.5 ng/mL (ref 0.0–4.7)

## 2014-11-28 ENCOUNTER — Encounter (HOSPITAL_BASED_OUTPATIENT_CLINIC_OR_DEPARTMENT_OTHER): Payer: 59

## 2014-11-28 ENCOUNTER — Encounter (HOSPITAL_COMMUNITY): Payer: Self-pay

## 2014-11-28 VITALS — BP 96/60 | HR 60 | Temp 98.0°F | Resp 18

## 2014-11-28 DIAGNOSIS — C189 Malignant neoplasm of colon, unspecified: Secondary | ICD-10-CM | POA: Diagnosis present

## 2014-11-28 DIAGNOSIS — C787 Secondary malignant neoplasm of liver and intrahepatic bile duct: Secondary | ICD-10-CM

## 2014-11-28 MED ORDER — SODIUM CHLORIDE 0.9 % IJ SOLN
10.0000 mL | INTRAMUSCULAR | Status: DC | PRN
  Administered 2014-11-28: 10 mL
  Filled 2014-11-28: qty 10

## 2014-11-28 MED ORDER — HEPARIN SOD (PORK) LOCK FLUSH 100 UNIT/ML IV SOLN
500.0000 [IU] | Freq: Once | INTRAVENOUS | Status: AC | PRN
  Administered 2014-11-28: 500 [IU]

## 2014-11-28 MED ORDER — HEPARIN SOD (PORK) LOCK FLUSH 100 UNIT/ML IV SOLN
INTRAVENOUS | Status: AC
  Filled 2014-11-28: qty 5

## 2014-11-28 NOTE — Progress Notes (Signed)
Tom [name] presented for pump removal and portacath flush and de-access..  Portacath located right chest wall flushed with 22ml NS and 500U/75ml Heparin and needle removed intact.  Procedure tolerated well and without incident.

## 2014-11-28 NOTE — Patient Instructions (Signed)
[place] at [facility] Discharge Instructions  RECOMMENDATIONS MADE BY THE CONSULTANT AND ANY TEST RESULTS WILL BE SENT TO YOUR REFERRING PHYSICIAN.  Pump removal and port flush today. Return as scheduled for next treatment.  Thank you for choosing [place] at [facility] to provide your oncology and hematology care.  To afford each patient quality time with our provider, please arrive at least 15 minutes before your scheduled appointment time.    You need to re-schedule your appointment should you arrive 10 or more minutes late.  We strive to give you quality time with our providers, and arriving late affects you and other patients whose appointments are after yours.  Also, if you no show three or more times for appointments you may be dismissed from the clinic at the providers discretion.     Again, thank you for choosing [facility].  Our hope is that these requests will decrease the amount of time that you wait before being seen by our physicians.       _____________________________________________________________  Should you have questions after your visit to [facility], please contact our office at [phone] between the hours of 8:30 a.m. and 4:30 p.m.  Voicemails left after 4:30 p.m. will not be returned until the following business day.  For prescription refill requests, have your pharmacy contact our office.

## 2014-12-05 ENCOUNTER — Other Ambulatory Visit (HOSPITAL_COMMUNITY)
Admission: RE | Admit: 2014-12-05 | Discharge: 2014-12-05 | Disposition: A | Discharge status: Home / Self Care | Payer: 59 | Source: Ambulatory Visit | Attending: Family Medicine | Admitting: Family Medicine

## 2014-12-05 DIAGNOSIS — R809 Proteinuria, unspecified: Secondary | ICD-10-CM | POA: Insufficient documentation

## 2014-12-05 LAB — PROTEIN, URINE, 24 HOUR
COLLECTION INTERVAL-UPROT: 24 h
Protein, 24H Urine: 5875 mg/d — ABNORMAL HIGH (ref 50–100)
Protein, Urine: 470 mg/dL
URINE TOTAL VOLUME-UPROT: 1250 mL

## 2014-12-05 NOTE — Addendum Note (Signed)
Addended by: Sherry Ruffing D on: 12/05/2014 03:28 PM   Modules accepted: Orders

## 2014-12-10 ENCOUNTER — Encounter (HOSPITAL_COMMUNITY): Payer: Self-pay | Admitting: Oncology

## 2014-12-10 ENCOUNTER — Encounter (HOSPITAL_COMMUNITY): Payer: Medicaid Other | Attending: Hematology & Oncology

## 2014-12-10 ENCOUNTER — Encounter (HOSPITAL_BASED_OUTPATIENT_CLINIC_OR_DEPARTMENT_OTHER): Payer: Medicaid Other | Admitting: Oncology

## 2014-12-10 ENCOUNTER — Encounter (HOSPITAL_COMMUNITY): Payer: Self-pay | Admitting: Lab

## 2014-12-10 VITALS — BP 112/71 | HR 65 | Temp 97.9°F | Resp 18 | Wt 178.6 lb

## 2014-12-10 VITALS — BP 105/59 | HR 60 | Temp 98.4°F | Resp 18

## 2014-12-10 DIAGNOSIS — C787 Secondary malignant neoplasm of liver and intrahepatic bile duct: Secondary | ICD-10-CM

## 2014-12-10 DIAGNOSIS — R809 Proteinuria, unspecified: Secondary | ICD-10-CM

## 2014-12-10 DIAGNOSIS — C189 Malignant neoplasm of colon, unspecified: Secondary | ICD-10-CM

## 2014-12-10 DIAGNOSIS — C187 Malignant neoplasm of sigmoid colon: Secondary | ICD-10-CM | POA: Insufficient documentation

## 2014-12-10 DIAGNOSIS — C7801 Secondary malignant neoplasm of right lung: Secondary | ICD-10-CM | POA: Diagnosis not present

## 2014-12-10 DIAGNOSIS — G622 Polyneuropathy due to other toxic agents: Secondary | ICD-10-CM | POA: Diagnosis not present

## 2014-12-10 DIAGNOSIS — Z5111 Encounter for antineoplastic chemotherapy: Secondary | ICD-10-CM

## 2014-12-10 DIAGNOSIS — C7802 Secondary malignant neoplasm of left lung: Secondary | ICD-10-CM

## 2014-12-10 LAB — COMPREHENSIVE METABOLIC PANEL
ALBUMIN: 2.6 g/dL — AB (ref 3.5–5.0)
ALK PHOS: 124 U/L (ref 38–126)
ALT: 22 U/L (ref 17–63)
AST: 33 U/L (ref 15–41)
Anion gap: 7 (ref 5–15)
BUN: 14 mg/dL (ref 6–20)
CHLORIDE: 106 mmol/L (ref 101–111)
CO2: 24 mmol/L (ref 22–32)
CREATININE: 0.82 mg/dL (ref 0.61–1.24)
Calcium: 8.6 mg/dL — ABNORMAL LOW (ref 8.9–10.3)
Glucose, Bld: 132 mg/dL — ABNORMAL HIGH (ref 65–99)
POTASSIUM: 3.8 mmol/L (ref 3.5–5.1)
Sodium: 137 mmol/L (ref 135–145)
Total Bilirubin: 0.5 mg/dL (ref 0.3–1.2)
Total Protein: 6.3 g/dL — ABNORMAL LOW (ref 6.5–8.1)

## 2014-12-10 LAB — CBC WITH DIFFERENTIAL/PLATELET
BASOS PCT: 0 % (ref 0–1)
Basophils Absolute: 0 10*3/uL (ref 0.0–0.1)
Eosinophils Absolute: 0.1 10*3/uL (ref 0.0–0.7)
Eosinophils Relative: 2 % (ref 0–5)
HEMATOCRIT: 36.7 % — AB (ref 39.0–52.0)
Hemoglobin: 12.3 g/dL — ABNORMAL LOW (ref 13.0–17.0)
Lymphocytes Relative: 29 % (ref 12–46)
Lymphs Abs: 1.2 10*3/uL (ref 0.7–4.0)
MCH: 29.5 pg (ref 26.0–34.0)
MCHC: 33.5 g/dL (ref 30.0–36.0)
MCV: 88 fL (ref 78.0–100.0)
Monocytes Absolute: 0.6 10*3/uL (ref 0.1–1.0)
Monocytes Relative: 16 % — ABNORMAL HIGH (ref 3–12)
NEUTROS PCT: 53 % (ref 43–77)
Neutro Abs: 2.2 10*3/uL (ref 1.7–7.7)
PLATELETS: 112 10*3/uL — AB (ref 150–400)
RBC: 4.17 MIL/uL — ABNORMAL LOW (ref 4.22–5.81)
RDW: 18.2 % — ABNORMAL HIGH (ref 11.5–15.5)
Smear Review: DECREASED
WBC: 4.1 10*3/uL (ref 4.0–10.5)

## 2014-12-10 MED ORDER — HEPARIN SOD (PORK) LOCK FLUSH 100 UNIT/ML IV SOLN
500.0000 [IU] | Freq: Once | INTRAVENOUS | Status: DC | PRN
Start: 2014-12-10 — End: 2014-12-10

## 2014-12-10 MED ORDER — SODIUM CHLORIDE 0.9 % IJ SOLN: 10.0000 mL | INTRAMUSCULAR | Status: DC | PRN

## 2014-12-10 MED ORDER — OXALIPLATIN CHEMO INJECTION 100 MG/20ML
63.7500 mg/m2 | Freq: Once | INTRAVENOUS | Status: AC
  Administered 2014-12-10: 125 mg via INTRAVENOUS
  Filled 2014-12-10: qty 25

## 2014-12-10 MED ORDER — SODIUM CHLORIDE 0.9 % IV SOLN
2400.0000 mg/m2 | INTRAVENOUS | Status: DC
  Administered 2014-12-10: 4750 mg via INTRAVENOUS
  Filled 2014-12-10: qty 95

## 2014-12-10 MED ORDER — DEXTROSE 5 % IV SOLN
400.0000 mg/m2 | Freq: Once | INTRAVENOUS | Status: AC
  Administered 2014-12-10: 792 mg via INTRAVENOUS
  Filled 2014-12-10: qty 39.6

## 2014-12-10 MED ORDER — FLUOROURACIL CHEMO INJECTION 2.5 GM/50ML
400.0000 mg/m2 | Freq: Once | INTRAVENOUS | Status: AC
  Administered 2014-12-10: 800 mg via INTRAVENOUS
  Filled 2014-12-10: qty 16

## 2014-12-10 MED ORDER — SODIUM CHLORIDE 0.9 % IV SOLN
Freq: Once | INTRAVENOUS | Status: AC
  Administered 2014-12-10: 8 mg via INTRAVENOUS
  Filled 2014-12-10: qty 4

## 2014-12-10 MED ORDER — DEXTROSE 5 % IV SOLN
Freq: Once | INTRAVENOUS | Status: AC
  Administered 2014-12-10: 11:00:00 via INTRAVENOUS

## 2014-12-10 NOTE — Progress Notes (Signed)
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 Appearance is most likely to represent diffuse hepatic metastasis   07/30/2014 Initial Diagnosis Colon, biopsy, sigmoid - ADENOCARCINOMA.   07/31/2014 Tumor Marker CEA- 48.0   07/31/2014 Imaging Ct abd/pelvis- concerning for primary colorectal neoplasm in the mid to distal sigmoid colon, with lymphadenopathy in the sigmoid mesocolon, ileocolic mesentery, and retroperitoneum, as well as widespread metastatic disease to the liver   08/01/2014 Imaging CT chest- Pathologic thoracic, right hilar, and infrahilar adenopathy associated with a masslike region of possible consolidation in the right middle lobe, surrounding nodularity, and some scattered bilateral pulmonary nodules.   08/06/2014 -  Chemotherapy FOLFOX.  Avastin added for cycle 2.   10/08/2014 Adverse Reaction Avastin induced proteinuria >/= 2 gm Avastin on hold   10/15/2014 Treatment Plan Change Avastin held for proteinuria.   10/22/2014 Imaging Ct CAP- 1. Interval decrease and soft tissue adjacent to the mid sigmoid colon with decreasing sigmoid colon wall thickening. The  leocolic, mesenteric, perirectal, and mesocolonic lymphadenopathy has decreased in the interval.   10/30/2014 Survivorship Genetic Counseling. Genetic testing was normal, and did not reveal a deleterious mutation in these genes. The test report will be scanned into EPIC  and will be located under the Media tab.    11/26/2014 Treatment Plan Change Oxaliplatin reduced x 25% due to PN.    I personally reviewed and went over laboratory results with the patient.  The results are noted within this dictation.  His proteinuria remains and is 5 g on last 24 hour urine collection.  If this is Avastin-induced, we would expect this to have resolved by now since his last dose was on 10/01/2014.  He notes his peripheral neuropathy is stable.  He notes that the bottom of his feet feel like they are asleep.  This is not interfering with his ability to ambulate. He denies any falls.    Past Medical History  Diagnosis Date  . 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     has No Known Allergies.  Current Outpatient Prescriptions on File Prior to Visit  Medication Sig Dispense Refill  . b complex vitamins tablet Take 1 tablet by mouth daily.    [name] docusate sodium (COLACE) 100 MG capsule Take 1 capsule (100 mg total) by mouth 2 (two) times daily. (Patient taking differently: Take 100 mg by mouth daily. 3 in the am and 2 at night) 60 capsule 0  . ferrous sulfate 325 (65 FE) MG tablet Take 1 tablet (325 mg total) by mouth 2 (two) times daily. 60 tablet 0  . ibuprofen (ADVIL,MOTRIN) 200 MG tablet Take 600 mg by mouth every 6 (six) hours as needed for mild pain.     [name] leucovorin in dextrose 5 % 250 mL Inject into the vein once. Every  14 days. To begin Monday 08/06/14    . lidocaine-prilocaine (EMLA) cream Apply a quarter size amount to port site 1 hour prior to chemo. Do not rub in. Cover with plastic wrap. 30 g 3  . Multiple Vitamin (MULTIVITAMIN WITH MINERALS) TABS tablet Take 1 tablet by mouth daily.    . ondansetron (ZOFRAN) 8 MG tablet Take 1 tablet (8 mg total) by mouth every 8  (eight) hours as needed for nausea or vomiting. 30 tablet 3  . OXALIPLATIN IV Inject into the vein. Every 14 days. To begin Monday 08/06/14    . oxyCODONE-acetaminophen (PERCOCET/ROXICET) 5-325 MG per tablet Take 1-2 tablets by mouth every 4 (four) hours as needed for moderate pain. 30 tablet 0  . polyethylene glycol (MIRALAX / GLYCOLAX) packet Take 17 g by mouth daily as needed for mild constipation. 14 each 0  . prochlorperazine (COMPAZINE) 10 MG tablet Take 1 tablet (10 mg total) by mouth every 6 (six) hours as needed for nausea or vomiting. (Patient not taking: Reported on 11/12/2014) 30 tablet 3  . ranitidine (ZANTAC) 150 MG capsule Take 150 mg by mouth 2 (two) times daily.    . sodium chloride 0.9 % SOLN 150 mL with fluorouracil 5 GM/100ML SOLN 200 mg/m2/day Inject 200 mg/m2/day into the vein. Every 14 days. To begin Monday 08/06/14. To infuse over 46 hours.    [name] spironolactone (ALDACTONE) 50 MG tablet Take 1 tablet (50 mg total) by mouth daily. 10 tablet 0  . vitamin C (ASCORBIC ACID) 500 MG tablet Take 500 mg by mouth daily.     Current Facility-Administered Medications on File Prior to Visit  Medication Dose Route Frequency Provider Last Rate Last Dose  . dextrose 5 % solution   Intravenous Once Patrici Ranks, MD      . fluorouracil (ADRUCIL) 4,750 mg in sodium chloride 0.9 % 55 mL chemo infusion  2,400 mg/m2 (Treatment Plan Actual) Intravenous 1 day or 1 dose Patrici Ranks, MD      . fluorouracil (ADRUCIL) chemo injection 800 mg  400 mg/m2 (Treatment Plan Actual) Intravenous Once Patrici Ranks, MD      . heparin lock flush 100 unit/mL  500 Units Intracatheter Once PRN Patrici Ranks, MD      . leucovorin 792 mg in dextrose 5 % 250 mL infusion  400 mg/m2 (Treatment Plan Actual) Intravenous Once Patrici Ranks, MD      . ondansetron (ZOFRAN) 8 mg, dexamethasone (DECADRON) 10 mg in sodium chloride 0.9 % 50 mL IVPB   Intravenous Once Patrici Ranks, MD      . oxaliplatin  (ELOXATIN) 125 mg in dextrose 5 % 500 mL chemo infusion  63.75 mg/m2 (Treatment Plan Actual) Intravenous Once Patrici Ranks, MD      . sodium chloride 0.9 % injection 10 mL  10 mL Intravenous PRN Patrici Ranks, MD   10 mL at 08/08/14 1237  . sodium chloride 0.9 % injection 10 mL  10 mL Intracatheter PRN Patrici Ranks, MD        Past Surgical History  Procedure Laterality Date  . Esophagogastroduodenoscopy N/A 07/30/2014    Procedure: ESOPHAGOGASTRODUODENOSCOPY (EGD);  Surgeon: Inda Castle, MD;  Location: [facility];  Service: Endoscopy;  Laterality: N/A;  . Colonoscopy N/A 07/30/2014    Procedure: COLONOSCOPY;  Surgeon: Inda Castle, MD;  Location: [facility];  Service: Endoscopy;  Laterality: N/A;  . Portacath placement Right 08/01/14  . Portacath placement N/A 08/01/2014  Procedure: INSERTION RIGHT IJ PORT-A-CATH WITH ULTRASOUND AND FLUORO;  Surgeon: Gayland Curry, MD;  Location: [facility];  Service: General;  Laterality: N/A;  . Operative ultrasound N/A 08/01/2014    Procedure: OPERATIVE ULTRASOUND;  Surgeon: Gayland Curry, MD;  Location: [facility];  Service: General;  Laterality: N/A;    Denies any headaches, dizziness, double vision, fevers, chills, night sweats, nausea, vomiting, diarrhea, constipation, chest pain, heart palpitations, shortness of breath, blood in stool, black tarry stool, urinary pain, urinary burning, urinary frequency, hematuria.   PHYSICAL EXAMINATION  ECOG PERFORMANCE STATUS: 1 - Symptomatic but completely ambulatory  Filed Vitals:   12/10/14 0924  BP: 112/71  Pulse: 65  Temp: 97.9 F (36.6 C)  Resp: 18    GENERAL:alert, no distress, well nourished, well developed, comfortable, cooperative, smiling and accompanied by wife SKIN: skin color, texture, turgor are normal, no rashes or significant lesions HEAD: Normocephalic, No masses, lesions, tenderness or abnormalities EYES: normal, PERRLA, EOMI, Conjunctiva are pink and non-injected EARS:  External ears normal OROPHARYNX:lips, buccal mucosa, and tongue normal and mucous membranes are moist  NECK: supple, no adenopathy, thyroid normal size, non-tender, without nodularity, no stridor, non-tender, trachea midline LYMPH:  no palpable lymphadenopathy BREAST:not examined LUNGS: clear to auscultation  HEART: regular rate & rhythm, no murmurs and no gallops ABDOMEN:abdomen soft, non-tender and normal bowel sounds BACK: Back symmetric, no curvature., No CVA tenderness EXTREMITIES:less then 2 second capillary refill, no joint deformities, effusion, or inflammation, no skin discoloration, no cyanosis, B/L LE edema 2+ pitting in ankles and feet.  NEURO: alert & oriented x 3 with fluent speech, no focal motor/sensory deficits, gait normal   LABORATORY DATA: CBC    Component Value Date/Time   WBC 4.1 12/10/2014 0915   RBC 4.17* 12/10/2014 0915   RBC 3.42* 07/28/2014 1440   HGB 12.3* 12/10/2014 0915   HCT 36.7* 12/10/2014 0915   PLT 112* 12/10/2014 0915   MCV 88.0 12/10/2014 0915   MCH 29.5 12/10/2014 0915   MCHC 33.5 12/10/2014 0915   RDW 18.2* 12/10/2014 0915   LYMPHSABS 1.2 12/10/2014 0915   MONOABS 0.6 12/10/2014 0915   EOSABS 0.1 12/10/2014 0915   BASOSABS 0.0 12/10/2014 0915      Chemistry      Component Value Date/Time   NA 137 12/10/2014 0915   K 3.8 12/10/2014 0915   CL 106 12/10/2014 0915   CO2 24 12/10/2014 0915   BUN 14 12/10/2014 0915   CREATININE 0.82 12/10/2014 0915      Component Value Date/Time   CALCIUM 8.6* 12/10/2014 0915   ALKPHOS 124 12/10/2014 0915   AST 33 12/10/2014 0915   ALT 22 12/10/2014 0915   BILITOT 0.5 12/10/2014 0915     Lab Results  Component Value Date   CEA 3.5 11/26/2014      ASSESSMENT AND PLAN:  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   Oxaliplatin dose reduced by 25% for cycle #9 due to  peripheral neuropathy.   Genetic Counseling is complete and is negative.  He is free to have dental appointment, but we request that he keep [name] in the loop regarding his dental plan so we can adjust treatment as needed.    His proteinuria remains elevated.  If this was Avastin-induced, we would expect this to resolve by now since his last does was on 10/01/2014.  We will refer to Nephrology  for work-up and further evaluation of this issue as its etiology is unknown at this time.  Return in 2 weeks for follow-up, pre-chemo labs, and chemotherapy as planned.      THERAPY PLAN:  Continue with treatment as planned.  All questions were answered. The patient knows to call the clinic with any problems, questions or concerns. We can certainly see the patient much sooner if necessary.  Patient and plan discussed with Dr. Ancil Linsey and she is in agreement with the aforementioned.   This note is electronically signed by: Doy Mince 12/10/2014 10:27 AM

## 2014-12-10 NOTE — Assessment & Plan Note (Addendum)
49 year old male with stage IV colon cancer currently on FOLFOX + Avastin with positive response on restaging imaging.   Oxaliplatin dose reduced by 25% for cycle #9 due to peripheral neuropathy.   Genetic Counseling is complete and is negative.  He is free to have dental appointment, but we request that he keep [name] in the loop regarding his dental plan so we can adjust treatment as needed.    His proteinuria remains elevated.  If this was Avastin-induced, we would expect this to resolve by now since his last does was on 10/01/2014.  We will refer to Nephrology for work-up and further evaluation of this issue as its etiology is unknown at this time.  Return in 2 weeks for follow-up, pre-chemo labs, and chemotherapy as planned.

## 2014-12-10 NOTE — Patient Instructions (Signed)
[facility] Discharge Instructions for Patients Receiving Chemotherapy  Today you received the following chemotherapy agents:  Oxaliplatin, leucovorin, and 5FU. To help prevent nausea and vomiting after your treatment, we encourage you to take your nausea medication as prescribed. If you develop nausea and vomiting, or diarrhea that is not controlled by your medication, call the clinic.  Exam and discussion today with [name], PA-C. Hold Avastin today. Referral to nephrology (kidney doctor) for protein in your urine. Return in 2 weeks for chemotherapy and office visit with Dr. Whitney Muse.   The clinic phone number is [phone]. Office hours are Monday-Friday 8:30am-5:00pm.  BELOW ARE SYMPTOMS THAT SHOULD BE REPORTED IMMEDIATELY:  *FEVER GREATER THAN 101.0 F  *CHILLS WITH OR WITHOUT FEVER  NAUSEA AND VOMITING THAT IS NOT CONTROLLED WITH YOUR NAUSEA MEDICATION  *UNUSUAL SHORTNESS OF BREATH  *UNUSUAL BRUISING OR BLEEDING  TENDERNESS IN MOUTH AND THROAT WITH OR WITHOUT PRESENCE OF ULCERS  *URINARY PROBLEMS  *BOWEL PROBLEMS  UNUSUAL RASH Items with * indicate a potential emergency and should be followed up as soon as possible. If you have an emergency after office hours please contact your primary care physician or go to the nearest emergency department.  Please call the clinic during office hours if you have any questions or concerns.   You may also contact the Patient Navigator at [phone] should you have any questions or need assistance in obtaining follow up care. _____________________________________________________________________ Have you asked about our STAR program?    STAR stands for Survivorship Training and Rehabilitation, and this is a nationally recognized cancer care program that focuses on survivorship and rehabilitation.  Cancer and cancer treatments may cause problems, such as, pain, making you feel tired and keeping you from doing the  things that you need or want to do. Cancer rehabilitation can help. Our goal is to reduce these troubling effects and help you have the best quality of life possible.  You may receive a survey from a nurse that asks questions about your current state of health.  Based on the survey results, all eligible patients will be referred to the [facility] program for an evaluation so we can better serve you! A frequently asked questions sheet is available upon request.

## 2014-12-10 NOTE — Patient Instructions (Addendum)
[facility] Discharge Instructions for Patients Receiving Chemotherapy  Today you received the following chemotherapy agents folfox Return as scheduled Please call the clinic if you have any questions or concerns Referral sent to nephrology for proteinuria  To help prevent nausea and vomiting after your treatment, we encourage you to take your nausea medication    If you develop nausea and vomiting, or diarrhea that is not controlled by your medication, call the clinic.  The clinic phone number is [phone]. Office hours are Monday-Friday 8:30am-5:00pm.  BELOW ARE SYMPTOMS THAT SHOULD BE REPORTED IMMEDIATELY:  *FEVER GREATER THAN 101.0 F  *CHILLS WITH OR WITHOUT FEVER  NAUSEA AND VOMITING THAT IS NOT CONTROLLED WITH YOUR NAUSEA MEDICATION  *UNUSUAL SHORTNESS OF BREATH  *UNUSUAL BRUISING OR BLEEDING  TENDERNESS IN MOUTH AND THROAT WITH OR WITHOUT PRESENCE OF ULCERS  *URINARY PROBLEMS  *BOWEL PROBLEMS  UNUSUAL RASH Items with * indicate a potential emergency and should be followed up as soon as possible. If you have an emergency after office hours please contact your primary care physician or go to the nearest emergency department.  Please call the clinic during office hours if you have any questions or concerns.   You may also contact the Patient Navigator at [phone] should you have any questions or need assistance in obtaining follow up care. _____________________________________________________________________ Have you asked about our STAR program?    STAR stands for Survivorship Training and Rehabilitation, and this is a nationally recognized cancer care program that focuses on survivorship and rehabilitation.  Cancer and cancer treatments may cause problems, such as, pain, making you feel tired and keeping you from doing the things that you need or want to do. Cancer rehabilitation can help. Our goal is to reduce these troubling effects and help  you have the best quality of life possible.  You may receive a survey from a nurse that asks questions about your current state of health.  Based on the survey results, all eligible patients will be referred to the [facility] program for an evaluation so we can better serve you! A frequently asked questions sheet is available upon request.

## 2014-12-10 NOTE — Progress Notes (Signed)
Referral sent to Dr Lowanda Foster.   Records faxed on 7/11

## 2014-12-10 NOTE — Progress Notes (Signed)
Tom [name] Tolerated chemotherapy well today Discharged ambulatory

## 2014-12-12 ENCOUNTER — Encounter (HOSPITAL_BASED_OUTPATIENT_CLINIC_OR_DEPARTMENT_OTHER): Payer: Medicaid Other

## 2014-12-12 VITALS — BP 101/58 | HR 88 | Temp 98.0°F | Resp 18

## 2014-12-12 DIAGNOSIS — C189 Malignant neoplasm of colon, unspecified: Secondary | ICD-10-CM

## 2014-12-12 DIAGNOSIS — C7802 Secondary malignant neoplasm of left lung: Secondary | ICD-10-CM

## 2014-12-12 DIAGNOSIS — C7801 Secondary malignant neoplasm of right lung: Secondary | ICD-10-CM | POA: Diagnosis not present

## 2014-12-12 DIAGNOSIS — Z452 Encounter for adjustment and management of vascular access device: Secondary | ICD-10-CM | POA: Diagnosis not present

## 2014-12-12 DIAGNOSIS — C787 Secondary malignant neoplasm of liver and intrahepatic bile duct: Secondary | ICD-10-CM

## 2014-12-12 MED ORDER — HEPARIN SOD (PORK) LOCK FLUSH 100 UNIT/ML IV SOLN
500.0000 [IU] | Freq: Once | INTRAVENOUS | Status: AC | PRN
  Administered 2014-12-12: 500 [IU]

## 2014-12-12 MED ORDER — SODIUM CHLORIDE 0.9 % IJ SOLN
10.0000 mL | INTRAMUSCULAR | Status: DC | PRN
  Administered 2014-12-12: 10 mL
  Filled 2014-12-12: qty 10

## 2014-12-12 MED ORDER — HEPARIN SOD (PORK) LOCK FLUSH 100 UNIT/ML IV SOLN
INTRAVENOUS | Status: AC
  Filled 2014-12-12: qty 5

## 2014-12-12 NOTE — Progress Notes (Signed)
D/C continuous infusion pump. Flushed port per protocol. Patient denies any complaints post chemo.

## 2014-12-12 NOTE — Patient Instructions (Signed)
[place] at [facility] Discharge Instructions  RECOMMENDATIONS MADE BY THE CONSULTANT AND ANY TEST RESULTS WILL BE SENT TO YOUR REFERRING PHYSICIAN.  D/C continuous infusion pump. Report any issues/complaints post chemo to clinic as needed. Return as scheduled.  Thank you for choosing [place] at [facility] to provide your oncology and hematology care.  To afford each patient quality time with our provider, please arrive at least 15 minutes before your scheduled appointment time.    You need to re-schedule your appointment should you arrive 10 or more minutes late.  We strive to give you quality time with our providers, and arriving late affects you and other patients whose appointments are after yours.  Also, if you no show three or more times for appointments you may be dismissed from the clinic at the providers discretion.     Again, thank you for choosing [facility].  Our hope is that these requests will decrease the amount of time that you wait before being seen by our physicians.       _____________________________________________________________  Should you have questions after your visit to [facility], please contact our office at [phone] between the hours of 8:30 a.m. and 4:30 p.m.  Voicemails left after 4:30 p.m. will not be returned until the following business day.  For prescription refill requests, have your pharmacy contact our office.

## 2014-12-24 ENCOUNTER — Encounter (HOSPITAL_BASED_OUTPATIENT_CLINIC_OR_DEPARTMENT_OTHER): Payer: Medicaid Other | Admitting: Hematology & Oncology

## 2014-12-24 ENCOUNTER — Encounter (HOSPITAL_COMMUNITY): Payer: Self-pay | Admitting: Hematology & Oncology

## 2014-12-24 ENCOUNTER — Encounter (HOSPITAL_BASED_OUTPATIENT_CLINIC_OR_DEPARTMENT_OTHER): Payer: Medicaid Other

## 2014-12-24 VITALS — BP 102/77 | HR 62 | Temp 98.1°F | Resp 18 | Wt 176.8 lb

## 2014-12-24 DIAGNOSIS — C189 Malignant neoplasm of colon, unspecified: Secondary | ICD-10-CM | POA: Diagnosis present

## 2014-12-24 DIAGNOSIS — T451X5A Adverse effect of antineoplastic and immunosuppressive drugs, initial encounter: Secondary | ICD-10-CM

## 2014-12-24 DIAGNOSIS — C7801 Secondary malignant neoplasm of right lung: Secondary | ICD-10-CM | POA: Diagnosis not present

## 2014-12-24 DIAGNOSIS — C7802 Secondary malignant neoplasm of left lung: Secondary | ICD-10-CM | POA: Diagnosis not present

## 2014-12-24 DIAGNOSIS — C7800 Secondary malignant neoplasm of unspecified lung: Secondary | ICD-10-CM

## 2014-12-24 DIAGNOSIS — G629 Polyneuropathy, unspecified: Secondary | ICD-10-CM | POA: Diagnosis not present

## 2014-12-24 DIAGNOSIS — R809 Proteinuria, unspecified: Secondary | ICD-10-CM | POA: Diagnosis not present

## 2014-12-24 DIAGNOSIS — C187 Malignant neoplasm of sigmoid colon: Secondary | ICD-10-CM | POA: Diagnosis not present

## 2014-12-24 DIAGNOSIS — C787 Secondary malignant neoplasm of liver and intrahepatic bile duct: Secondary | ICD-10-CM

## 2014-12-24 DIAGNOSIS — D509 Iron deficiency anemia, unspecified: Secondary | ICD-10-CM

## 2014-12-24 DIAGNOSIS — G620 Drug-induced polyneuropathy: Secondary | ICD-10-CM

## 2014-12-24 LAB — COMPREHENSIVE METABOLIC PANEL
ALT: 23 U/L (ref 17–63)
AST: 31 U/L (ref 15–41)
Albumin: 2.6 g/dL — ABNORMAL LOW (ref 3.5–5.0)
Alkaline Phosphatase: 129 U/L — ABNORMAL HIGH (ref 38–126)
Anion gap: 7 (ref 5–15)
BILIRUBIN TOTAL: 0.4 mg/dL (ref 0.3–1.2)
BUN: 13 mg/dL (ref 6–20)
CALCIUM: 8.5 mg/dL — AB (ref 8.9–10.3)
CO2: 25 mmol/L (ref 22–32)
Chloride: 104 mmol/L (ref 101–111)
Creatinine, Ser: 0.79 mg/dL (ref 0.61–1.24)
GFR calc Af Amer: >60 mL/min (ref >60)
GLUCOSE: 119 mg/dL — AB (ref 65–99)
Potassium: 3.8 mmol/L (ref 3.5–5.1)
Sodium: 136 mmol/L (ref 135–145)
TOTAL PROTEIN: 6.3 g/dL — AB (ref 6.5–8.1)

## 2014-12-24 LAB — CBC WITH DIFFERENTIAL/PLATELET
Basophils Absolute: 0 10*3/uL (ref 0.0–0.1)
Basophils Relative: 0 % (ref 0–1)
EOS ABS: 0.1 10*3/uL (ref 0.0–0.7)
EOS PCT: 1 % (ref 0–5)
HEMATOCRIT: 36.4 % — AB (ref 39.0–52.0)
HEMOGLOBIN: 12.3 g/dL — AB (ref 13.0–17.0)
LYMPHS PCT: 25 % (ref 12–46)
Lymphs Abs: 1.1 10*3/uL (ref 0.7–4.0)
MCH: 30.3 pg (ref 26.0–34.0)
MCHC: 33.8 g/dL (ref 30.0–36.0)
MCV: 89.7 fL (ref 78.0–100.0)
MONOS PCT: 18 % — AB (ref 3–12)
Monocytes Absolute: 0.8 10*3/uL (ref 0.1–1.0)
Neutro Abs: 2.5 10*3/uL (ref 1.7–7.7)
Neutrophils Relative %: 56 % (ref 43–77)
Platelets: 132 10*3/uL — ABNORMAL LOW (ref 150–400)
RBC: 4.06 MIL/uL — AB (ref 4.22–5.81)
RDW: 16.8 % — ABNORMAL HIGH (ref 11.5–15.5)
WBC: 4.5 10*3/uL (ref 4.0–10.5)

## 2014-12-24 MED ORDER — HEPARIN SOD (PORK) LOCK FLUSH 100 UNIT/ML IV SOLN
INTRAVENOUS | Status: AC
  Filled 2014-12-24: qty 5

## 2014-12-24 MED ORDER — SODIUM CHLORIDE 0.9 % IJ SOLN
10.0000 mL | INTRAMUSCULAR | Status: DC | PRN
  Administered 2014-12-24: 10 mL via INTRAVENOUS
  Filled 2014-12-24: qty 10

## 2014-12-24 MED ORDER — HEPARIN SOD (PORK) LOCK FLUSH 100 UNIT/ML IV SOLN
500.0000 [IU] | Freq: Once | INTRAVENOUS | Status: AC
  Administered 2014-12-24: 500 [IU] via INTRAVENOUS

## 2014-12-24 NOTE — Progress Notes (Signed)
Tom [name] presented for Portacath access and flush. Proper placement of portacath confirmed by CXR. Portacath located right chest wall accessed with  H 20 needle. Good blood return present. Portacath flushed with 5ml NS and 500U/30ml Heparin and needle removed intact. Procedure without incident. Patient tolerated procedure well.  Patient treatment held today per MD discretion.

## 2014-12-24 NOTE — Progress Notes (Signed)
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 He has no major complaints today.   The patient is present today and has complaints of neuropathy in his hands and feet.  He sates that it has localized from the bottom and outside of his foot up his leg.  He denies tripping or falling and is able to climb a ladder at home.  He is experiencing no neuropathy in his hands today.  He states that he now gets exhausted more quickly but overall, he feels good. Weight is fairly stable.  The patient has no other complaints at this time. He has been referred the nephrology but has not heard about an appointment date and time.     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 Appearance is most likely to represent diffuse hepatic metastasis   07/30/2014 Initial Diagnosis Colon, biopsy, sigmoid - ADENOCARCINOMA.   07/31/2014 Tumor Marker CEA- 48.0   07/31/2014 Imaging Ct abd/pelvis- concerning for primary colorectal neoplasm in the mid to distal sigmoid colon, with lymphadenopathy in the sigmoid mesocolon, ileocolic mesentery, and retroperitoneum, as well as widespread metastatic disease to the liver   08/01/2014 Imaging CT chest- Pathologic thoracic, right hilar, and infrahilar adenopathy associated with a masslike region of possible consolidation in the right middle lobe, surrounding nodularity, and some scattered bilateral pulmonary  nodules.   08/06/2014 -  Chemotherapy FOLFOX.  Avastin added for cycle 2.   10/08/2014 Adverse Reaction Avastin induced proteinuria >/= 2 gm Avastin on hold   10/15/2014 Treatment Plan Change Avastin held for proteinuria.   10/22/2014 Imaging Ct CAP- 1. Interval decrease and soft tissue adjacent to the mid sigmoid colon with decreasing sigmoid colon wall thickening. The  leocolic, mesenteric, perirectal, and mesocolonic lymphadenopathy has decreased in the interval.   10/30/2014 Survivorship Genetic Counseling. Genetic testing was normal, and did not reveal a deleterious mutation in these genes. The test report will be scanned into EPIC and will be located under the Media tab.    11/26/2014 Treatment Plan Change Oxaliplatin reduced x 25% due to PN.    I personally reviewed and went over laboratory results with the patient.  The results are noted within this dictation.  Past Medical History  Diagnosis Date  . 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     has No Known Allergies.  Tom [name] does not currently have medications on file.  Past Surgical History  Procedure Laterality Date  . Esophagogastroduodenoscopy N/A 07/30/2014    Procedure: ESOPHAGOGASTRODUODENOSCOPY (EGD);  Surgeon: Inda Castle, MD;  Location: [facility];  Service: Endoscopy;  Laterality: N/A;  . Colonoscopy N/A 07/30/2014    Procedure: COLONOSCOPY;  Surgeon: Inda Castle, MD;  Location: [facility];  Service: Endoscopy;  Laterality: N/A;  . Portacath  placement Right 08/01/14  . Portacath placement N/A 08/01/2014    Procedure: INSERTION RIGHT IJ PORT-A-CATH WITH ULTRASOUND AND FLUORO;  Surgeon: Gayland Curry, MD;  Location: [facility];  Service: General;  Laterality: N/A;  . Operative ultrasound N/A 08/01/2014    Procedure:  OPERATIVE ULTRASOUND;  Surgeon: Gayland Curry, MD;  Location: [facility];  Service: General;  Laterality: N/A;    Denies any headaches, dizziness, double vision, fevers, chills, night sweats, nausea, vomiting, diarrhea, chest pain, heart palpitations, shortness of breath, blood in stool, black tarry stool, urinary pain, urinary burning, urinary frequency, hematuria. 14 point review of systems was performed and is negative except as detailed under history of present illness and above    PHYSICAL EXAMINATION  ECOG PERFORMANCE STATUS: 0 - Asymptomatic  Filed Vitals:   12/24/14 0844  BP: 102/77  Pulse: 62  Temp: 98.1 F (36.7 C)  Resp: 18    GENERAL:alert, no distress, well nourished, well developed, comfortable, cooperative and smiling, accompanied by his wife. SKIN: skin color, texture, turgor are normal, no rashes or significant lesions HEAD: Normocephalic, No masses, lesions, tenderness or abnormalities EYES: normal, PERRLA, EOMI, Conjunctiva are pink and non-injected EARS: External ears normal OROPHARYNX:lips, buccal mucosa, and tongue normal and mucous membranes are moist  NECK: supple, trachea midline LYMPH:  No palpable adenopathy in the neck or supraclavicular regions, no axillary adenopathy BREAST:not examined LUNGS: Air to auscultation bilaterally with no wheezing or rhonchi HEART: S1 and S2 audible, regular, no ectopy ABDOMEN:abdomen soft and normal bowel sounds. No palpable liver edge. BACK: Back symmetric, no curvature. EXTREMITIES:less then 2 second capillary refill, no joint deformities, effusion, or inflammation, no cyanosis  NEURO: alert & oriented x 3 with fluent speech, no focal motor/sensory deficits, gait normal   LABORATORY DATA: CBC    Component Value Date/Time   WBC 4.5 12/24/2014 0845   RBC 4.06* 12/24/2014 0845   RBC 3.42* 07/28/2014 1440   HGB 12.3* 12/24/2014 0845   HCT 36.4* 12/24/2014 0845   PLT 132* 12/24/2014 0845   MCV 89.7 12/24/2014 0845    MCH 30.3 12/24/2014 0845   MCHC 33.8 12/24/2014 0845   RDW 16.8* 12/24/2014 0845   LYMPHSABS 1.1 12/24/2014 0845   MONOABS 0.8 12/24/2014 0845   EOSABS 0.1 12/24/2014 0845   BASOSABS 0.0 12/24/2014 0845      Chemistry      Component Value Date/Time   NA 136 12/24/2014 0845   K 3.8 12/24/2014 0845   CL 104 12/24/2014 0845   CO2 25 12/24/2014 0845   BUN 13 12/24/2014 0845   CREATININE 0.79 12/24/2014 0845      Component Value Date/Time   CALCIUM 8.5* 12/24/2014 0845   ALKPHOS 129* 12/24/2014 0845   AST 31 12/24/2014 0845   ALT 23 12/24/2014 0845   BILITOT 0.4 12/24/2014 0845     Lab Results  Component Value Date   CEA 3.5 11/26/2014    Current outpatient prescriptions:  .  b complex vitamins tablet, Take 1 tablet by mouth daily., Disp: , Rfl:  .  docusate sodium (COLACE) 100 MG capsule, Take 1 capsule (100 mg total) by mouth 2 (two) times daily. (Patient taking differently: Take 100 mg by mouth daily. 3 in the am and 2 at night), Disp: 60 capsule, Rfl: 0 .  ferrous sulfate 325 (65 FE) MG tablet, Take 1 tablet (325 mg total) by mouth 2 (two) times daily., Disp: 60 tablet, Rfl: 0 .  ibuprofen (ADVIL,MOTRIN) 200 MG tablet, Take  600 mg by mouth every 6 (six) hours as needed for mild pain. , Disp: , Rfl:  .  leucovorin in dextrose 5 % 250 mL, Inject into the vein once. Every 14 days. To begin Monday 08/06/14, Disp: , Rfl:  .  lidocaine-prilocaine (EMLA) cream, Apply a quarter size amount to port site 1 hour prior to chemo. Do not rub in. Cover with plastic wrap., Disp: 30 g, Rfl: 3 .  Multiple Vitamin (MULTIVITAMIN WITH MINERALS) TABS tablet, Take 1 tablet by mouth daily., Disp: , Rfl:  .  OXALIPLATIN IV, Inject into the vein. Every 14 days. To begin Monday 08/06/14, Disp: , Rfl:  .  oxyCODONE-acetaminophen (PERCOCET/ROXICET) 5-325 MG per tablet, Take 1-2 tablets by mouth every 4 (four) hours as needed for moderate pain., Disp: 30 tablet, Rfl: 0 .  polyethylene glycol (MIRALAX /  GLYCOLAX) packet, Take 17 g by mouth daily as needed for mild constipation., Disp: 14 each, Rfl: 0 .  sodium chloride 0.9 % SOLN 150 mL with fluorouracil 5 GM/100ML SOLN 200 mg/m2/day, Inject 200 mg/m2/day into the vein. Every 14 days. To begin Monday 08/06/14. To infuse over 46 hours., Disp: , Rfl:  .  vitamin C (ASCORBIC ACID) 500 MG tablet, Take 500 mg by mouth daily., Disp: , Rfl:  .  ondansetron (ZOFRAN) 8 MG tablet, Take 1 tablet (8 mg total) by mouth every 8 (eight) hours as needed for nausea or vomiting. (Patient not taking: Reported on 12/24/2014), Disp: 30 tablet, Rfl: 3 .  prochlorperazine (COMPAZINE) 10 MG tablet, Take 1 tablet (10 mg total) by mouth every 6 (six) hours as needed for nausea or vomiting. (Patient not taking: Reported on 11/12/2014), Disp: 30 tablet, Rfl: 3 .  ranitidine (ZANTAC) 150 MG capsule, Take 150 mg by mouth 2 (two) times daily., Disp: , Rfl:  .  spironolactone (ALDACTONE) 50 MG tablet, Take 1 tablet (50 mg total) by mouth daily., Disp: 10 tablet, Rfl: 0 No current facility-administered medications for this visit.  Facility-Administered Medications Ordered in Other Visits:  .  sodium chloride 0.9 % injection 10 mL, 10 mL, Intravenous, PRN, Patrici Ranks, MD, 10 mL at 08/08/14 1237 .  sodium chloride 0.9 % injection 10 mL, 10 mL, Intravenous, PRN, Patrici Ranks, MD, 10 mL at 12/24/14 0914    ASSESSMENT AND PLAN:  Stage IV colon cancer, KRAS WT Cold-induced neuropathy  49 year old male with stage IV colon cancer currently on FOLFOX. Clinically he is improved.His weight is up.  He has tolerating treatment without significant difficulty. I have held his AVASTIN secondary to proteinuria. He has persistent "tingling" in the bottom of his feet. It is not interfering with his activities of daily living. However it is persistent and new. He has already received 10 cycles of oxaliplatin, and I would anticipate the development of neuropathy around this time if he was  going to get it. We are going to take a break from treatment today based upon his neuropathy and the fact he has not seen nephrology. We will regroup in one week for additional recommendations and treatment planning. His last CEA had normalized.  Anemia Iron deficiency  His anemia is improving, he is taking oral iron with good tolerance. Last ferritin was 94 ng/ml. We will consider discontinuing his oral iron moving forward.  Genetics Counseling  He has completed genetics counseling. His genetic test results were normal--i.e. testing found no meaningful changes to any of 19 genes associated with increased risks for colorectal and other types of  cancers.  Nephrotic Range Proteinuria  His Avastin has been discontinued for many weeks, but his proteinuria has not resolved, last 24 hr urine protein on 12/05/2014 showed 5875 mg/day of protein.  He has been referred to nephrology, but no appointment has been scheduled. We will follow-up with this today.   THERAPY PLAN:  Continue therapy a planned   All questions were answered. The patient knows to call the clinic with any problems, questions or concerns. We can certainly see the patient much sooner if necessary.   This document serves as a record of services personally performed by Ancil Linsey, MD. It was created on her behalf by Janace Hoard, a trained medical scribe. The creation of this record is based on the scribe's personal observations and the provider's statements to them. This document has been checked and approved by the attending provider.  I have reviewed the above documentation for accuracy and completeness, and I agree with the above.  Kelby Fam. Whitney Muse, MD

## 2014-12-24 NOTE — Patient Instructions (Signed)
[place] at [facility] Discharge Instructions  RECOMMENDATIONS MADE BY THE CONSULTANT AND ANY TEST RESULTS WILL BE SENT TO YOUR REFERRING PHYSICIAN.  Exam and discussion by Dr. Whitney Muse. Will hold chemotherapy today due to your increased neuropathy and need to get seen by Nephrology. Follow-up in 1 week.  Thank you for choosing [place] at [facility] to provide your oncology and hematology care.  To afford each patient quality time with our provider, please arrive at least 15 minutes before your scheduled appointment time.    You need to re-schedule your appointment should you arrive 10 or more minutes late.  We strive to give you quality time with our providers, and arriving late affects you and other patients whose appointments are after yours.  Also, if you no show three or more times for appointments you may be dismissed from the clinic at the providers discretion.     Again, thank you for choosing [facility].  Our hope is that these requests will decrease the amount of time that you wait before being seen by our physicians.       _____________________________________________________________  Should you have questions after your visit to [facility], please contact our office at [phone] between the hours of 8:30 a.m. and 4:30 p.m.  Voicemails left after 4:30 p.m. will not be returned until the following business day.  For prescription refill requests, have your pharmacy contact our office.

## 2014-12-26 ENCOUNTER — Encounter (HOSPITAL_COMMUNITY): Payer: 59

## 2015-01-01 ENCOUNTER — Encounter (HOSPITAL_COMMUNITY): Payer: Self-pay | Admitting: Hematology & Oncology

## 2015-01-01 ENCOUNTER — Encounter (HOSPITAL_COMMUNITY): Payer: Medicaid Other | Attending: Hematology & Oncology | Admitting: Hematology & Oncology

## 2015-01-01 VITALS — BP 101/61 | HR 67 | Temp 97.7°F | Resp 18 | Wt 178.1 lb

## 2015-01-01 DIAGNOSIS — C187 Malignant neoplasm of sigmoid colon: Secondary | ICD-10-CM | POA: Insufficient documentation

## 2015-01-01 DIAGNOSIS — C7801 Secondary malignant neoplasm of right lung: Secondary | ICD-10-CM

## 2015-01-01 DIAGNOSIS — C189 Malignant neoplasm of colon, unspecified: Secondary | ICD-10-CM | POA: Diagnosis not present

## 2015-01-01 DIAGNOSIS — R808 Other proteinuria: Secondary | ICD-10-CM | POA: Diagnosis not present

## 2015-01-01 DIAGNOSIS — C787 Secondary malignant neoplasm of liver and intrahepatic bile duct: Secondary | ICD-10-CM

## 2015-01-01 DIAGNOSIS — C7802 Secondary malignant neoplasm of left lung: Secondary | ICD-10-CM

## 2015-01-01 DIAGNOSIS — G629 Polyneuropathy, unspecified: Secondary | ICD-10-CM

## 2015-01-01 DIAGNOSIS — D509 Iron deficiency anemia, unspecified: Secondary | ICD-10-CM

## 2015-01-01 NOTE — Patient Instructions (Signed)
Tom [name] at [facility] Discharge Instructions  RECOMMENDATIONS MADE BY THE CONSULTANT AND ANY TEST RESULTS WILL BE SENT TO YOUR REFERRING PHYSICIAN.  Exam done and seen by Dr.Penland today. Will see you for your next visit in 2 weeks Given information on Xeloda Capecitabine tablets What is this medicine? CAPECITABINE (ka pe SITE a been) is a chemotherapy drug. It slows the growth of cancer cells. This medicine is used to treat breast cancer, and also colon or rectal cancer. This medicine may be used for other purposes; ask your health care provider or pharmacist if you have questions. COMMON BRAND NAME(S): Xeloda What should I tell my health care provider before I take this medicine? They need to know if you have any of these conditions: -bleeding or blood disorders -dihydropyrimidine dehydrogenase (DPD) deficiency -heart disease -infection (especially a virus infection such as chickenpox, cold sores, or herpes) -kidney disease -liver disease -an unusual or allergic reaction to capecitabine, 5-fluorouracil, other medicines, foods, dyes, or preservatives -pregnant or trying to get pregnant -breast-feeding How should I use this medicine? Take this medicine by mouth with a glass of water, within 30 minutes of the end of a meal. Do not cut, crush or chew this medicine. Follow the directions on the prescription label. Take your medicine at regular intervals. Do not take it more often than directed. Do not stop taking except on your doctor's advice. Your doctor may want you to take a combination of 150 mg and 500 mg tablets for each dose. It is very important that you know how to correctly take your dose. Taking the wrong tablets could result in an overdose (too much medication) or underdose (too little medication). Talk to your pediatrician regarding the use of this medicine in children. Special care may be needed. Overdosage: If you think you have taken too much of  this medicine contact a poison control center or emergency room at once. NOTE: This medicine is only for you. Do not share this medicine with others. What if I miss a dose? If you miss a dose, do not take the missed dose at all. Do not take double or extra doses. Instead, continue with your next scheduled dose and check with your doctor. What may interact with this medicine? -antacids with aluminum and/or magnesium -folic acid -leucovorin -medicines to increase blood counts like filgrastim, pegfilgrastim, sargramostim -phenytoin -vaccines -warfarin Talk to your doctor or health care professional before taking any of these medicines: -acetaminophen -aspirin -ibuprofen -ketoprofen -naproxen This list may not describe all possible interactions. Give your health care provider a list of all the medicines, herbs, non-prescription drugs, or dietary supplements you use. Also tell them if you smoke, drink alcohol, or use illegal drugs. Some items may interact with your medicine. What should I watch for while using this medicine? Visit your doctor for checks on your progress. This drug may make you feel generally unwell. This is not uncommon, as chemotherapy can affect healthy cells as well as cancer cells. Report any side effects. Continue your course of treatment even though you feel ill unless your doctor tells you to stop. In some cases, you may be given additional medicines to help with side effects. Follow all directions for their use. Call your doctor or health care professional for advice if you get a fever, chills or sore throat, or other symptoms of a cold or flu. Do not treat yourself. This drug decreases your body's ability to fight infections. Try to avoid being around  people who are sick. This medicine may increase your risk to bruise or bleed. Call your doctor or health care professional if you notice any unusual bleeding. Be careful brushing and flossing your teeth or using a toothpick  because you may get an infection or bleed more easily. If you have any dental work done, tell your dentist you are receiving this medicine. Avoid taking products that contain aspirin, acetaminophen, ibuprofen, naproxen, or ketoprofen unless instructed by your doctor. These medicines may hide a fever. Do not become pregnant while taking this medicine. Women should inform their doctor if they wish to become pregnant or think they might be pregnant. There is a potential for serious side effects to an unborn child. Talk to your health care professional or pharmacist for more information. Do not breast-feed an infant while taking this medicine. Men are advised not to father a child while taking this medicine. What side effects may I notice from receiving this medicine? Side effects that you should report to your doctor or health care professional as soon as possible: -allergic reactions like skin rash, itching or hives, swelling of the face, lips, or tongue -low blood counts - this medicine may decrease the number of white blood cells, red blood cells and platelets. You may be at increased risk for infections and bleeding. -signs of infection - fever or chills, cough, sore throat, pain or difficulty passing urine -signs of decreased platelets or bleeding - bruising, pinpoint red spots on the skin, black, tarry stools, blood in the urine -signs of decreased red blood cells - unusually weak or tired, fainting spells, lightheadedness -breathing problems -changes in vision -chest pain -dark urine -diarrhea of more than 4 bowel movements in one day or any diarrhea at night; bloody or watery diarrhea -dizziness -mouth sores -nausea and vomiting -pain, swelling, redness at site where injected -pain, tingling, numbness in the hands or feet -redness, swelling, or sores on hands or feet -stomach pain -vomiting -yellow color of skin or eyes Side effects that usually do not require medical attention (report  to your doctor or health care professional if they continue or are bothersome): -constipation -diarrhea -dry or itchy skin -hair loss -loss of appetite -nausea -weak or tired This list may not describe all possible side effects. Call your doctor for medical advice about side effects. You may report side effects to FDA at 1-800-FDA-1088. Where should I keep my medicine? Keep out of the reach of children. Store at room temperature between 15 and 30 degrees C (59 and 86 degrees F). Keep container tightly closed. Throw away any unused medicine after the expiration date. NOTE: This sheet is a summary. It may not cover all possible information. If you have questions about this medicine, talk to your doctor, pharmacist, or health care provider.  2015, [employer]vier/Gold Standard. (2013-03-24 12:47:46)   Thank you for choosing [place] at [facility] to provide your oncology and hematology care.  To afford each patient quality time with our provider, please arrive at least 15 minutes before your scheduled appointment time.    You need to re-schedule your appointment should you arrive 10 or more minutes late.  We strive to give you quality time with our providers, and arriving late affects you and other patients whose appointments are after yours.  Also, if you no show three or more times for appointments you may be dismissed from the clinic at the providers discretion.     Again, thank you for choosing [facility]  [place].  Our hope is that these requests will decrease the amount of time that you wait before being seen by our physicians.       _____________________________________________________________  Should you have questions after your visit to [facility], please contact our office at [phone] between the hours of 8:30 a.m. and 4:30 p.m.  Voicemails left after 4:30 p.m. will not be returned until the following business day.  For prescription refill  requests, have your pharmacy contact our office.

## 2015-01-01 NOTE — Progress Notes (Signed)
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 He has no major complaints today.   The patient visited a nephrologist yesterday and is doing a 24 urine sample along with having blood work completed.  His follow up is on 8/30. He had persistent and worsening proteinuria in spite of not receiving Avastin since 10/01/2014.  He states that his appetite and his mood have both improved.  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  This has not stopped him from going swimming however and does not interfere in his ADL's  He has complaints of experiencing abdomen and side pain this morning but attributes this to the laxative he took last night.  He usually takes stool softeners but since his last chemotherapy treatment on 7/11 he notices his stool is more firm. His last CEA 1 month ago was normal at 3.5. At diagnosis 48 ng/ml.    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 Appearance is most likely to represent diffuse hepatic metastasis   07/30/2014 Initial Diagnosis Colon, biopsy, sigmoid - ADENOCARCINOMA.   07/31/2014 Tumor Marker CEA- 48.0   07/31/2014 Imaging Ct abd/pelvis- concerning for primary colorectal neoplasm in the mid to distal sigmoid colon, with lymphadenopathy in the sigmoid mesocolon, ileocolic mesentery, and retroperitoneum, as well as widespread metastatic disease to the liver   08/01/2014 Imaging CT chest-  Pathologic thoracic, right hilar, and infrahilar adenopathy associated with a masslike region of possible consolidation in the right middle lobe, surrounding nodularity, and some scattered bilateral pulmonary nodules.   08/06/2014 -  Chemotherapy FOLFOX.  Avastin added for cycle 2.   10/08/2014 Adverse Reaction Avastin induced proteinuria >/= 2 gm Avastin on hold   10/15/2014 Treatment Plan Change Avastin held for proteinuria.   10/22/2014 Imaging Ct CAP- 1. Interval decrease and soft tissue adjacent to the mid sigmoid colon with decreasing sigmoid colon wall thickening. The  leocolic, mesenteric, perirectal, and mesocolonic lymphadenopathy has decreased in the interval.   10/30/2014 Survivorship Genetic Counseling. Genetic testing was normal, and did not reveal a deleterious mutation in these genes. The test report will be scanned into EPIC and will be located under the Media tab.    11/26/2014 Treatment Plan Change Oxaliplatin reduced x 25% due to PN.    I personally reviewed and went over laboratory results with the patient.  The results are noted within this dictation.  Past Medical History  Diagnosis Date  . 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     has No Known Allergies.  Tom [name] does not currently have medications on file.  Past Surgical History  Procedure Laterality Date  . Esophagogastroduodenoscopy N/A 07/30/2014    Procedure: ESOPHAGOGASTRODUODENOSCOPY (EGD);  Surgeon: Inda Castle, MD;  Location: [facility];  Service: Endoscopy;  Laterality:  N/A;  . Colonoscopy N/A 07/30/2014    Procedure: COLONOSCOPY;  Surgeon: Inda Castle, MD;  Location: [facility];  Service: Endoscopy;  Laterality: N/A;  . Portacath placement Right 08/01/14  . Portacath placement N/A 08/01/2014     Procedure: INSERTION RIGHT IJ PORT-A-CATH WITH ULTRASOUND AND FLUORO;  Surgeon: Gayland Curry, MD;  Location: [facility];  Service: General;  Laterality: N/A;  . Operative ultrasound N/A 08/01/2014    Procedure: OPERATIVE ULTRASOUND;  Surgeon: Gayland Curry, MD;  Location: [facility];  Service: General;  Laterality: N/A;    Denies any headaches, dizziness, double vision, fevers, chills, night sweats, nausea, vomiting, diarrhea, chest pain, heart palpitations, shortness of breath, blood in stool, black tarry stool, urinary pain, urinary burning, urinary frequency, hematuria. 14 point review of systems was performed and is negative except as detailed under history of present illness and above  PHYSICAL EXAMINATION  ECOG PERFORMANCE STATUS: 0 - Asymptomatic  Filed Vitals:   01/01/15 1144  BP: 101/61  Pulse: 67  Temp: 97.7 F (36.5 C)  Resp: 18    GENERAL:alert, no distress, well nourished, well developed, comfortable, cooperative and smiling, accompanied by his wife. SKIN: skin color, texture, turgor are normal, no rashes or significant lesions HEAD: Normocephalic, No masses, lesions, tenderness or abnormalities EYES: normal, PERRLA, EOMI, Conjunctiva are pink and non-injected EARS: External ears normal OROPHARYNX:lips, buccal mucosa, and tongue normal and mucous membranes are moist  NECK: supple, trachea midline LYMPH:  No palpable adenopathy in the neck or supraclavicular regions, no axillary adenopathy BREAST:not examined LUNGS: Air to auscultation bilaterally with no wheezing or rhonchi HEART: S1 and S2 audible, regular, no ectopy ABDOMEN:abdomen soft and normal bowel sounds. No palpable liver edge. BACK: Back symmetric, no curvature. EXTREMITIES:less then 2 second capillary refill, no joint deformities, effusion, or inflammation, no cyanosis  NEURO: alert & oriented x 3 with fluent speech, no focal motor/sensory deficits, gait normal   LABORATORY DATA: CBC    Component Value  Date/Time   WBC 4.5 12/24/2014 0845   RBC 4.06* 12/24/2014 0845   RBC 3.42* 07/28/2014 1440   HGB 12.3* 12/24/2014 0845   HCT 36.4* 12/24/2014 0845   PLT 132* 12/24/2014 0845   MCV 89.7 12/24/2014 0845   MCH 30.3 12/24/2014 0845   MCHC 33.8 12/24/2014 0845   RDW 16.8* 12/24/2014 0845   LYMPHSABS 1.1 12/24/2014 0845   MONOABS 0.8 12/24/2014 0845   EOSABS 0.1 12/24/2014 0845   BASOSABS 0.0 12/24/2014 0845      Chemistry      Component Value Date/Time   NA 136 12/24/2014 0845   K 3.8 12/24/2014 0845   CL 104 12/24/2014 0845   CO2 25 12/24/2014 0845   BUN 13 12/24/2014 0845   CREATININE 0.79 12/24/2014 0845      Component Value Date/Time   CALCIUM 8.5* 12/24/2014 0845   ALKPHOS 129* 12/24/2014 0845   AST 31 12/24/2014 0845   ALT 23 12/24/2014 0845   BILITOT 0.4 12/24/2014 0845     Lab Results  Component Value Date   CEA 3.5 11/26/2014    Current outpatient prescriptions:  .  b complex vitamins tablet, Take 1 tablet by mouth daily., Disp: , Rfl:  .  docusate sodium (COLACE) 100 MG capsule, Take 1 capsule (100 mg total) by mouth 2 (two) times daily. (Patient taking differently: Take 100 mg by mouth daily. 3 in the am and 2 at night), Disp: 60 capsule, Rfl: 0 .  ferrous sulfate 325 (65  FE) MG tablet, Take 1 tablet (325 mg total) by mouth 2 (two) times daily., Disp: 60 tablet, Rfl: 0 .  ibuprofen (ADVIL,MOTRIN) 200 MG tablet, Take 600 mg by mouth every 6 (six) hours as needed for mild pain. , Disp: , Rfl:  .  leucovorin in dextrose 5 % 250 mL, Inject into the vein once. Every 14 days. To begin Monday 08/06/14, Disp: , Rfl:  .  lidocaine-prilocaine (EMLA) cream, Apply a quarter size amount to port site 1 hour prior to chemo. Do not rub in. Cover with plastic wrap., Disp: 30 g, Rfl: 3 .  Multiple Vitamin (MULTIVITAMIN WITH MINERALS) TABS tablet, Take 1 tablet by mouth daily., Disp: , Rfl:  .  ondansetron (ZOFRAN) 8 MG tablet, Take 1 tablet (8 mg total) by mouth every 8 (eight)  hours as needed for nausea or vomiting. (Patient not taking: Reported on 12/24/2014), Disp: 30 tablet, Rfl: 3 .  OXALIPLATIN IV, Inject into the vein. Every 14 days. To begin Monday 08/06/14, Disp: , Rfl:  .  oxyCODONE-acetaminophen (PERCOCET/ROXICET) 5-325 MG per tablet, Take 1-2 tablets by mouth every 4 (four) hours as needed for moderate pain., Disp: 30 tablet, Rfl: 0 .  polyethylene glycol (MIRALAX / GLYCOLAX) packet, Take 17 g by mouth daily as needed for mild constipation., Disp: 14 each, Rfl: 0 .  prochlorperazine (COMPAZINE) 10 MG tablet, Take 1 tablet (10 mg total) by mouth every 6 (six) hours as needed for nausea or vomiting. (Patient not taking: Reported on 11/12/2014), Disp: 30 tablet, Rfl: 3 .  ranitidine (ZANTAC) 150 MG capsule, Take 150 mg by mouth 2 (two) times daily., Disp: , Rfl:  .  sodium chloride 0.9 % SOLN 150 mL with fluorouracil 5 GM/100ML SOLN 200 mg/m2/day, Inject 200 mg/m2/day into the vein. Every 14 days. To begin Monday 08/06/14. To infuse over 46 hours., Disp: , Rfl:  .  spironolactone (ALDACTONE) 50 MG tablet, Take 1 tablet (50 mg total) by mouth daily., Disp: 10 tablet, Rfl: 0 .  vitamin C (ASCORBIC ACID) 500 MG tablet, Take 500 mg by mouth daily., Disp: , Rfl:  No current facility-administered medications for this visit.  Facility-Administered Medications Ordered in Other Visits:  .  sodium chloride 0.9 % injection 10 mL, 10 mL, Intravenous, PRN, Patrici Ranks, MD, 10 mL at 08/08/14 1237    ASSESSMENT AND PLAN:  Stage IV colon cancer, KRAS WT Cold-induced neuropathy  49 year old male with stage IV colon cancer he has had a significant improvement in his disease and clinical status. He has developed neuropathy, it is not limiting his ADL's. However it is very noticeable and has become an issue for him. We discussed changing to maintenance therapy today. Ideally I would love to use 5-FU/Avastin but he can no longer have AVASTIN secondary to his proteinuria (see  below)  We will change him to maintenance XELODA. I discussed this drug with the patient in detail today and he is interested in proceeding with an oral option moving forward  Anemia Iron deficiency  His anemia is improving, he is taking oral iron with good tolerance. Last ferritin was 94 ng/ml. We will consider discontinuing his oral iron moving forward.  Genetics Counseling  He has completed genetics counseling. His genetic test results were normal--i.e. testing found no meaningful changes to any of 19 genes associated with increased risks for colorectal and other types of cancers.  Nephrotic Range Proteinuria  He has been off of Avastin which is most commonly implicated in proteinuria since  May 2. He had an appointment with nephrology and is currently undergoing evaluation.   THERAPY PLAN:  Continue therapy a planned   All questions were answered. The patient knows to call the clinic with any problems, questions or concerns. We can certainly see the patient much sooner if necessary.   This document serves as a record of services personally performed by Ancil Linsey, MD. It was created on her behalf by Janace Hoard, a trained medical scribe. The creation of this record is based on the scribe's personal observations and the provider's statements to them. This document has been checked and approved by the attending provider.  I have reviewed the above documentation for accuracy and completeness, and I agree with the above.  Kelby Fam. Whitney Muse, MD

## 2015-01-03 MED ORDER — CAPECITABINE 500 MG PO TABS: ORAL_TABLET | ORAL | Status: DC

## 2015-01-04 ENCOUNTER — Other Ambulatory Visit (HOSPITAL_COMMUNITY): Payer: Self-pay | Admitting: Hematology & Oncology

## 2015-01-04 MED ORDER — CAPECITABINE 500 MG PO TABS: ORAL_TABLET | ORAL | Status: DC

## 2015-01-07 ENCOUNTER — Encounter (HOSPITAL_COMMUNITY): Payer: Medicaid Other

## 2015-01-07 NOTE — Progress Notes (Signed)
Patient's therapy is being changed to Xeloda. No chemo treatment today.

## 2015-01-09 ENCOUNTER — Encounter (HOSPITAL_COMMUNITY): Payer: 59

## 2015-01-15 ENCOUNTER — Encounter: Payer: Self-pay | Admitting: *Deleted

## 2015-01-15 NOTE — Progress Notes (Signed)
[place] Clinical Social Work  Clinical Social Work was referred by Futures trader for assessment of psychosocial needs due to advanced cancer. Clinical Social Worker contacted patient at home to offer support and re-assess for needs.  Pt reports concerns with his medicaid possibly stopping due to soon receiving ss disability. Pt is very concerned about this and CSW made several suggestions for following up on this matter. Pt advised to follow up with his caseworker at [facility]. Pt reports he is the only one that drives in the home, but has extended family that can assist. He denies other concerns currently and agrees to reach out as needed.   Clinical Social Work interventions: Resource education   Loren Racer, [place] Tuesdays   [phone]

## 2015-01-17 ENCOUNTER — Encounter (HOSPITAL_COMMUNITY): Payer: Self-pay | Admitting: Oncology

## 2015-01-17 ENCOUNTER — Encounter (HOSPITAL_BASED_OUTPATIENT_CLINIC_OR_DEPARTMENT_OTHER): Payer: Medicaid Other | Admitting: Oncology

## 2015-01-17 VITALS — BP 98/54 | HR 65 | Temp 98.2°F | Resp 18 | Wt 187.6 lb

## 2015-01-17 DIAGNOSIS — R809 Proteinuria, unspecified: Secondary | ICD-10-CM | POA: Diagnosis not present

## 2015-01-17 DIAGNOSIS — C787 Secondary malignant neoplasm of liver and intrahepatic bile duct: Secondary | ICD-10-CM

## 2015-01-17 DIAGNOSIS — C7801 Secondary malignant neoplasm of right lung: Secondary | ICD-10-CM

## 2015-01-17 DIAGNOSIS — C189 Malignant neoplasm of colon, unspecified: Secondary | ICD-10-CM

## 2015-01-17 DIAGNOSIS — G629 Polyneuropathy, unspecified: Secondary | ICD-10-CM

## 2015-01-17 DIAGNOSIS — C7802 Secondary malignant neoplasm of left lung: Secondary | ICD-10-CM | POA: Diagnosis not present

## 2015-01-17 DIAGNOSIS — C187 Malignant neoplasm of sigmoid colon: Secondary | ICD-10-CM | POA: Diagnosis not present

## 2015-01-17 DIAGNOSIS — D509 Iron deficiency anemia, unspecified: Secondary | ICD-10-CM

## 2015-01-17 LAB — COMPREHENSIVE METABOLIC PANEL
ALBUMIN: 3.1 g/dL — AB (ref 3.5–5.0)
ALK PHOS: 111 U/L (ref 38–126)
ALT: 21 U/L (ref 17–63)
ANION GAP: 6 (ref 5–15)
AST: 26 U/L (ref 15–41)
BILIRUBIN TOTAL: 0.5 mg/dL (ref 0.3–1.2)
BUN: 11 mg/dL (ref 6–20)
CO2: 27 mmol/L (ref 22–32)
Calcium: 8.6 mg/dL — ABNORMAL LOW (ref 8.9–10.3)
Chloride: 106 mmol/L (ref 101–111)
Creatinine, Ser: 0.73 mg/dL (ref 0.61–1.24)
GFR calc non Af Amer: >60 mL/min (ref >60)
GLUCOSE: 96 mg/dL (ref 65–99)
POTASSIUM: 4.1 mmol/L (ref 3.5–5.1)
SODIUM: 139 mmol/L (ref 135–145)
Total Protein: 6.4 g/dL — ABNORMAL LOW (ref 6.5–8.1)

## 2015-01-17 LAB — CBC WITH DIFFERENTIAL/PLATELET
Basophils Absolute: 0 10*3/uL (ref 0.0–0.1)
Basophils Relative: 0 % (ref 0–1)
EOS ABS: 0.1 10*3/uL (ref 0.0–0.7)
Eosinophils Relative: 2 % (ref 0–5)
HEMATOCRIT: 35.1 % — AB (ref 39.0–52.0)
HEMOGLOBIN: 11.8 g/dL — AB (ref 13.0–17.0)
LYMPHS ABS: 1.7 10*3/uL (ref 0.7–4.0)
Lymphocytes Relative: 31 % (ref 12–46)
MCH: 30.6 pg (ref 26.0–34.0)
MCHC: 33.6 g/dL (ref 30.0–36.0)
MCV: 90.9 fL (ref 78.0–100.0)
MONOS PCT: 12 % (ref 3–12)
Monocytes Absolute: 0.7 10*3/uL (ref 0.1–1.0)
Neutro Abs: 3.2 10*3/uL (ref 1.7–7.7)
Neutrophils Relative %: 55 % (ref 43–77)
Platelets: 194 10*3/uL (ref 150–400)
RBC: 3.86 MIL/uL — ABNORMAL LOW (ref 4.22–5.81)
RDW: 15 % (ref 11.5–15.5)
WBC: 5.7 10*3/uL (ref 4.0–10.5)

## 2015-01-17 MED ORDER — HEPARIN SOD (PORK) LOCK FLUSH 100 UNIT/ML IV SOLN
500.0000 [IU] | Freq: Once | INTRAVENOUS | Status: AC
  Administered 2015-01-17: 500 [IU] via INTRAVENOUS

## 2015-01-17 MED ORDER — SODIUM CHLORIDE 0.9 % IJ SOLN
10.0000 mL | INTRAMUSCULAR | Status: DC | PRN
  Administered 2015-01-17: 10 mL via INTRAVENOUS
  Filled 2015-01-17: qty 10

## 2015-01-17 MED ORDER — HEPARIN SOD (PORK) LOCK FLUSH 100 UNIT/ML IV SOLN
INTRAVENOUS | Status: AC
  Filled 2015-01-17: qty 5

## 2015-01-17 NOTE — Progress Notes (Signed)
Tom [name] presented for [employer]. Labs per MD order drawn via Portacath located in the right chest wall accessed with  H 20 needle. Good blood return present. Procedure without incident.  Needle removed intact. Patient tolerated procedure well.

## 2015-01-17 NOTE — Patient Instructions (Signed)
[place] at [facility] Discharge Instructions  RECOMMENDATIONS MADE BY THE CONSULTANT AND ANY TEST RESULTS WILL BE SENT TO YOUR REFERRING PHYSICIAN.  Exam and discussion by Robynn Pane, PA-C No changes in therapy. Report mouth sores, pain or peeling of hands or feet, uncontrolled diarrhea or other concerns.  Follow-up in 21/2 weeks with labs and office visit.  Thank you for choosing [place] at [facility] to provide your oncology and hematology care.  To afford each patient quality time with our provider, please arrive at least 15 minutes before your scheduled appointment time.    You need to re-schedule your appointment should you arrive 10 or more minutes late.  We strive to give you quality time with our providers, and arriving late affects you and other patients whose appointments are after yours.  Also, if you no show three or more times for appointments you may be dismissed from the clinic at the providers discretion.     Again, thank you for choosing [facility].  Our hope is that these requests will decrease the amount of time that you wait before being seen by our physicians.       _____________________________________________________________  Should you have questions after your visit to [facility], please contact our office at [phone] between the hours of 8:30 a.m. and 4:30 p.m.  Voicemails left after 4:30 p.m. will not be returned until the following business day.  For prescription refill requests, have your pharmacy contact our office.

## 2015-01-17 NOTE — Progress Notes (Signed)
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  S/P 10 cycles of FOLFOX + Avastin (on hold due to proteinuria beginning on 10/01/2014) finishing on 12/10/2014.  Change ibn therapy secondary to response, increasing PN, and continued/worsening proteinuria.  INTERVAL HISTORY: Tom [name] 49 y.o. 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 Appearance is most likely to represent diffuse hepatic metastasis   07/30/2014 Initial Diagnosis Colon, biopsy, sigmoid - ADENOCARCINOMA.   07/31/2014 Tumor Marker CEA- 48.0   07/31/2014 Imaging Ct abd/pelvis- concerning for primary colorectal neoplasm in the mid to distal sigmoid colon, with lymphadenopathy in the sigmoid mesocolon, ileocolic mesentery, and retroperitoneum, as well as widespread metastatic disease to the liver   08/01/2014 Imaging CT chest- Pathologic thoracic, right hilar, and infrahilar adenopathy associated with a masslike region of possible consolidation in the right middle lobe, surrounding nodularity, and some scattered bilateral pulmonary nodules.   08/06/2014 - 12/10/2014 Chemotherapy FOLFOX.  Avastin added for cycle 2.  S/P 10 cycles.  Change to maintenance therapy secondary to positive response to therapy, increasing PN, and progressive proteinuria.   10/08/2014 Adverse Reaction Avastin induced proteinuria >/= 2 gm Avastin on hold   10/15/2014 Treatment Plan  Change Avastin held for proteinuria.   10/22/2014 Imaging Ct CAP- 1. Interval decrease and soft tissue adjacent to the mid sigmoid colon with decreasing sigmoid colon wall thickening. The  leocolic, mesenteric, perirectal, and mesocolonic lymphadenopathy has decreased in the interval.   10/30/2014 Survivorship Genetic Counseling. Genetic testing was normal, and did not reveal a deleterious mutation in these genes. The test report will be scanned into EPIC and will be located under the Media tab.    11/26/2014 Treatment Plan Change Oxaliplatin reduced x 25% due to PN.   12/24/2014 Treatment Plan Change Change to maintanence therapy.   01/09/2015 -  Chemotherapy Xeloda 2500 mg BID, 7 days on and 7 days off.    I personally reviewed and went over laboratory results with the patient.  The results are noted within this dictation.  These will be updated today.  He sees nephrology in follow-up on 8/30.  He is tolerating Xeloda well.  He reports his PN has changed sensation from numbness to "pins and needles."  It is not worse, just a different sensation.  He is asked to keep [name] in the loop on this side effects.    Otherwise, he is doing well.  He asked about CT scans and I will wait on that until his renal issues are sorted out and follow his biochemical response.  Utility of CT imaging is questionable given his clinical status and biochemical response to therapy.    Past Medical History  Diagnosis Date  . 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     has No Known Allergies.  Current Outpatient Prescriptions on File Prior to Visit  Medication Sig Dispense Refill  . capecitabine (XELODA) 500 MG tablet Take 5 tablets by mouth in the am and 5 tablets by mouth in the pm. Take daily for 7 days on and  then 7 days off. Repeat. 140 tablet 6  . docusate sodium (COLACE) 100 MG capsule Take 1 capsule (100 mg total) by mouth 2 (two) times daily. (Patient taking differently: Take 100 mg by mouth daily. 3 in the am and 2 at night) 60 capsule 0  . ferrous sulfate 325 (65 FE) MG tablet Take 1 tablet (325 mg total) by mouth 2 (two) times daily. 60 tablet 0  . ibuprofen (ADVIL,MOTRIN) 200 MG tablet Take 600 mg by mouth every 6 (six) hours as needed for mild pain.     [name] lidocaine-prilocaine (EMLA) cream Apply a quarter size amount to port site 1 hour prior to chemo. Do not rub in. Cover with plastic wrap. 30 g 3  . Multiple Vitamin (MULTIVITAMIN WITH MINERALS) TABS tablet Take 1 tablet by mouth daily.    . ondansetron (ZOFRAN) 8 MG tablet Take 1 tablet (8 mg total) by mouth every 8 (eight) hours as needed for nausea or vomiting. 30 tablet 3  . oxyCODONE-acetaminophen (PERCOCET/ROXICET) 5-325 MG per tablet Take 1-2 tablets by mouth every 4 (four) hours as needed for moderate pain. 30 tablet 0  . polyethylene glycol (MIRALAX / GLYCOLAX) packet Take 17 g by mouth daily as needed for mild constipation. 14 each 0  . prochlorperazine (COMPAZINE) 10 MG tablet Take 1 tablet (10 mg total) by mouth every 6 (six) hours as needed for nausea or vomiting. 30 tablet 3  . vitamin C (ASCORBIC ACID) 500 MG tablet Take 500 mg by mouth daily.    [name] b complex vitamins tablet Take 1 tablet by mouth daily.    [name] leucovorin in dextrose 5 % 250 mL Inject into the vein once. Every 14 days. To begin Monday 08/06/14    . OXALIPLATIN IV Inject into the vein. Every 14 days. To begin Monday 08/06/14    . ranitidine (ZANTAC) 150 MG capsule Take 150 mg by mouth 2 (two) times daily.    . sodium chloride 0.9 % SOLN 150 mL with fluorouracil 5 GM/100ML SOLN 200 mg/m2/day Inject 200 mg/m2/day into the vein. Every 14 days. To begin Monday 08/06/14. To infuse over 46 hours.    [name] spironolactone (ALDACTONE) 50 MG tablet Take 1 tablet (50 mg total) by mouth  daily. 10 tablet 0   Current Facility-Administered Medications on File Prior to Visit  Medication Dose Route Frequency Provider Last Rate Last Dose  . sodium chloride 0.9 % injection 10 mL  10 mL Intravenous PRN Patrici Ranks, MD   10 mL at 08/08/14 1237    Past Surgical History  Procedure Laterality Date  . Esophagogastroduodenoscopy N/A 07/30/2014    Procedure: ESOPHAGOGASTRODUODENOSCOPY (EGD);  Surgeon: Inda Castle, MD;  Location: [facility];  Service: Endoscopy;  Laterality: N/A;  . Colonoscopy N/A 07/30/2014    Procedure: COLONOSCOPY;  Surgeon: Inda Castle, MD;  Location: [facility];  Service: Endoscopy;  Laterality: N/A;  . Portacath placement Right 08/01/14  . Portacath placement N/A 08/01/2014    Procedure: INSERTION RIGHT IJ PORT-A-CATH WITH ULTRASOUND AND FLUORO;  Surgeon: Gayland Curry, MD;  Location: [facility];  Service: General;  Laterality: N/A;  . Operative ultrasound N/A 08/01/2014    Procedure: OPERATIVE ULTRASOUND;  Surgeon: Gayland Curry, MD;  Location: [facility];  Service: General;  Laterality: N/A;    Denies any headaches, dizziness, double vision, fevers, chills, night sweats, nausea, vomiting, diarrhea, constipation, chest pain, heart palpitations, shortness of breath, blood in stool, black tarry stool, urinary pain, urinary burning, urinary frequency, hematuria.   PHYSICAL EXAMINATION  ECOG PERFORMANCE STATUS: 1 - Symptomatic but completely ambulatory  Filed Vitals:   01/17/15 1000  BP: 98/54  Pulse: 65  Temp: 98.2 F (36.8 C)  Resp: 18    GENERAL:alert, no distress, well nourished, well developed, comfortable, cooperative, smiling and accompanied by wife SKIN: skin color, texture, turgor are normal, no rashes or significant lesions HEAD: Normocephalic, No masses, lesions, tenderness or abnormalities EYES: normal, PERRLA, EOMI, Conjunctiva are pink and non-injected EARS: External ears normal OROPHARYNX:lips, buccal mucosa, and tongue normal and mucous  membranes are moist  NECK: supple, no adenopathy, thyroid normal size, non-tender, without nodularity, no stridor, non-tender, trachea midline LYMPH:  no palpable lymphadenopathy BREAST:not examined LUNGS: clear to auscultation  HEART: regular rate & rhythm, no murmurs and no gallops ABDOMEN:abdomen soft, non-tender and normal bowel sounds BACK: Back symmetric, no curvature., No CVA tenderness EXTREMITIES:less then 2 second capillary refill, no joint deformities, effusion, or inflammation, no skin discoloration, no cyanosis, B/L LE edema 2+ pitting in ankles and feet.  NEURO: alert & oriented x 3 with fluent speech, no focal motor/sensory deficits, gait normal   LABORATORY DATA: CBC    Component Value Date/Time   WBC 4.5 12/24/2014 0845   RBC 4.06* 12/24/2014 0845   RBC 3.42* 07/28/2014 1440   HGB 12.3* 12/24/2014 0845   HCT 36.4* 12/24/2014 0845   PLT 132* 12/24/2014 0845   MCV 89.7 12/24/2014 0845   MCH 30.3 12/24/2014 0845   MCHC 33.8 12/24/2014 0845   RDW 16.8* 12/24/2014 0845   LYMPHSABS 1.1 12/24/2014 0845   MONOABS 0.8 12/24/2014 0845   EOSABS 0.1 12/24/2014 0845   BASOSABS 0.0 12/24/2014 0845      Chemistry      Component Value Date/Time   NA 136 12/24/2014 0845   K 3.8 12/24/2014 0845   CL 104 12/24/2014 0845   CO2 25 12/24/2014 0845   BUN 13 12/24/2014 0845   CREATININE 0.79 12/24/2014 0845      Component Value Date/Time   CALCIUM 8.5* 12/24/2014 0845   ALKPHOS 129* 12/24/2014 0845   AST 31 12/24/2014 0845   ALT 23 12/24/2014 0845   BILITOT 0.4 12/24/2014 0845     Lab Results  Component Value Date   CEA 3.5 11/26/2014      ASSESSMENT AND PLAN:  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   Oncology history updated.  Genetic Counseling is complete and is negative.  His proteinuria is currently undergoing evaluation with nephrology.  He sees nephrology in follow-up on 8/30  Continue Xeloda as planned 2500 mg BID, 7 days on and 7 days off.  He wants to get back to taking chemotherapy on Mondays.  Therefore, he will start his Xeloda on Tuesday and then on Monday for his next treatment.  Return in 2 weeks for follow-up.    THERAPY PLAN:  Continue with treatment as planned.  All questions were answered. The patient knows to call the clinic  with any problems, questions or concerns. We can certainly see the patient much sooner if necessary.  Patient and plan discussed with Dr. Ancil Linsey and she is in agreement with the aforementioned.   This note is electronically signed by: Robynn Pane, PA-C 01/17/2015 11:30 AM

## 2015-01-17 NOTE — Assessment & Plan Note (Addendum)
49 year old male with stage IV colon cancer currently on Xeloda 2500 mg BID, 7 days on and 7 days off maintenance secondary to excellent response to FOLFOX therapy, increasing PN, and progressive proteinuria after completing 10 cycles of FOLFOX with last treatment on 12/10/2014.   Oncology history updated.  Genetic Counseling is complete and is negative.  His proteinuria is currently undergoing evaluation with nephrology.  He sees nephrology in follow-up on 8/30  Continue Xeloda as planned 2500 mg BID, 7 days on and 7 days off.  He wants to get back to taking chemotherapy on Mondays.  Therefore, he will start his Xeloda on Tuesday and then on Monday for his next treatment.  Return in 2 weeks for follow-up.

## 2015-01-18 LAB — CEA: CEA: 2.1 ng/mL (ref 0.0–4.7)

## 2015-01-30 ENCOUNTER — Inpatient Hospital Stay (HOSPITAL_COMMUNITY)
Admission: EM | Admit: 2015-01-30 | Discharge: 2015-01-31 | DRG: 389 | Disposition: A | Discharge status: Home / Self Care | Payer: Medicaid Other | Attending: Internal Medicine | Admitting: Internal Medicine

## 2015-01-30 ENCOUNTER — Encounter (HOSPITAL_COMMUNITY): Payer: Self-pay | Admitting: Emergency Medicine

## 2015-01-30 ENCOUNTER — Emergency Department (HOSPITAL_COMMUNITY): Payer: Medicaid Other

## 2015-01-30 DIAGNOSIS — C189 Malignant neoplasm of colon, unspecified: Secondary | ICD-10-CM | POA: Diagnosis present

## 2015-01-30 DIAGNOSIS — C787 Secondary malignant neoplasm of liver and intrahepatic bile duct: Secondary | ICD-10-CM | POA: Diagnosis present

## 2015-01-30 DIAGNOSIS — K56609 Unspecified intestinal obstruction, unspecified as to partial versus complete obstruction: Secondary | ICD-10-CM

## 2015-01-30 DIAGNOSIS — R110 Nausea: Secondary | ICD-10-CM | POA: Diagnosis not present

## 2015-01-30 DIAGNOSIS — R109 Unspecified abdominal pain: Secondary | ICD-10-CM | POA: Diagnosis present

## 2015-01-30 DIAGNOSIS — Z87891 Personal history of nicotine dependence: Secondary | ICD-10-CM

## 2015-01-30 DIAGNOSIS — C7802 Secondary malignant neoplasm of left lung: Secondary | ICD-10-CM | POA: Diagnosis present

## 2015-01-30 DIAGNOSIS — Z9221 Personal history of antineoplastic chemotherapy: Secondary | ICD-10-CM

## 2015-01-30 DIAGNOSIS — R1084 Generalized abdominal pain: Secondary | ICD-10-CM

## 2015-01-30 DIAGNOSIS — C7801 Secondary malignant neoplasm of right lung: Secondary | ICD-10-CM | POA: Diagnosis present

## 2015-01-30 DIAGNOSIS — Z8249 Family history of ischemic heart disease and other diseases of the circulatory system: Secondary | ICD-10-CM | POA: Diagnosis not present

## 2015-01-30 DIAGNOSIS — R1030 Lower abdominal pain, unspecified: Secondary | ICD-10-CM | POA: Diagnosis not present

## 2015-01-30 DIAGNOSIS — Z800 Family history of malignant neoplasm of digestive organs: Secondary | ICD-10-CM

## 2015-01-30 DIAGNOSIS — K5660 Unspecified intestinal obstruction: Secondary | ICD-10-CM | POA: Diagnosis present

## 2015-01-30 DIAGNOSIS — K5669 Other intestinal obstruction: Secondary | ICD-10-CM | POA: Diagnosis not present

## 2015-01-30 LAB — CBC WITH DIFFERENTIAL/PLATELET
BASOS ABS: 0 10*3/uL (ref 0.0–0.1)
BASOS PCT: 0 % (ref 0–1)
Eosinophils Absolute: 0.1 10*3/uL (ref 0.0–0.7)
Eosinophils Relative: 1 % (ref 0–5)
HEMATOCRIT: 38.1 % — AB (ref 39.0–52.0)
HEMOGLOBIN: 13.3 g/dL (ref 13.0–17.0)
Lymphocytes Relative: 19 % (ref 12–46)
Lymphs Abs: 1.5 10*3/uL (ref 0.7–4.0)
MCH: 31.4 pg (ref 26.0–34.0)
MCHC: 34.9 g/dL (ref 30.0–36.0)
MCV: 90.1 fL (ref 78.0–100.0)
Monocytes Absolute: 0.7 10*3/uL (ref 0.1–1.0)
Monocytes Relative: 9 % (ref 3–12)
NEUTROS ABS: 5.8 10*3/uL (ref 1.7–7.7)
NEUTROS PCT: 71 % (ref 43–77)
Platelets: 211 10*3/uL (ref 150–400)
RBC: 4.23 MIL/uL (ref 4.22–5.81)
RDW: 15.3 % (ref 11.5–15.5)
WBC: 8.1 10*3/uL (ref 4.0–10.5)

## 2015-01-30 LAB — COMPREHENSIVE METABOLIC PANEL
ALBUMIN: 3.8 g/dL (ref 3.5–5.0)
ALK PHOS: 105 U/L (ref 38–126)
ALT: 23 U/L (ref 17–63)
AST: 28 U/L (ref 15–41)
Anion gap: 8 (ref 5–15)
BILIRUBIN TOTAL: 0.9 mg/dL (ref 0.3–1.2)
BUN: 13 mg/dL (ref 6–20)
CALCIUM: 8.9 mg/dL (ref 8.9–10.3)
CO2: 25 mmol/L (ref 22–32)
Chloride: 101 mmol/L (ref 101–111)
Creatinine, Ser: 0.78 mg/dL (ref 0.61–1.24)
GFR calc Af Amer: >60 mL/min (ref >60)
GFR calc non Af Amer: >60 mL/min (ref >60)
GLUCOSE: 116 mg/dL — AB (ref 65–99)
POTASSIUM: 4.4 mmol/L (ref 3.5–5.1)
SODIUM: 134 mmol/L — AB (ref 135–145)
TOTAL PROTEIN: 7.4 g/dL (ref 6.5–8.1)

## 2015-01-30 LAB — LIPASE, BLOOD: Lipase: 19 U/L — ABNORMAL LOW (ref 22–51)

## 2015-01-30 MED ORDER — ACETAMINOPHEN 325 MG PO TABS: 650.0000 mg | ORAL_TABLET | Freq: Four times a day (QID) | ORAL | Status: DC | PRN

## 2015-01-30 MED ORDER — HYDROMORPHONE HCL 1 MG/ML IJ SOLN
INTRAMUSCULAR | Status: AC
  Administered 2015-01-30: 0.5 mg
  Filled 2015-01-30: qty 1

## 2015-01-30 MED ORDER — HYDROMORPHONE HCL 1 MG/ML IJ SOLN: 1.0000 mg | Freq: Once | INTRAMUSCULAR | Status: DC

## 2015-01-30 MED ORDER — HYDROMORPHONE HCL 1 MG/ML IJ SOLN
INTRAMUSCULAR | Status: AC
  Administered 2015-01-30: 22:00:00
  Filled 2015-01-30: qty 1

## 2015-01-30 MED ORDER — SODIUM CHLORIDE 0.9 % IV SOLN
INTRAVENOUS | Status: DC
  Administered 2015-01-30: 23:00:00 via INTRAVENOUS

## 2015-01-30 MED ORDER — IOHEXOL 300 MG/ML  SOLN
100.0000 mL | Freq: Once | INTRAMUSCULAR | Status: AC | PRN
  Administered 2015-01-30: 100 mL via INTRAVENOUS

## 2015-01-30 MED ORDER — HYDROMORPHONE HCL 1 MG/ML IJ SOLN
0.5000 mg | Freq: Once | INTRAMUSCULAR | Status: AC
  Administered 2015-01-30: 0.5 mg via INTRAVENOUS
  Filled 2015-01-30: qty 1

## 2015-01-30 MED ORDER — HYDROMORPHONE HCL 1 MG/ML IJ SOLN
0.5000 mg | INTRAMUSCULAR | Status: DC | PRN
  Administered 2015-01-30 – 2015-01-31 (×2): 1 mg via INTRAVENOUS
  Filled 2015-01-30 (×2): qty 1

## 2015-01-30 MED ORDER — ONDANSETRON HCL 4 MG/2ML IJ SOLN: 4.0000 mg | Freq: Four times a day (QID) | INTRAMUSCULAR | Status: DC | PRN

## 2015-01-30 MED ORDER — ONDANSETRON HCL 4 MG PO TABS: 4.0000 mg | ORAL_TABLET | Freq: Four times a day (QID) | ORAL | Status: DC | PRN

## 2015-01-30 MED ORDER — IOHEXOL 300 MG/ML  SOLN
25.0000 mL | Freq: Once | INTRAMUSCULAR | Status: AC | PRN
  Administered 2015-01-30: 25 mL via ORAL

## 2015-01-30 MED ORDER — PANTOPRAZOLE SODIUM 40 MG IV SOLR
40.0000 mg | Freq: Two times a day (BID) | INTRAVENOUS | Status: DC
  Administered 2015-01-30 – 2015-01-31 (×2): 40 mg via INTRAVENOUS
  Filled 2015-01-30 (×2): qty 40

## 2015-01-30 MED ORDER — ONDANSETRON HCL 4 MG/2ML IJ SOLN
4.0000 mg | Freq: Once | INTRAMUSCULAR | Status: AC
Start: 2015-01-30 — End: 2015-01-30
  Administered 2015-01-30: 4 mg via INTRAVENOUS
  Filled 2015-01-30: qty 2

## 2015-01-30 MED ORDER — ACETAMINOPHEN 650 MG RE SUPP: 650.0000 mg | Freq: Four times a day (QID) | RECTAL | Status: DC | PRN

## 2015-01-30 NOTE — ED Notes (Addendum)
Pt has colon cancer stage 4, Pt took Percocet at 2pm , pt is better at present

## 2015-01-30 NOTE — ED Notes (Signed)
Onsite last night abdominal pain, denies nausea, vomiting or diarrhea, last BM Monday.

## 2015-01-30 NOTE — H&P (Signed)
[facility] Admission History and Physical       KALMAN NYLEN YPP:[id number] DOB: 1966/04/30 DOA: 01/30/2015  Referring physician:  PCP: [name], MD  Specialists:   Chief Complaint:   HPI: Tom [name] is a 49 y.o. male with a history of  Metastatic Adenocarcinoma of the Colon to the Lungs and Liver Dx 07/2014 on Chemo Rx who presents to the ED with complaints of 10/10 sharp intermittent Mid-ABD Pain since last night.  He reports nausea but no vomiting, and his last BM was 2 days ago.    A ct Scan was performed in the ED and revealed an SBO, and General surgery  Dr. Aviva Signs was consulted and will see in the AM.  He was referred for admission.      Review of Systems:  Constitutional: No Weight Loss, No Weight Gain, Night Sweats, Fevers, Chills, Dizziness, Light Headedness, Fatigue, or Generalized Weakness HEENT: No Headaches, Difficulty Swallowing,Tooth/Dental Problems,Sore Throat,  No Sneezing, Rhinitis, Ear Ache, Nasal Congestion, or Post Nasal Drip,  Cardio-vascular:  No Chest pain, Orthopnea, PND, Edema in Lower Extremities, Anasarca, Dizziness, Palpitations  Resp: No Dyspnea, No DOE, No Productive Cough, No Non-Productive Cough, No Hemoptysis, No Wheezing.    GI: No Heartburn, Indigestion, +Abdominal Pain, +Nausea, Vomiting, Diarrhea, Constipation, Hematemesis, Hematochezia, Melena, Change in Bowel Habits,  Loss of Appetite  GU: No Dysuria, No Change in Color of Urine, No Urgency or Urinary Frequency, No Flank pain.  Musculoskeletal: No Joint Pain or Swelling, No Decreased Range of Motion, No Back Pain.  Neurologic: No Syncope, No Seizures, Muscle Weakness, Paresthesia, Vision Disturbance or Loss, No Diplopia, No Vertigo, No Difficulty Walking,  Skin: No Rash or Lesions. Psych: No Change in Mood or Affect, No Depression or Anxiety, No Memory loss, No Confusion, or Hallucinations   Past Medical History  Diagnosis Date  . Adenocarcinoma of colon  metastatic to liver 07/30/2014     Past Surgical History  Procedure Laterality Date  . Esophagogastroduodenoscopy N/A 07/30/2014    Procedure: ESOPHAGOGASTRODUODENOSCOPY (EGD);  Surgeon: Inda Castle, MD;  Location: [facility];  Service: Endoscopy;  Laterality: N/A;  . Colonoscopy N/A 07/30/2014    Procedure: COLONOSCOPY;  Surgeon: Inda Castle, MD;  Location: [facility];  Service: Endoscopy;  Laterality: N/A;  . Portacath placement Right 08/01/14  . Portacath placement N/A 08/01/2014    Procedure: INSERTION RIGHT IJ PORT-A-CATH WITH ULTRASOUND AND FLUORO;  Surgeon: Gayland Curry, MD;  Location: [facility];  Service: General;  Laterality: N/A;  . Operative ultrasound N/A 08/01/2014    Procedure: OPERATIVE ULTRASOUND;  Surgeon: Gayland Curry, MD;  Location: [facility];  Service: General;  Laterality: N/A;      Prior to Admission medications   Medication Sig Start Date End Date Taking? Authorizing Provider  capecitabine (XELODA) 500 MG tablet Take 5 tablets by mouth in the am and 5 tablets by mouth in the pm. Take daily for 7 days on and then 7 days off. Repeat. 01/04/15  Yes Patrici Ranks, MD  docusate sodium (COLACE) 100 MG capsule Take 1 capsule (100 mg total) by mouth 2 (two) times daily. Patient taking differently: Take 100 mg by mouth daily. 3 in the am and 2 at night 08/01/14  Yes Theodis Blaze, MD  ferrous sulfate 325 (65 FE) MG tablet Take 1 tablet (325 mg total) by mouth 2 (two) times daily. 08/01/14  Yes Theodis Blaze, MD  lidocaine-prilocaine (EMLA) cream Apply a quarter size amount  to port site 1 hour prior to chemo. Do not rub in. Cover with plastic wrap. 08/01/14  Yes Patrici Ranks, MD  Multiple Vitamin (MULTIVITAMIN WITH MINERALS) TABS tablet Take 1 tablet by mouth daily.   Yes Historical Provider, MD  oxyCODONE-acetaminophen (PERCOCET/ROXICET) 5-325 MG per tablet Take 1-2 tablets by mouth every 4 (four) hours as needed for moderate pain. 11/26/14  Yes Manon Hilding Kefalas, PA-C    polyethylene glycol (MIRALAX / GLYCOLAX) packet Take 17 g by mouth daily as needed for mild constipation. 08/01/14  Yes Theodis Blaze, MD  prochlorperazine (COMPAZINE) 10 MG tablet Take 1 tablet (10 mg total) by mouth every 6 (six) hours as needed for nausea or vomiting. 08/01/14  Yes Patrici Ranks, MD  spironolactone (ALDACTONE) 50 MG tablet Take 1 tablet (50 mg total) by mouth daily. Patient taking differently: Take 50 mg by mouth daily as needed (FOR FLUID).  11/26/14 01/30/15 Yes Manon Hilding Kefalas, PA-C  vitamin C (ASCORBIC ACID) 500 MG tablet Take 500 mg by mouth daily.   Yes Historical Provider, MD  leucovorin in dextrose 5 % 250 mL Inject into the vein once. Every 14 days. To begin Monday 08/06/14    Historical Provider, MD  ondansetron (ZOFRAN) 8 MG tablet Take 1 tablet (8 mg total) by mouth every 8 (eight) hours as needed for nausea or vomiting. 08/01/14   Theodis Blaze, MD  OXALIPLATIN IV Inject into the vein. Every 14 days. To begin Monday 08/06/14    Historical Provider, MD  sodium chloride 0.9 % SOLN 150 mL with fluorouracil 5 GM/100ML SOLN 200 mg/m2/day Inject 200 mg/m2/day into the vein. Every 14 days. To begin Monday 08/06/14. To infuse over 46 hours.    Historical Provider, MD     No Known Allergies  Social History:  reports that he has never smoked. He quit smokeless tobacco use about 25 years ago. His smokeless tobacco use included Chew. He reports that he does not drink alcohol or use illicit drugs.    Family History  Problem Relation Age of Onset  . Cancer Mother     Deceased at 83 years old from metastatic colon cancer  . Kidney failure Father   . Heart disease Father     Deceased in 40-50's  . Colon polyps Brother   . Cancer Maternal Aunt        Physical Exam:  GEN:  Pleasant Well Nourished and Well Developed 49 y.o. Caucasian male examined and in no acute distress; cooperative with exam Filed Vitals:   01/30/15 1549 01/30/15 1812 01/30/15 2020  BP: 137/87 127/81 133/86   Pulse: 80 67 63  Temp: 97.8 F (36.6 C)  98.3 F (36.8 C)  TempSrc: Oral  Oral  Resp: 18 14 18   Height: 5\' 8"  (1.727 m)    Weight: 88.451 kg (195 lb)    SpO2: 100% 96% 99%   Blood pressure 133/86, pulse 63, temperature 98.3 F (36.8 C), temperature source Oral, resp. rate 18, height 5\' 8"  (1.727 m), weight 88.451 kg (195 lb), SpO2 99 %. PSYCH: He is alert and oriented x4; does not appear anxious does not appear depressed; affect is normal HEENT: Normocephalic and Atraumatic, Mucous membranes pink; PERRLA; EOM intact; Fundi:  Benign;  No scleral icterus, Nares: Patent, Oropharynx: Clear, Poor Dentition,    Neck:  FROM, No Cervical Lymphadenopathy nor Thyromegaly or Carotid Bruit; No JVD; Breasts:: Not examined CHEST WALL: No tenderness CHEST: Normal respiration, clear to auscultation bilaterally HEART: Regular  rate and rhythm; no murmurs rubs or gallops BACK: No kyphosis or scoliosis; No CVA tenderness ABDOMEN: Positive Bowel Sounds, Soft Non-Tender, No Rebound or Guarding; No Masses, No Organomegaly Rectal Exam: Not done EXTREMITIES: No Cyanosis, Clubbing, or Edema; No Ulcerations. Genitalia: not examined PULSES: 2+ and symmetric SKIN: Normal hydration no rash or ulceration CNS:  Alert and Oriented x 4, No Focal Deficits Vascular: pulses palpable throughout    Labs on Admission:  Basic Metabolic Panel:  Recent Labs Lab 01/30/15 1700  NA 134*  K 4.4  CL 101  CO2 25  GLUCOSE 116*  BUN 13  CREATININE 0.78  CALCIUM 8.9   Liver Function Tests:  Recent Labs Lab 01/30/15 1700  AST 28  ALT 23  ALKPHOS 105  BILITOT 0.9  PROT 7.4  ALBUMIN 3.8    Recent Labs Lab 01/30/15 1700  LIPASE 19*   No results for input(s): AMMONIA in the last 168 hours. CBC:  Recent Labs Lab 01/30/15 1700  WBC 8.1  NEUTROABS 5.8  HGB 13.3  HCT 38.1*  MCV 90.1  PLT 211   Cardiac Enzymes: No results for input(s): CKTOTAL, CKMB, CKMBINDEX, TROPONINI in the last 168  hours.  BNP (last 3 results) No results for input(s): BNP in the last 8760 hours.  ProBNP (last 3 results) No results for input(s): PROBNP in the last 8760 hours.  CBG: No results for input(s): GLUCAP in the last 168 hours.  Radiological Exams on Admission: Ct Abdomen Pelvis W Contrast  01/30/2015   CLINICAL DATA:  Lower abdominal pain since last night. History of colon cancer with metastatic disease to liver and lungs.  EXAM: CT ABDOMEN AND PELVIS WITH CONTRAST  TECHNIQUE: Multidetector CT imaging of the abdomen and pelvis was performed using the standard protocol following bolus administration of intravenous contrast.  CONTRAST:  39mL OMNIPAQUE IOHEXOL 300 MG/ML SOLN, 143mL OMNIPAQUE IOHEXOL 300 MG/ML SOLN  COMPARISON:  10/22/2014 and 07/30/2014  FINDINGS: Lung bases are within normal. Tip of Port-A-Cath seen over the cavoatrial junction.  Abdominal images demonstrate overall decrease in size and number of multiple known liver metastases as the largest lesion is over the superior aspect of the right lobe measuring 4 x 4.8 cm (previously 4.6 x 5.6 cm).  The spleen, pancreas, gallbladder and adrenal glands are within normal. Minimal calcified plaque of the abdominal aorta. Appendix is within normal. Kidneys are normal in size without hydronephrosis or nephrolithiasis. There are several simple left renal cysts unchanged. There is a sub cm left renal cortical hypodensity too small to characterize but likely cysts and unchanged. Ureters are within normal.  Minimal diverticulosis of the colon. Mild fecal retention throughout the colon. Persistent mild wall thickening the sigmoid colon with focal stricturing near the rectosigmoid junction in the midline pelvis without significant change as this is thought to represent the site of patient's original colon cancer. Decrease in size and number of several adjacent mesenteric lymph nodes.  There are multiple fluid-filled mildly dilated small bowel loops involving  the distal small bowel with transition point in the right upper pelvis involving the ileum immediately adjacent to the focal malignant stricturing of the rectosigmoid junction described above. The terminal ileum distal to this point is within normal.  Remaining pelvic images demonstrate the bladder, prostate and lower rectum to be within normal.  There are degenerative changes of the spine and minimal degenerate change of the hips.  IMPRESSION: Evidence of distal small bowel obstruction with transition point over the ileum in the right  upper pelvis immediately adjacent/abutting the known malignant stricturing of the sigmoid colon.  There is wall thickening of the sigmoid colon with malignant stricturing at the site of patient's known colon cancer without significant change from the recent prior exam. Overall decreased in size in number adjacent mesenteric lymph nodes. Overall decreased in size and number of multiple liver metastases.  Simple left renal cyst. Sub cm left renal cortical hypodensity too small to characterize but likely a cyst and unchanged.  Mild diverticulosis of the colon.   Electronically Signed   By: Marin Olp M.D.   On: 01/30/2015 18:25       Assessment/Plan:   49 y.o. male with  Principal Problem:   1.    SBO (small bowel obstruction)   Bowel Rest   IV Protonix   IVFs   General Surgery Dr. Aviva Signs to see in AM   Active Problems:   2.    ABD Pain   PRN IV Dilaudid     3.    Nausea   PRN IV Zofran     4.   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   DVT Prophylaxis   SCDs        Code Status:     FULL CODE        Family Communication:   Family at Bedside    Disposition Plan:    Inpatient Status        Time spent:  [facility] Pager [phone]   If [place] Please Contact the Day Rounding Team MD for [facility]  If 7PM-7AM, Please Contact Night-Floor  Coverage  [email or web address] Password [email or web address] 01/30/2015, 9:21 PM     ADDENDUM:   Patient was seen and examined on 01/30/2015

## 2015-01-30 NOTE — ED Provider Notes (Signed)
CSN: [id number]     Arrival date & time 01/30/15  1545 History   First MD Initiated Contact with Patient 01/30/15 1627     Chief Complaint  Patient presents with  . Abdominal Pain     (Consider location/radiation/quality/duration/timing/severity/associated sxs/prior Treatment) Patient is a 49 y.o. male presenting with abdominal pain. The history is provided by the patient (The patient complained of abdominal pain since last night with nausea. The pain is midepigastric).  Abdominal Pain Pain location:  Epigastric Pain quality: aching   Pain radiates to:  Does not radiate Pain severity:  Moderate Onset quality:  Gradual Timing:  Constant Progression:  Worsening Chronicity:  New Context: not alcohol use   Associated symptoms: no chest pain, no cough, no diarrhea, no fatigue and no hematuria     Past Medical History  Diagnosis Date  . 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 Esophagogastroduodenoscopy N/A 07/30/2014    Procedure: ESOPHAGOGASTRODUODENOSCOPY (EGD);  Surgeon: Inda Castle, MD;  Location: [facility];  Service: Endoscopy;  Laterality: N/A;  . Colonoscopy N/A 07/30/2014    Procedure: COLONOSCOPY;  Surgeon: Inda Castle, MD;  Location: [facility];  Service: Endoscopy;  Laterality: N/A;  . Portacath placement Right 08/01/14  . Portacath placement N/A 08/01/2014    Procedure: INSERTION RIGHT IJ PORT-A-CATH WITH ULTRASOUND AND FLUORO;  Surgeon: Gayland Curry, MD;  Location: [facility];  Service: General;  Laterality: N/A;  . Operative ultrasound N/A 08/01/2014    Procedure: OPERATIVE ULTRASOUND;  Surgeon: Gayland Curry, MD;  Location: [facility];  Service: General;  Laterality: N/A;   Family History  Problem Relation Age of Onset  . Cancer Mother     Deceased at 70 years old from metastatic colon cancer  . Kidney failure Father   . Heart disease Father     Deceased in 40-50's  . Colon polyps Brother   .  Cancer Maternal Aunt    Social History  Substance Use Topics  . Smoking status: Never Smoker   . Smokeless tobacco: Former Systems developer    Types: [place] date: 06/01/1989  . Alcohol Use: No    Review of Systems  Constitutional: Negative for appetite change and fatigue.  HENT: Negative for congestion, ear discharge and sinus pressure.   Eyes: Negative for discharge.  Respiratory: Negative for cough.   Cardiovascular: Negative for chest pain.  Gastrointestinal: Positive for abdominal pain. Negative for diarrhea.  Genitourinary: Negative for frequency and hematuria.  Musculoskeletal: Negative for back pain.  Skin: Negative for rash.  Neurological: Negative for seizures and headaches.  Psychiatric/Behavioral: Negative for hallucinations.      Allergies  Review of patient's allergies indicates no known allergies.  Home Medications   Prior to Admission medications   Medication Sig Start Date End Date Taking? Authorizing Provider  b complex vitamins tablet Take 1 tablet by mouth daily.    Historical Provider, MD  capecitabine (XELODA) 500 MG tablet Take 5 tablets by mouth in the am and 5 tablets by mouth in the pm. Take daily for 7 days on and then 7 days off. Repeat. 01/04/15   Patrici Ranks, MD  docusate sodium (COLACE) 100 MG capsule Take 1 capsule (100 mg total) by mouth 2 (two) times daily. Patient taking differently: Take 100 mg by mouth daily. 3 in the am and 2 at night 08/01/14   Theodis Blaze, MD  ferrous sulfate 325 (65  FE) MG tablet Take 1 tablet (325 mg total) by mouth 2 (two) times daily. 08/01/14   Theodis Blaze, MD  ibuprofen (ADVIL,MOTRIN) 200 MG tablet Take 600 mg by mouth every 6 (six) hours as needed for mild pain.     Historical Provider, MD  leucovorin in dextrose 5 % 250 mL Inject into the vein once. Every 14 days. To begin Monday 08/06/14    Historical Provider, MD  lidocaine-prilocaine (EMLA) cream Apply a quarter size amount to port site 1 hour prior to chemo. Do  not rub in. Cover with plastic wrap. 08/01/14   Patrici Ranks, MD  Multiple Vitamin (MULTIVITAMIN WITH MINERALS) TABS tablet Take 1 tablet by mouth daily.    Historical Provider, MD  ondansetron (ZOFRAN) 8 MG tablet Take 1 tablet (8 mg total) by mouth every 8 (eight) hours as needed for nausea or vomiting. 08/01/14   Theodis Blaze, MD  OXALIPLATIN IV Inject into the vein. Every 14 days. To begin Monday 08/06/14    Historical Provider, MD  oxyCODONE-acetaminophen (PERCOCET/ROXICET) 5-325 MG per tablet Take 1-2 tablets by mouth every 4 (four) hours as needed for moderate pain. 11/26/14   Baird Cancer, PA-C  polyethylene glycol (MIRALAX / GLYCOLAX) packet Take 17 g by mouth daily as needed for mild constipation. 08/01/14   Theodis Blaze, MD  prochlorperazine (COMPAZINE) 10 MG tablet Take 1 tablet (10 mg total) by mouth every 6 (six) hours as needed for nausea or vomiting. 08/01/14   Patrici Ranks, MD  ranitidine (ZANTAC) 150 MG capsule Take 150 mg by mouth 2 (two) times daily.    Historical Provider, MD  sodium chloride 0.9 % SOLN 150 mL with fluorouracil 5 GM/100ML SOLN 200 mg/m2/day Inject 200 mg/m2/day into the vein. Every 14 days. To begin Monday 08/06/14. To infuse over 46 hours.    Historical Provider, MD  spironolactone (ALDACTONE) 50 MG tablet Take 1 tablet (50 mg total) by mouth daily. 11/26/14 12/10/14  Baird Cancer, PA-C  vitamin C (ASCORBIC ACID) 500 MG tablet Take 500 mg by mouth daily.    Historical Provider, MD   BP 137/87 mmHg  Pulse 80  Temp(Src) 97.8 F (36.6 C) (Oral)  Resp 18  Ht 5\' 8"  (1.727 m)  Wt 195 lb (88.451 kg)  BMI 29.66 kg/m2  SpO2 100% Physical Exam  Constitutional: He is oriented to person, place, and time. He appears well-developed.  HENT:  Head: Normocephalic.  Eyes: Conjunctivae and EOM are normal. No scleral icterus.  Neck: Neck supple. No thyromegaly present.  Cardiovascular: Normal rate and regular rhythm.  Exam reveals no gallop and no friction rub.    No murmur heard. Pulmonary/Chest: No stridor. He has no wheezes. He has no rales. He exhibits no tenderness.  Abdominal: He exhibits no distension. There is tenderness. There is no rebound.  Musculoskeletal: Normal range of motion. He exhibits no edema.  Lymphadenopathy:    He has no cervical adenopathy.  Neurological: He is oriented to person, place, and time. He exhibits normal muscle tone. Coordination normal.  Skin: No rash noted. No erythema.  Psychiatric: He has a normal mood and affect. His behavior is normal.    ED Course  Procedures (including critical care time) Labs Review Labs Reviewed - No data to display  Imaging Review No results found. I have personally reviewed and evaluated these images and lab results as part of my medical decision-making.   EKG Interpretation None      MDM  Final diagnoses:  None    CT scan shows small bowel obstruction. I spoke with the general surgeon Dr. Arnoldo Morale and he suggested having medicine admit. And he will consult on the patient    Milton Ferguson, MD 01/30/15 2106

## 2015-01-30 NOTE — ED Notes (Signed)
Patient transported to CT 

## 2015-01-31 DIAGNOSIS — R1030 Lower abdominal pain, unspecified: Secondary | ICD-10-CM

## 2015-01-31 DIAGNOSIS — C787 Secondary malignant neoplasm of liver and intrahepatic bile duct: Secondary | ICD-10-CM

## 2015-01-31 DIAGNOSIS — C189 Malignant neoplasm of colon, unspecified: Secondary | ICD-10-CM

## 2015-01-31 DIAGNOSIS — K5669 Other intestinal obstruction: Secondary | ICD-10-CM

## 2015-01-31 LAB — CBC
HEMATOCRIT: 35.1 % — AB (ref 39.0–52.0)
HEMOGLOBIN: 12.1 g/dL — AB (ref 13.0–17.0)
MCH: 31.3 pg (ref 26.0–34.0)
MCHC: 34.5 g/dL (ref 30.0–36.0)
MCV: 90.9 fL (ref 78.0–100.0)
Platelets: 204 10*3/uL (ref 150–400)
RBC: 3.86 MIL/uL — ABNORMAL LOW (ref 4.22–5.81)
RDW: 15.5 % (ref 11.5–15.5)
WBC: 5.5 10*3/uL (ref 4.0–10.5)

## 2015-01-31 LAB — BASIC METABOLIC PANEL
ANION GAP: 8 (ref 5–15)
BUN: 13 mg/dL (ref 6–20)
CHLORIDE: 103 mmol/L (ref 101–111)
CO2: 28 mmol/L (ref 22–32)
Calcium: 8.4 mg/dL — ABNORMAL LOW (ref 8.9–10.3)
Creatinine, Ser: 0.82 mg/dL (ref 0.61–1.24)
GFR calc Af Amer: >60 mL/min (ref >60)
GFR calc non Af Amer: >60 mL/min (ref >60)
GLUCOSE: 96 mg/dL (ref 65–99)
POTASSIUM: 3.9 mmol/L (ref 3.5–5.1)
Sodium: 139 mmol/L (ref 135–145)

## 2015-01-31 NOTE — Discharge Summary (Signed)
Physician Discharge Summary  Tom [name] HBZ:[id number] DOB: 07/15/1965 DOA: 01/30/2015  PCP: [name], MD  Admit date: 01/30/2015 Discharge date: 01/31/2015  Time spent:  35 minutes  Recommendations for Outpatient Follow-up:  1. 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  Diet recommendation: cardiac healthy diet.   Filed Weights   01/30/15 1549 01/30/15 2243  Weight: 88.451 kg (195 lb) 86.093 kg (189 lb 12.8 oz)    History of present illness:  Patient was admitted by Dr Arnoldo Morale on Jan 30, 2015 for PSBO.  As per her H and P:  " Tom [name] is a 49 y.o. male with a history of Metastatic Adenocarcinoma of the Colon to the Lungs and Liver Dx 07/2014 on Chemo Rx who presents to the ED with complaints of 10/10 sharp intermittent Mid-ABD Pain since last night. He reports nausea but no vomiting, and his last BM was 2 days ago. A ct Scan was performed in the ED and revealed an SBO, and General surgery Dr. Aviva Signs was consulted and will see in the AM. He was referred for admission.  Hospital Course: patient was admitted into the hospital, as his abdominal CT showed partial small bowel obstruction.  His liver masses were smaller than previous with chemotherapy. His potassium was normal.   He had a bowel movement and felt markedly better.  He no longer had any abdominal pain.  He was seen in consultation with surgery, and Dr Arnoldo Morale has recommended that he be discharged, and to follow up with his oncologist, Dr Whitney Muse.  He is anxious to go home, and is stable for discharge.    Consultations:  Dr Arnoldo Morale of surgery.   Discharge Exam: Filed Vitals:   01/31/15 0601  BP: 104/68  Pulse: 69  Temp: 98.3 F (36.8 C)  Resp: 18    Discharge  Instructions   Discharge Instructions    Diet - low sodium heart healthy    Complete by:  As directed           Current Discharge Medication List    CONTINUE these medications which have NOT CHANGED   Details  docusate sodium (COLACE) 100 MG capsule Take 1 capsule (100 mg total) by mouth 2 (two) times daily. Qty: 60 capsule, Refills: 0    ferrous sulfate 325 (65 FE) MG tablet Take 1 tablet (325 mg total) by mouth 2 (two) times daily. Qty: 60 tablet, Refills: 0   Associated Diagnoses: Microcytic anemia    Multiple Vitamin (MULTIVITAMIN WITH MINERALS) TABS tablet Take 1 tablet by mouth daily.    oxyCODONE-acetaminophen (PERCOCET/ROXICET) 5-325 MG per tablet Take 1-2 tablets by mouth every 4 (four) hours as needed for moderate pain. 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 Qty: 14 each, Refills: 0      STOP taking these medications     capecitabine (XELODA) 500 MG tablet      lidocaine-prilocaine (EMLA) cream      prochlorperazine (COMPAZINE) 10 MG tablet      spironolactone (ALDACTONE) 50 MG tablet      vitamin C (ASCORBIC ACID) 500 MG tablet  leucovorin in dextrose 5 % 250 mL      ondansetron (ZOFRAN) 8 MG tablet      OXALIPLATIN IV      sodium chloride 0.9 % SOLN 150 mL with fluorouracil 5 GM/100ML SOLN 200 mg/m2/day        No Known Allergies    The results of significant diagnostics from this hospitalization (including imaging, microbiology, ancillary and laboratory) are listed below for reference.    Significant Diagnostic Studies: Ct Abdomen Pelvis W Contrast  01/30/2015   CLINICAL DATA:  Lower abdominal pain since last night. History of colon cancer with metastatic disease to liver and lungs.  EXAM: CT ABDOMEN AND PELVIS WITH CONTRAST  TECHNIQUE: Multidetector CT imaging of the abdomen and  pelvis was performed using the standard protocol following bolus administration of intravenous contrast.  CONTRAST:  32mL OMNIPAQUE IOHEXOL 300 MG/ML SOLN, 110mL OMNIPAQUE IOHEXOL 300 MG/ML SOLN  COMPARISON:  10/22/2014 and 07/30/2014  FINDINGS: Lung bases are within normal. Tip of Port-A-Cath seen over the cavoatrial junction.  Abdominal images demonstrate overall decrease in size and number of multiple known liver metastases as the largest lesion is over the superior aspect of the right lobe measuring 4 x 4.8 cm (previously 4.6 x 5.6 cm).  The spleen, pancreas, gallbladder and adrenal glands are within normal. Minimal calcified plaque of the abdominal aorta. Appendix is within normal. Kidneys are normal in size without hydronephrosis or nephrolithiasis. There are several simple left renal cysts unchanged. There is a sub cm left renal cortical hypodensity too small to characterize but likely cysts and unchanged. Ureters are within normal.  Minimal diverticulosis of the colon. Mild fecal retention throughout the colon. Persistent mild wall thickening the sigmoid colon with focal stricturing near the rectosigmoid junction in the midline pelvis without significant change as this is thought to represent the site of patient's original colon cancer. Decrease in size and number of several adjacent mesenteric lymph nodes.  There are multiple fluid-filled mildly dilated small bowel loops involving the distal small bowel with transition point in the right upper pelvis involving the ileum immediately adjacent to the focal malignant stricturing of the rectosigmoid junction described above. The terminal ileum distal to this point is within normal.  Remaining pelvic images demonstrate the bladder, prostate and lower rectum to be within normal.  There are degenerative changes of the spine and minimal degenerate change of the hips.  IMPRESSION: Evidence of distal small bowel obstruction with transition point over the ileum in the  right upper pelvis immediately adjacent/abutting the known malignant stricturing of the sigmoid colon.  There is wall thickening of the sigmoid colon with malignant stricturing at the site of patient's known colon cancer without significant change from the recent prior exam. Overall decreased in size in number adjacent mesenteric lymph nodes. Overall decreased in size and number of multiple liver metastases.  Simple left renal cyst. Sub cm left renal cortical hypodensity too small to characterize but likely a cyst and unchanged.  Mild diverticulosis of the colon.   Electronically Signed   By: Marin Olp M.D.   On: 01/30/2015 18:25    Microbiology: No results found for this or any previous visit (from the past 240 hour(s)).   Labs: Basic Metabolic Panel:  Recent Labs Lab 01/30/15 1700 01/31/15 0528  NA 134* 139  K 4.4 3.9  CL 101 103  CO2 25 28  GLUCOSE 116* 96  BUN 13 13  CREATININE 0.78 0.82  CALCIUM 8.9 8.4*   Liver  Function Tests:  Recent Labs Lab 01/30/15 1700  AST 28  ALT 23  ALKPHOS 105  BILITOT 0.9  PROT 7.4  ALBUMIN 3.8    Recent Labs Lab 01/30/15 1700  LIPASE 19*   No results for input(s): AMMONIA in the last 168 hours. CBC:  Recent Labs Lab 01/30/15 1700 01/31/15 0528  WBC 8.1 5.5  NEUTROABS 5.8  --   HGB 13.3 12.1*  HCT 38.1* 35.1*  MCV 90.1 90.9  PLT 211 204     Signed:  Cassius Cullinane  [facility] 01/31/2015, 10:09 AM

## 2015-01-31 NOTE — Consult Note (Signed)
Reason for Consult: Small bowel obstruction Referring Physician: Hospitalist  Tom [name] is an 49 y.o. male.  HPI: Patient is a 49 year old white male who has been undergoing chemotherapy for metastatic colon carcinoma. His primary is in the sigmoid colon region. Over the past 24 hours, he began experiencing upper abdominal pain and fullness. CT scan of the abdomen revealed a partial small bowel obstruction. Of note was the fact that his liver metastasis have decreased in size. He was admitted to the hospital for further evaluation treatment. Overnight, he had a large bowel movement and he feels much better. He would like to be discharged.  Past Medical History  Diagnosis Date  . 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 Esophagogastroduodenoscopy N/A 07/30/2014    Procedure: ESOPHAGOGASTRODUODENOSCOPY (EGD);  Surgeon: Inda Castle, MD;  Location: [facility];  Service: Endoscopy;  Laterality: N/A;  . Colonoscopy N/A 07/30/2014    Procedure: COLONOSCOPY;  Surgeon: Inda Castle, MD;  Location: [facility];  Service: Endoscopy;  Laterality: N/A;  . Portacath placement Right 08/01/14  . Portacath placement N/A 08/01/2014    Procedure: INSERTION RIGHT IJ PORT-A-CATH WITH ULTRASOUND AND FLUORO;  Surgeon: Gayland Curry, MD;  Location: [facility];  Service: General;  Laterality: N/A;  . Operative ultrasound N/A 08/01/2014    Procedure: OPERATIVE ULTRASOUND;  Surgeon: Gayland Curry, MD;  Location: [facility];  Service: General;  Laterality: N/A;    Family History  Problem Relation Age of Onset  . Cancer Mother     Deceased at 63 years old from metastatic colon cancer  . Kidney failure Father   . Heart disease Father     Deceased in 40-50's  . Colon polyps Brother   . Cancer Maternal Aunt     Social History:  reports that he has never smoked. He quit smokeless tobacco use about 25 years ago. His smokeless tobacco use  included Chew. He reports that he does not drink alcohol or use illicit drugs.  Allergies: No Known Allergies  Medications: I have reviewed the patient's current medications.  Results for orders placed or performed during the hospital encounter of 01/30/15 (from the past 48 hour(s))  CBC with Differential/Platelet     Status: Abnormal   Collection Time: 01/30/15  5:00 PM  Result Value Ref Range   WBC 8.1 4.0 - 10.5 K/uL   RBC 4.23 4.22 - 5.81 MIL/uL   Hemoglobin 13.3 13.0 - 17.0 g/dL   HCT 38.1 (L) 39.0 - 52.0 %   MCV 90.1 78.0 - 100.0 fL   MCH 31.4 26.0 - 34.0 pg   MCHC 34.9 30.0 - 36.0 g/dL   RDW 15.3 11.5 - 15.5 %   Platelets 211 150 - 400 K/uL   Neutrophils Relative % 71 43 - 77 %   Neutro Abs 5.8 1.7 - 7.7 K/uL   Lymphocytes Relative 19 12 - 46 %   Lymphs Abs 1.5 0.7 - 4.0 K/uL   Monocytes Relative 9 3 - 12 %   Monocytes Absolute 0.7 0.1 - 1.0 K/uL   Eosinophils Relative 1 0 - 5 %   Eosinophils Absolute 0.1 0.0 - 0.7 K/uL   Basophils Relative 0 0 - 1 %   Basophils Absolute 0.0 0.0 - 0.1 K/uL  Comprehensive metabolic panel     Status: Abnormal   Collection Time: 01/30/15  5:00 PM  Result Value Ref Range   Sodium 134 (L)  135 - 145 mmol/L   Potassium 4.4 3.5 - 5.1 mmol/L   Chloride 101 101 - 111 mmol/L   CO2 25 22 - 32 mmol/L   Glucose, Bld 116 (H) 65 - 99 mg/dL   BUN 13 6 - 20 mg/dL   Creatinine, Ser 0.78 0.61 - 1.24 mg/dL   Calcium 8.9 8.9 - 10.3 mg/dL   Total Protein 7.4 6.5 - 8.1 g/dL   Albumin 3.8 3.5 - 5.0 g/dL   AST 28 15 - 41 U/L   ALT 23 17 - 63 U/L   Alkaline Phosphatase 105 38 - 126 U/L   Total Bilirubin 0.9 0.3 - 1.2 mg/dL   GFR calc non Af Amer >60 >60 mL/min   GFR calc Af Amer >60 >60 mL/min    Comment: (NOTE) The eGFR has been calculated using the CKD EPI equation. This calculation has not been validated in all clinical situations. eGFR's persistently <60 mL/min signify possible Chronic Kidney Disease.    Anion gap 8 5 - 15  Lipase, blood      Status: Abnormal   Collection Time: 01/30/15  5:00 PM  Result Value Ref Range   Lipase 19 (L) 22 - 51 U/L  Basic metabolic panel     Status: Abnormal   Collection Time: 01/31/15  5:28 AM  Result Value Ref Range   Sodium 139 135 - 145 mmol/L   Potassium 3.9 3.5 - 5.1 mmol/L   Chloride 103 101 - 111 mmol/L   CO2 28 22 - 32 mmol/L   Glucose, Bld 96 65 - 99 mg/dL   BUN 13 6 - 20 mg/dL   Creatinine, Ser 0.82 0.61 - 1.24 mg/dL   Calcium 8.4 (L) 8.9 - 10.3 mg/dL   GFR calc non Af Amer >60 >60 mL/min   GFR calc Af Amer >60 >60 mL/min    Comment: (NOTE) The eGFR has been calculated using the CKD EPI equation. This calculation has not been validated in all clinical situations. eGFR's persistently <60 mL/min signify possible Chronic Kidney Disease.    Anion gap 8 5 - 15  CBC     Status: Abnormal   Collection Time: 01/31/15  5:28 AM  Result Value Ref Range   WBC 5.5 4.0 - 10.5 K/uL   RBC 3.86 (L) 4.22 - 5.81 MIL/uL   Hemoglobin 12.1 (L) 13.0 - 17.0 g/dL   HCT 35.1 (L) 39.0 - 52.0 %   MCV 90.9 78.0 - 100.0 fL   MCH 31.3 26.0 - 34.0 pg   MCHC 34.5 30.0 - 36.0 g/dL   RDW 15.5 11.5 - 15.5 %   Platelets 204 150 - 400 K/uL    Ct Abdomen Pelvis W Contrast  01/30/2015   CLINICAL DATA:  Lower abdominal pain since last night. History of colon cancer with metastatic disease to liver and lungs.  EXAM: CT ABDOMEN AND PELVIS WITH CONTRAST  TECHNIQUE: Multidetector CT imaging of the abdomen and pelvis was performed using the standard protocol following bolus administration of intravenous contrast.  CONTRAST:  58m OMNIPAQUE IOHEXOL 300 MG/ML SOLN, 1072mOMNIPAQUE IOHEXOL 300 MG/ML SOLN  COMPARISON:  10/22/2014 and 07/30/2014  FINDINGS: Lung bases are within normal. Tip of Port-A-Cath seen over the cavoatrial junction.  Abdominal images demonstrate overall decrease in size and number of multiple known liver metastases as the largest lesion is over the superior aspect of the right lobe measuring 4 x 4.8 cm  (previously 4.6 x 5.6 cm).  The spleen, pancreas, gallbladder and adrenal  glands are within normal. Minimal calcified plaque of the abdominal aorta. Appendix is within normal. Kidneys are normal in size without hydronephrosis or nephrolithiasis. There are several simple left renal cysts unchanged. There is a sub cm left renal cortical hypodensity too small to characterize but likely cysts and unchanged. Ureters are within normal.  Minimal diverticulosis of the colon. Mild fecal retention throughout the colon. Persistent mild wall thickening the sigmoid colon with focal stricturing near the rectosigmoid junction in the midline pelvis without significant change as this is thought to represent the site of patient's original colon cancer. Decrease in size and number of several adjacent mesenteric lymph nodes.  There are multiple fluid-filled mildly dilated small bowel loops involving the distal small bowel with transition point in the right upper pelvis involving the ileum immediately adjacent to the focal malignant stricturing of the rectosigmoid junction described above. The terminal ileum distal to this point is within normal.  Remaining pelvic images demonstrate the bladder, prostate and lower rectum to be within normal.  There are degenerative changes of the spine and minimal degenerate change of the hips.  IMPRESSION: Evidence of distal small bowel obstruction with transition point over the ileum in the right upper pelvis immediately adjacent/abutting the known malignant stricturing of the sigmoid colon.  There is wall thickening of the sigmoid colon with malignant stricturing at the site of patient's known colon cancer without significant change from the recent prior exam. Overall decreased in size in number adjacent mesenteric lymph nodes. Overall decreased in size and number of multiple liver metastases.  Simple left renal cyst. Sub cm left renal cortical hypodensity too small to characterize but likely a cyst  and unchanged.  Mild diverticulosis of the colon.   Electronically Signed   By: Marin Olp M.D.   On: 01/30/2015 18:25    ROS: See chart Blood pressure 104/68, pulse 69, temperature 98.3 F (36.8 C), temperature source Oral, resp. rate 18, height 5' 8"  (1.727 m), weight 86.093 kg (189 lb 12.8 oz), SpO2 99 %. Physical Exam: Pleasant white male in no acute distress. Abdomen is soft, nontender, nondistended. No rigidity is noted.  Assessment/Plan: Impression: Partial small bowel obstruction, resolved. Metastatic colon cancer to the liver which is improving with chemotherapy. No need for acute surgical intervention at this time. Plan: Discharge patient today. He was given instructions should this return. He is to follow-up with oncology as previously scheduled.  Refoel Palladino A 01/31/2015, 8:58 AM

## 2015-01-31 NOTE — Progress Notes (Signed)
1058 d/c instructions and paperwork given to patient and patient's family. IV removed from LEFT AC, intact, no s/s of infection noted, patient tolerated well w/no c/o pain or discomfort noted. Patient verified having all of his belongings upon d/c.

## 2015-01-31 NOTE — Progress Notes (Signed)
Patient states that he is feeling 100% better.  He has not had any pain since 0215 and has had a very large formed bowel movement this morning.

## 2015-01-31 NOTE — Care Management Note (Signed)
Case Management Note  Patient Details  Name: Tom [name] MRN: [id number] Date of Birth: 09/04/1965   Expected Discharge Date:  02/01/15               Expected Discharge Plan:  Home/Self Care  In-House Referral:  NA  Discharge planning Services  CM Consult  Post Acute Care Choice:  NA Choice offered to:  NA  DME Arranged:    DME Agency:     HH Arranged:    [place] Agency:     Status of Service:  Completed, signed off  Medicare Important Message Given:    Date Medicare IM Given:    Medicare IM give by:    Date Additional Medicare IM Given:    Additional Medicare Important Message give by:     If discussed at [place] of Stay Meetings, dates discussed:    Additional Comments: Pt is from home, lives with wife and is independent with ADL's. Pt has no HH services or DME's. Pt plans to DC today with self care, no CM needs.  Sherald Barge, RN 01/31/2015, 10:44 AM

## 2015-02-05 ENCOUNTER — Encounter (HOSPITAL_BASED_OUTPATIENT_CLINIC_OR_DEPARTMENT_OTHER): Payer: Medicaid Other

## 2015-02-05 ENCOUNTER — Encounter (HOSPITAL_COMMUNITY): Payer: Self-pay | Admitting: Oncology

## 2015-02-05 ENCOUNTER — Encounter (HOSPITAL_COMMUNITY): Payer: Medicaid Other | Attending: Hematology & Oncology | Admitting: Oncology

## 2015-02-05 VITALS — BP 112/68 | HR 58 | Temp 98.3°F | Resp 22 | Wt 192.5 lb

## 2015-02-05 DIAGNOSIS — C189 Malignant neoplasm of colon, unspecified: Secondary | ICD-10-CM | POA: Diagnosis present

## 2015-02-05 DIAGNOSIS — C787 Secondary malignant neoplasm of liver and intrahepatic bile duct: Secondary | ICD-10-CM | POA: Diagnosis not present

## 2015-02-05 DIAGNOSIS — C187 Malignant neoplasm of sigmoid colon: Secondary | ICD-10-CM | POA: Insufficient documentation

## 2015-02-05 LAB — CBC WITH DIFFERENTIAL/PLATELET
Basophils Absolute: 0 10*3/uL (ref 0.0–0.1)
Basophils Relative: 0 % (ref 0–1)
Eosinophils Absolute: 0.2 10*3/uL (ref 0.0–0.7)
Eosinophils Relative: 3 % (ref 0–5)
HEMATOCRIT: 36.2 % — AB (ref 39.0–52.0)
HEMOGLOBIN: 12.4 g/dL — AB (ref 13.0–17.0)
LYMPHS ABS: 1.6 10*3/uL (ref 0.7–4.0)
LYMPHS PCT: 27 % (ref 12–46)
MCH: 30.9 pg (ref 26.0–34.0)
MCHC: 34.3 g/dL (ref 30.0–36.0)
MCV: 90.3 fL (ref 78.0–100.0)
Monocytes Absolute: 0.9 10*3/uL (ref 0.1–1.0)
Monocytes Relative: 15 % — ABNORMAL HIGH (ref 3–12)
NEUTROS ABS: 3.1 10*3/uL (ref 1.7–7.7)
NEUTROS PCT: 55 % (ref 43–77)
Platelets: 170 10*3/uL (ref 150–400)
RBC: 4.01 MIL/uL — AB (ref 4.22–5.81)
RDW: 15.6 % — ABNORMAL HIGH (ref 11.5–15.5)
WBC: 5.8 10*3/uL (ref 4.0–10.5)

## 2015-02-05 LAB — COMPREHENSIVE METABOLIC PANEL
ALK PHOS: 101 U/L (ref 38–126)
ALT: 16 U/L — AB (ref 17–63)
AST: 21 U/L (ref 15–41)
Albumin: 3.5 g/dL (ref 3.5–5.0)
Anion gap: 5 (ref 5–15)
BUN: 12 mg/dL (ref 6–20)
CALCIUM: 8.9 mg/dL (ref 8.9–10.3)
CO2: 29 mmol/L (ref 22–32)
CREATININE: 0.75 mg/dL (ref 0.61–1.24)
Chloride: 104 mmol/L (ref 101–111)
Glucose, Bld: 97 mg/dL (ref 65–99)
Potassium: 4.3 mmol/L (ref 3.5–5.1)
Sodium: 138 mmol/L (ref 135–145)
Total Bilirubin: 0.4 mg/dL (ref 0.3–1.2)
Total Protein: 6.7 g/dL (ref 6.5–8.1)

## 2015-02-05 NOTE — Progress Notes (Signed)
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  S/P 10 cycles of FOLFOX + Avastin (on hold due to proteinuria beginning on 10/01/2014) finishing on 12/10/2014.  Change in therapy secondary to response, increasing PN, and continued/worsening proteinuria.  INTERVAL HISTORY: Tom [name] 49 y.o. 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 Appearance is most likely to represent diffuse hepatic metastasis   07/30/2014 Initial Diagnosis Colon, biopsy, sigmoid - ADENOCARCINOMA.   07/31/2014 Tumor Marker CEA- 48.0   07/31/2014 Imaging Ct abd/pelvis- concerning for primary colorectal neoplasm in the mid to distal sigmoid colon, with lymphadenopathy in the sigmoid mesocolon, ileocolic mesentery, and retroperitoneum, as well as widespread metastatic disease to the liver   08/01/2014 Imaging CT chest- Pathologic thoracic, right hilar, and infrahilar adenopathy associated with a masslike region of possible consolidation in the right middle lobe, surrounding nodularity, and some scattered bilateral pulmonary nodules.   08/06/2014 - 12/10/2014 Chemotherapy FOLFOX.  Avastin added for cycle 2.  S/P 10 cycles.  Change to maintenance therapy secondary to positive response to therapy, increasing PN, and progressive proteinuria.   10/08/2014 Adverse Reaction Avastin induced proteinuria >/= 2 gm Avastin on hold   10/15/2014 Treatment  Plan Change Avastin held for proteinuria.   10/22/2014 Imaging Ct CAP- 1. Interval decrease and soft tissue adjacent to the mid sigmoid colon with decreasing sigmoid colon wall thickening. The  leocolic, mesenteric, perirectal, and mesocolonic lymphadenopathy has decreased in the interval.   10/30/2014 Survivorship Genetic Counseling. Genetic testing was normal, and did not reveal a deleterious mutation in these genes. The test report will be scanned into EPIC and will be located under the Media tab.    11/26/2014 Treatment Plan Change Oxaliplatin reduced x 25% due to PN.   12/24/2014 Treatment Plan Change Change to maintanence therapy.   01/09/2015 -  Chemotherapy Xeloda 2500 mg BID, 7 days on and 7 days off.   01/30/2015 Imaging CT in ED- Evidence of distal small bowel obstruction with transition point over the ileum in the right upper pelvis immediately adjacent/abutting the known malignant stricturing of the sigmoid colon. There is wall thickening of the sigmoid colon wi...   01/30/2015 - 01/31/2015 Hospital Admission SBO (small bowel obstruction)    I personally reviewed and went over laboratory results with the patient.  The results are noted within this dictation.  These will be updated today.  He reports that nephrology has deemed his proteinuria is from systemic chemotherapy.  He notes that his urine protein is 1/2 of what it was.    He continues to tolerate Xeloda well.  He reports that today he started his 7 days on.  He denies any tolerability issues with Xeloda at this time.   He notes rectal numbness/tingling.  He notes that this is new.  He denies any changes in his bowel habits.  He denies any blood in his stool.  I am unable to think of an etiology of this  symptom at this time.  Will follow along.  I have reviewed his chart and recent hospitalization.    Past Medical History  Diagnosis Date  . 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     has No Known Allergies.  Current Outpatient Prescriptions on File Prior to Visit  Medication Sig Dispense Refill  . docusate sodium (COLACE) 100 MG capsule Take 1 capsule (100 mg total) by mouth 2 (two) times daily. (Patient taking differently: Take 100 mg by mouth daily. 3 in the am and 2 at night) 60 capsule 0  . ferrous sulfate 325 (65 FE) MG tablet Take 1 tablet (325 mg total) by mouth 2 (two) times daily. 60 tablet 0  . Multiple Vitamin (MULTIVITAMIN WITH MINERALS) TABS tablet Take 1 tablet by mouth daily.    [name] oxyCODONE-acetaminophen (PERCOCET/ROXICET) 5-325 MG per tablet Take 1-2 tablets by mouth every 4 (four) hours as needed for moderate pain. 30 tablet 0  . polyethylene glycol (MIRALAX / GLYCOLAX) packet Take 17 g by mouth daily as needed for mild constipation. (Patient not taking: Reported on 02/05/2015) 14 each 0   Current Facility-Administered Medications on File Prior to Visit  Medication Dose Route Frequency Provider Last Rate Last Dose  . sodium chloride 0.9 % injection 10 mL  10 mL Intravenous PRN Patrici Ranks, MD   10 mL at 08/08/14 1237    Past Surgical History  Procedure Laterality Date  . Esophagogastroduodenoscopy N/A 07/30/2014    Procedure: ESOPHAGOGASTRODUODENOSCOPY (EGD);  Surgeon: Inda Castle, MD;  Location: [facility];  Service: Endoscopy;  Laterality: N/A;  . Colonoscopy N/A 07/30/2014    Procedure: COLONOSCOPY;  Surgeon: Inda Castle, MD;  Location: [facility];  Service: Endoscopy;  Laterality: N/A;  . Portacath placement Right 08/01/14  . Portacath placement N/A 08/01/2014    Procedure: INSERTION RIGHT IJ PORT-A-CATH WITH ULTRASOUND AND FLUORO;  Surgeon: Gayland Curry, MD;  Location: [facility];  Service: General;  Laterality:  N/A;  . Operative ultrasound N/A 08/01/2014    Procedure: OPERATIVE ULTRASOUND;  Surgeon: Gayland Curry, MD;  Location: [facility];  Service: General;  Laterality: N/A;    Denies any headaches, dizziness, double vision, fevers, chills, night sweats, nausea, vomiting, diarrhea, constipation, chest pain, heart palpitations, shortness of breath, blood in stool, black tarry stool, urinary pain, urinary burning, urinary frequency, hematuria.   PHYSICAL EXAMINATION  ECOG PERFORMANCE STATUS: 1 - Symptomatic but completely ambulatory  Filed Vitals:   02/05/15 1049  BP: 112/68  Pulse: 58  Temp: 98.3 F (36.8 C)  Resp: 22    GENERAL:alert, no distress, well nourished, well developed, comfortable, cooperative, smiling and accompanied by wife SKIN: skin color, texture, turgor are normal, no rashes or significant lesions HEAD: Normocephalic, No masses, lesions, tenderness or abnormalities EYES: normal, PERRLA, EOMI, Conjunctiva are pink and non-injected EARS: External ears normal OROPHARYNX:lips, buccal mucosa, and tongue normal and mucous membranes are moist  NECK: supple, no adenopathy, thyroid normal size, non-tender, without nodularity, no stridor, non-tender, trachea midline LYMPH:  no palpable lymphadenopathy BREAST:not examined LUNGS: clear to auscultation  HEART: regular rate & rhythm, no murmurs and no gallops ABDOMEN:abdomen soft, non-tender and normal bowel sounds BACK: Back symmetric, no curvature., No CVA tenderness EXTREMITIES:less then 2 second capillary refill, no  joint deformities, effusion, or inflammation, no skin discoloration, no cyanosis, B/L LE edema 2+ pitting in ankles and feet.  NEURO: alert & oriented x 3 with fluent speech, no focal motor/sensory deficits, gait normal   LABORATORY DATA: CBC    Component Value Date/Time   WBC 5.8 02/05/2015 1031   RBC 4.01* 02/05/2015 1031   RBC 3.42* 07/28/2014 1440   HGB 12.4* 02/05/2015 1031   HCT 36.2* 02/05/2015 1031   PLT  170 02/05/2015 1031   MCV 90.3 02/05/2015 1031   MCH 30.9 02/05/2015 1031   MCHC 34.3 02/05/2015 1031   RDW 15.6* 02/05/2015 1031   LYMPHSABS 1.6 02/05/2015 1031   MONOABS 0.9 02/05/2015 1031   EOSABS 0.2 02/05/2015 1031   BASOSABS 0.0 02/05/2015 1031      Chemistry      Component Value Date/Time   NA 138 02/05/2015 1031   K 4.3 02/05/2015 1031   CL 104 02/05/2015 1031   CO2 29 02/05/2015 1031   BUN 12 02/05/2015 1031   CREATININE 0.75 02/05/2015 1031      Component Value Date/Time   CALCIUM 8.9 02/05/2015 1031   ALKPHOS 101 02/05/2015 1031   AST 21 02/05/2015 1031   ALT 16* 02/05/2015 1031   BILITOT 0.4 02/05/2015 1031     Lab Results  Component Value Date   CEA 2.1 01/17/2015      ASSESSMENT AND PLAN:  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   He is starting his 7 day on of treatment today.  Oncology history updated.  Genetic Counseling is complete and is negative.  His proteinuria is currently undergoing evaluation and management with nephrology.  He reports that nephrology suspect his proteinuria is secondary to previous chemotherapy and is improving.  Continue Xeloda as planned 2500 mg BID, 7 days on and 7 days off.  He reports numbness and tingling in rectum area.  He denies any changes in bowel habits.  His bowels are back to baseline.  Labs in 2 weeks: CBC diff, CMET, CEA.  Return in 2 weeks for follow-up.    THERAPY PLAN:  Continue with treatment as planned.  All questions were answered. The patient knows to call the clinic with any problems, questions or concerns. We can certainly see the patient much sooner if necessary.  Patient and plan discussed with Dr. Ancil Linsey and she is in agreement with the aforementioned.   This note is electronically signed by: Doy Mince 02/05/2015 5:09 PM

## 2015-02-05 NOTE — Assessment & Plan Note (Signed)
49 year old male with stage IV colon cancer currently on Xeloda 2500 mg BID, 7 days on and 7 days off maintenance secondary to excellent response to FOLFOX therapy, increasing PN, and progressive proteinuria after completing 10 cycles of FOLFOX with last treatment on 12/10/2014.   He is starting his 7 day on of treatment today.  Oncology history updated.  Genetic Counseling is complete and is negative.  His proteinuria is currently undergoing evaluation and management with nephrology.  He reports that nephrology suspect his proteinuria is secondary to previous chemotherapy and is improving.  Continue Xeloda as planned 2500 mg BID, 7 days on and 7 days off.  He reports numbness and tingling in rectum area.  He denies any changes in bowel habits.  His bowels are back to baseline.  Labs in 2 weeks: CBC diff, CMET, CEA.  Return in 2 weeks for follow-up.

## 2015-02-05 NOTE — Patient Instructions (Signed)
[place] at [facility] Discharge Instructions  RECOMMENDATIONS MADE BY THE CONSULTANT AND ANY TEST RESULTS WILL BE SENT TO YOUR REFERRING PHYSICIAN.  Exam done and seen by Tom today. Follow up in two weeks with labs  Thank you for choosing [place] at [facility] to provide your oncology and hematology care.  To afford each patient quality time with our provider, please arrive at least 15 minutes before your scheduled appointment time.    You need to re-schedule your appointment should you arrive 10 or more minutes late.  We strive to give you quality time with our providers, and arriving late affects you and other patients whose appointments are after yours.  Also, if you no show three or more times for appointments you may be dismissed from the clinic at the providers discretion.     Again, thank you for choosing [facility].  Our hope is that these requests will decrease the amount of time that you wait before being seen by our physicians.       _____________________________________________________________  Should you have questions after your visit to [facility], please contact our office at [phone] between the hours of 8:30 a.m. and 4:30 p.m.  Voicemails left after 4:30 p.m. will not be returned until the following business day.  For prescription refill requests, have your pharmacy contact our office.

## 2015-02-05 NOTE — Progress Notes (Unsigned)
Tom [name]'s reason for visit today are for labs as scheduled per MD orders.  Venipuncture performed with a 23 gauge butterfly needle to R Antecubital.  [name] tolerated venipuncture well and without incident; questions were answered and patient was discharged.

## 2015-02-19 ENCOUNTER — Encounter (HOSPITAL_BASED_OUTPATIENT_CLINIC_OR_DEPARTMENT_OTHER): Payer: Medicaid Other | Admitting: Oncology

## 2015-02-19 ENCOUNTER — Encounter (HOSPITAL_COMMUNITY): Payer: Self-pay | Admitting: Oncology

## 2015-02-19 ENCOUNTER — Encounter (HOSPITAL_BASED_OUTPATIENT_CLINIC_OR_DEPARTMENT_OTHER): Payer: Medicaid Other

## 2015-02-19 VITALS — BP 98/70 | HR 75 | Temp 98.3°F | Resp 15 | Wt 197.5 lb

## 2015-02-19 DIAGNOSIS — C7802 Secondary malignant neoplasm of left lung: Secondary | ICD-10-CM

## 2015-02-19 DIAGNOSIS — C787 Secondary malignant neoplasm of liver and intrahepatic bile duct: Secondary | ICD-10-CM | POA: Diagnosis not present

## 2015-02-19 DIAGNOSIS — G629 Polyneuropathy, unspecified: Secondary | ICD-10-CM | POA: Diagnosis not present

## 2015-02-19 DIAGNOSIS — C189 Malignant neoplasm of colon, unspecified: Secondary | ICD-10-CM

## 2015-02-19 DIAGNOSIS — C187 Malignant neoplasm of sigmoid colon: Secondary | ICD-10-CM | POA: Diagnosis not present

## 2015-02-19 DIAGNOSIS — C7801 Secondary malignant neoplasm of right lung: Secondary | ICD-10-CM | POA: Diagnosis not present

## 2015-02-19 LAB — COMPREHENSIVE METABOLIC PANEL
ALBUMIN: 4 g/dL (ref 3.5–5.0)
ALK PHOS: 88 U/L (ref 38–126)
ALT: 23 U/L (ref 17–63)
ANION GAP: 6 (ref 5–15)
AST: 30 U/L (ref 15–41)
BILIRUBIN TOTAL: 0.7 mg/dL (ref 0.3–1.2)
BUN: 18 mg/dL (ref 6–20)
CALCIUM: 8.9 mg/dL (ref 8.9–10.3)
CO2: 26 mmol/L (ref 22–32)
Chloride: 106 mmol/L (ref 101–111)
Creatinine, Ser: 0.93 mg/dL (ref 0.61–1.24)
GLUCOSE: 109 mg/dL — AB (ref 65–99)
POTASSIUM: 4 mmol/L (ref 3.5–5.1)
Sodium: 138 mmol/L (ref 135–145)
TOTAL PROTEIN: 7.3 g/dL (ref 6.5–8.1)

## 2015-02-19 LAB — CBC WITH DIFFERENTIAL/PLATELET
BASOS PCT: 0 %
Basophils Absolute: 0 10*3/uL (ref 0.0–0.1)
EOS ABS: 0.1 10*3/uL (ref 0.0–0.7)
Eosinophils Relative: 2 %
HEMATOCRIT: 37.5 % — AB (ref 39.0–52.0)
HEMOGLOBIN: 13.2 g/dL (ref 13.0–17.0)
LYMPHS ABS: 1.6 10*3/uL (ref 0.7–4.0)
Lymphocytes Relative: 29 %
MCH: 31.9 pg (ref 26.0–34.0)
MCHC: 35.2 g/dL (ref 30.0–36.0)
MCV: 90.6 fL (ref 78.0–100.0)
MONO ABS: 0.6 10*3/uL (ref 0.1–1.0)
MONOS PCT: 10 %
NEUTROS ABS: 3.4 10*3/uL (ref 1.7–7.7)
Neutrophils Relative %: 59 %
Platelets: 196 10*3/uL (ref 150–400)
RBC: 4.14 MIL/uL — ABNORMAL LOW (ref 4.22–5.81)
RDW: 16.1 % — AB (ref 11.5–15.5)
WBC: 5.7 10*3/uL (ref 4.0–10.5)

## 2015-02-19 LAB — URINALYSIS, DIPSTICK ONLY
BILIRUBIN URINE: NEGATIVE
Glucose, UA: NEGATIVE mg/dL
Hgb urine dipstick: NEGATIVE
KETONES UR: NEGATIVE mg/dL
LEUKOCYTES UA: NEGATIVE
NITRITE: NEGATIVE
PH: 6 (ref 5.0–8.0)
PROTEIN: 100 mg/dL — AB
Specific Gravity, Urine: >1.03 — ABNORMAL HIGH (ref 1.005–1.030)
UROBILINOGEN UA: 0.2 mg/dL (ref 0.0–1.0)

## 2015-02-19 MED ORDER — DULOXETINE HCL 30 MG PO CPEP
ORAL_CAPSULE | ORAL | Status: DC
Start: 2015-02-19 — End: 2015-04-09

## 2015-02-19 NOTE — Progress Notes (Signed)
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  S/P 10 cycles of FOLFOX + Avastin (on hold due to proteinuria beginning on 10/01/2014) finishing on 12/10/2014.  Change in therapy secondary to response, increasing PN, and continued/worsening proteinuria.  INTERVAL HISTORY: Tom [name] 49 y.o. 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 Appearance is most likely to represent diffuse hepatic metastasis   07/30/2014 Initial Diagnosis Colon, biopsy, sigmoid - ADENOCARCINOMA.   07/31/2014 Tumor Marker CEA- 48.0   07/31/2014 Imaging Ct abd/pelvis- concerning for primary colorectal neoplasm in the mid to distal sigmoid colon, with lymphadenopathy in the sigmoid mesocolon, ileocolic mesentery, and retroperitoneum, as well as widespread metastatic disease to the liver   08/01/2014 Imaging CT chest- Pathologic thoracic, right hilar, and infrahilar adenopathy associated with a masslike region of possible consolidation in the right middle lobe, surrounding nodularity, and some scattered bilateral pulmonary nodules.   08/06/2014 - 12/10/2014 Chemotherapy FOLFOX.  Avastin added for cycle 2.  S/P 10 cycles.  Change to maintenance therapy secondary to positive response to therapy, increasing PN, and progressive proteinuria.   10/08/2014 Adverse Reaction Avastin induced proteinuria >/= 2 gm  Avastin on hold   10/15/2014 Treatment Plan Change Avastin held for proteinuria.   10/22/2014 Imaging Ct CAP- 1. Interval decrease and soft tissue adjacent to the mid sigmoid colon with decreasing sigmoid colon wall thickening. The  leocolic, mesenteric, perirectal, and mesocolonic lymphadenopathy has decreased in the interval.   10/30/2014 Survivorship Genetic Counseling. Genetic testing was normal, and did not reveal a deleterious mutation in these genes. The test report will be scanned into EPIC and will be located under the Media tab.    11/26/2014 Treatment Plan Change Oxaliplatin reduced x 25% due to PN.   12/24/2014 Treatment Plan Change Change to maintanence therapy.   01/09/2015 -  Chemotherapy Xeloda 2500 mg BID, 7 days on and 7 days off.   01/30/2015 Imaging CT in ED- Evidence of distal small bowel obstruction with transition point over the ileum in the right upper pelvis immediately adjacent/abutting the known malignant stricturing of the sigmoid colon. There is wall thickening of the sigmoid colon wi...   01/30/2015 - 01/31/2015 Hospital Admission SBO (small bowel obstruction)    I personally reviewed and went over laboratory results with the patient.  The results are noted within this dictation.  These will be updated today.  He started his Xeloda on Monday, 9/19.    He continues to tolerate therapy well.  He reports progressive peripheral neuropathy.  It has not interfered with his ability to button his shirt and he is able to tie his shoes.  He denies any falls or dropping items from his hands.  He notes that the sensation is encompasing his hands and feet now and making its way up his LE.    His rectal "numbness" he reported last time has resolved, but has not migrated to his  perineal area.  Other than that sensation, he denies any complaints associated with that.  Unfortunately, he may be losing his Medicaid coverage because he "makes too much money on disability."    Past Medical  History  Diagnosis Date  . 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     has No Known Allergies.  Current Outpatient Prescriptions on File Prior to Visit  Medication Sig Dispense Refill  . docusate sodium (COLACE) 100 MG capsule Take 1 capsule (100 mg total) by mouth 2 (two) times daily. (Patient taking differently: Take 100 mg by mouth daily. 3 in the am and 2 at night) 60 capsule 0  . ferrous sulfate 325 (65 FE) MG tablet Take 1 tablet (325 mg total) by mouth 2 (two) times daily. 60 tablet 0  . Multiple Vitamin (MULTIVITAMIN WITH MINERALS) TABS tablet Take 1 tablet by mouth daily.    [name] oxyCODONE-acetaminophen (PERCOCET/ROXICET) 5-325 MG per tablet Take 1-2 tablets by mouth every 4 (four) hours as needed for moderate pain. 30 tablet 0  . polyethylene glycol (MIRALAX / GLYCOLAX) packet Take 17 g by mouth daily as needed for mild constipation. 14 each 0   Current Facility-Administered Medications on File Prior to Visit  Medication Dose Route Frequency Provider Last Rate Last Dose  . sodium chloride 0.9 % injection 10 mL  10 mL Intravenous PRN Patrici Ranks, MD   10 mL at 08/08/14 1237    Past Surgical History  Procedure Laterality Date  . Esophagogastroduodenoscopy N/A 07/30/2014    Procedure: ESOPHAGOGASTRODUODENOSCOPY (EGD);  Surgeon: Inda Castle, MD;  Location: [facility];  Service: Endoscopy;  Laterality: N/A;  . Colonoscopy N/A 07/30/2014    Procedure: COLONOSCOPY;  Surgeon: Inda Castle, MD;  Location: [facility];  Service: Endoscopy;  Laterality: N/A;  . Portacath placement Right 08/01/14  . Portacath placement N/A 08/01/2014    Procedure: INSERTION RIGHT IJ PORT-A-CATH WITH ULTRASOUND AND FLUORO;   Surgeon: Gayland Curry, MD;  Location: [facility];  Service: General;  Laterality: N/A;  . Operative ultrasound N/A 08/01/2014    Procedure: OPERATIVE ULTRASOUND;  Surgeon: Gayland Curry, MD;  Location: [facility];  Service: General;  Laterality: N/A;    Denies any headaches, dizziness, double vision, fevers, chills, night sweats, nausea, vomiting, diarrhea, constipation, chest pain, heart palpitations, shortness of breath, blood in stool, black tarry stool, urinary pain, urinary burning, urinary frequency, hematuria.   PHYSICAL EXAMINATION  ECOG PERFORMANCE STATUS: 1 - Symptomatic but completely ambulatory  Filed Vitals:   02/19/15 1031  BP: 98/70  Pulse: 75  Temp: 98.3 F (36.8 C)  Resp: 15    GENERAL:alert, no distress, well nourished, well developed, comfortable, cooperative, smiling and accompanied by wife SKIN: skin color, texture, turgor are normal, no rashes or significant lesions HEAD: Normocephalic, No masses, lesions, tenderness or abnormalities EYES: normal, PERRLA, EOMI, Conjunctiva are pink and non-injected EARS: External ears normal OROPHARYNX:lips, buccal mucosa, and tongue normal and mucous membranes are moist  NECK: supple, no adenopathy, thyroid normal size, non-tender, without nodularity, no stridor, non-tender, trachea midline LYMPH:  no palpable lymphadenopathy BREAST:not examined LUNGS: clear to auscultation  HEART: regular rate & rhythm, no murmurs and no gallops ABDOMEN:abdomen soft, non-tender and normal bowel sounds BACK: Back symmetric, no curvature., No CVA  tenderness EXTREMITIES:less then 2 second capillary refill, no joint deformities, effusion, or inflammation, no skin discoloration, no cyanosis, B/L LE edema 2+ pitting in ankles and feet.  NEURO: alert & oriented x 3 with fluent speech, no focal motor/sensory deficits, gait normal   LABORATORY DATA: CBC    Component Value Date/Time   WBC 5.7 02/19/2015 1021   RBC 4.14* 02/19/2015 1021   RBC 3.42*  07/28/2014 1440   HGB 13.2 02/19/2015 1021   HCT 37.5* 02/19/2015 1021   PLT 196 02/19/2015 1021   MCV 90.6 02/19/2015 1021   MCH 31.9 02/19/2015 1021   MCHC 35.2 02/19/2015 1021   RDW 16.1* 02/19/2015 1021   LYMPHSABS 1.6 02/19/2015 1021   MONOABS 0.6 02/19/2015 1021   EOSABS 0.1 02/19/2015 1021   BASOSABS 0.0 02/19/2015 1021      Chemistry      Component Value Date/Time   NA 138 02/19/2015 1021   K 4.0 02/19/2015 1021   CL 106 02/19/2015 1021   CO2 26 02/19/2015 1021   BUN 18 02/19/2015 1021   CREATININE 0.93 02/19/2015 1021      Component Value Date/Time   CALCIUM 8.9 02/19/2015 1021   ALKPHOS 88 02/19/2015 1021   AST 30 02/19/2015 1021   ALT 23 02/19/2015 1021   BILITOT 0.7 02/19/2015 1021     Lab Results  Component Value Date   CEA 2.1 01/17/2015      ASSESSMENT AND PLAN:  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   Genetic Counseling is complete and is negative.  His proteinuria is currently undergoing evaluation and management with nephrology.  He reports that nephrology suspect his proteinuria is secondary to previous chemotherapy and is improving.  We will try to get notes as we would like to re-challenge Tom [name] with Avastin in the future if able.  Continue Xeloda as planned 2500 mg BID, 7 days on and 7 days off.  He started his 7 days on on 9/19  He reported numbness and tingling in rectum area.  He denies any changes in bowel habits.  His bowels are back to baseline.  Today, he notes that the sensation in his anorectal area has resolved, but now it has migrated to his perineal area.  Labs today: CBC diff, CMET, CEA.  He will be sent home with UA to evaluate proteinuria.  If  he is unable to complete, will perform on his next follow-up visit.  Labs in 2 weeks: CBC diff, CMET  Return in 2 weeks for follow-up.  Will try to get nephrology's last dictation for our review.  At some point, if able, we would like to re-challenge Tom [name] with Avastin.  Due to his progressive peripheral neuropathy, will try Cymbalta 30 mg x 5 days, then 60 mg daily.  Rx is printed for the patient.  I have reviewed the risk of Oxaliplatin-induced PN.  PN is not interfering with his QOL at this time.    THERAPY PLAN:  Continue with treatment as planned.  All questions were answered. The patient knows to call the clinic with any problems, questions or concerns. We can certainly see the patient much sooner if necessary.  Patient and plan discussed with Dr. Ancil Linsey and she is in  agreement with the aforementioned.   This note is electronically signed by: Doy Mince 02/19/2015 11:27 AM

## 2015-02-19 NOTE — Progress Notes (Signed)
LABS DRAWN

## 2015-02-19 NOTE — Assessment & Plan Note (Addendum)
49 year old male with stage IV colon cancer currently on Xeloda 2500 mg BID, 7 days on and 7 days off maintenance secondary to excellent response to FOLFOX therapy, increasing PN, and progressive proteinuria after completing 10 cycles of FOLFOX with last treatment on 12/10/2014.   Genetic Counseling is complete and is negative.  His proteinuria is currently undergoing evaluation and management with nephrology.  He reports that nephrology suspect his proteinuria is secondary to previous chemotherapy and is improving.  We will try to get notes as we would like to re-challenge Tom [name] with Avastin in the future if able.  Continue Xeloda as planned 2500 mg BID, 7 days on and 7 days off.  He started his 7 days on on 9/19  He reported numbness and tingling in rectum area.  He denies any changes in bowel habits.  His bowels are back to baseline.  Today, he notes that the sensation in his anorectal area has resolved, but now it has migrated to his perineal area.  Labs today: CBC diff, CMET, CEA.  He will be sent home with UA to evaluate proteinuria.  If he is unable to complete, will perform on his next follow-up visit.  Labs in 2 weeks: CBC diff, CMET  Return in 2 weeks for follow-up.  Will try to get nephrology's last dictation for our review.  At some point, if able, we would like to re-challenge Tom [name] with Avastin.  Due to his progressive peripheral neuropathy, will try Cymbalta 30 mg x 5 days, then 60 mg daily.  Rx is printed for the patient.  I have reviewed the risk of Oxaliplatin-induced PN.  PN is not interfering with his QOL at this time.

## 2015-02-19 NOTE — Patient Instructions (Signed)
[place] at [facility] Discharge Instructions  RECOMMENDATIONS MADE BY THE CONSULTANT AND ANY TEST RESULTS WILL BE SENT TO YOUR REFERRING PHYSICIAN.  Exam and discussion by Robynn Pane, PA-C. Cymbalta -  take as directed for peripheral neuropathy. Call with any concerns. (nausea, vomiting, diarrhea or other issues)  Follow-up in 2 weeks with labs and office visit.  Thank you for choosing [place] at [facility] to provide your oncology and hematology care.  To afford each patient quality time with our Numan Zylstra, please arrive at least 15 minutes before your scheduled appointment time.    You need to re-schedule your appointment should you arrive 10 or more minutes late.  We strive to give you quality time with our providers, and arriving late affects you and other patients whose appointments are after yours.  Also, if you no show three or more times for appointments you may be dismissed from the clinic at the providers discretion.     Again, thank you for choosing [facility].  Our hope is that these requests will decrease the amount of time that you wait before being seen by our physicians.       _____________________________________________________________  Should you have questions after your visit to [facility], please contact our office at [phone] between the hours of 8:30 a.m. and 4:30 p.m.  Voicemails left after 4:30 p.m. will not be returned until the following business day.  For prescription refill requests, have your pharmacy contact our office.

## 2015-02-19 NOTE — Addendum Note (Signed)
Addended by: Mellissa Kohut on: 02/19/2015 03:35 PM   Modules accepted: Orders

## 2015-02-20 LAB — CEA: CEA: 2.8 ng/mL (ref 0.0–4.7)

## 2015-03-05 ENCOUNTER — Ambulatory Visit (HOSPITAL_COMMUNITY): Payer: Medicaid Other | Admitting: Hematology & Oncology

## 2015-03-05 ENCOUNTER — Encounter (HOSPITAL_COMMUNITY): Payer: Medicaid Other

## 2015-03-05 ENCOUNTER — Other Ambulatory Visit (HOSPITAL_COMMUNITY): Payer: Medicaid Other

## 2015-03-05 ENCOUNTER — Ambulatory Visit (HOSPITAL_COMMUNITY): Payer: Medicaid Other | Admitting: Oncology

## 2015-03-06 NOTE — Progress Notes (Signed)
This encounter was created in error - please disregard.

## 2015-03-14 ENCOUNTER — Encounter (HOSPITAL_COMMUNITY): Payer: Medicaid Other

## 2015-03-26 ENCOUNTER — Encounter (HOSPITAL_COMMUNITY): Payer: Medicaid Other

## 2015-03-26 ENCOUNTER — Encounter (HOSPITAL_COMMUNITY): Payer: Medicaid Other | Attending: Hematology & Oncology | Admitting: Hematology & Oncology

## 2015-03-26 ENCOUNTER — Encounter (HOSPITAL_COMMUNITY): Payer: Self-pay | Admitting: Hematology & Oncology

## 2015-03-26 VITALS — BP 100/65 | HR 65 | Temp 98.2°F | Resp 16 | Wt 198.1 lb

## 2015-03-26 DIAGNOSIS — C787 Secondary malignant neoplasm of liver and intrahepatic bile duct: Secondary | ICD-10-CM | POA: Diagnosis not present

## 2015-03-26 DIAGNOSIS — Z0000 Encounter for general adult medical examination without abnormal findings: Secondary | ICD-10-CM

## 2015-03-26 DIAGNOSIS — C7802 Secondary malignant neoplasm of left lung: Secondary | ICD-10-CM

## 2015-03-26 DIAGNOSIS — C187 Malignant neoplasm of sigmoid colon: Secondary | ICD-10-CM | POA: Diagnosis present

## 2015-03-26 DIAGNOSIS — C189 Malignant neoplasm of colon, unspecified: Secondary | ICD-10-CM

## 2015-03-26 DIAGNOSIS — R808 Other proteinuria: Secondary | ICD-10-CM

## 2015-03-26 DIAGNOSIS — C7801 Secondary malignant neoplasm of right lung: Secondary | ICD-10-CM

## 2015-03-26 DIAGNOSIS — Z23 Encounter for immunization: Secondary | ICD-10-CM

## 2015-03-26 LAB — CBC WITH DIFFERENTIAL/PLATELET
Basophils Absolute: 0 10*3/uL (ref 0.0–0.1)
Basophils Relative: 0 %
Eosinophils Absolute: 0.1 10*3/uL (ref 0.0–0.7)
Eosinophils Relative: 2 %
HCT: 33.8 % — ABNORMAL LOW (ref 39.0–52.0)
Hemoglobin: 11.9 g/dL — ABNORMAL LOW (ref 13.0–17.0)
Lymphocytes Relative: 29 %
Lymphs Abs: 1.3 10*3/uL (ref 0.7–4.0)
MCH: 31.9 pg (ref 26.0–34.0)
MCHC: 35.2 g/dL (ref 30.0–36.0)
MCV: 90.6 fL (ref 78.0–100.0)
Monocytes Absolute: 0.4 10*3/uL (ref 0.1–1.0)
Monocytes Relative: 9 %
Neutro Abs: 2.6 10*3/uL (ref 1.7–7.7)
Neutrophils Relative %: 60 %
Platelets: 203 10*3/uL (ref 150–400)
RBC: 3.73 MIL/uL — ABNORMAL LOW (ref 4.22–5.81)
RDW: 16 % — ABNORMAL HIGH (ref 11.5–15.5)
WBC: 4.5 10*3/uL (ref 4.0–10.5)

## 2015-03-26 LAB — COMPREHENSIVE METABOLIC PANEL
ALBUMIN: 3.9 g/dL (ref 3.5–5.0)
ALT: 17 U/L (ref 17–63)
AST: 28 U/L (ref 15–41)
Alkaline Phosphatase: 91 U/L (ref 38–126)
Anion gap: 8 (ref 5–15)
BILIRUBIN TOTAL: 0.8 mg/dL (ref 0.3–1.2)
BUN: 16 mg/dL (ref 6–20)
CO2: 27 mmol/L (ref 22–32)
CREATININE: 0.78 mg/dL (ref 0.61–1.24)
Calcium: 8.9 mg/dL (ref 8.9–10.3)
Chloride: 104 mmol/L (ref 101–111)
GFR calc Af Amer: >60 mL/min (ref >60)
GLUCOSE: 95 mg/dL (ref 65–99)
Potassium: 3.9 mmol/L (ref 3.5–5.1)
Sodium: 139 mmol/L (ref 135–145)
TOTAL PROTEIN: 7 g/dL (ref 6.5–8.1)

## 2015-03-26 MED ORDER — SODIUM CHLORIDE 0.9 % IJ SOLN
10.0000 mL | INTRAMUSCULAR | Status: DC | PRN
  Administered 2015-03-26: 10 mL via INTRAVENOUS
  Filled 2015-03-26: qty 10

## 2015-03-26 MED ORDER — INFLUENZA VAC SPLIT QUAD 0.5 ML IM SUSY
0.5000 mL | PREFILLED_SYRINGE | Freq: Once | INTRAMUSCULAR | Status: AC
  Administered 2015-03-26: 0.5 mL via INTRAMUSCULAR
  Filled 2015-03-26: qty 0.5

## 2015-03-26 MED ORDER — HEPARIN SOD (PORK) LOCK FLUSH 100 UNIT/ML IV SOLN
500.0000 [IU] | Freq: Once | INTRAVENOUS | Status: AC
  Administered 2015-03-26: 500 [IU] via INTRAVENOUS
  Filled 2015-03-26: qty 5

## 2015-03-26 NOTE — Progress Notes (Signed)
See office visit encounter. 

## 2015-03-26 NOTE — Progress Notes (Signed)
         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 Last CT scans of the abdomen and pelvis was on 8/31. Last CEA values have been WNL. Weight today is 206 lbs up from the 170's from his initial diagnosis.   The patient is still experiencing neuropathy.  He stopped taking Cymbalta because he felt like this was not helping with his condition.  He reports feeling a "tingly, jolt sensation" in both hands and feet.  He notes that he can still button his shirt.  He states that if he stands for awhile he begins to feel pain.  He takes pain medication when this happens and everything subsides.   He was hospitalized for bowel obstruction on 8/31.  He describes his pain as "something cutting inside of him".  He was given pain medication and states that this bothered his condition more.  He notes that he later had a large bowel movement and everything subsided.  He has experienced no problems since.  His Medicaid issues have not been resolved.  He currently has no insurance.  He will receive his flu vaccination.  He restarts Xeloda next week.  He also notes that he believes he is no longer able to obtain XELODA therapy.      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 Appearance is most likely to represent diffuse hepatic metastasis   07/30/2014 Initial Diagnosis Colon, biopsy, sigmoid - ADENOCARCINOMA.   07/31/2014 Tumor Marker CEA- 48.0     07/31/2014 Imaging Ct abd/pelvis- concerning for primary colorectal neoplasm in the mid to distal sigmoid colon, with lymphadenopathy in the sigmoid mesocolon, ileocolic mesentery, and retroperitoneum, as well as widespread metastatic disease to the liver   08/01/2014 Imaging CT chest- Pathologic thoracic, right hilar, and infrahilar adenopathy associated with a masslike region of possible consolidation in the right middle lobe, surrounding nodularity, and some scattered bilateral pulmonary nodules.   08/06/2014 - 12/10/2014 Chemotherapy FOLFOX.  Avastin added for cycle 2.  S/P 10 cycles.  Change to maintenance therapy secondary to positive response to therapy, increasing PN, and progressive proteinuria.   10/08/2014 Adverse Reaction Avastin induced proteinuria >/= 2 gm Avastin on hold   10/15/2014 Treatment Plan Change Avastin held for proteinuria.   10/22/2014 Imaging Ct CAP- 1. Interval decrease and soft tissue adjacent to the mid sigmoid colon with decreasing sigmoid colon wall thickening. The  leocolic, mesenteric, perirectal, and mesocolonic lymphadenopathy has decreased in the interval.   10/30/2014 Survivorship Genetic Counseling. Genetic testing was normal, and did not reveal a deleterious mutation in these genes. The test report will be scanned into EPIC and will be located under the Media tab.    11/26/2014 Treatment Plan Change Oxaliplatin reduced x 25% due to PN.   12/24/2014 Treatment Plan Change Change to maintanence therapy.   01/09/2015 - 03/26/2015 Chemotherapy Xeloda 2500 mg BID, 7 days on and 7 days off.   01/30/2015 Imaging CT in ED- Evidence of   distal small bowel obstruction with transition point over the ileum in the right upper pelvis immediately adjacent/abutting the known malignant stricturing of the sigmoid colon. There is wall thickening of the sigmoid colon wi...   01/30/2015 - 01/31/2015 Hospital Admission SBO (small bowel obstruction)   03/26/2015 Treatment Plan Change Xeloda  cost-prohibitive without financial help         I personally reviewed and went over laboratory results with the patient.  The results are noted within this dictation.  Past Medical History  Diagnosis Date  . 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     has No Known Allergies.  We administered Influenza vac split quadrivalent PF, heparin lock flush, and sodium chloride.  Past Surgical History  Procedure Laterality Date  . Esophagogastroduodenoscopy N/A 07/30/2014    Procedure: ESOPHAGOGASTRODUODENOSCOPY (EGD);  Surgeon: Robert D Kaplan, MD;  Location: [facility];  Service: Endoscopy;  Laterality: N/A;  . Colonoscopy N/A 07/30/2014    Procedure: COLONOSCOPY;  Surgeon: Robert D Kaplan, MD;  Location: [facility];  Service: Endoscopy;  Laterality: N/A;  . Portacath placement Right 08/01/14  . Portacath placement N/A 08/01/2014    Procedure: INSERTION RIGHT IJ PORT-A-CATH WITH ULTRASOUND AND FLUORO;  Surgeon: Eric M Wilson, MD;  Location: [facility];  Service: General;  Laterality: N/A;  . Operative ultrasound N/A 08/01/2014    Procedure: OPERATIVE ULTRASOUND;  Surgeon: Eric M Wilson, MD;  Location: [facility];  Service: General;  Laterality: N/A;    Denies any headaches, dizziness, double vision, fevers, chills, night sweats, nausea, vomiting, diarrhea, chest pain, heart palpitations, shortness of breath, blood in stool, black tarry stool, urinary pain, urinary burning, urinary frequency, hematuria. 14 point review of systems was performed and is negative except as detailed under history of present illness and above  PHYSICAL EXAMINATION  ECOG PERFORMANCE STATUS: 0 - Asymptomatic  Filed Vitals:   03/26/15  0937  BP: 100/65  Pulse: 65  Temp: 98.2 F (36.8 C)  Resp: 16    GENERAL:alert, no distress, well nourished, well developed, comfortable, cooperative and smiling, accompanied by his wife. SKIN: skin color, texture, turgor are normal, no rashes or significant lesions HEAD: Normocephalic, No masses, lesions, tenderness or abnormalities EYES: normal, PERRLA, EOMI, Conjunctiva are pink and non-injected EARS: External ears normal OROPHARYNX:lips, buccal mucosa, and tongue normal and mucous membranes are moist  NECK: supple, trachea midline LYMPH:  No palpable adenopathy in the neck or supraclavicular regions, no axillary adenopathy BREAST:not examined LUNGS: Air to auscultation bilaterally with no wheezing or rhonchi HEART: S1 and S2 audible, regular, no ectopy ABDOMEN:abdomen soft and normal bowel sounds. No palpable liver edge. BACK: Back symmetric, no curvature. EXTREMITIES:less then 2 second capillary refill, no joint deformities, effusion, or inflammation, no cyanosis Mild redness of hands and feet  NEURO: alert & oriented x 3 with fluent speech, no focal motor/sensory deficits, gait normal   LABORATORY DATA: I have reviewed the data below as listed.  Results for Burkhammer, Samule R (MRN [id number])   Ref. Range 03/26/2015 10:15  Sodium Latest Ref Range: 135-145 mmol/L 139  Potassium Latest Ref Range: 3.5-5.1 mmol/L 3.9  Chloride Latest Ref Range: 101-111 mmol/L 104  CO2 Latest Ref Range: 22-32 mmol/L 27  BUN Latest Ref Range: 6-20 mg/dL 16  Creatinine   Latest Ref Range: 0.61-1.24 mg/dL 0.78  Calcium Latest Ref Range: 8.9-10.3 mg/dL 8.9  EGFR (Non-African Amer.) Latest Ref Range: >60 mL/min >60  EGFR (African American) Latest Ref Range: >60 mL/min >60  Glucose Latest Ref Range: 65-99 mg/dL 95  Anion gap Latest Ref Range: 5-15  8  Alkaline Phosphatase Latest Ref Range: 38-126 U/L 91  Albumin Latest Ref Range: 3.5-5.0 g/dL 3.9  AST Latest Ref Range: 15-41 U/L 28  ALT Latest  Ref Range: 17-63 U/L 17  Total Protein Latest Ref Range: 6.5-8.1 g/dL 7.0  Total Bilirubin Latest Ref Range: 0.3-1.2 mg/dL 0.8  WBC Latest Ref Range: 4.0-10.5 K/uL 4.5  RBC Latest Ref Range: 4.22-5.81 MIL/uL 3.73 (L)  Hemoglobin Latest Ref Range: 13.0-17.0 g/dL 11.9 (L)  HCT Latest Ref Range: 39.0-52.0 % 33.8 (L)  MCV Latest Ref Range: 78.0-100.0 fL 90.6  MCH Latest Ref Range: 26.0-34.0 pg 31.9  MCHC Latest Ref Range: 30.0-36.0 g/dL 35.2  RDW Latest Ref Range: 11.5-15.5 % 16.0 (H)  Platelets Latest Ref Range: 150-400 K/uL 203  Neutrophils Latest Units: % 60  Lymphocytes Latest Units: % 29  Monocytes Relative Latest Units: % 9  Eosinophil Latest Units: % 2  Basophil Latest Units: % 0  NEUT# Latest Ref Range: 1.7-7.7 K/uL 2.6  Lymphocyte # Latest Ref Range: 0.7-4.0 K/uL 1.3  Monocyte # Latest Ref Range: 0.1-1.0 K/uL 0.4  Eosinophils Absolute Latest Ref Range: 0.0-0.7 K/uL 0.1  Basophils Absolute Latest Ref Range: 0.0-0.1 K/uL 0.0  CEA Latest Ref Range: 0.0-4.7 ng/mL 3.1     Current outpatient prescriptions:  .  docusate sodium (COLACE) 100 MG capsule, Take 1 capsule (100 mg total) by mouth 2 (two) times daily., Disp: 60 capsule, Rfl: 0 .  Multiple Vitamin (MULTIVITAMIN WITH MINERALS) TABS tablet, Take 1 tablet by mouth daily., Disp: , Rfl:  .  oxyCODONE-acetaminophen (PERCOCET/ROXICET) 5-325 MG per tablet, Take 1-2 tablets by mouth every 4 (four) hours as needed for moderate pain. (Patient not taking: Reported on 04/17/2015), Disp: 30 tablet, Rfl: 0 .  polyethylene glycol (MIRALAX / GLYCOLAX) packet, Take 17 g by mouth daily as needed for mild constipation., Disp: 14 each, Rfl: 0 .  dextrose 5 % SOLN 1,000 mL with fluorouracil 5 GM/100ML SOLN, Inject into the vein. To be given on Days 1-5 every 28 days, Disp: , Rfl:  .  Ferrous Sulfate 27 MG TABS, Take 1 tablet by mouth 2 (two) times daily., Disp: , Rfl:  .  LEUCOVORIN CALCIUM IJ, Inject as directed. To be given on Days 1-5 every 28  days, Disp: , Rfl:  .  ondansetron (ZOFRAN) 8 MG tablet, Take 8 mg by mouth every 8 (eight) hours as needed for nausea or vomiting., Disp: , Rfl:  .  prochlorperazine (COMPAZINE) 10 MG tablet, Take 10 mg by mouth every 6 (six) hours as needed for nausea or vomiting., Disp: , Rfl:   Current facility-administered medications:  .  sodium chloride 0.9 % injection 10 mL, 10 mL, Intravenous, PRN, Shannon K Penland, MD, 10 mL at 03/26/15 1032  Facility-Administered Medications Ordered in Other Visits:  .  sodium chloride 0.9 % injection 10 mL, 10 mL, Intravenous, PRN, Shannon K Penland, MD, 10 mL at 08/08/14 1237    ASSESSMENT AND PLAN:  Stage IV colon cancer, KRAS WT  49-year-old male with stage IV colon cancer he has had a significant improvement in his disease and clinical status.He is currently on single agent XELODA with excellent tolerance. His PS is excellent. CEA is   WNL.  Unfortunately AVASTIN had to be discontinued secondary to persistent proteinuria.    If we cannot obtain his XELODA we will change to 5-FU and LV. We will repeat staging studies.  Genetics Counseling  He has completed genetics counseling. His genetic test results were normal--i.e. testing found no meaningful changes to any of 19 genes associated with increased risks for colorectal and other types of cancers.  Nephrotic Range Proteinuria  He has been off of Avastin which is most commonly implicated in proteinuria since May 2. He has been seen by nephrology. We will consider repeat urine protein studies moving forward.  THERAPY PLAN:  Continue therapy a planned   All questions were answered. The patient knows to call the clinic with any problems, questions or concerns. We can certainly see the patient much sooner if necessary.   This document serves as a record of services personally performed by Shannon Penland, MD. It was created on her behalf by Darielle Pickett, a trained medical scribe. The creation of this  record is based on the scribe's personal observations and the provider's statements to them. This document has been checked and approved by the attending provider.  I have reviewed the above documentation for accuracy and completeness, and I agree with the above.  This note was electronically signed.  Shannon K. Penland, MD       

## 2015-03-26 NOTE — Patient Instructions (Signed)
[place] at [facility] Discharge Instructions  RECOMMENDATIONS MADE BY THE CONSULTANT AND ANY TEST RESULTS WILL BE SENT TO YOUR REFERRING PHYSICIAN.  Exam and discussion by Dr. Barbee Shropshire No change in therapy Flu vaccine today Port flush with labs Call with concerns  Follow-up: Office visit in 6 weeks  Thank you for choosing [place] at [facility] to provide your oncology and hematology care.  To afford each patient quality time with our provider, please arrive at least 15 minutes before your scheduled appointment time.    You need to re-schedule your appointment should you arrive 10 or more minutes late.  We strive to give you quality time with our providers, and arriving late affects you and other patients whose appointments are after yours.  Also, if you no show three or more times for appointments you may be dismissed from the clinic at the providers discretion.     Again, thank you for choosing [facility].  Our hope is that these requests will decrease the amount of time that you wait before being seen by our physicians.       _____________________________________________________________  Should you have questions after your visit to [facility], please contact our office at [phone] between the hours of 8:30 a.m. and 4:30 p.m.  Voicemails left after 4:30 p.m. will not be returned until the following business day.  For prescription refill requests, have your pharmacy contact our office.

## 2015-03-27 LAB — CEA: CEA: 3.1 ng/mL (ref 0.0–4.7)

## 2015-03-28 ENCOUNTER — Encounter (HOSPITAL_COMMUNITY): Payer: Self-pay | Admitting: *Deleted

## 2015-03-28 ENCOUNTER — Other Ambulatory Visit (HOSPITAL_COMMUNITY): Payer: Self-pay | Admitting: *Deleted

## 2015-04-09 ENCOUNTER — Other Ambulatory Visit (HOSPITAL_COMMUNITY): Payer: Self-pay | Admitting: Hematology & Oncology

## 2015-04-09 ENCOUNTER — Encounter (HOSPITAL_COMMUNITY): Payer: Medicaid Other | Attending: Hematology & Oncology

## 2015-04-09 DIAGNOSIS — C187 Malignant neoplasm of sigmoid colon: Secondary | ICD-10-CM | POA: Insufficient documentation

## 2015-04-09 DIAGNOSIS — C787 Secondary malignant neoplasm of liver and intrahepatic bile duct: Secondary | ICD-10-CM | POA: Insufficient documentation

## 2015-04-09 NOTE — Patient Instructions (Signed)
[facility]   CHEMOTHERAPY INSTRUCTIONS  Premeds: Compazine 10mg  tablet. This will be given to reduce nausea.  Leucovorin - this is a medication that is not chemo but given with chemo. This med "rescues" the healthy cells before we administer the drug 5FU. This makes the 5FU work better.   5FU: bone marrow suppression (low white blood cells - wbcs fight infection, low red blood cells - rbcs make up your blood, low platelets - this is what makes your blood clot, nausea/vomiting, diarrhea, mouth sores, hair loss, dry skin, ocular toxicities (increased tear production, sensitivity to light). You must wear sunscreen/sunglasses. Cover your skin when out in sunlight. You will get burned very easily.   These drugs will be given Days 1-5 every 28 days.    POTENTIAL SIDE EFFECTS OF TREATMENT: Increased Susceptibility to Infection, Hair Thinning, Changes in Character of Skin and Nails (brittleness, dryness,etc.), Bone Marrow Suppression, Nausea, Diarrhea, Sun Sensitivity and Mouth Sores    EDUCATIONAL MATERIALS GIVEN AND REVIEWED: Specific Instructions Sheets: Leucovorin, 5FU   SELF CARE ACTIVITIES WHILE ON CHEMOTHERAPY: Increase your fluid intake 48 hours prior to treatment and drink at least 2 quarts per day after treatment., No alcohol intake., No aspirin or other medications unless approved by your oncologist., Eat foods that are light and easy to digest., Eat foods at cold or room temperature., No fried, fatty, or spicy foods immediately before or after treatment., Have teeth cleaned professionally before starting treatment. Keep dentures and partial plates clean., Use soft toothbrush and do not use mouthwashes that contain alcohol. Biotene is a good mouthwash that is available at most pharmacies or may be ordered by calling [phone]., Use warm salt water gargles (1 teaspoon salt per 1 quart warm water) before and after meals and at bedtime. Or you may rinse  with 2 tablespoons of three -percent hydrogen peroxide mixed in eight ounces of water., Always use sunscreen with SPF (Sun Protection Factor) of 30 or higher., Use your nausea medication as directed to prevent nausea., Use your stool softener or laxative as directed to prevent constipation. and Use your anti-diarrheal medication as directed to stop diarrhea.  Please wash your hands for at least 30 seconds using warm soapy water. Handwashing is the #1 way to prevent the spread of germs. Stay away from sick people or people who are getting over a cold. If you develop respiratory systems such as green/yellow mucus production or productive cough or persistent cough let us know and we will see if you need an antibiotic. It is a good idea to keep a pair of gloves on when going into grocery stores/[employer] to decrease your risk of coming into contact with germs on the carts, etc. Carry alcohol hand gel with you at all times and use it frequently if out in public. All foods need to be cooked thoroughly. No raw foods. No medium or undercooked meats, eggs. If your food is cooked medium well, it does not need to be hot pink or saturated with bloody liquid at all. Vegetables and fruits need to be washed/rinsed under the faucet with a dish detergent before being consumed. You can eat raw fruits and vegetables unless we tell you otherwise but it would be best if you cooked them or bought frozen. Do not eat off of salad bars or hot bars unless you really trust the cleanliness of the restaurant. If you need dental work, please let Dr. Whitney Muse know before you go for your appointment  so that we can coordinate the best possible time for you in regards to your chemo regimen. You need to also let your dentist know that you are actively taking chemo. We may need to do labs prior to your dental appointment. We also want your bowels moving at least every other day. If this is not happening, we need to know so that we can get you on a  bowel regimen to help you go. Remember that you need to use a condom if you are going to be having sexual intercourse. We do not know how much chemo passes through body secretions so this is the reason for the use of a condom while having sex. This is to protect your spouse from chemo exposure.     MEDICATIONS: You have been given prescriptions for the following medications:  Zofran 8mg  tablet. Take 1 tablet every 8 hours as needed for nausea/vomiting. (#1 nausea med to take, this can constipate)  Compazine 10mg  tablet. Take 1 tablet every 6 hours as needed for nausea/vomiting. (#2 nausea med to take, this can make you sleepy)  EMLA cream. Apply a quarter size amount to port site 1 hour prior to chemo. Do not rub in. Cover with plastic wrap.   Over-the-Counter Meds:  Miralax 1 capful in 8 oz of fluid daily. May increase to two times a day if needed. This is a stool softener. If this doesn't work proceed you can add:  Senokot S  - start with 1 tablet two times a day and increase to 4 tablets two times a day if needed. (total of 8 tablets in a 24 hour period). This is a stimulant laxative.   Call us if this does not help your bowels move.   Imodium 2mg  capsule. Take 2 capsules after the 1st loose stool and then 1 capsule every 2 hours until you go a total of 12 hours without having a loose stool. Call the [facility] if loose stools continue. If it is bedtime and you are having loose stools, take 2 capsules of Imodium and repeat every 4 hours until the morning and call the [facility]. If it is the weekend, go to the Emergency Room.   SYMPTOMS TO REPORT AS SOON AS POSSIBLE AFTER TREATMENT:  FEVER GREATER THAN 100.5 F  CHILLS WITH OR WITHOUT FEVER  NAUSEA AND VOMITING THAT IS NOT CONTROLLED WITH YOUR NAUSEA MEDICATION  UNUSUAL SHORTNESS OF BREATH  UNUSUAL BRUISING OR BLEEDING  TENDERNESS IN MOUTH AND THROAT WITH OR WITHOUT PRESENCE OF ULCERS  URINARY PROBLEMS  BOWEL  PROBLEMS  UNUSUAL RASH    Wear comfortable clothing and clothing appropriate for easy access to any Portacath or PICC line. Let us know if there is anything that we can do to make your therapy better!      I have been informed and understand all of the instructions given to me and have received a copy. I have been instructed to call the clinic [phone] or my family physician as soon as possible for continued medical care, if indicated. I do not have any more questions at this time but understand that I may call the [facility] or the Patient Navigator at [phone] during office hours should I have questions or need assistance in obtaining follow-up care.           Fluorouracil, 5-FU injection What is this medicine? FLUOROURACIL, 5-FU (flure oh YOOR a sil) is a chemotherapy drug. It slows the growth of cancer cells.  This medicine is used to treat many types of cancer like breast cancer, colon or rectal cancer, pancreatic cancer, and stomach cancer. This medicine may be used for other purposes; ask your health care provider or pharmacist if you have questions. What should I tell my health care provider before I take this medicine? They need to know if you have any of these conditions: -blood disorders -dihydropyrimidine dehydrogenase (DPD) deficiency -infection (especially a virus infection such as chickenpox, cold sores, or herpes) -kidney disease -liver disease -malnourished, poor nutrition -recent or ongoing radiation therapy -an unusual or allergic reaction to fluorouracil, other chemotherapy, other medicines, foods, dyes, or preservatives -pregnant or trying to get pregnant -breast-feeding How should I use this medicine? This drug is given as an infusion or injection into a vein. It is administered in a hospital or clinic by a specially trained health care professional. Talk to your pediatrician regarding the use of this medicine in children. Special care may  be needed. Overdosage: If you think you have taken too much of this medicine contact a poison control center or emergency room at once. NOTE: This medicine is only for you. Do not share this medicine with others. What if I miss a dose? It is important not to miss your dose. Call your doctor or health care professional if you are unable to keep an appointment. What may interact with this medicine? -allopurinol -cimetidine -dapsone -digoxin -hydroxyurea -leucovorin -levamisole -medicines for seizures like ethotoin, fosphenytoin, phenytoin -medicines to increase blood counts like filgrastim, pegfilgrastim, sargramostim -medicines that treat or prevent blood clots like warfarin, enoxaparin, and dalteparin -methotrexate -metronidazole -pyrimethamine -some other chemotherapy drugs like busulfan, cisplatin, estramustine, vinblastine -trimethoprim -trimetrexate -vaccines Talk to your doctor or health care professional before taking any of these medicines: -acetaminophen -aspirin -ibuprofen -ketoprofen -naproxen This list may not describe all possible interactions. Give your health care provider a list of all the medicines, herbs, non-prescription drugs, or dietary supplements you use. Also tell them if you smoke, drink alcohol, or use illegal drugs. Some items may interact with your medicine. What should I watch for while using this medicine? Visit your doctor for checks on your progress. This drug may make you feel generally unwell. This is not uncommon, as chemotherapy can affect healthy cells as well as cancer cells. Report any side effects. Continue your course of treatment even though you feel ill unless your doctor tells you to stop. In some cases, you may be given additional medicines to help with side effects. Follow all directions for their use. Call your doctor or health care professional for advice if you get a fever, chills or sore throat, or other symptoms of a cold or flu. Do  not treat yourself. This drug decreases your body's ability to fight infections. Try to avoid being around people who are sick. This medicine may increase your risk to bruise or bleed. Call your doctor or health care professional if you notice any unusual bleeding. Be careful brushing and flossing your teeth or using a toothpick because you may get an infection or bleed more easily. If you have any dental work done, tell your dentist you are receiving this medicine. Avoid taking products that contain aspirin, acetaminophen, ibuprofen, naproxen, or ketoprofen unless instructed by your doctor. These medicines may hide a fever. Do not become pregnant while taking this medicine. Women should inform their doctor if they wish to become pregnant or think they might be pregnant. There is a potential for serious side effects to an  unborn child. Talk to your health care professional or pharmacist for more information. Do not breast-feed an infant while taking this medicine. Men should inform their doctor if they wish to father a child. This medicine may lower sperm counts. Do not treat diarrhea with over the counter products. Contact your doctor if you have diarrhea that lasts more than 2 days or if it is severe and watery. This medicine can make you more sensitive to the sun. Keep out of the sun. If you cannot avoid being in the sun, wear protective clothing and use sunscreen. Do not use sun lamps or tanning beds/booths. What side effects may I notice from receiving this medicine? Side effects that you should report to your doctor or health care professional as soon as possible: -allergic reactions like skin rash, itching or hives, swelling of the face, lips, or tongue -low blood counts - this medicine may decrease the number of white blood cells, red blood cells and platelets. You may be at increased risk for infections and bleeding. -signs of infection - fever or chills, cough, sore throat, pain or difficulty  passing urine -signs of decreased platelets or bleeding - bruising, pinpoint red spots on the skin, black, tarry stools, blood in the urine -signs of decreased red blood cells - unusually weak or tired, fainting spells, lightheadedness -breathing problems -changes in vision -chest pain -mouth sores -nausea and vomiting -pain, swelling, redness at site where injected -pain, tingling, numbness in the hands or feet -redness, swelling, or sores on hands or feet -stomach pain -unusual bleeding Side effects that usually do not require medical attention (report to your doctor or health care professional if they continue or are bothersome): -changes in finger or toe nails -diarrhea -dry or itchy skin -hair loss -headache -loss of appetite -sensitivity of eyes to the light -stomach upset -unusually teary eyes This list may not describe all possible side effects. Call your doctor for medical advice about side effects. You may report side effects to FDA at 1-800-FDA-1088. Where should I keep my medicine? This drug is given in a hospital or clinic and will not be stored at home. NOTE: This sheet is a summary. It may not cover all possible information. If you have questions about this medicine, talk to your doctor, pharmacist, or health care provider.    2016, [employer]. (2007-09-21 13:53:16) Leucovorin injection What is this medicine? LEUCOVORIN (loo koe VOR in) is used to prevent or treat the harmful effects of some medicines. This medicine is used to treat anemia caused by a low amount of folic acid in the body. It is also used with 5-fluorouracil (5-FU) to treat colon cancer. This medicine may be used for other purposes; ask your health care provider or pharmacist if you have questions. What should I tell my health care provider before I take this medicine? They need to know if you have any of these conditions: -anemia from low levels of vitamin B-12 in the blood -an unusual  or allergic reaction to leucovorin, folic acid, other medicines, foods, dyes, or preservatives -pregnant or trying to get pregnant -breast-feeding How should I use this medicine? This medicine is for injection into a muscle or into a vein. It is given by a health care professional in a hospital or clinic setting. Talk to your pediatrician regarding the use of this medicine in children. Special care may be needed. Overdosage: If you think you have taken too much of this medicine contact a poison control center or emergency  room at once. NOTE: This medicine is only for you. Do not share this medicine with others. What if I miss a dose? This does not apply. What may interact with this medicine? -capecitabine -fluorouracil -phenobarbital -phenytoin -primidone -trimethoprim-sulfamethoxazole This list may not describe all possible interactions. Give your health care provider a list of all the medicines, herbs, non-prescription drugs, or dietary supplements you use. Also tell them if you smoke, drink alcohol, or use illegal drugs. Some items may interact with your medicine. What should I watch for while using this medicine? Your condition will be monitored carefully while you are receiving this medicine. This medicine may increase the side effects of 5-fluorouracil, 5-FU. Tell your doctor or health care professional if you have diarrhea or mouth sores that do not get better or that get worse. What side effects may I notice from receiving this medicine? Side effects that you should report to your doctor or health care professional as soon as possible: -allergic reactions like skin rash, itching or hives, swelling of the face, lips, or tongue -breathing problems -fever, infection -mouth sores -unusual bleeding or bruising -unusually weak or tired Side effects that usually do not require medical attention (report to your doctor or health care professional if they continue or are  bothersome): -constipation or diarrhea -loss of appetite -nausea, vomiting This list may not describe all possible side effects. Call your doctor for medical advice about side effects. You may report side effects to FDA at 1-800-FDA-1088. Where should I keep my medicine? This drug is given in a hospital or clinic and will not be stored at home. NOTE: This sheet is a summary. It may not cover all possible information. If you have questions about this medicine, talk to your doctor, pharmacist, or health care provider.    2016, [employer]. (2007-11-22 16:50:29)

## 2015-04-15 ENCOUNTER — Encounter (HOSPITAL_BASED_OUTPATIENT_CLINIC_OR_DEPARTMENT_OTHER): Payer: Medicaid Other

## 2015-04-15 VITALS — BP 116/49 | HR 66 | Temp 97.7°F | Resp 16 | Wt 205.2 lb

## 2015-04-15 DIAGNOSIS — Z5111 Encounter for antineoplastic chemotherapy: Secondary | ICD-10-CM

## 2015-04-15 DIAGNOSIS — C787 Secondary malignant neoplasm of liver and intrahepatic bile duct: Secondary | ICD-10-CM

## 2015-04-15 DIAGNOSIS — C187 Malignant neoplasm of sigmoid colon: Secondary | ICD-10-CM | POA: Diagnosis not present

## 2015-04-15 DIAGNOSIS — C189 Malignant neoplasm of colon, unspecified: Secondary | ICD-10-CM

## 2015-04-15 LAB — CBC WITH DIFFERENTIAL/PLATELET
BASOS PCT: 0 %
Basophils Absolute: 0 10*3/uL (ref 0.0–0.1)
EOS ABS: 0.1 10*3/uL (ref 0.0–0.7)
Eosinophils Relative: 3 %
HCT: 33.5 % — ABNORMAL LOW (ref 39.0–52.0)
HEMOGLOBIN: 11.7 g/dL — AB (ref 13.0–17.0)
Lymphocytes Relative: 20 %
Lymphs Abs: 1.1 10*3/uL (ref 0.7–4.0)
MCH: 32 pg (ref 26.0–34.0)
MCHC: 34.9 g/dL (ref 30.0–36.0)
MCV: 91.5 fL (ref 78.0–100.0)
MONOS PCT: 14 %
Monocytes Absolute: 0.8 10*3/uL (ref 0.1–1.0)
NEUTROS PCT: 63 %
Neutro Abs: 3.5 10*3/uL (ref 1.7–7.7)
PLATELETS: 189 10*3/uL (ref 150–400)
RBC: 3.66 MIL/uL — ABNORMAL LOW (ref 4.22–5.81)
RDW: 16.9 % — AB (ref 11.5–15.5)
WBC: 5.5 10*3/uL (ref 4.0–10.5)

## 2015-04-15 LAB — COMPREHENSIVE METABOLIC PANEL
ALT: 17 U/L (ref 17–63)
AST: 27 U/L (ref 15–41)
Albumin: 3.8 g/dL (ref 3.5–5.0)
Alkaline Phosphatase: 90 U/L (ref 38–126)
Anion gap: 7 (ref 5–15)
BUN: 11 mg/dL (ref 6–20)
CHLORIDE: 105 mmol/L (ref 101–111)
CO2: 27 mmol/L (ref 22–32)
CREATININE: 0.87 mg/dL (ref 0.61–1.24)
Calcium: 8.7 mg/dL — ABNORMAL LOW (ref 8.9–10.3)
Glucose, Bld: 122 mg/dL — ABNORMAL HIGH (ref 65–99)
POTASSIUM: 3.9 mmol/L (ref 3.5–5.1)
Sodium: 139 mmol/L (ref 135–145)
TOTAL PROTEIN: 6.7 g/dL (ref 6.5–8.1)
Total Bilirubin: 0.4 mg/dL (ref 0.3–1.2)

## 2015-04-15 MED ORDER — SODIUM CHLORIDE 0.9 % IV SOLN
8.0000 mg | Freq: Once | INTRAVENOUS | Status: AC
  Administered 2015-04-15: 8 mg via INTRAVENOUS
  Filled 2015-04-15: qty 4

## 2015-04-15 MED ORDER — SODIUM CHLORIDE 0.9 % IV SOLN
425.0000 mg/m2 | Freq: Once | INTRAVENOUS | Status: AC
  Administered 2015-04-15: 900 mg via INTRAVENOUS
  Filled 2015-04-15: qty 18

## 2015-04-15 MED ORDER — PROCHLORPERAZINE MALEATE 10 MG PO TABS
10.0000 mg | ORAL_TABLET | Freq: Once | ORAL | Status: AC
  Administered 2015-04-15: 10 mg via ORAL
  Filled 2015-04-15: qty 1

## 2015-04-15 MED ORDER — SODIUM CHLORIDE 0.9 % IV SOLN
Freq: Once | INTRAVENOUS | Status: AC
  Administered 2015-04-15: 10:00:00 via INTRAVENOUS

## 2015-04-15 MED ORDER — LEUCOVORIN CALCIUM INJECTION 100 MG
20.0000 mg/m2 | Freq: Once | INTRAMUSCULAR | Status: AC
  Administered 2015-04-15: 42 mg via INTRAVENOUS
  Filled 2015-04-15: qty 2.1

## 2015-04-15 MED ORDER — SODIUM CHLORIDE 0.9 % IJ SOLN
10.0000 mL | INTRAMUSCULAR | Status: DC | PRN
  Administered 2015-04-15: 10 mL
  Filled 2015-04-15: qty 10

## 2015-04-15 MED ORDER — HEPARIN SOD (PORK) LOCK FLUSH 100 UNIT/ML IV SOLN
500.0000 [IU] | Freq: Once | INTRAVENOUS | Status: AC | PRN
  Administered 2015-04-15: 500 [IU]
  Filled 2015-04-15: qty 5

## 2015-04-15 NOTE — Progress Notes (Signed)
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 Appearance is most likely to represent diffuse hepatic metastasis   07/30/2014 Initial Diagnosis Colon, biopsy, sigmoid - ADENOCARCINOMA.   07/31/2014 Tumor Marker CEA- 48.0   07/31/2014 Imaging Ct abd/pelvis- concerning for primary colorectal neoplasm in the mid to distal sigmoid colon, with lymphadenopathy in the sigmoid mesocolon, ileocolic mesentery, and retroperitoneum, as well as widespread metastatic disease to the liver   08/01/2014 Imaging CT chest- Pathologic thoracic, right hilar, and infrahilar adenopathy associated with a masslike region of possible consolidation in the right middle lobe, surrounding nodularity, and some scattered bilateral pulmonary nodules.   08/06/2014 - 12/10/2014 Chemotherapy FOLFOX.  Avastin added for cycle 2.  S/P 10 cycles.  Change to maintenance therapy secondary to positive response to therapy, increasing PN, and progressive proteinuria.   10/08/2014 Adverse Reaction Avastin induced proteinuria >/= 2 gm Avastin on hold   10/15/2014 Treatment Plan Change Avastin held for proteinuria.   10/22/2014 Imaging Ct CAP- 1. Interval decrease and soft tissue adjacent to the mid sigmoid colon with decreasing sigmoid colon wall thickening. The   leocolic, mesenteric, perirectal, and mesocolonic lymphadenopathy has decreased in the interval.   10/30/2014 Survivorship Genetic Counseling. Genetic testing was normal, and did not reveal a deleterious mutation in these genes. The test report will be scanned into EPIC and will be located under the Media tab.    11/26/2014 Treatment Plan Change Oxaliplatin reduced x 25% due to PN.   12/24/2014 Treatment Plan Change Change to maintanence therapy.   01/09/2015 - 03/26/2015 Chemotherapy Xeloda 2500 mg BID, 7 days on and 7 days off.   01/30/2015 Imaging CT in ED- Evidence of distal small bowel obstruction with transition point over the ileum in the right upper pelvis immediately adjacent/abutting the known malignant stricturing of the sigmoid colon. There is wall thickening of the sigmoid colon wi...   01/30/2015 - 01/31/2015 Hospital Admission SBO (small bowel obstruction)   03/26/2015 Treatment Plan Change Xeloda cost-prohibitive without financial help   04/15/2015 -  Chemotherapy 5FU/Leucovorin days 1-5 every 28 days    I personally reviewed and went over laboratory results with the patient.  The results are noted within this dictation.  He denies any sores, mouth sores, diarrhea, constipation, etc.  He feels good.    He notes an episode of a penile yeast infection that is resolving.  He is uncircumcised.  He notes that he experienced some itching and when inspected, he noted a white film in his foreskin fold.  He has cleaned and denies any issues since.   Past Medical History  Diagnosis Date  . 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     has No Known Allergies.  Current Outpatient Prescriptions on File  Prior to Visit  Medication Sig Dispense Refill  . dextrose 5 % SOLN 1,000 mL with fluorouracil 5 GM/100ML SOLN Inject into the vein. To be given on Days 1-5 every 28 days    . docusate sodium (COLACE) 100 MG capsule Take 1 capsule (100 mg total) by mouth 2 (two) times daily. 60 capsule 0  . Ferrous Sulfate 27 MG TABS Take 1 tablet by mouth 2 (two) times daily.    [name] LEUCOVORIN CALCIUM IJ Inject as directed. To be given on Days 1-5 every 28 days    . Multiple Vitamin (MULTIVITAMIN WITH MINERALS) TABS tablet Take 1 tablet by mouth daily.    . polyethylene glycol (MIRALAX / GLYCOLAX) packet Take 17 g by mouth daily as needed for mild constipation. 14 each 0  . ondansetron (ZOFRAN) 8 MG tablet Take 8 mg by mouth every 8 (eight) hours as needed for nausea or vomiting.    [name] oxyCODONE-acetaminophen (PERCOCET/ROXICET) 5-325 MG per tablet Take 1-2 tablets by mouth every 4 (four) hours as needed for moderate pain. (Patient not taking: Reported on 04/17/2015) 30 tablet 0  . prochlorperazine (COMPAZINE) 10 MG tablet Take 10 mg by mouth every 6 (six) hours as needed for nausea or vomiting.     Current Facility-Administered Medications on File Prior to Visit  Medication Dose Route Frequency Provider Last Rate Last Dose  . heparin lock flush 100 unit/mL  500 Units Intracatheter Once PRN Patrici Ranks, MD      . sodium chloride 0.9 % injection 10 mL  10 mL Intravenous PRN Patrici Ranks, MD   10 mL at 08/08/14 1237  . sodium chloride 0.9 % injection 10 mL  10 mL Intravenous PRN Patrici Ranks, MD   10 mL at 03/26/15 1032  . sodium chloride 0.9 % injection 10 mL  10 mL Intracatheter PRN Patrici Ranks, MD        Past Surgical History  Procedure Laterality Date  . Esophagogastroduodenoscopy N/A 07/30/2014    Procedure: ESOPHAGOGASTRODUODENOSCOPY (EGD);  Surgeon: Inda Castle, MD;  Location: [facility];  Service: Endoscopy;  Laterality: N/A;  . Colonoscopy N/A 07/30/2014    Procedure:  COLONOSCOPY;  Surgeon: Inda Castle, MD;  Location: [facility];  Service: Endoscopy;  Laterality: N/A;  . Portacath placement Right 08/01/14  . Portacath placement N/A 08/01/2014    Procedure: INSERTION RIGHT IJ PORT-A-CATH WITH ULTRASOUND AND FLUORO;  Surgeon: Gayland Curry, MD;  Location: [facility] ORS;  Service: General;  Laterality: N/A;  . Operative ultrasound N/A 08/01/2014    Procedure: OPERATIVE ULTRASOUND;  Surgeon: Gayland Curry, MD;  Location: [facility] ORS;  Service: General;  Laterality: N/A;    Denies any headaches, dizziness, double vision, fevers, chills, night sweats, nausea, vomiting, diarrhea, constipation, chest pain, heart palpitations, shortness of breath, blood in stool, black tarry stool, urinary pain, urinary burning, urinary frequency, hematuria.   PHYSICAL EXAMINATION  ECOG PERFORMANCE STATUS: 1 - Symptomatic but completely ambulatory  Filed Vitals:   04/17/15 0839  BP: 113/63  Pulse: 72  Temp: 97.6 F (36.4 C)  Resp: 18    GENERAL:alert, no distress, well nourished, well developed, comfortable, cooperative, obese, smiling and accompanied by his wife and in chemo-recliner. SKIN: skin color, texture, turgor are normal, no rashes or significant lesions HEAD: Normocephalic, No masses, lesions, tenderness or abnormalities EYES: normal, PERRLA, EOMI, Conjunctiva are pink  and non-injected EARS: External ears normal OROPHARYNX:lips, buccal mucosa, and tongue normal and mucous membranes are moist  NECK: supple, trachea midline LYMPH:  not examined BREAST:not examined LUNGS: clear to auscultation and percussion HEART: regular rate & rhythm, no murmurs, no gallops, S1 normal and S2 normal ABDOMEN:abdomen soft, non-tender and normal bowel sounds BACK: Back symmetric, no curvature., No CVA tenderness EXTREMITIES:less then 2 second capillary refill, no joint deformities, effusion, or inflammation, no edema, no skin discoloration, no clubbing, no cyanosis  NEURO: alert & oriented x  3 with fluent speech, no focal motor/sensory deficits, gait normal   LABORATORY DATA: CBC    Component Value Date/Time   WBC 5.5 04/15/2015 0850   RBC 3.66* 04/15/2015 0850   RBC 3.42* 07/28/2014 1440   HGB 11.7* 04/15/2015 0850   HCT 33.5* 04/15/2015 0850   PLT 189 04/15/2015 0850   MCV 91.5 04/15/2015 0850   MCH 32.0 04/15/2015 0850   MCHC 34.9 04/15/2015 0850   RDW 16.9* 04/15/2015 0850   LYMPHSABS 1.1 04/15/2015 0850   MONOABS 0.8 04/15/2015 0850   EOSABS 0.1 04/15/2015 0850   BASOSABS 0.0 04/15/2015 0850      Chemistry      Component Value Date/Time   NA 139 04/15/2015 0850   K 3.9 04/15/2015 0850   CL 105 04/15/2015 0850   CO2 27 04/15/2015 0850   BUN 11 04/15/2015 0850   CREATININE 0.87 04/15/2015 0850      Component Value Date/Time   CALCIUM 8.7* 04/15/2015 0850   ALKPHOS 90 04/15/2015 0850   AST 27 04/15/2015 0850   ALT 17 04/15/2015 0850   BILITOT 0.4 04/15/2015 0850        PENDING LABS:   RADIOGRAPHIC STUDIES:  No results found.   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  As a result, he was switched to Xeloda 2500 mg BID, 7 days on and 7 days as a maintenance treatment.  In October 2016, this treatment became cost-prohibitive and therefore, on 04/15/2015, he was transitioned to treatment with 5FU/Leucovorin in a days 1-5 every 28 day fashion.  Genetic Counseling is complete and is negative.  Oncology history is updated.  Pre-chemo labs as ordered every 28 days: CBC diff, CMET, CEA.   Labs in 2 weeks: CBC diff, CMET for nadir check  Return in 2 weeks for nadir check.  I have recommended keeping penile foreskin clean and dry.  If issues arise, he is to call us.  He  thought he experienced a penile yeast infection.  With supportive care at home, it is improving.    THERAPY PLAN:  Treatment as planned in a maintenance setting.  Will follow CEAs and when/if clinically indicated, will restage with imaging.  All questions were answered. The patient knows to call the clinic with any problems, questions or concerns. We can certainly see the patient much sooner if necessary.  Patient and plan discussed with Dr. Ancil Linsey and she is in agreement with the aforementioned.   This note is electronically signed by: Doy Mince 04/17/2015 9:32 AM

## 2015-04-15 NOTE — Patient Instructions (Signed)
[facility] Discharge Instructions for Patients Receiving Chemotherapy  Today you received the following chemotherapy agents Leucovorin and 5FU IV push.  Return tomorrow as scheduled for Day 2.  If you develop nausea and vomiting that is not controlled by your nausea medication, call the clinic. If it is after clinic hours your family physician or the after hours number for the clinic or go to the Emergency Department. BELOW ARE SYMPTOMS THAT SHOULD BE REPORTED IMMEDIATELY:  *FEVER GREATER THAN 101.0 F  *CHILLS WITH OR WITHOUT FEVER  NAUSEA AND VOMITING THAT IS NOT CONTROLLED WITH YOUR NAUSEA MEDICATION  *UNUSUAL SHORTNESS OF BREATH  *UNUSUAL BRUISING OR BLEEDING  TENDERNESS IN MOUTH AND THROAT WITH OR WITHOUT PRESENCE OF ULCERS  *URINARY PROBLEMS  *BOWEL PROBLEMS  UNUSUAL RASH Items with * indicate a potential emergency and should be followed up as soon as possible. I have been informed and understand all the instructions given to me. I know to contact the clinic, my physician, or go to the Emergency Department if any problems should occur. I do not have any questions at this time, but understand that I may call the clinic during office hours or the Patient Navigator at [phone] should I have any questions or need assistance in obtaining follow up care.    __________________________________________  _____________  __________ Signature of Patient or Authorized Representative            Date                   Time    __________________________________________ Nurse's Signature

## 2015-04-15 NOTE — Progress Notes (Signed)
Labs reviewed per Meriel Flavors. OK to give chemo. Tolerated chemo well.

## 2015-04-15 NOTE — Assessment & Plan Note (Addendum)
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  As a result, he was switched to Xeloda 2500 mg BID, 7 days on and 7 days as a maintenance treatment.  In October 2016, this treatment became cost-prohibitive and therefore, on 04/15/2015, he was transitioned to treatment with 5FU/Leucovorin in a days 1-5 every 28 day fashion.  Genetic Counseling is complete and is negative.  Oncology history is updated.  Pre-chemo labs as ordered every 28 days: CBC diff, CMET, CEA.   Labs in 2 weeks: CBC diff, CMET for nadir check  Return in 2 weeks for nadir check.  I have recommended keeping penile foreskin clean and dry.  If issues arise, he is to call us.  He thought he experienced a penile yeast infection.  With supportive care at home, it is improving.

## 2015-04-16 ENCOUNTER — Encounter (HOSPITAL_BASED_OUTPATIENT_CLINIC_OR_DEPARTMENT_OTHER): Payer: Medicaid Other

## 2015-04-16 VITALS — BP 112/84 | HR 65 | Temp 98.1°F | Resp 16

## 2015-04-16 DIAGNOSIS — C787 Secondary malignant neoplasm of liver and intrahepatic bile duct: Secondary | ICD-10-CM

## 2015-04-16 DIAGNOSIS — Z5111 Encounter for antineoplastic chemotherapy: Secondary | ICD-10-CM

## 2015-04-16 DIAGNOSIS — C189 Malignant neoplasm of colon, unspecified: Secondary | ICD-10-CM

## 2015-04-16 LAB — CEA: CEA: 2.7 ng/mL (ref 0.0–4.7)

## 2015-04-16 MED ORDER — SODIUM CHLORIDE 0.9 % IV SOLN
8.0000 mg | Freq: Once | INTRAVENOUS | Status: AC
  Administered 2015-04-16: 8 mg via INTRAVENOUS
  Filled 2015-04-16: qty 4

## 2015-04-16 MED ORDER — HEPARIN SOD (PORK) LOCK FLUSH 100 UNIT/ML IV SOLN
500.0000 [IU] | Freq: Once | INTRAVENOUS | Status: AC | PRN
  Administered 2015-04-16: 500 [IU]

## 2015-04-16 MED ORDER — SODIUM CHLORIDE 0.9 % IJ SOLN: 10.0000 mL | INTRAMUSCULAR | Status: DC | PRN

## 2015-04-16 MED ORDER — FLUOROURACIL CHEMO INJECTION 2.5 GM/50ML
425.0000 mg/m2 | Freq: Once | INTRAVENOUS | Status: AC
  Administered 2015-04-16: 900 mg via INTRAVENOUS
  Filled 2015-04-16: qty 18

## 2015-04-16 MED ORDER — PROCHLORPERAZINE MALEATE 10 MG PO TABS
ORAL_TABLET | ORAL | Status: AC
  Filled 2015-04-16: qty 1

## 2015-04-16 MED ORDER — PROCHLORPERAZINE MALEATE 10 MG PO TABS
10.0000 mg | ORAL_TABLET | Freq: Once | ORAL | Status: AC
Start: 2015-04-16 — End: 2015-04-16
  Administered 2015-04-16: 10 mg via ORAL

## 2015-04-16 MED ORDER — HEPARIN SOD (PORK) LOCK FLUSH 100 UNIT/ML IV SOLN
INTRAVENOUS | Status: AC
  Filled 2015-04-16: qty 5

## 2015-04-16 MED ORDER — LEUCOVORIN CALCIUM INJECTION 100 MG
20.0000 mg/m2 | Freq: Once | INTRAMUSCULAR | Status: AC
  Administered 2015-04-16: 42 mg via INTRAVENOUS
  Filled 2015-04-16: qty 2.1

## 2015-04-16 MED ORDER — SODIUM CHLORIDE 0.9 % IV SOLN
Freq: Once | INTRAVENOUS | Status: AC
  Administered 2015-04-16: 09:00:00 via INTRAVENOUS

## 2015-04-16 NOTE — Patient Instructions (Signed)
[facility] Discharge Instructions for Patients Receiving Chemotherapy  Today you received the following chemotherapy agents Leucovorin and 5FU day 2 of 5. If you develop nausea and vomiting that is not controlled by your nausea medication, call the clinic. If it is after clinic hours your family physician or the after hours number for the clinic or go to the Emergency Department. BELOW ARE SYMPTOMS THAT SHOULD BE REPORTED IMMEDIATELY:  *FEVER GREATER THAN 101.0 F  *CHILLS WITH OR WITHOUT FEVER  NAUSEA AND VOMITING THAT IS NOT CONTROLLED WITH YOUR NAUSEA MEDICATION  *UNUSUAL SHORTNESS OF BREATH  *UNUSUAL BRUISING OR BLEEDING  TENDERNESS IN MOUTH AND THROAT WITH OR WITHOUT PRESENCE OF ULCERS  *URINARY PROBLEMS  *BOWEL PROBLEMS  UNUSUAL RASH Items with * indicate a potential emergency and should be followed up as soon as possible.  Return tomorrow as scheduled.  I have been informed and understand all the instructions given to me. I know to contact the clinic, my physician, or go to the Emergency Department if any problems should occur. I do not have any questions at this time, but understand that I may call the clinic during office hours or the Patient Navigator at [phone] should I have any questions or need assistance in obtaining follow up care.    __________________________________________  _____________  __________ Signature of Patient or Authorized Representative            Date                   Time    __________________________________________ Nurse's Signature

## 2015-04-16 NOTE — Progress Notes (Signed)
Tolerated chemo well. 

## 2015-04-17 ENCOUNTER — Encounter (HOSPITAL_COMMUNITY): Payer: Self-pay | Admitting: Oncology

## 2015-04-17 ENCOUNTER — Encounter (HOSPITAL_BASED_OUTPATIENT_CLINIC_OR_DEPARTMENT_OTHER): Payer: Medicaid Other | Admitting: Oncology

## 2015-04-17 ENCOUNTER — Inpatient Hospital Stay (HOSPITAL_COMMUNITY): Payer: Medicaid Other

## 2015-04-17 ENCOUNTER — Encounter (HOSPITAL_BASED_OUTPATIENT_CLINIC_OR_DEPARTMENT_OTHER): Payer: Medicaid Other

## 2015-04-17 VITALS — BP 113/63 | HR 72 | Temp 97.6°F | Resp 18 | Wt 206.2 lb

## 2015-04-17 VITALS — BP 102/45 | HR 63 | Resp 16

## 2015-04-17 DIAGNOSIS — C787 Secondary malignant neoplasm of liver and intrahepatic bile duct: Secondary | ICD-10-CM

## 2015-04-17 DIAGNOSIS — C187 Malignant neoplasm of sigmoid colon: Secondary | ICD-10-CM

## 2015-04-17 DIAGNOSIS — C189 Malignant neoplasm of colon, unspecified: Secondary | ICD-10-CM

## 2015-04-17 DIAGNOSIS — Z5111 Encounter for antineoplastic chemotherapy: Secondary | ICD-10-CM

## 2015-04-17 MED ORDER — PROCHLORPERAZINE MALEATE 10 MG PO TABS
ORAL_TABLET | ORAL | Status: AC
  Filled 2015-04-17: qty 1

## 2015-04-17 MED ORDER — SODIUM CHLORIDE 0.9 % IV SOLN
Freq: Once | INTRAVENOUS | Status: AC
  Administered 2015-04-17: 09:00:00 via INTRAVENOUS

## 2015-04-17 MED ORDER — SODIUM CHLORIDE 0.9 % IJ SOLN
10.0000 mL | INTRAMUSCULAR | Status: DC | PRN
  Administered 2015-04-17: 10 mL
  Filled 2015-04-17: qty 10

## 2015-04-17 MED ORDER — HEPARIN SOD (PORK) LOCK FLUSH 100 UNIT/ML IV SOLN
500.0000 [IU] | Freq: Once | INTRAVENOUS | Status: AC | PRN
  Administered 2015-04-17: 500 [IU]

## 2015-04-17 MED ORDER — PROCHLORPERAZINE MALEATE 10 MG PO TABS
10.0000 mg | ORAL_TABLET | Freq: Once | ORAL | Status: AC
  Administered 2015-04-17: 10 mg via ORAL

## 2015-04-17 MED ORDER — SODIUM CHLORIDE 0.9 % IV SOLN
425.0000 mg/m2 | Freq: Once | INTRAVENOUS | Status: AC
  Administered 2015-04-17: 900 mg via INTRAVENOUS
  Filled 2015-04-17: qty 18

## 2015-04-17 MED ORDER — SODIUM CHLORIDE 0.9 % IV SOLN
8.0000 mg | Freq: Once | INTRAVENOUS | Status: AC
  Administered 2015-04-17: 8 mg via INTRAVENOUS
  Filled 2015-04-17: qty 4

## 2015-04-17 MED ORDER — HEPARIN SOD (PORK) LOCK FLUSH 100 UNIT/ML IV SOLN
INTRAVENOUS | Status: AC
  Filled 2015-04-17: qty 5

## 2015-04-17 MED ORDER — LEUCOVORIN CALCIUM INJECTION 100 MG
20.0000 mg/m2 | Freq: Once | INTRAMUSCULAR | Status: AC
  Administered 2015-04-17: 42 mg via INTRAVENOUS
  Filled 2015-04-17: qty 2.1

## 2015-04-17 NOTE — Patient Instructions (Signed)
..  [place] at [facility] Discharge Instructions  RECOMMENDATIONS MADE BY THE CONSULTANT AND ANY TEST RESULTS WILL BE SENT TO YOUR REFERRING PHYSICIAN.  Return as scheduled with labs Keep penile foreskin clean and dry  Thank you for choosing [place] at [facility] to provide your oncology and hematology care.  To afford each patient quality time with our provider, please arrive at least 15 minutes before your scheduled appointment time.    You need to re-schedule your appointment should you arrive 10 or more minutes late.  We strive to give you quality time with our providers, and arriving late affects you and other patients whose appointments are after yours.  Also, if you no show three or more times for appointments you may be dismissed from the clinic at the providers discretion.     Again, thank you for choosing [facility].  Our hope is that these requests will decrease the amount of time that you wait before being seen by our physicians.       _____________________________________________________________  Should you have questions after your visit to [facility], please contact our office at [phone] between the hours of 8:30 a.m. and 4:30 p.m.  Voicemails left after 4:30 p.m. will not be returned until the following business day.  For prescription refill requests, have your pharmacy contact our office.

## 2015-04-17 NOTE — Patient Instructions (Signed)
[facility] Discharge Instructions for Patients Receiving Chemotherapy  Today you received the following chemotherapy agents Leucovorin and 5FU Day 4 of 5.  If you develop nausea and vomiting that is not controlled by your nausea medication, call the clinic. If it is after clinic hours your family physician or the after hours number for the clinic or go to the Emergency Department. BELOW ARE SYMPTOMS THAT SHOULD BE REPORTED IMMEDIATELY:  *FEVER GREATER THAN 101.0 F  *CHILLS WITH OR WITHOUT FEVER  NAUSEA AND VOMITING THAT IS NOT CONTROLLED WITH YOUR NAUSEA MEDICATION  *UNUSUAL SHORTNESS OF BREATH  *UNUSUAL BRUISING OR BLEEDING  TENDERNESS IN MOUTH AND THROAT WITH OR WITHOUT PRESENCE OF ULCERS  *URINARY PROBLEMS  *BOWEL PROBLEMS  UNUSUAL RASH Items with * indicate a potential emergency and should be followed up as soon as possible. Return as scheduled tomorrow. I have been informed and understand all the instructions given to me. I know to contact the clinic, my physician, or go to the Emergency Department if any problems should occur. I do not have any questions at this time, but understand that I may call the clinic during office hours or the Patient Navigator at [phone] should I have any questions or need assistance in obtaining follow up care.    __________________________________________  _____________  __________ Signature of Patient or Authorized Representative            Date                   Time    __________________________________________ Nurse's Signature

## 2015-04-17 NOTE — Progress Notes (Signed)
Tolerated chemo well. 

## 2015-04-18 ENCOUNTER — Encounter (HOSPITAL_COMMUNITY): Payer: Self-pay

## 2015-04-18 ENCOUNTER — Encounter (HOSPITAL_BASED_OUTPATIENT_CLINIC_OR_DEPARTMENT_OTHER): Payer: Medicaid Other

## 2015-04-18 VITALS — BP 105/48 | HR 62 | Temp 97.7°F | Resp 18 | Wt 206.0 lb

## 2015-04-18 DIAGNOSIS — C187 Malignant neoplasm of sigmoid colon: Secondary | ICD-10-CM

## 2015-04-18 DIAGNOSIS — C189 Malignant neoplasm of colon, unspecified: Secondary | ICD-10-CM

## 2015-04-18 DIAGNOSIS — C787 Secondary malignant neoplasm of liver and intrahepatic bile duct: Secondary | ICD-10-CM

## 2015-04-18 DIAGNOSIS — Z5111 Encounter for antineoplastic chemotherapy: Secondary | ICD-10-CM

## 2015-04-18 MED ORDER — SODIUM CHLORIDE 0.9 % IV SOLN
8.0000 mg | Freq: Once | INTRAVENOUS | Status: AC
  Administered 2015-04-18: 8 mg via INTRAVENOUS
  Filled 2015-04-18: qty 4

## 2015-04-18 MED ORDER — HEPARIN SOD (PORK) LOCK FLUSH 100 UNIT/ML IV SOLN
INTRAVENOUS | Status: AC
  Filled 2015-04-18: qty 5

## 2015-04-18 MED ORDER — SODIUM CHLORIDE 0.9 % IV SOLN
425.0000 mg/m2 | Freq: Once | INTRAVENOUS | Status: AC
  Administered 2015-04-18: 900 mg via INTRAVENOUS
  Filled 2015-04-18: qty 18

## 2015-04-18 MED ORDER — PROCHLORPERAZINE MALEATE 10 MG PO TABS
10.0000 mg | ORAL_TABLET | Freq: Once | ORAL | Status: AC
  Administered 2015-04-18: 10 mg via ORAL

## 2015-04-18 MED ORDER — PROCHLORPERAZINE MALEATE 10 MG PO TABS
ORAL_TABLET | ORAL | Status: AC
  Filled 2015-04-18: qty 1

## 2015-04-18 MED ORDER — LEUCOVORIN CALCIUM INJECTION 100 MG
20.0000 mg/m2 | Freq: Once | INTRAMUSCULAR | Status: AC
  Administered 2015-04-18: 42 mg via INTRAVENOUS
  Filled 2015-04-18: qty 2.1

## 2015-04-18 MED ORDER — SODIUM CHLORIDE 0.9 % IV SOLN
Freq: Once | INTRAVENOUS | Status: AC
  Administered 2015-04-18: 09:00:00 via INTRAVENOUS

## 2015-04-18 MED ORDER — SODIUM CHLORIDE 0.9 % IJ SOLN: 10.0000 mL | INTRAMUSCULAR | Status: DC | PRN

## 2015-04-18 MED ORDER — HEPARIN SOD (PORK) LOCK FLUSH 100 UNIT/ML IV SOLN
500.0000 [IU] | Freq: Once | INTRAVENOUS | Status: AC | PRN
  Administered 2015-04-18: 500 [IU]

## 2015-04-18 NOTE — Patient Instructions (Signed)
[facility] Discharge Instructions for Patients Receiving Chemotherapy  Today you received the following chemotherapy agents:  5FU and leucovorin.  If you develop nausea and vomiting, or diarrhea that is not controlled by your medication, call the clinic.  The clinic phone number is [phone]. Office hours are Monday-Friday 8:30am-5:00pm.  BELOW ARE SYMPTOMS THAT SHOULD BE REPORTED IMMEDIATELY:  *FEVER GREATER THAN 101.0 F  *CHILLS WITH OR WITHOUT FEVER  NAUSEA AND VOMITING THAT IS NOT CONTROLLED WITH YOUR NAUSEA MEDICATION  *UNUSUAL SHORTNESS OF BREATH  *UNUSUAL BRUISING OR BLEEDING  TENDERNESS IN MOUTH AND THROAT WITH OR WITHOUT PRESENCE OF ULCERS  *URINARY PROBLEMS  *BOWEL PROBLEMS  UNUSUAL RASH Items with * indicate a potential emergency and should be followed up as soon as possible. If you have an emergency after office hours please contact your primary care physician or go to the nearest emergency department.  Please call the clinic during office hours if you have any questions or concerns.   You may also contact the Patient Navigator at [phone] should you have any questions or need assistance in obtaining follow up care.

## 2015-04-18 NOTE — Progress Notes (Signed)
1035:  Tolerated tx w/o adverse reaction.  A&Ox4, in no distress.  VSS.  Discharged ambulatory.

## 2015-04-19 ENCOUNTER — Encounter (HOSPITAL_COMMUNITY): Payer: Self-pay

## 2015-04-19 ENCOUNTER — Encounter (HOSPITAL_BASED_OUTPATIENT_CLINIC_OR_DEPARTMENT_OTHER): Payer: Medicaid Other

## 2015-04-19 VITALS — BP 103/57 | HR 63 | Temp 97.8°F | Resp 18

## 2015-04-19 DIAGNOSIS — C787 Secondary malignant neoplasm of liver and intrahepatic bile duct: Secondary | ICD-10-CM

## 2015-04-19 DIAGNOSIS — C187 Malignant neoplasm of sigmoid colon: Secondary | ICD-10-CM

## 2015-04-19 DIAGNOSIS — Z5111 Encounter for antineoplastic chemotherapy: Secondary | ICD-10-CM

## 2015-04-19 DIAGNOSIS — C189 Malignant neoplasm of colon, unspecified: Secondary | ICD-10-CM

## 2015-04-19 MED ORDER — SODIUM CHLORIDE 0.9 % IJ SOLN
10.0000 mL | INTRAMUSCULAR | Status: DC | PRN
  Administered 2015-04-19: 10 mL
  Filled 2015-04-19: qty 10

## 2015-04-19 MED ORDER — LEUCOVORIN CALCIUM INJECTION 100 MG
20.0000 mg/m2 | Freq: Once | INTRAMUSCULAR | Status: AC
  Administered 2015-04-19: 42 mg via INTRAVENOUS
  Filled 2015-04-19: qty 2.1

## 2015-04-19 MED ORDER — HEPARIN SOD (PORK) LOCK FLUSH 100 UNIT/ML IV SOLN
INTRAVENOUS | Status: AC
  Filled 2015-04-19: qty 5

## 2015-04-19 MED ORDER — SODIUM CHLORIDE 0.9 % IV SOLN
Freq: Once | INTRAVENOUS | Status: AC
  Administered 2015-04-19: 09:00:00 via INTRAVENOUS

## 2015-04-19 MED ORDER — HEPARIN SOD (PORK) LOCK FLUSH 100 UNIT/ML IV SOLN
500.0000 [IU] | Freq: Once | INTRAVENOUS | Status: AC | PRN
  Administered 2015-04-19: 500 [IU]

## 2015-04-19 MED ORDER — SODIUM CHLORIDE 0.9 % IV SOLN
8.0000 mg | Freq: Once | INTRAVENOUS | Status: AC
  Administered 2015-04-19: 8 mg via INTRAVENOUS
  Filled 2015-04-19: qty 4

## 2015-04-19 MED ORDER — FLUOROURACIL CHEMO INJECTION 2.5 GM/50ML
425.0000 mg/m2 | Freq: Once | INTRAVENOUS | Status: AC
  Administered 2015-04-19: 900 mg via INTRAVENOUS
  Filled 2015-04-19: qty 18

## 2015-04-19 MED ORDER — PROCHLORPERAZINE MALEATE 10 MG PO TABS
10.0000 mg | ORAL_TABLET | Freq: Once | ORAL | Status: AC
  Administered 2015-04-19: 10 mg via ORAL
  Filled 2015-04-19: qty 1

## 2015-04-19 NOTE — Patient Instructions (Signed)
[facility] Discharge Instructions for Patients Receiving Chemotherapy  Today you received the following chemotherapy agents leucovorin and 38fu Follow up as scheduled Please call the clinic   To help prevent nausea and vomiting after your treatment, we encourage you to take your nausea medication    If you develop nausea and vomiting, or diarrhea that is not controlled by your medication, call the clinic.  The clinic phone number is [phone]. Office hours are Monday-Friday 8:30am-5:00pm.  BELOW ARE SYMPTOMS THAT SHOULD BE REPORTED IMMEDIATELY:  *FEVER GREATER THAN 101.0 F  *CHILLS WITH OR WITHOUT FEVER  NAUSEA AND VOMITING THAT IS NOT CONTROLLED WITH YOUR NAUSEA MEDICATION  *UNUSUAL SHORTNESS OF BREATH  *UNUSUAL BRUISING OR BLEEDING  TENDERNESS IN MOUTH AND THROAT WITH OR WITHOUT PRESENCE OF ULCERS  *URINARY PROBLEMS  *BOWEL PROBLEMS  UNUSUAL RASH Items with * indicate a potential emergency and should be followed up as soon as possible. If you have an emergency after office hours please contact your primary care physician or go to the nearest emergency department.  Please call the clinic during office hours if you have any questions or concerns.   You may also contact the Patient Navigator at [phone] should you have any questions or need assistance in obtaining follow up care.

## 2015-04-19 NOTE — Progress Notes (Signed)
Tom [name] Tolerated chemotherapy well today Discharged well

## 2015-04-27 ENCOUNTER — Encounter (HOSPITAL_COMMUNITY): Payer: Self-pay | Admitting: Hematology & Oncology

## 2015-04-28 NOTE — Progress Notes (Signed)
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 Appearance is most likely to represent diffuse hepatic metastasis   07/30/2014 Initial Diagnosis Colon, biopsy, sigmoid - ADENOCARCINOMA.   07/31/2014 Tumor Marker CEA- 48.0   07/31/2014 Imaging Ct abd/pelvis- concerning for primary colorectal neoplasm in the mid to distal sigmoid colon, with lymphadenopathy in the sigmoid mesocolon, ileocolic mesentery, and retroperitoneum, as well as widespread metastatic disease to the liver   08/01/2014 Imaging CT chest- Pathologic thoracic, right hilar, and infrahilar adenopathy associated with a masslike region of possible consolidation in the right middle lobe, surrounding nodularity, and some scattered bilateral pulmonary nodules.   08/06/2014 - 12/10/2014 Chemotherapy FOLFOX.  Avastin added for cycle 2.  S/P 10 cycles.  Change to maintenance therapy secondary to positive response to therapy, increasing PN, and progressive proteinuria.   10/08/2014 Adverse Reaction Avastin induced proteinuria >/= 2 gm Avastin on hold   10/15/2014 Treatment Plan Change Avastin held for proteinuria.   10/22/2014 Imaging Ct CAP- 1. Interval decrease and soft tissue adjacent to the mid sigmoid colon with decreasing sigmoid colon wall thickening. The   leocolic, mesenteric, perirectal, and mesocolonic lymphadenopathy has decreased in the interval.   10/30/2014 Survivorship Genetic Counseling. Genetic testing was normal, and did not reveal a deleterious mutation in these genes. The test report will be scanned into EPIC and will be located under the Media tab.    11/26/2014 Treatment Plan Change Oxaliplatin reduced x 25% due to PN.   12/24/2014 Treatment Plan Change Change to maintanence therapy.   01/09/2015 - 03/26/2015 Chemotherapy Xeloda 2500 mg BID, 7 days on and 7 days off.   01/30/2015 Imaging CT in ED- Evidence of distal small bowel obstruction with transition point over the ileum in the right upper pelvis immediately adjacent/abutting the known malignant stricturing of the sigmoid colon. There is wall thickening of the sigmoid colon wi...   01/30/2015 - 01/31/2015 Hospital Admission SBO (small bowel obstruction)   03/26/2015 Treatment Plan Change Xeloda cost-prohibitive without financial help   04/15/2015 -  Chemotherapy 5FU/Leucovorin days 1-5 every 28 days    I personally reviewed and went over laboratory results with the patient.  The results are noted within this dictation.  He denies any sores, mouth sores, diarrhea, constipation, etc.  He feels good.  He notes a 1-2 day history of loose stools that have since resolved.  He reports minimal epistaxis.  I suspect this is secondary to dry heat at home.  I recommended Ocean Spray PRN to keep mucous membranes moist.  Additionally, I recommended a humidifier in the home, particularly in the bedroom at [facility].  He otherwise denies any complaints.  Past Medical History  Diagnosis Date  . 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     has No Known Allergies.  Current Outpatient Prescriptions on File Prior to Visit  Medication Sig Dispense Refill  . dextrose 5 % SOLN 1,000 mL with fluorouracil 5 GM/100ML SOLN Inject into the vein. To be given on Days 1-5 every 28 days    . docusate sodium (COLACE) 100 MG capsule Take 1 capsule (100 mg total) by mouth 2 (two) times daily. 60 capsule 0  . Ferrous Sulfate 27 MG TABS Take 1 tablet by mouth 2 (two) times daily.    [name] LEUCOVORIN CALCIUM IJ Inject as directed. To be given on Days 1-5 every 28 days    . Multiple Vitamin (MULTIVITAMIN WITH MINERALS) TABS tablet Take 1 tablet by mouth daily.    . ondansetron (ZOFRAN) 8 MG tablet Take 8 mg by mouth every 8 (eight) hours as needed for nausea or vomiting.    [name] oxyCODONE-acetaminophen (PERCOCET/ROXICET) 5-325 MG per tablet Take 1-2 tablets by mouth every 4 (four) hours as needed for moderate pain. (Patient not taking: Reported on 04/17/2015) 30 tablet 0  . polyethylene glycol (MIRALAX / GLYCOLAX) packet Take 17 g by mouth daily as needed for mild constipation. 14 each 0  . prochlorperazine (COMPAZINE) 10 MG tablet Take 10 mg by mouth every 6 (six) hours as needed for nausea or vomiting.     Current Facility-Administered Medications on File Prior to Visit  Medication Dose Route Frequency Provider Last Rate Last Dose  . sodium chloride 0.9 % injection 10 mL  10 mL Intravenous PRN Patrici Ranks, MD   10 mL at 08/08/14 1237    Past Surgical History  Procedure Laterality Date  . Esophagogastroduodenoscopy N/A 07/30/2014    Procedure: ESOPHAGOGASTRODUODENOSCOPY (EGD);  Surgeon: Inda Castle, MD;  Location: [facility];  Service: Endoscopy;  Laterality: N/A;  . Colonoscopy N/A 07/30/2014    Procedure: COLONOSCOPY;  Surgeon: Inda Castle, MD;  Location: [facility];  Service: Endoscopy;  Laterality: N/A;  . Portacath placement Right 08/01/14  . Portacath placement N/A 08/01/2014    Procedure: INSERTION  RIGHT IJ PORT-A-CATH WITH ULTRASOUND AND FLUORO;  Surgeon: Gayland Curry, MD;  Location: [facility];  Service: General;  Laterality: N/A;  . Operative ultrasound N/A 08/01/2014    Procedure: OPERATIVE ULTRASOUND;  Surgeon: Gayland Curry, MD;  Location: [facility];  Service: General;  Laterality: N/A;    Denies any headaches, dizziness, double vision, fevers, chills, night sweats, nausea, vomiting, diarrhea, constipation, chest pain, heart palpitations, shortness of breath, blood in stool, black tarry stool, urinary pain, urinary burning, urinary frequency, hematuria.   PHYSICAL EXAMINATION  ECOG PERFORMANCE STATUS: 1 - Symptomatic but completely ambulatory  Filed Vitals:   04/29/15 0900  BP: 113/66  Pulse: 63  Temp: 97.8 F (36.6 C)  Resp: 18    GENERAL:alert, no distress, well nourished, well developed, comfortable, cooperative, obese, smiling and accompanied by his wife. SKIN: skin color, texture, turgor are normal, no rashes or significant lesions HEAD: Normocephalic, No masses, lesions, tenderness or abnormalities EYES: normal, PERRLA, EOMI, Conjunctiva are pink and non-injected EARS: External ears normal OROPHARYNX:lips, buccal mucosa, and tongue normal and mucous membranes are moist  NECK: supple, trachea midline LYMPH:  not examined BREAST:not examined LUNGS: clear to auscultation and percussion HEART: regular rate & rhythm, no murmurs, no gallops, S1 normal and S2 normal ABDOMEN:abdomen soft, non-tender and normal bowel sounds BACK: Back symmetric,  no curvature., No CVA tenderness EXTREMITIES:less then 2 second capillary refill, no joint deformities, effusion, or inflammation, no edema, no skin discoloration, no clubbing, no cyanosis  NEURO: alert & oriented x 3 with fluent speech, no focal motor/sensory deficits, gait normal   LABORATORY DATA: CBC    Component Value Date/Time   WBC 5.5 04/15/2015 0850   RBC 3.66* 04/15/2015 0850   RBC 3.42* 07/28/2014 1440   HGB 11.7*  04/15/2015 0850   HCT 33.5* 04/15/2015 0850   PLT 189 04/15/2015 0850   MCV 91.5 04/15/2015 0850   MCH 32.0 04/15/2015 0850   MCHC 34.9 04/15/2015 0850   RDW 16.9* 04/15/2015 0850   LYMPHSABS 1.1 04/15/2015 0850   MONOABS 0.8 04/15/2015 0850   EOSABS 0.1 04/15/2015 0850   BASOSABS 0.0 04/15/2015 0850      Chemistry      Component Value Date/Time   NA 139 04/15/2015 0850   K 3.9 04/15/2015 0850   CL 105 04/15/2015 0850   CO2 27 04/15/2015 0850   BUN 11 04/15/2015 0850   CREATININE 0.87 04/15/2015 0850      Component Value Date/Time   CALCIUM 8.7* 04/15/2015 0850   ALKPHOS 90 04/15/2015 0850   AST 27 04/15/2015 0850   ALT 17 04/15/2015 0850   BILITOT 0.4 04/15/2015 0850     Lab Results  Component Value Date   CEA 2.7 04/15/2015     PENDING LABS:   RADIOGRAPHIC STUDIES:  No results found.   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  As a result, he was switched to Xeloda 2500 mg BID, 7 days on and 7 days as a maintenance treatment.  In October 2016, this treatment became cost-prohibitive and therefore, on 04/15/2015, he was transitioned to treatment with 5FU/Leucovorin in a days 1-5 every 28 day fashion.  Genetic Counseling is complete and is negative.  Oncology history is updated.  Pre-chemo labs as ordered every 28 days: CBC diff, CMET, CEA.   Today for a nadir check: CBC diff, CMET  I have recommended Ocean Spray PRN to maintain moist mucous membranes.  Additionally, he can use a humidifier at home, particularly in the bedroom at [facility].  Return in 2 weeks for follow-up and to embark on cycle #2.   THERAPY PLAN:  Treatment as planned in a maintenance setting.  Will follow CEAs and  when/if clinically indicated, will restage with imaging.  All questions were answered. The patient knows to call the clinic with any problems, questions or concerns. We can certainly see the patient much sooner if necessary.  Patient and plan discussed with Dr. Ancil Linsey and she is in agreement with the aforementioned.   This note is electronically signed by: Doy Mince 04/29/2015 9:55 AM

## 2015-04-28 NOTE — Assessment & Plan Note (Addendum)
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  As a result, he was switched to Xeloda 2500 mg BID, 7 days on and 7 days as a maintenance treatment.  In October 2016, this treatment became cost-prohibitive and therefore, on 04/15/2015, he was transitioned to treatment with 5FU/Leucovorin in a days 1-5 every 28 day fashion.  Genetic Counseling is complete and is negative.  Oncology history is updated.  Pre-chemo labs as ordered every 28 days: CBC diff, CMET, CEA.   Today for a nadir check: CBC diff, CMET  I have recommended Ocean Spray PRN to maintain moist mucous membranes.  Additionally, he can use a humidifier at home, particularly in the bedroom at [facility].  Return in 2 weeks for follow-up and to embark on cycle #2.

## 2015-04-29 ENCOUNTER — Encounter (HOSPITAL_BASED_OUTPATIENT_CLINIC_OR_DEPARTMENT_OTHER): Payer: Medicaid Other | Admitting: Oncology

## 2015-04-29 ENCOUNTER — Encounter (HOSPITAL_COMMUNITY): Payer: Self-pay | Admitting: Oncology

## 2015-04-29 VITALS — BP 113/66 | HR 63 | Temp 97.8°F | Resp 18 | Wt 202.8 lb

## 2015-04-29 DIAGNOSIS — C787 Secondary malignant neoplasm of liver and intrahepatic bile duct: Secondary | ICD-10-CM

## 2015-04-29 DIAGNOSIS — C189 Malignant neoplasm of colon, unspecified: Secondary | ICD-10-CM

## 2015-04-29 DIAGNOSIS — C187 Malignant neoplasm of sigmoid colon: Secondary | ICD-10-CM | POA: Diagnosis not present

## 2015-04-29 LAB — CBC WITH DIFFERENTIAL/PLATELET
BASOS ABS: 0 10*3/uL (ref 0.0–0.1)
Basophils Relative: 0 %
EOS ABS: 0.3 10*3/uL (ref 0.0–0.7)
EOS PCT: 5 %
HCT: 33.8 % — ABNORMAL LOW (ref 39.0–52.0)
HEMOGLOBIN: 11.9 g/dL — AB (ref 13.0–17.0)
LYMPHS ABS: 1.5 10*3/uL (ref 0.7–4.0)
LYMPHS PCT: 29 %
MCH: 32 pg (ref 26.0–34.0)
MCHC: 35.2 g/dL (ref 30.0–36.0)
MCV: 90.9 fL (ref 78.0–100.0)
Monocytes Absolute: 0.4 10*3/uL (ref 0.1–1.0)
Monocytes Relative: 8 %
NEUTROS PCT: 58 %
Neutro Abs: 2.9 10*3/uL (ref 1.7–7.7)
PLATELETS: 224 10*3/uL (ref 150–400)
RBC: 3.72 MIL/uL — AB (ref 4.22–5.81)
RDW: 15.9 % — ABNORMAL HIGH (ref 11.5–15.5)
WBC: 5.1 10*3/uL (ref 4.0–10.5)

## 2015-04-29 LAB — COMPREHENSIVE METABOLIC PANEL
ALK PHOS: 80 U/L (ref 38–126)
ALT: 17 U/L (ref 17–63)
AST: 22 U/L (ref 15–41)
Albumin: 3.4 g/dL — ABNORMAL LOW (ref 3.5–5.0)
BUN: 14 mg/dL (ref 6–20)
CALCIUM: 8.6 mg/dL — AB (ref 8.9–10.3)
CO2: 28 mmol/L (ref 22–32)
CREATININE: 0.73 mg/dL (ref 0.61–1.24)
Chloride: 110 mmol/L (ref 101–111)
GFR calc Af Amer: >60 mL/min (ref >60)
GFR calc non Af Amer: >60 mL/min (ref >60)
GLUCOSE: 108 mg/dL — AB (ref 65–99)
Potassium: 4 mmol/L (ref 3.5–5.1)
SODIUM: 140 mmol/L (ref 135–145)
Total Bilirubin: 0.4 mg/dL (ref 0.3–1.2)
Total Protein: 6.4 g/dL — ABNORMAL LOW (ref 6.5–8.1)

## 2015-04-29 NOTE — Progress Notes (Signed)
..  Tom [name]'s reason for visit today is for labs as scheduled per MD orders.  Venipuncture performed with a 23 gauge butterfly needle to R Antecubital.  [name] tolerated procedure well and without incident; questions were answered and patient was discharged.

## 2015-04-29 NOTE — Patient Instructions (Signed)
[place] at [facility] Discharge Instructions  RECOMMENDATIONS MADE BY THE CONSULTANT AND ANY TEST RESULTS WILL BE SENT TO YOUR REFERRING PHYSICIAN.  Exam and discussion by Robynn Pane, PA-C Will check labs today Use humidifier at home [facility] as needed Report fevers, uncontrolled nausea, vomiting, diarrhea or other concerns;   Follow-up in 2 weeks for treatment and follow-up  Thank you for choosing [place] at [facility] to provide your oncology and hematology care.  To afford each patient quality time with our provider, please arrive at least 15 minutes before your scheduled appointment time.    You need to re-schedule your appointment should you arrive 10 or more minutes late.  We strive to give you quality time with our providers, and arriving late affects you and other patients whose appointments are after yours.  Also, if you no show three or more times for appointments you may be dismissed from the clinic at the providers discretion.     Again, thank you for choosing [facility].  Our hope is that these requests will decrease the amount of time that you wait before being seen by our physicians.       _____________________________________________________________  Should you have questions after your visit to [facility], please contact our office at [phone] between the hours of 8:30 a.m. and 4:30 p.m.  Voicemails left after 4:30 p.m. will not be returned until the following business day.  For prescription refill requests, have your pharmacy contact our office.

## 2015-05-02 ENCOUNTER — Ambulatory Visit (HOSPITAL_COMMUNITY): Payer: Medicaid Other | Admitting: Hematology & Oncology

## 2015-05-02 ENCOUNTER — Encounter (HOSPITAL_COMMUNITY): Payer: Medicaid Other

## 2015-05-09 ENCOUNTER — Encounter (HOSPITAL_COMMUNITY): Payer: Medicaid Other

## 2015-05-13 ENCOUNTER — Encounter (HOSPITAL_BASED_OUTPATIENT_CLINIC_OR_DEPARTMENT_OTHER): Payer: Medicaid Other | Admitting: Oncology

## 2015-05-13 ENCOUNTER — Encounter (HOSPITAL_COMMUNITY): Payer: Self-pay | Admitting: Oncology

## 2015-05-13 ENCOUNTER — Encounter (HOSPITAL_COMMUNITY): Payer: Medicaid Other | Attending: Hematology & Oncology

## 2015-05-13 VITALS — BP 108/64 | HR 62 | Temp 99.5°F | Resp 15 | Wt 204.2 lb

## 2015-05-13 DIAGNOSIS — C189 Malignant neoplasm of colon, unspecified: Secondary | ICD-10-CM

## 2015-05-13 DIAGNOSIS — C787 Secondary malignant neoplasm of liver and intrahepatic bile duct: Secondary | ICD-10-CM

## 2015-05-13 DIAGNOSIS — C7800 Secondary malignant neoplasm of unspecified lung: Secondary | ICD-10-CM

## 2015-05-13 DIAGNOSIS — C187 Malignant neoplasm of sigmoid colon: Secondary | ICD-10-CM | POA: Insufficient documentation

## 2015-05-13 DIAGNOSIS — L271 Localized skin eruption due to drugs and medicaments taken internally: Secondary | ICD-10-CM

## 2015-05-13 LAB — COMPREHENSIVE METABOLIC PANEL
ALK PHOS: 94 U/L (ref 38–126)
ALT: 24 U/L (ref 17–63)
AST: 34 U/L (ref 15–41)
Albumin: 3.4 g/dL — ABNORMAL LOW (ref 3.5–5.0)
Anion gap: 7 (ref 5–15)
BUN: 13 mg/dL (ref 6–20)
CALCIUM: 8.9 mg/dL (ref 8.9–10.3)
CHLORIDE: 104 mmol/L (ref 101–111)
CO2: 27 mmol/L (ref 22–32)
CREATININE: 0.91 mg/dL (ref 0.61–1.24)
GFR calc Af Amer: >60 mL/min (ref >60)
Glucose, Bld: 117 mg/dL — ABNORMAL HIGH (ref 65–99)
Potassium: 4 mmol/L (ref 3.5–5.1)
Sodium: 138 mmol/L (ref 135–145)
Total Bilirubin: 0.5 mg/dL (ref 0.3–1.2)
Total Protein: 6.4 g/dL — ABNORMAL LOW (ref 6.5–8.1)

## 2015-05-13 LAB — CBC WITH DIFFERENTIAL/PLATELET
BASOS ABS: 0 10*3/uL (ref 0.0–0.1)
Basophils Relative: 1 %
Eosinophils Absolute: 0.1 10*3/uL (ref 0.0–0.7)
Eosinophils Relative: 2 %
HEMATOCRIT: 35.4 % — AB (ref 39.0–52.0)
HEMOGLOBIN: 12.4 g/dL — AB (ref 13.0–17.0)
LYMPHS ABS: 1.6 10*3/uL (ref 0.7–4.0)
LYMPHS PCT: 31 %
MCH: 32.2 pg (ref 26.0–34.0)
MCHC: 35 g/dL (ref 30.0–36.0)
MCV: 91.9 fL (ref 78.0–100.0)
Monocytes Absolute: 0.8 10*3/uL (ref 0.1–1.0)
Monocytes Relative: 15 %
NEUTROS ABS: 2.6 10*3/uL (ref 1.7–7.7)
NEUTROS PCT: 51 %
PLATELETS: 211 10*3/uL (ref 150–400)
RBC: 3.85 MIL/uL — AB (ref 4.22–5.81)
RDW: 14.8 % (ref 11.5–15.5)
WBC: 5 10*3/uL (ref 4.0–10.5)

## 2015-05-13 MED ORDER — PROCHLORPERAZINE MALEATE 10 MG PO TABS
ORAL_TABLET | ORAL | Status: AC
  Filled 2015-05-13: qty 1

## 2015-05-13 MED ORDER — HEPARIN SOD (PORK) LOCK FLUSH 100 UNIT/ML IV SOLN
500.0000 [IU] | Freq: Once | INTRAVENOUS | Status: DC | PRN
  Filled 2015-05-13: qty 5

## 2015-05-13 MED ORDER — PROCHLORPERAZINE MALEATE 10 MG PO TABS: 10.0000 mg | ORAL_TABLET | Freq: Once | ORAL | Status: DC

## 2015-05-13 MED ORDER — SODIUM CHLORIDE 0.9 % IV SOLN: Freq: Once | INTRAVENOUS | Status: DC

## 2015-05-13 MED ORDER — SODIUM CHLORIDE 0.9 % IV SOLN
8.0000 mg | Freq: Once | INTRAVENOUS | Status: DC
  Filled 2015-05-13: qty 4

## 2015-05-13 MED ORDER — LEUCOVORIN CALCIUM INJECTION 100 MG
20.0000 mg/m2 | Freq: Once | INTRAMUSCULAR | Status: DC
  Filled 2015-05-13: qty 2.1

## 2015-05-13 MED ORDER — FLUOROURACIL CHEMO INJECTION 2.5 GM/50ML
425.0000 mg/m2 | Freq: Once | INTRAVENOUS | Status: DC
  Filled 2015-05-13: qty 18

## 2015-05-13 MED ORDER — SODIUM CHLORIDE 0.9 % IJ SOLN: 10.0000 mL | INTRAMUSCULAR | Status: DC | PRN

## 2015-05-13 NOTE — Progress Notes (Signed)
Tom Bellow, MD [address] [place] [address]  Adenocarcinoma of colon metastatic to liver Tom [name])  CURRENT THERAPY: 5FU/Leucovorin days 1-5 every 28 days.  INTERVAL HISTORY: Tom [name] 49 y.o. 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 Appearance is most likely to represent diffuse hepatic metastasis   07/30/2014 Initial Diagnosis Colon, biopsy, sigmoid - ADENOCARCINOMA.   07/31/2014 Tumor Marker CEA- 48.0   07/31/2014 Imaging Ct abd/pelvis- concerning for primary colorectal neoplasm in the mid to distal sigmoid colon, with lymphadenopathy in the sigmoid mesocolon, ileocolic mesentery, and retroperitoneum, as well as widespread metastatic disease to the liver   08/01/2014 Imaging CT chest- Pathologic thoracic, right hilar, and infrahilar adenopathy associated with a masslike region of possible consolidation in the right middle lobe, surrounding nodularity, and some scattered bilateral pulmonary nodules.   08/06/2014 - 12/10/2014 Chemotherapy FOLFOX.  Avastin added for cycle 2.  S/P 10 cycles.  Change to maintenance therapy secondary to positive response to therapy, increasing PN, and progressive proteinuria.   10/08/2014 Adverse Reaction Avastin induced proteinuria >/= 2 gm Avastin on hold   10/15/2014 Treatment Plan Change Avastin held for proteinuria.   10/22/2014 Imaging Ct CAP- 1. Interval decrease and soft tissue adjacent to the mid sigmoid colon with decreasing sigmoid colon wall thickening. The  leocolic, mesenteric, perirectal, and mesocolonic lymphadenopathy has decreased in the  interval.   10/30/2014 Survivorship Genetic Counseling. Genetic testing was normal, and did not reveal a deleterious mutation in these genes. The test report will be scanned into EPIC and will be located under the Media tab.    11/26/2014 Treatment Plan Change Oxaliplatin reduced x 25% due to PN.   12/24/2014 Treatment Plan Change Change to maintanence therapy.   01/09/2015 - 03/26/2015 Chemotherapy Xeloda 2500 mg BID, 7 days on and 7 days off.   01/30/2015 Imaging CT in ED- Evidence of distal small bowel obstruction with transition point over the ileum in the right upper pelvis immediately adjacent/abutting the known malignant stricturing of the sigmoid colon. There is wall thickening of the sigmoid colon wi...   01/30/2015 - 01/31/2015 [name] Admission SBO (small bowel obstruction)   03/26/2015 Treatment Plan Change Xeloda cost-prohibitive without financial help   04/15/2015 -  Chemotherapy 5FU/Leucovorin days 1-5 every 28 days   05/13/2015 Adverse Reaction Palmar-Plantar erythrodysesthesia   05/13/2015 Treatment Plan Change 5FU/Leucovorin held    I personally reviewed and went over laboratory results with the patient.  The results are noted within this dictation.  Labs meet treatment parameters today.  He denies any complaints at all except LE edema and itching of soles of feet and peeling skin of hands.  LE: He notes that he has been more active at home and not keep his feet elevated.  He has 2+ pitting edema pre-tibially and in ankles.  He denies any pain in his LE.  Skin peeling and itching: This is an afterthought on his behalf.  His skin peeling on his hands is impressive and the soles of his feet shows peeling skin with some erythema of the soles.  He denies any pain or soreness associated with this.  I suspect he has 5FU-induced palmar plantar erythrodysesthesia.  As a result, we will not pursue therapy today.  His treatment plan is altered accordingly.  He is given education regarding this.   He notes that it began a week or so ago.  It was not present at the time of therapy 4 weeks ago.   Past Medical History  Diagnosis Date  . 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     has No Known Allergies.  Current Outpatient Prescriptions on File Prior to Visit  Medication Sig Dispense Refill  . dextrose 5 % SOLN 1,000 mL with fluorouracil 5 GM/100ML SOLN Inject into the vein. To be given on Days 1-5 every 28 days    . docusate sodium (COLACE) 100 MG capsule Take 1 capsule (100 mg total) by mouth 2 (two) times daily. 60 capsule 0  . Ferrous Sulfate 27 MG TABS Take 1 tablet by mouth 2 (two) times daily.    [name] LEUCOVORIN CALCIUM IJ Inject as directed. To be given on Days 1-5 every 28 days    . Multiple Vitamin (MULTIVITAMIN WITH MINERALS) TABS tablet Take 1 tablet by mouth daily.    . ondansetron (ZOFRAN) 8 MG tablet Take 8 mg by mouth every 8 (eight) hours as needed for nausea or vomiting.    [name] oxyCODONE-acetaminophen (PERCOCET/ROXICET) 5-325 MG per tablet Take 1-2 tablets by mouth every 4 (four) hours as needed for moderate pain. 30 tablet 0  . polyethylene glycol (MIRALAX / GLYCOLAX) packet Take 17 g by mouth daily as needed for mild constipation. 14 each 0  . prochlorperazine (COMPAZINE) 10 MG tablet Take 10 mg by mouth every 6 (six) hours as needed for nausea or vomiting.    . vitamin E 400 UNIT capsule Take 400 Units by mouth daily.     Current Facility-Administered Medications on File Prior to Visit  Medication Dose Route Frequency Provider Last Rate Last Dose  . sodium chloride 0.9 % injection 10 mL  10 mL Intravenous PRN Patrici Ranks, MD   10 mL at 08/08/14 1237    Past Surgical  History  Procedure Laterality Date  . Esophagogastroduodenoscopy N/A 07/30/2014    Procedure: ESOPHAGOGASTRODUODENOSCOPY (EGD);  Surgeon: Inda Castle, MD;  Location: [facility];  Service: Endoscopy;  Laterality: N/A;  . Colonoscopy N/A 07/30/2014    Procedure: COLONOSCOPY;  Surgeon: Inda Castle, MD;  Location: [facility];  Service: Endoscopy;  Laterality: N/A;  . Portacath placement Right 08/01/14  . Portacath placement N/A 08/01/2014    Procedure: INSERTION RIGHT IJ PORT-A-CATH WITH ULTRASOUND AND FLUORO;  Surgeon: Gayland Curry, MD;  Location: [facility];  Service: General;  Laterality: N/A;  . Operative ultrasound N/A 08/01/2014    Procedure: OPERATIVE ULTRASOUND;  Surgeon: Gayland Curry, MD;  Location: [facility];  Service: General;  Laterality: N/A;    Denies any headaches, dizziness, double vision, fevers, chills, night sweats, nausea, vomiting, diarrhea, constipation, chest pain, heart palpitations, shortness of breath, blood in stool, black tarry stool, urinary pain, urinary burning, urinary frequency, hematuria.   PHYSICAL EXAMINATION  ECOG PERFORMANCE STATUS: 0 - Asymptomatic  Filed Vitals:   05/13/15 1000  BP: 108/64  Pulse: 62  Temp: 99.5 F (37.5 C)  Resp: 15  GENERAL:alert, no distress, well nourished, well developed, comfortable, cooperative, smiling and unaccompanied today. SKIN: skin color, texture, turgor are normal, no rashes or significant lesions HEAD: Normocephalic, No masses, lesions, tenderness or abnormalities EYES: normal, EOMI, Conjunctiva are pink and non-injected EARS: External ears normal OROPHARYNX:lips, buccal mucosa, and tongue normal and mucous membranes are moist  NECK: supple, no adenopathy, trachea midline LYMPH:  no palpable lymphadenopathy BREAST:not examined LUNGS: clear to auscultation  HEART: regular rate & rhythm ABDOMEN:abdomen soft and normal bowel sounds BACK: Back symmetric, no curvature. EXTREMITIES:less then 2 second capillary  refill, no skin discoloration, no cyanosis, positive findings:  edema 2-3+ pitting edema in LE pre-tibially, ankles, and feet bilaterally.  Additionally, he has dry, cracking, and peeling skin of his palms and soles of feet with erythema on palms and soles of feet bilaterally.     NEURO: alert & oriented x 3 with fluent speech, no focal motor/sensory deficits, gait normal    LABORATORY DATA: CBC    Component Value Date/Time   WBC 5.0 05/13/2015 1025   RBC 3.85* 05/13/2015 1025   RBC 3.42* 07/28/2014 1440   HGB 12.4* 05/13/2015 1025   HCT 35.4* 05/13/2015 1025   PLT 211 05/13/2015 1025   MCV 91.9 05/13/2015 1025   MCH 32.2 05/13/2015 1025   MCHC 35.0 05/13/2015 1025   RDW 14.8 05/13/2015 1025   LYMPHSABS 1.6 05/13/2015 1025   MONOABS 0.8 05/13/2015 1025   EOSABS 0.1 05/13/2015 1025   BASOSABS 0.0 05/13/2015 1025      Chemistry      Component Value Date/Time   NA 138 05/13/2015 1025   K 4.0 05/13/2015 1025   CL 104 05/13/2015 1025   CO2 27 05/13/2015 1025   BUN 13 05/13/2015 1025   CREATININE 0.91 05/13/2015 1025      Component Value Date/Time   CALCIUM 8.9 05/13/2015 1025   ALKPHOS 94 05/13/2015 1025   AST 34 05/13/2015 1025   ALT 24 05/13/2015 1025   BILITOT 0.5 05/13/2015 1025        PENDING LABS:   RADIOGRAPHIC STUDIES:  No results found.   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  As a result, he was switched to Xeloda 2500 mg BID, 7 days on and 7 days as a maintenance treatment.  In October 2016, this treatment became cost-prohibitive and therefore, on 04/15/2015, he was transitioned to treatment with 5FU/Leucovorin in a days 1-5 every 28 day fashion.  Genetic Counseling is  complete and is negative.  Oncology history is updated.  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  Pictures are taken and within this dictation.  Treatment held today. He is provided patient education regarding this complication.  Return in 2 weeks for follow-up.  Treatment is to be held until he is seen by a provider.    THERAPY PLAN:  Hold treatment today for palmar plantar erythrodysesthesia secondary to 5FU therapy.  All questions were answered. The patient knows to call the clinic with any problems, questions or concerns. We can certainly see the patient much sooner if necessary.  Patient and plan discussed with Dr. Ancil Linsey and she is in agreement with the aforementioned.   This note is  electronically signed by: Doy Mince 05/13/2015 5:56 PM

## 2015-05-13 NOTE — Progress Notes (Signed)
Patient not to be treated per PA assessment.  Patient aware.

## 2015-05-13 NOTE — Patient Instructions (Addendum)
[place] at [facility] Discharge Instructions  RECOMMENDATIONS MADE BY THE CONSULTANT AND ANY TEST RESULTS WILL BE SENT TO YOUR REFERRING PHYSICIAN.  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  See attached information  Follow-up: Office visit and possible chemotherapy treatment in 2 weeks. (If treated will be a short week, 4 days) Please call the clinic if you have any questions or concerns  Hand-Foot Syndrome  Other terms: Palmar-Plantar Erythrodysesthesia; PPE  What is hand-foot syndrome? Also called hand-foot syndrome or hand-to-foot syndrome, Palmar-Plantar Erythrodysesthesia is a side effect, which can occur with several types of chemotherapy or biologic therapy drugs used to treat cancer. For example, Capecitabine (Xeloda), 5-Flurouracil (5FU), continuous-infusion doxorubicin, doxorubicin liposomal (Doxil), and high-dose Interleukin-2 can cause this skin reaction for some patients. Following administration of chemotherapy, small amounts of drug leak out of very small blood vessels called capillaries in the palms of the hands and soles of the feet. Exposure of your hands and feet to heat as well as friction on your palms and soles increases the amount of drug in the capillaries and increases the amount of drug leakage. This leakage of drug results in redness, tenderness, and possibly peeling of the palms and soles. The redness, also known as palmar-plantar erythema, looks like sunburn. The areas affected can become dry and peel, with numbness or tingling developing. Hand-foot syndrome can be uncomfortable and can interfere with your ability to carry out normal activities.  Things you can do if you suspect hand-foot syndrome (Palmar-Plantar Erythrodysesthesia): Prevention: Prevention is very important in trying to reduce the development of hand-foot syndrome. Actions taken to prevent hand-foot  syndrome will help reduce the severity of symptoms should they develop. This involves modifying some of your normal daily activities to reduce friction and heat exposure to your hands and feet for a period of time following treatment (approximately one week after IV medication, much as possible during the time you are taking oral (by mouth) medication such as capcitabine).  Avoid long exposure of hands and feet to hot water such as washing dishes, long showers, or tub baths.  Short showers in tepid water will reduce exposure of the soles of your feet to the drug.  Dishwashing gloves should not be worn, as the rubber will hold heat against your palms.  Avoid increased pressure on the soles of the feet or palms of hands.  No jogging, aerobics, power walking, jumping - avoid long days of walking.  You should also avoid using garden tools, household tools such as screwdrivers, and other tasks where you are squeezing your hand on a hard surface.  Using knives to chop food may also cause excessive pressure and friction on your palms.  Cooling procedures: Cold may provide temporary relief for pain and tenderness caused by hand-foot syndrome.  Placing the palms or bottoms of your feet on an ice pack or a bag of frozen peas may be very comforting. Alternate on and off for 15-20 minutes at a time. Lotions: Rubbing lotion on your palms and soles should be avoided during the same period, although keeping these areas moist is very important between treatments.  Emollients such as Aveeno, Lubriderm, Udder Cream, and Bag Balm provide excellent moisturizing to your hands and feet. Pain relief: Over the counter pain relievers such as acetaminophen (Tylenol) may be helpful to relieve discomfort associated with hand-foot syndrome. Check with your doctor.  Vitamins: Taking Vitamin B6 (pyridoxine)  may be beneficial to preventing and treating Plantar-Palmar Erythrodysesthesia, and should be discussed with your  doctor. Drugs/treatment changes that may be prescribed by your doctor: Chemotherapy treatments may need to be interrupted or the dose adjusted to prevent worsening of hand-foot syndrome. When to call your doctor or health care professional: If you notice that your palms or soles become red or tender. This most often occurs before any peeling, and recommendations for relief of discomfort can be given. If you are on chemotherapy pills, you may be asked to hold treatment, or need your dose adjusted to prevent worsening of symptoms.    Thank you for choosing [place] at [facility] to provide your oncology and hematology care.  To afford each patient quality time with our provider, please arrive at least 15 minutes before your scheduled appointment time.    You need to re-schedule your appointment should you arrive 10 or more minutes late.  We strive to give you quality time with our providers, and arriving late affects you and other patients whose appointments are after yours.  Also, if you no show three or more times for appointments you may be dismissed from the clinic at the providers discretion.     Again, thank you for choosing [facility].  Our hope is that these requests will decrease the amount of time that you wait before being seen by our physicians.       _____________________________________________________________  Should you have questions after your visit to [facility], please contact our office at [phone] between the hours of 8:30 a.m. and 4:30 p.m.  Voicemails left after 4:30 p.m. will not be returned until the following business day.  For prescription refill requests, have your pharmacy contact our office.

## 2015-05-13 NOTE — Assessment & Plan Note (Addendum)
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  As a result, he was switched to Xeloda 2500 mg BID, 7 days on and 7 days as a maintenance treatment.  In October 2016, this treatment became cost-prohibitive and therefore, on 04/15/2015, he was transitioned to treatment with 5FU/Leucovorin in a days 1-5 every 28 day fashion.  Genetic Counseling is complete and is negative.  Oncology history is updated.  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  Pictures are taken and within this dictation.  Treatment held today. He is provided patient education regarding this complication.  Return in 2 weeks for follow-up.  Treatment is to be held until he is seen by a provider.

## 2015-05-14 ENCOUNTER — Inpatient Hospital Stay (HOSPITAL_COMMUNITY): Payer: Medicaid Other

## 2015-05-15 ENCOUNTER — Inpatient Hospital Stay (HOSPITAL_COMMUNITY): Payer: Medicaid Other

## 2015-05-16 ENCOUNTER — Inpatient Hospital Stay (HOSPITAL_COMMUNITY): Payer: Medicaid Other

## 2015-05-17 ENCOUNTER — Inpatient Hospital Stay (HOSPITAL_COMMUNITY): Payer: Medicaid Other

## 2015-05-22 ENCOUNTER — Other Ambulatory Visit (HOSPITAL_COMMUNITY): Payer: Self-pay | Admitting: Hematology & Oncology

## 2015-05-27 NOTE — Progress Notes (Signed)
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  INTERVAL HISTORY: Tom [name] 49 y.o. 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 Appearance is most likely to represent diffuse hepatic metastasis   07/30/2014 Initial Diagnosis Colon, biopsy, sigmoid - ADENOCARCINOMA.   07/31/2014 Tumor Marker CEA- 48.0   07/31/2014 Imaging Ct abd/pelvis- concerning for primary colorectal neoplasm in the mid to distal sigmoid colon, with lymphadenopathy in the sigmoid mesocolon, ileocolic mesentery, and retroperitoneum, as well as widespread metastatic disease to the liver   08/01/2014 Imaging CT chest- Pathologic thoracic, right hilar, and infrahilar adenopathy associated with a masslike region of possible consolidation in the right middle lobe, surrounding nodularity, and some scattered bilateral pulmonary nodules.   08/06/2014 - 12/10/2014 Chemotherapy FOLFOX.  Avastin added for cycle 2.  S/P 10 cycles.  Change to maintenance therapy secondary to positive response to therapy, increasing PN, and progressive proteinuria.   10/08/2014 Adverse Reaction Avastin induced proteinuria >/= 2 gm Avastin on hold   10/15/2014 Treatment Plan Change Avastin held for proteinuria.   10/22/2014 Imaging Ct CAP- 1. Interval decrease and soft tissue adjacent to the mid sigmoid colon with decreasing sigmoid colon wall thickening. The  leocolic, mesenteric,  perirectal, and mesocolonic lymphadenopathy has decreased in the interval.   10/30/2014 Survivorship Genetic Counseling. Genetic testing was normal, and did not reveal a deleterious mutation in these genes. The test report will be scanned into EPIC and will be located under the Media tab.    11/26/2014 Treatment Plan Change Oxaliplatin reduced x 25% due to PN.   12/24/2014 Treatment Plan Change Change to maintanence therapy.   01/09/2015 - 03/26/2015 Chemotherapy Xeloda 2500 mg BID, 7 days on and 7 days off.   01/30/2015 Imaging CT in ED- Evidence of distal small bowel obstruction with transition point over the ileum in the right upper pelvis immediately adjacent/abutting the known malignant stricturing of the sigmoid colon. There is wall thickening of the sigmoid colon wi...   01/30/2015 - 01/31/2015 Hospital Admission SBO (small bowel obstruction)   03/26/2015 Treatment Plan Change Xeloda cost-prohibitive without financial help   04/15/2015 -  Chemotherapy 5FU/Leucovorin days 1-5 every 28 days   05/13/2015 Adverse Reaction Palmar-Plantar erythrodysesthesia   05/13/2015 Treatment Plan Change 5FU/Leucovorin held   05/28/2015 Treatment Plan Change 5FU dose reduced by 20%    I personally reviewed and went over laboratory results with the patient.  The results are noted within this dictation.  Labs meet treatment parameters today.  His palms are healed.  No cracking identified.  No erythema noted either.  He denies any complaints related to his hands.  He soles are back to normal color.  Dry skin noted, but skin cracking has resolved.  He notes a left shin rash that is new.  He reports that it is erythematous and pruritic.  He denies any new cleaning products.  His wife also denies any new products.  He has been putting moisturizing lotion on  it.  He reports that it started about 2 weeks ago.  He continues with B/L ankle pitting edema.  It is stable to slightly improved.   Past Medical History    Diagnosis Date  . 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     has No Known Allergies.  Current Outpatient Prescriptions on File Prior to Visit  Medication Sig Dispense Refill  . dextrose 5 % SOLN 1,000 mL with fluorouracil 5 GM/100ML SOLN Inject into the vein. To be given on Days 1-5 every 28 days    . docusate sodium (COLACE) 100 MG capsule Take 1 capsule (100 mg total) by mouth 2 (two) times daily. 60 capsule 0  . Ferrous Sulfate 27 MG TABS Take 1 tablet by mouth 2 (two) times daily.    [name] LEUCOVORIN CALCIUM IJ Inject as directed. To be given on Days 1-5 every 28 days    . Multiple Vitamin (MULTIVITAMIN WITH MINERALS) TABS tablet Take 1 tablet by mouth daily.    . ondansetron (ZOFRAN) 8 MG tablet Take 8 mg by mouth every 8 (eight) hours as needed for nausea or vomiting.    [name] oxyCODONE-acetaminophen (PERCOCET/ROXICET) 5-325 MG per tablet Take 1-2 tablets by mouth every 4 (four) hours as needed for moderate pain. 30 tablet 0  . polyethylene glycol (MIRALAX / GLYCOLAX) packet Take 17 g by mouth daily as needed for mild constipation. 14 each 0  . prochlorperazine (COMPAZINE) 10 MG tablet Take 10 mg by mouth every 6 (six) hours as needed for nausea or vomiting.    . vitamin E 400 UNIT capsule Take 400 Units by mouth daily.     Current Facility-Administered Medications on File Prior to Visit  Medication Dose Route Frequency Provider Last Rate Last Dose  . sodium chloride 0.9 % injection 10 mL  10 mL Intravenous PRN Patrici Ranks, MD   10 mL at 08/08/14 1237    Past Surgical History  Procedure Laterality Date  . Esophagogastroduodenoscopy N/A 07/30/2014    Procedure: ESOPHAGOGASTRODUODENOSCOPY  (EGD);  Surgeon: Inda Castle, MD;  Location: [facility];  Service: Endoscopy;  Laterality: N/A;  . Colonoscopy N/A 07/30/2014    Procedure: COLONOSCOPY;  Surgeon: Inda Castle, MD;  Location: [facility] ENDOSCOPY;  Service: Endoscopy;  Laterality: N/A;  . Portacath placement Right 08/01/14  . Portacath placement N/A 08/01/2014    Procedure: INSERTION RIGHT IJ PORT-A-CATH WITH ULTRASOUND AND FLUORO;  Surgeon: Gayland Curry, MD;  Location: [facility] ORS;  Service: General;  Laterality: N/A;  . Operative ultrasound N/A 08/01/2014    Procedure: OPERATIVE ULTRASOUND;  Surgeon: Gayland Curry, MD;  Location: [facility] ORS;  Service: General;  Laterality: N/A;    Denies any headaches, dizziness, double vision, fevers, chills, night sweats, nausea, vomiting, diarrhea, constipation, chest pain, heart palpitations, shortness of breath, blood in stool, black tarry stool, urinary pain, urinary burning, urinary frequency, hematuria.   PHYSICAL EXAMINATION  ECOG PERFORMANCE STATUS: 0 - Asymptomatic  Filed Vitals:   05/28/15 0929  BP: 116/60  Pulse: 60  Temp: 97.8 F (36.6 C)  Resp: 18    GENERAL:alert, no distress, well nourished, well developed, comfortable, cooperative, smiling and accompanied by his wife today, sitting in a chemo-recliner waiting for chemotherapy approval (labs). SKIN: skin color,  texture, turgor are normal, no rashes or significant lesions HEAD: Normocephalic, No masses, lesions, tenderness or abnormalities EYES: normal, EOMI, Conjunctiva are pink and non-injected EARS: External ears normal OROPHARYNX:lips, buccal mucosa, and tongue normal and mucous membranes are moist  NECK: supple, no adenopathy, trachea midline LYMPH:  no palpable lymphadenopathy BREAST:not examined LUNGS: clear to auscultation  Bilaterally. HEART: regular rate & rhythm ABDOMEN:abdomen soft and normal bowel sounds BACK: Back symmetric, no curvature. EXTREMITIES:less then 2 second capillary refill, no skin discoloration,  no cyanosis, positive findings:  Left anterior tibial rash that is papular, erythematous, excoritated   NEURO: alert & oriented x 3 with fluent speech, no focal motor/sensory deficits, gait normal    LABORATORY DATA: CBC    Component Value Date/Time   WBC 5.9 05/28/2015 0940   RBC 4.08* 05/28/2015 0940   RBC 3.42* 07/28/2014 1440   HGB 12.7* 05/28/2015 0940   HCT 37.3* 05/28/2015 0940   PLT 225 05/28/2015 0940   MCV 91.4 05/28/2015 0940   MCH 31.1 05/28/2015 0940   MCHC 34.0 05/28/2015 0940   RDW 13.8 05/28/2015 0940   LYMPHSABS 1.5 05/28/2015 0940   MONOABS 0.5 05/28/2015 0940   EOSABS 0.2 05/28/2015 0940   BASOSABS 0.0 05/28/2015 0940      Chemistry      Component Value Date/Time   NA 140 05/28/2015 0940   K 3.8 05/28/2015 0940   CL 108 05/28/2015 0940   CO2 24 05/28/2015 0940   BUN 23* 05/28/2015 0940   CREATININE 0.89 05/28/2015 0940      Component Value Date/Time   CALCIUM 9.0 05/28/2015 0940   ALKPHOS 77 05/28/2015 0940   AST 22 05/28/2015 0940   ALT 19 05/28/2015 0940   BILITOT 0.4 05/28/2015 0940     Lab Results  Component Value Date   CEA 2.7 04/15/2015      PENDING LABS:   RADIOGRAPHIC STUDIES:  No results found.   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  As a result, he was switched to Xeloda 2500 mg BID, 7 days on and 7 days as a maintenance treatment.  In October 2016, this treatment became cost-prohibitive and therefore, on 04/15/2015, he was transitioned to treatment with 5FU/Leucovorin in a days 1-5 every 28 day fashion.  Genetic Counseling is complete and is negative.  Oncology history is updated.  His treatment was held x 2 weeks after he demonstrated some  signs of toxicity related to 5FU-namely palmar plantar erythrodysesthesia.  Pictures are noted on previous encounter dictation.  He will be coming to the clinic daily for treatment as planned.  He will keep [name] in the loop regarding this rash.  It does not appear to be vesicular in nature and does not appear to be zoster.  Other than pruritis, he denies any other complaints regarding this rash.  It is in a peculiar location.  He will use OTC hydrocortisone cream for symptomatic relief.  5FU dose reduced by 20%.  Return in 4 weeks for follow-up.    THERAPY PLAN:  Continue treatment as outlined above.  5FU is dose reduced by 20%.  All questions were answered. The patient knows to call the clinic with any problems, questions or concerns. We can certainly see the patient much  sooner if necessary.  Patient and plan discussed with Dr. Ancil Linsey and she is in agreement with the aforementioned.   This note is electronically signed by: Doy Mince 05/28/2015 12:31 PM

## 2015-05-27 NOTE — Assessment & Plan Note (Addendum)
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  As a result, he was switched to Xeloda 2500 mg BID, 7 days on and 7 days as a maintenance treatment.  In October 2016, this treatment became cost-prohibitive and therefore, on 04/15/2015, he was transitioned to treatment with 5FU/Leucovorin in a days 1-5 every 28 day fashion.  Genetic Counseling is complete and is negative.  Oncology history is updated.  His treatment was held x 2 weeks after he demonstrated some signs of toxicity related to 5FU-namely palmar plantar erythrodysesthesia.  Pictures are noted on previous encounter dictation.  He will be coming to the clinic daily for treatment as planned.  He will keep [name] in the loop regarding this rash.  It does not appear to be vesicular in nature and does not appear to be zoster.  Other than pruritis, he denies any other complaints regarding this rash.  It is in a peculiar location.  He will use OTC hydrocortisone cream for symptomatic relief.  5FU dose reduced by 20%.  Return in 4 weeks for follow-up.

## 2015-05-28 ENCOUNTER — Encounter (HOSPITAL_BASED_OUTPATIENT_CLINIC_OR_DEPARTMENT_OTHER): Payer: Medicaid Other | Admitting: Oncology

## 2015-05-28 ENCOUNTER — Encounter (HOSPITAL_BASED_OUTPATIENT_CLINIC_OR_DEPARTMENT_OTHER): Payer: Medicaid Other

## 2015-05-28 ENCOUNTER — Encounter (HOSPITAL_COMMUNITY): Payer: Self-pay | Admitting: Oncology

## 2015-05-28 VITALS — BP 98/52 | HR 55 | Temp 97.5°F | Resp 16

## 2015-05-28 VITALS — BP 116/60 | HR 60 | Temp 97.8°F | Resp 18 | Wt 207.4 lb

## 2015-05-28 DIAGNOSIS — C787 Secondary malignant neoplasm of liver and intrahepatic bile duct: Secondary | ICD-10-CM

## 2015-05-28 DIAGNOSIS — C189 Malignant neoplasm of colon, unspecified: Secondary | ICD-10-CM

## 2015-05-28 DIAGNOSIS — Z5111 Encounter for antineoplastic chemotherapy: Secondary | ICD-10-CM

## 2015-05-28 DIAGNOSIS — C187 Malignant neoplasm of sigmoid colon: Secondary | ICD-10-CM | POA: Diagnosis not present

## 2015-05-28 LAB — CBC WITH DIFFERENTIAL/PLATELET
Basophils Absolute: 0 10*3/uL (ref 0.0–0.1)
Basophils Relative: 0 %
EOS PCT: 3 %
Eosinophils Absolute: 0.2 10*3/uL (ref 0.0–0.7)
HEMATOCRIT: 37.3 % — AB (ref 39.0–52.0)
Hemoglobin: 12.7 g/dL — ABNORMAL LOW (ref 13.0–17.0)
LYMPHS ABS: 1.5 10*3/uL (ref 0.7–4.0)
LYMPHS PCT: 26 %
MCH: 31.1 pg (ref 26.0–34.0)
MCHC: 34 g/dL (ref 30.0–36.0)
MCV: 91.4 fL (ref 78.0–100.0)
MONO ABS: 0.5 10*3/uL (ref 0.1–1.0)
MONOS PCT: 9 %
NEUTROS ABS: 3.6 10*3/uL (ref 1.7–7.7)
Neutrophils Relative %: 62 %
PLATELETS: 225 10*3/uL (ref 150–400)
RBC: 4.08 MIL/uL — ABNORMAL LOW (ref 4.22–5.81)
RDW: 13.8 % (ref 11.5–15.5)
WBC: 5.9 10*3/uL (ref 4.0–10.5)

## 2015-05-28 LAB — COMPREHENSIVE METABOLIC PANEL
ALT: 19 U/L (ref 17–63)
ANION GAP: 8 (ref 5–15)
AST: 22 U/L (ref 15–41)
Albumin: 3.6 g/dL (ref 3.5–5.0)
Alkaline Phosphatase: 77 U/L (ref 38–126)
BILIRUBIN TOTAL: 0.4 mg/dL (ref 0.3–1.2)
BUN: 23 mg/dL — AB (ref 6–20)
CO2: 24 mmol/L (ref 22–32)
Calcium: 9 mg/dL (ref 8.9–10.3)
Chloride: 108 mmol/L (ref 101–111)
Creatinine, Ser: 0.89 mg/dL (ref 0.61–1.24)
Glucose, Bld: 136 mg/dL — ABNORMAL HIGH (ref 65–99)
POTASSIUM: 3.8 mmol/L (ref 3.5–5.1)
Sodium: 140 mmol/L (ref 135–145)
TOTAL PROTEIN: 6.5 g/dL (ref 6.5–8.1)

## 2015-05-28 MED ORDER — HEPARIN SOD (PORK) LOCK FLUSH 100 UNIT/ML IV SOLN
500.0000 [IU] | Freq: Once | INTRAVENOUS | Status: AC | PRN
  Administered 2015-05-28: 500 [IU]

## 2015-05-28 MED ORDER — SODIUM CHLORIDE 0.9 % IV SOLN
Freq: Once | INTRAVENOUS | Status: AC
  Administered 2015-05-28: 11:00:00 via INTRAVENOUS

## 2015-05-28 MED ORDER — PROCHLORPERAZINE MALEATE 10 MG PO TABS
10.0000 mg | ORAL_TABLET | Freq: Once | ORAL | Status: AC
  Administered 2015-05-28: 10 mg via ORAL

## 2015-05-28 MED ORDER — ONDANSETRON HCL 40 MG/20ML IJ SOLN
8.0000 mg | Freq: Once | INTRAMUSCULAR | Status: AC
  Administered 2015-05-28: 8 mg via INTRAVENOUS
  Filled 2015-05-28: qty 4

## 2015-05-28 MED ORDER — FLUOROURACIL CHEMO INJECTION 2.5 GM/50ML
340.0000 mg/m2 | Freq: Once | INTRAVENOUS | Status: AC
  Administered 2015-05-28: 700 mg via INTRAVENOUS
  Filled 2015-05-28: qty 14

## 2015-05-28 MED ORDER — LEUCOVORIN CALCIUM INJECTION 100 MG
20.0000 mg/m2 | Freq: Once | INTRAMUSCULAR | Status: AC
  Administered 2015-05-28: 42 mg via INTRAVENOUS
  Filled 2015-05-28: qty 2.1

## 2015-05-28 MED ORDER — PROCHLORPERAZINE MALEATE 10 MG PO TABS
ORAL_TABLET | ORAL | Status: AC
  Filled 2015-05-28: qty 1

## 2015-05-28 MED ORDER — HEPARIN SOD (PORK) LOCK FLUSH 100 UNIT/ML IV SOLN
INTRAVENOUS | Status: AC
  Filled 2015-05-28: qty 5

## 2015-05-28 MED ORDER — SODIUM CHLORIDE 0.9 % IJ SOLN
10.0000 mL | INTRAMUSCULAR | Status: DC | PRN
  Administered 2015-05-28: 10 mL
  Filled 2015-05-28: qty 10

## 2015-05-28 NOTE — Patient Instructions (Addendum)
[place] at [facility] Discharge Instructions  RECOMMENDATIONS MADE BY THE CONSULTANT AND ANY TEST RESULTS WILL BE SENT TO YOUR REFERRING PHYSICIAN.  Exam and discussion by Robynn Pane, PA-C Use hydrocortisone left shin rash Will decrease 5 FU dosage by 20 % Call with uncontrolled nausea, vomiting, diarrhea or other concerns.  Follow-Up: Chemo and office visit in 4 weeks.  Thank you for choosing [place] at [facility] to provide your oncology and hematology care.  To afford each patient quality time with our provider, please arrive at least 15 minutes before your scheduled appointment time.    You need to re-schedule your appointment should you arrive 10 or more minutes late.  We strive to give you quality time with our providers, and arriving late affects you and other patients whose appointments are after yours.  Also, if you no show three or more times for appointments you may be dismissed from the clinic at the providers discretion.     Again, thank you for choosing [facility].  Our hope is that these requests will decrease the amount of time that you wait before being seen by our physicians.       _____________________________________________________________  Should you have questions after your visit to [facility], please contact our office at [phone] between the hours of 8:30 a.m. and 4:30 p.m.  Voicemails left after 4:30 p.m. will not be returned until the following business day.  For prescription refill requests, have your pharmacy contact our office.

## 2015-05-28 NOTE — Progress Notes (Signed)
Tolerated chemo well. 

## 2015-05-29 ENCOUNTER — Inpatient Hospital Stay (HOSPITAL_COMMUNITY): Payer: Self-pay

## 2015-05-29 ENCOUNTER — Encounter (HOSPITAL_BASED_OUTPATIENT_CLINIC_OR_DEPARTMENT_OTHER): Payer: Medicaid Other

## 2015-05-29 VITALS — BP 95/53 | HR 56 | Temp 97.7°F | Resp 16

## 2015-05-29 DIAGNOSIS — C189 Malignant neoplasm of colon, unspecified: Secondary | ICD-10-CM

## 2015-05-29 DIAGNOSIS — C7800 Secondary malignant neoplasm of unspecified lung: Secondary | ICD-10-CM

## 2015-05-29 DIAGNOSIS — Z5111 Encounter for antineoplastic chemotherapy: Secondary | ICD-10-CM

## 2015-05-29 DIAGNOSIS — C787 Secondary malignant neoplasm of liver and intrahepatic bile duct: Secondary | ICD-10-CM

## 2015-05-29 LAB — CEA: CEA: 2.2 ng/mL (ref 0.0–4.7)

## 2015-05-29 MED ORDER — LEUCOVORIN CALCIUM INJECTION 100 MG
20.0000 mg/m2 | Freq: Once | INTRAMUSCULAR | Status: AC
  Administered 2015-05-29: 42 mg via INTRAVENOUS
  Filled 2015-05-29: qty 2.1

## 2015-05-29 MED ORDER — SODIUM CHLORIDE 0.9 % IV SOLN
8.0000 mg | Freq: Once | INTRAVENOUS | Status: AC
  Administered 2015-05-29: 8 mg via INTRAVENOUS
  Filled 2015-05-29: qty 4

## 2015-05-29 MED ORDER — PROCHLORPERAZINE MALEATE 10 MG PO TABS
ORAL_TABLET | ORAL | Status: AC
  Filled 2015-05-29: qty 1

## 2015-05-29 MED ORDER — PROCHLORPERAZINE MALEATE 10 MG PO TABS
10.0000 mg | ORAL_TABLET | Freq: Once | ORAL | Status: AC
  Administered 2015-05-29: 10 mg via ORAL

## 2015-05-29 MED ORDER — SODIUM CHLORIDE 0.9 % IV SOLN
Freq: Once | INTRAVENOUS | Status: AC
  Administered 2015-05-29: 09:00:00 via INTRAVENOUS

## 2015-05-29 MED ORDER — SODIUM CHLORIDE 0.9 % IJ SOLN
10.0000 mL | INTRAMUSCULAR | Status: DC | PRN
Start: 2015-05-29 — End: 2015-05-29
  Administered 2015-05-29: 10 mL
  Filled 2015-05-29: qty 10

## 2015-05-29 MED ORDER — HEPARIN SOD (PORK) LOCK FLUSH 100 UNIT/ML IV SOLN
500.0000 [IU] | Freq: Once | INTRAVENOUS | Status: AC | PRN
  Administered 2015-05-29: 500 [IU]

## 2015-05-29 MED ORDER — SODIUM CHLORIDE 0.9 % IV SOLN
340.0000 mg/m2 | Freq: Once | INTRAVENOUS | Status: AC
  Administered 2015-05-29: 700 mg via INTRAVENOUS
  Filled 2015-05-29: qty 14

## 2015-05-29 MED ORDER — HEPARIN SOD (PORK) LOCK FLUSH 100 UNIT/ML IV SOLN
INTRAVENOUS | Status: AC
  Filled 2015-05-29: qty 5

## 2015-05-29 NOTE — Progress Notes (Signed)
Tolerated chemo well. Ambulatory on discharge home with wife. 

## 2015-05-29 NOTE — Patient Instructions (Signed)
[facility] Discharge Instructions for Patients Receiving Chemotherapy  Today you received the following chemotherapy agents Leucovorin IV push and 5FU infusion Day 2.  To help prevent nausea and vomiting after your treatment, we encourage you to take your nausea medication as instructed. If you develop nausea and vomiting that is not controlled by your nausea medication, call the clinic. If it is after clinic hours your family physician or the after hours number for the clinic or go to the Emergency Department. BELOW ARE SYMPTOMS THAT SHOULD BE REPORTED IMMEDIATELY:  *FEVER GREATER THAN 101.0 F  *CHILLS WITH OR WITHOUT FEVER  NAUSEA AND VOMITING THAT IS NOT CONTROLLED WITH YOUR NAUSEA MEDICATION  *UNUSUAL SHORTNESS OF BREATH  *UNUSUAL BRUISING OR BLEEDING  TENDERNESS IN MOUTH AND THROAT WITH OR WITHOUT PRESENCE OF ULCERS  *URINARY PROBLEMS  *BOWEL PROBLEMS  UNUSUAL RASH Items with * indicate a potential emergency and should be followed up as soon as possible.  Return as scheduled.  I have been informed and understand all the instructions given to me. I know to contact the clinic, my physician, or go to the Emergency Department if any problems should occur. I do not have any questions at this time, but understand that I may call the clinic during office hours or the Patient Navigator at [phone] should I have any questions or need assistance in obtaining follow up care.    __________________________________________  _____________  __________ Signature of Patient or Authorized Representative            Date                   Time    __________________________________________ Nurse's Signature

## 2015-05-30 ENCOUNTER — Encounter (HOSPITAL_BASED_OUTPATIENT_CLINIC_OR_DEPARTMENT_OTHER): Payer: Medicaid Other

## 2015-05-30 ENCOUNTER — Encounter (HOSPITAL_COMMUNITY): Payer: Self-pay

## 2015-05-30 VITALS — BP 99/63 | HR 59 | Temp 97.7°F | Resp 18 | Wt 210.0 lb

## 2015-05-30 DIAGNOSIS — C787 Secondary malignant neoplasm of liver and intrahepatic bile duct: Secondary | ICD-10-CM

## 2015-05-30 DIAGNOSIS — C189 Malignant neoplasm of colon, unspecified: Secondary | ICD-10-CM

## 2015-05-30 DIAGNOSIS — Z5111 Encounter for antineoplastic chemotherapy: Secondary | ICD-10-CM

## 2015-05-30 MED ORDER — SODIUM CHLORIDE 0.9 % IV SOLN
8.0000 mg | Freq: Once | INTRAVENOUS | Status: AC
  Administered 2015-05-30: 8 mg via INTRAVENOUS
  Filled 2015-05-30: qty 4

## 2015-05-30 MED ORDER — PROCHLORPERAZINE MALEATE 10 MG PO TABS
10.0000 mg | ORAL_TABLET | Freq: Once | ORAL | Status: AC
  Administered 2015-05-30: 10 mg via ORAL

## 2015-05-30 MED ORDER — HEPARIN SOD (PORK) LOCK FLUSH 100 UNIT/ML IV SOLN
500.0000 [IU] | Freq: Once | INTRAVENOUS | Status: AC | PRN
  Administered 2015-05-30: 500 [IU]

## 2015-05-30 MED ORDER — LEUCOVORIN CALCIUM INJECTION 100 MG
20.0000 mg/m2 | Freq: Once | INTRAMUSCULAR | Status: AC
  Administered 2015-05-30: 42 mg via INTRAVENOUS
  Filled 2015-05-30: qty 2.1

## 2015-05-30 MED ORDER — SODIUM CHLORIDE 0.9 % IV SOLN
340.0000 mg/m2 | Freq: Once | INTRAVENOUS | Status: AC
  Administered 2015-05-30: 700 mg via INTRAVENOUS
  Filled 2015-05-30: qty 14

## 2015-05-30 MED ORDER — HEPARIN SOD (PORK) LOCK FLUSH 100 UNIT/ML IV SOLN
INTRAVENOUS | Status: AC
  Filled 2015-05-30: qty 5

## 2015-05-30 MED ORDER — SODIUM CHLORIDE 0.9 % IV SOLN
Freq: Once | INTRAVENOUS | Status: AC
  Administered 2015-05-30: 09:00:00 via INTRAVENOUS

## 2015-05-30 MED ORDER — PROCHLORPERAZINE MALEATE 10 MG PO TABS
ORAL_TABLET | ORAL | Status: AC
  Filled 2015-05-30: qty 1

## 2015-05-30 MED ORDER — SODIUM CHLORIDE 0.9 % IJ SOLN
10.0000 mL | INTRAMUSCULAR | Status: DC | PRN
  Administered 2015-05-30: 10 mL
  Filled 2015-05-30: qty 10

## 2015-05-30 NOTE — Progress Notes (Signed)
Tolerated chemo well. Ambulatory on discharge home with wife. 

## 2015-05-30 NOTE — Patient Instructions (Signed)
[facility] Discharge Instructions for Patients Receiving Chemotherapy  Today you received the following chemotherapy agents: leucovorin and fluorouracil.     If you develop nausea and vomiting, or diarrhea that is not controlled by your medication, call the clinic.  The clinic phone number is [phone]. Office hours are Monday-Friday 8:30am-5:00pm.  BELOW ARE SYMPTOMS THAT SHOULD BE REPORTED IMMEDIATELY:  *FEVER GREATER THAN 101.0 F  *CHILLS WITH OR WITHOUT FEVER  NAUSEA AND VOMITING THAT IS NOT CONTROLLED WITH YOUR NAUSEA MEDICATION  *UNUSUAL SHORTNESS OF BREATH  *UNUSUAL BRUISING OR BLEEDING  TENDERNESS IN MOUTH AND THROAT WITH OR WITHOUT PRESENCE OF ULCERS  *URINARY PROBLEMS  *BOWEL PROBLEMS  UNUSUAL RASH Items with * indicate a potential emergency and should be followed up as soon as possible. If you have an emergency after office hours please contact your primary care physician or go to the nearest emergency department.  Please call the clinic during office hours if you have any questions or concerns.   You may also contact the Patient Navigator at [phone] should you have any questions or need assistance in obtaining follow up care.

## 2015-05-31 ENCOUNTER — Encounter (HOSPITAL_BASED_OUTPATIENT_CLINIC_OR_DEPARTMENT_OTHER): Payer: Medicaid Other

## 2015-05-31 ENCOUNTER — Encounter (HOSPITAL_COMMUNITY): Payer: Self-pay

## 2015-05-31 VITALS — BP 107/65 | HR 57 | Temp 98.0°F | Resp 18 | Wt 210.4 lb

## 2015-05-31 DIAGNOSIS — C189 Malignant neoplasm of colon, unspecified: Secondary | ICD-10-CM | POA: Diagnosis not present

## 2015-05-31 DIAGNOSIS — Z5111 Encounter for antineoplastic chemotherapy: Secondary | ICD-10-CM

## 2015-05-31 DIAGNOSIS — C787 Secondary malignant neoplasm of liver and intrahepatic bile duct: Secondary | ICD-10-CM | POA: Diagnosis not present

## 2015-05-31 MED ORDER — SODIUM CHLORIDE 0.9 % IJ SOLN
10.0000 mL | INTRAMUSCULAR | Status: DC | PRN
  Administered 2015-05-31 (×2): 10 mL
  Filled 2015-05-31 (×2): qty 10

## 2015-05-31 MED ORDER — PROCHLORPERAZINE MALEATE 10 MG PO TABS
ORAL_TABLET | ORAL | Status: AC
  Filled 2015-05-31: qty 1

## 2015-05-31 MED ORDER — SODIUM CHLORIDE 0.9 % IV SOLN
8.0000 mg | Freq: Once | INTRAVENOUS | Status: AC
  Administered 2015-05-31: 8 mg via INTRAVENOUS
  Filled 2015-05-31: qty 4

## 2015-05-31 MED ORDER — PROCHLORPERAZINE MALEATE 10 MG PO TABS
10.0000 mg | ORAL_TABLET | Freq: Once | ORAL | Status: AC
  Administered 2015-05-31: 10 mg via ORAL

## 2015-05-31 MED ORDER — HEPARIN SOD (PORK) LOCK FLUSH 100 UNIT/ML IV SOLN
500.0000 [IU] | Freq: Once | INTRAVENOUS | Status: AC | PRN
  Administered 2015-05-31: 500 [IU]

## 2015-05-31 MED ORDER — HEPARIN SOD (PORK) LOCK FLUSH 100 UNIT/ML IV SOLN
INTRAVENOUS | Status: AC
  Filled 2015-05-31: qty 5

## 2015-05-31 MED ORDER — SODIUM CHLORIDE 0.9 % IV SOLN
340.0000 mg/m2 | Freq: Once | INTRAVENOUS | Status: AC
  Administered 2015-05-31: 700 mg via INTRAVENOUS
  Filled 2015-05-31: qty 14

## 2015-05-31 MED ORDER — LEUCOVORIN CALCIUM INJECTION 100 MG
20.0000 mg/m2 | Freq: Once | INTRAMUSCULAR | Status: AC
  Administered 2015-05-31: 42 mg via INTRAVENOUS
  Filled 2015-05-31: qty 2.1

## 2015-05-31 MED ORDER — SODIUM CHLORIDE 0.9 % IV SOLN
Freq: Once | INTRAVENOUS | Status: AC
  Administered 2015-05-31: 09:00:00 via INTRAVENOUS

## 2015-05-31 NOTE — Patient Instructions (Signed)
[place] at [facility] Discharge Instructions  RECOMMENDATIONS MADE BY THE CONSULTANT AND ANY TEST RESULTS WILL BE SENT TO YOUR REFERRING PHYSICIAN.  Exam and discussion by Robynn Pane, PA-C Use hydrocortisone left shin rash Will decrease 5 FU dosage by 20 % Call with uncontrolled nausea, vomiting, diarrhea or other concerns.  Follow-Up: Chemo and office visit in 4 weeks.  Thank you for choosing [place] at [facility] to provide your oncology and hematology care. To afford each patient quality time with our provider, please arrive at least 15 minutes before your scheduled appointment time.   You need to re-schedule your appointment should you arrive 10 or more minutes late. We strive to give you quality time with our providers, and arriving late affects you and other patients whose appointments are after yours. Also, if you no show three or more times for appointments you may be dismissed from the clinic at the providers discretion.   Again, thank you for choosing [facility]. Our hope is that these requests will decrease the amount of time that you wait before being seen by our physicians.  _____________________________________________________________  Should you have questions after your visit to [facility], please contact our office at [phone] between the hours of 8:30 a.m. and 4:30 p.m. Voicemails left after 4:30 p.m. will not be returned until the following business day. For prescription refill requests, have your pharmacy contact our office.

## 2015-05-31 NOTE — Progress Notes (Signed)
..  Tom [name] tolerated treatment well

## 2015-06-10 ENCOUNTER — Inpatient Hospital Stay (HOSPITAL_COMMUNITY): Payer: Medicaid Other

## 2015-06-10 ENCOUNTER — Ambulatory Visit (HOSPITAL_COMMUNITY): Payer: Medicaid Other | Admitting: Oncology

## 2015-06-11 ENCOUNTER — Inpatient Hospital Stay (HOSPITAL_COMMUNITY): Payer: Medicaid Other

## 2015-06-12 ENCOUNTER — Inpatient Hospital Stay (HOSPITAL_COMMUNITY): Payer: Medicaid Other

## 2015-06-13 ENCOUNTER — Inpatient Hospital Stay (HOSPITAL_COMMUNITY): Payer: Medicaid Other

## 2015-06-14 ENCOUNTER — Inpatient Hospital Stay (HOSPITAL_COMMUNITY): Payer: Medicaid Other

## 2015-06-25 ENCOUNTER — Encounter: Payer: Self-pay | Admitting: *Deleted

## 2015-06-25 ENCOUNTER — Encounter (HOSPITAL_COMMUNITY): Payer: Self-pay | Admitting: Hematology & Oncology

## 2015-06-25 ENCOUNTER — Encounter (HOSPITAL_BASED_OUTPATIENT_CLINIC_OR_DEPARTMENT_OTHER): Payer: Medicaid Other

## 2015-06-25 ENCOUNTER — Encounter (HOSPITAL_COMMUNITY): Payer: Medicaid Other | Attending: Hematology & Oncology | Admitting: Hematology & Oncology

## 2015-06-25 VITALS — BP 110/71 | HR 58 | Temp 97.9°F | Resp 18 | Wt 212.8 lb

## 2015-06-25 DIAGNOSIS — C187 Malignant neoplasm of sigmoid colon: Secondary | ICD-10-CM | POA: Insufficient documentation

## 2015-06-25 DIAGNOSIS — C189 Malignant neoplasm of colon, unspecified: Secondary | ICD-10-CM

## 2015-06-25 DIAGNOSIS — Z5111 Encounter for antineoplastic chemotherapy: Secondary | ICD-10-CM | POA: Diagnosis present

## 2015-06-25 DIAGNOSIS — C787 Secondary malignant neoplasm of liver and intrahepatic bile duct: Secondary | ICD-10-CM | POA: Diagnosis not present

## 2015-06-25 LAB — CBC WITH DIFFERENTIAL/PLATELET
BASOS PCT: 0 %
Basophils Absolute: 0 10*3/uL (ref 0.0–0.1)
EOS ABS: 0.2 10*3/uL (ref 0.0–0.7)
EOS PCT: 4 %
HCT: 39.1 % (ref 39.0–52.0)
Hemoglobin: 13.7 g/dL (ref 13.0–17.0)
Lymphocytes Relative: 29 %
Lymphs Abs: 1.9 10*3/uL (ref 0.7–4.0)
MCH: 30.9 pg (ref 26.0–34.0)
MCHC: 35 g/dL (ref 30.0–36.0)
MCV: 88.1 fL (ref 78.0–100.0)
MONO ABS: 0.6 10*3/uL (ref 0.1–1.0)
MONOS PCT: 8 %
Neutro Abs: 3.9 10*3/uL (ref 1.7–7.7)
Neutrophils Relative %: 59 %
Platelets: 206 10*3/uL (ref 150–400)
RBC: 4.44 MIL/uL (ref 4.22–5.81)
RDW: 13.4 % (ref 11.5–15.5)
WBC: 6.6 10*3/uL (ref 4.0–10.5)

## 2015-06-25 LAB — COMPREHENSIVE METABOLIC PANEL
ALBUMIN: 3.8 g/dL (ref 3.5–5.0)
ALT: 30 U/L (ref 17–63)
ANION GAP: 8 (ref 5–15)
AST: 31 U/L (ref 15–41)
Alkaline Phosphatase: 91 U/L (ref 38–126)
BUN: 16 mg/dL (ref 6–20)
CO2: 27 mmol/L (ref 22–32)
Calcium: 9.2 mg/dL (ref 8.9–10.3)
Chloride: 104 mmol/L (ref 101–111)
Creatinine, Ser: 0.82 mg/dL (ref 0.61–1.24)
GFR calc non Af Amer: >60 mL/min (ref >60)
GLUCOSE: 90 mg/dL (ref 65–99)
POTASSIUM: 4.2 mmol/L (ref 3.5–5.1)
SODIUM: 139 mmol/L (ref 135–145)
TOTAL PROTEIN: 6.7 g/dL (ref 6.5–8.1)
Total Bilirubin: 0.5 mg/dL (ref 0.3–1.2)

## 2015-06-25 MED ORDER — SODIUM CHLORIDE 0.9 % IV SOLN
Freq: Once | INTRAVENOUS | Status: AC
Start: 2015-06-25 — End: 2015-06-25
  Administered 2015-06-25: 12:00:00 via INTRAVENOUS

## 2015-06-25 MED ORDER — SODIUM CHLORIDE 0.9 % IJ SOLN: 10.0000 mL | INTRAMUSCULAR | Status: DC | PRN

## 2015-06-25 MED ORDER — SODIUM CHLORIDE 0.9 % IV SOLN
340.0000 mg/m2 | Freq: Once | INTRAVENOUS | Status: AC
  Administered 2015-06-25: 700 mg via INTRAVENOUS
  Filled 2015-06-25: qty 14

## 2015-06-25 MED ORDER — SODIUM CHLORIDE 0.9 % IV SOLN
8.0000 mg | Freq: Once | INTRAVENOUS | Status: AC
  Administered 2015-06-25: 8 mg via INTRAVENOUS
  Filled 2015-06-25: qty 4

## 2015-06-25 MED ORDER — PROCHLORPERAZINE MALEATE 10 MG PO TABS
10.0000 mg | ORAL_TABLET | Freq: Once | ORAL | Status: AC
  Administered 2015-06-25: 10 mg via ORAL
  Filled 2015-06-25: qty 1

## 2015-06-25 MED ORDER — HEPARIN SOD (PORK) LOCK FLUSH 100 UNIT/ML IV SOLN
500.0000 [IU] | Freq: Once | INTRAVENOUS | Status: AC | PRN
  Administered 2015-06-25: 500 [IU]
  Filled 2015-06-25: qty 5

## 2015-06-25 MED ORDER — LEUCOVORIN CALCIUM INJECTION 100 MG
20.0000 mg/m2 | Freq: Once | INTRAMUSCULAR | Status: AC
  Administered 2015-06-25: 42 mg via INTRAVENOUS
  Filled 2015-06-25: qty 2.1

## 2015-06-25 MED ORDER — SODIUM CHLORIDE 0.9 % IJ SOLN
10.0000 mL | Freq: Once | INTRAMUSCULAR | Status: AC
  Administered 2015-06-25: 10 mL via INTRAVENOUS

## 2015-06-25 NOTE — Progress Notes (Signed)
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 Last CT scans of the abdomen and pelvis was on 8/31. Last CEA values have been WNL. Weight today is 212 lbs up from the 170's from his initial diagnosis.   Tom [name] is accompanied by his wife today and here for chemotherapy today. He was receiving treatment during our visit.   He is doing well with no issues. He had some problems with hand/foot redness/soreness on 5-FU but notes this has been alleviated since he has undergone a dose reduction. He is active. His appetite is excellent. He has no problems with his ostomy. No mouth sores.      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 Appearance is most likely to represent diffuse hepatic metastasis   07/30/2014 Initial Diagnosis Colon, biopsy, sigmoid - ADENOCARCINOMA.   07/31/2014 Tumor Marker CEA- 48.0   07/31/2014 Imaging Ct abd/pelvis- concerning for primary colorectal neoplasm in the mid to distal sigmoid colon, with lymphadenopathy in the sigmoid mesocolon, ileocolic mesentery, and retroperitoneum, as well as widespread metastatic disease to the liver   08/01/2014 Imaging CT chest- Pathologic thoracic, right hilar, and infrahilar adenopathy associated with a masslike region of possible consolidation in the right middle lobe, surrounding nodularity, and some scattered bilateral pulmonary nodules.   08/06/2014 - 12/10/2014  Chemotherapy FOLFOX.  Avastin added for cycle 2.  S/P 10 cycles.  Change to maintenance therapy secondary to positive response to therapy, increasing PN, and progressive proteinuria.   10/08/2014 Adverse Reaction Avastin induced proteinuria >/= 2 gm Avastin on hold   10/15/2014 Treatment Plan Change Avastin held for proteinuria.   10/22/2014 Imaging Ct CAP- 1. Interval decrease and soft tissue adjacent to the mid sigmoid colon with decreasing sigmoid colon wall thickening. The  leocolic, mesenteric, perirectal, and mesocolonic lymphadenopathy has decreased in the interval.   10/30/2014 Survivorship Genetic Counseling. Genetic testing was normal, and did not reveal a deleterious mutation in these genes. The test report will be scanned into EPIC and will be located under the Media tab.    11/26/2014 Treatment Plan Change Oxaliplatin reduced x 25% due to PN.   12/24/2014 Treatment Plan Change Change to maintanence therapy.   01/09/2015 - 03/26/2015 Chemotherapy Xeloda 2500 mg BID, 7 days on and 7 days off.   01/30/2015 Imaging CT in ED- Evidence of distal small bowel obstruction with transition point over the ileum in the right upper pelvis immediately adjacent/abutting the known malignant stricturing of the sigmoid colon. There is wall thickening of the sigmoid colon wi...   01/30/2015 - 01/31/2015 Hospital Admission SBO (small bowel obstruction)   03/26/2015 Treatment Plan Change Xeloda cost-prohibitive without financial help         I personally reviewed and went over laboratory results with the patient.  The results are noted within this dictation.  Past Medical History  Diagnosis Date  . 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     has No Known Allergies.  We administered Influenza vac split quadrivalent PF, heparin lock flush, and sodium chloride.  Past Surgical History  Procedure Laterality Date  . Esophagogastroduodenoscopy N/A 07/30/2014    Procedure: ESOPHAGOGASTRODUODENOSCOPY (EGD);  Surgeon: Inda Castle, MD;  Location: [facility];  Service: Endoscopy;  Laterality: N/A;  . Colonoscopy N/A 07/30/2014    Procedure: COLONOSCOPY;  Surgeon: Inda Castle, MD;  Location: [facility];  Service: Endoscopy;  Laterality: N/A;  . Portacath placement Right 08/01/14  . Portacath placement N/A 08/01/2014    Procedure: INSERTION RIGHT IJ PORT-A-CATH WITH ULTRASOUND AND FLUORO;  Surgeon: Gayland Curry, MD;  Location: [facility] ORS;  Service: General;  Laterality: N/A;  . Operative ultrasound N/A 08/01/2014    Procedure: OPERATIVE ULTRASOUND;  Surgeon: Gayland Curry, MD;  Location: [facility];  Service: General;  Laterality: N/A;    Denies any headaches, dizziness, double vision, fevers, chills, night sweats, nausea, vomiting, diarrhea, chest pain, heart palpitations, shortness of breath, blood in stool, black tarry stool, urinary pain, urinary burning, urinary frequency, hematuria. Positive for weight gain. Positive for rash.   LLE only 14 point review of systems was performed and is negative except as detailed under history of present illness and above  PHYSICAL EXAMINATION Vitals - 1 value per visit 7/62/2633  SYSTOLIC 354  DIASTOLIC 71  Pulse 58  Temperature 97.9  Respirations 18  Weight (lb) 212.8  Height   BMI 32.36  VISIT REPORT     ECOG PERFORMANCE STATUS: 0 - Asymptomatic   GENERAL:alert, no distress, well nourished, well developed, comfortable, cooperative and smiling, accompanied by his wife. SKIN: skin color, texture, turgor are normal, no rashes or significant lesions HEAD: Normocephalic, No masses, lesions, tenderness or abnormalities EYES: normal, PERRLA, EOMI, Conjunctiva are pink and  non-injected EARS: External ears normal OROPHARYNX:lips, buccal mucosa, and tongue normal and mucous membranes are moist  NECK: supple, trachea midline LYMPH:  No palpable adenopathy in the neck or supraclavicular regions, no axillary adenopathy BREAST:not examined LUNGS: Air to auscultation bilaterally with no wheezing or rhonchi HEART: S1 and S2 audible, regular, no ectopy ABDOMEN:abdomen soft and normal bowel sounds. No palpable liver edge. BACK: Back symmetric, no curvature. EXTREMITIES:less then 2 second capillary refill, no joint deformities, effusion, or inflammation, no cyanosis Mild redness/rash on LLE.  NEURO: alert & oriented x 3 with fluent speech, no focal motor/sensory deficits, gait normal   LABORATORY DATA: I have reviewed the data below as listed. Results for Tom [name], Tom [name] (MRN [id number]) as of 06/25/2015 16:56  Ref. Range 06/25/2015 09:58  Sodium Latest Ref Range: 135-145 mmol/L 139  Potassium Latest Ref Range: 3.5-5.1 mmol/L 4.2  Chloride Latest Ref Range: 101-111 mmol/L 104  CO2 Latest Ref Range: 22-32 mmol/L 27  BUN Latest Ref Range: 6-20 mg/dL 16  Creatinine Latest Ref Range: 0.61-1.24 mg/dL 0.82  Calcium Latest Ref Range: 8.9-10.3 mg/dL 9.2  EGFR (Non-African Amer.) Latest Ref Range: >60 mL/min >60  EGFR (African American) Latest Ref Range: >60 mL/min >60  Glucose Latest Ref Range: 65-99 mg/dL 90  Anion gap Latest Ref Range: 5-15  8  Alkaline Phosphatase Latest Ref Range: 38-126 U/L 91  Albumin Latest Ref Range: 3.5-5.0 g/dL 3.8  AST Latest Ref Range: 15-41 U/L 31  ALT Latest Ref Range: 17-63 U/L 30  Total Protein Latest Ref Range: 6.5-8.1 g/dL 6.7  Total Bilirubin Latest Ref Range: 0.3-1.2 mg/dL 0.5  WBC Latest Ref Range: 4.0-10.5 K/uL 6.6  RBC Latest Ref Range: 4.22-5.81 MIL/uL 4.44  Hemoglobin Latest Ref Range: 13.0-17.0 g/dL 13.7  HCT Latest Ref Range: 39.0-52.0 % 39.1  MCV Latest Ref Range: 78.0-100.0 fL 88.1  MCH Latest Ref Range: 26.0-34.0 pg  30.9  MCHC Latest Ref Range: 30.0-36.0 g/dL 35.0  RDW Latest Ref Range: 11.5-15.5 % 13.4  Platelets Latest Ref Range: 150-400 K/uL 206  Neutrophils Latest Units: % 59  Lymphocytes Latest Units: % 29  Monocytes Relative Latest Units: % 8  Eosinophil Latest Units: % 4  Basophil Latest Units: % 0  NEUT# Latest Ref Range: 1.7-7.7 K/uL 3.9  Lymphocyte # Latest Ref Range: 0.7-4.0 K/uL 1.9  Monocyte # Latest Ref Range: 0.1-1.0 K/uL 0.6  Eosinophils Absolute Latest Ref Range: 0.0-0.7 K/uL 0.2  Basophils Absolute Latest Ref Range: 0.0-0.1 K/uL 0.0      ASSESSMENT AND PLAN:  Stage IV colon cancer, KRAS WT  50 year old male with stage IV colon cancer he has had a significant improvement in his disease and clinical status.He is currently on single agent 5-FU with excellent tolerance. His PS is excellent. CEA is WNL.  Unfortunately AVASTIN had to be discontinued secondary to persistent proteinuria.    We are going to repeat staging studies, last CT scans were in August of last year.  Genetics Counseling  He has completed genetics counseling. His genetic test results were normal--i.e. testing found no meaningful changes to any of 19 genes associated with increased risks for colorectal and other types of cancers.  Nephrotic Range Proteinuria  He has been off of Avastin which is most commonly implicated in proteinuria since May 2. He has been seen by nephrology.   THERAPY PLAN:  Continue therapy a planned   All questions were answered. The patient knows to call the clinic with any problems, questions or concerns. We can certainly see the patient much sooner if necessary.   This document serves as a record of services personally performed by Ancil Linsey, MD. It was created on her behalf by Arlyce Harman, a trained medical scribe. The creation of this record is based on the scribe's personal observations and the provider's statements to them. This document has been checked and approved by  the attending provider.  I have reviewed the above documentation for accuracy and completeness, and I agree with the above.  This note was electronically signed.  Kelby Fam. Whitney Muse, MD

## 2015-06-25 NOTE — Patient Instructions (Addendum)
[place] at [facility] Discharge Instructions  RECOMMENDATIONS MADE BY THE CONSULTANT AND ANY TEST RESULTS WILL BE SENT TO YOUR REFERRING PHYSICIAN.    Exam and discussion by Dr Whitney Muse today. Scans next week. Return the rest of the week for chemotherapy. Return in 1 month to see the doctor and for chemotherapy. Please call the clinic if you have any questions or concerns.    Thank you for choosing [place] at [facility] to provide your oncology and hematology care.  To afford each patient quality time with our provider, please arrive at least 15 minutes before your scheduled appointment time.   Beginning January 23rd 2017 lab work for the [name] will be done in the  Main lab at [facility] on 1st floor. If you have a lab appointment with the [facility] please come in thru the  Main Entrance and check in at the main information desk  You need to re-schedule your appointment should you arrive 10 or more minutes late.  We strive to give you quality time with our providers, and arriving late affects you and other patients whose appointments are after yours.  Also, if you no show three or more times for appointments you may be dismissed from the clinic at the providers discretion.     Again, thank you for choosing [facility].  Our hope is that these requests will decrease the amount of time that you wait before being seen by our physicians.       _____________________________________________________________  Should you have questions after your visit to [facility], please contact our office at [phone] between the hours of 8:30 a.m. and 4:30 p.m.  Voicemails left after 4:30 p.m. will not be returned until the following business day.  For prescription refill requests, have your pharmacy contact our office.

## 2015-06-25 NOTE — Patient Instructions (Signed)
..  [facility] Discharge Instructions for Patients Receiving Chemotherapy   Beginning January 23rd 2017 lab work for the Cancer Center will be done in the  Main lab at [place] on 1st floor. If you have a lab appointment with the Cancer Center please come in thru the  Main Entrance and check in at the main information desk   Today you received the following chemotherapy agents 5fu and leucovorin     If you develop nausea and vomiting, or diarrhea that is not controlled by your medication, call the clinic.  The clinic phone number is [phone]. Office hours are Monday-Friday 8:30am-5:00pm.  BELOW ARE SYMPTOMS THAT SHOULD BE REPORTED IMMEDIATELY:  *FEVER GREATER THAN 101.0 F  *CHILLS WITH OR WITHOUT FEVER  NAUSEA AND VOMITING THAT IS NOT CONTROLLED WITH YOUR NAUSEA MEDICATION  *UNUSUAL SHORTNESS OF BREATH  *UNUSUAL BRUISING OR BLEEDING  TENDERNESS IN MOUTH AND THROAT WITH OR WITHOUT PRESENCE OF ULCERS  *URINARY PROBLEMS  *BOWEL PROBLEMS  UNUSUAL RASH Items with * indicate a potential emergency and should be followed up as soon as possible. If you have an emergency after office hours please contact your primary care physician or go to the nearest emergency department.  Please call the clinic during office hours if you have any questions or concerns.   You may also contact the Patient Navigator at [phone] should you have any questions or need assistance in obtaining follow up care.            

## 2015-06-25 NOTE — Progress Notes (Signed)
[place] Clinical Social Work  Clinical Social Work was referred by [place] rounding. CSW has only met with pt briefly once before. Clinical Social Worker met with patient at [facility] to offer support and assess for needs.  CSW introduced self and explained role of CSW. CSW provided pt with information sheet about CSW role and also Support Program/Resource sheet. Pt carefully reviewed information provided and denied concerns currently.  Pt stated understanding and agrees to reach out as needed.  There were other people in the room today and it was not appropriate to further discuss other needs. Pt denied having concerns.    Clinical Social Work interventions: Catering manager and referral  Loren Racer, [place] Tues[facility]

## 2015-06-25 NOTE — Progress Notes (Signed)
..  [name] arrived today for daily 32fu/leucovorin x 5 days. Labs discussed with Dr. Whitney Muse and patient seen by MD. Treatment started. Patient tolerated well.

## 2015-06-26 ENCOUNTER — Encounter (HOSPITAL_BASED_OUTPATIENT_CLINIC_OR_DEPARTMENT_OTHER): Payer: Medicaid Other

## 2015-06-26 VITALS — BP 116/82 | HR 57 | Temp 98.1°F | Resp 18

## 2015-06-26 DIAGNOSIS — Z5111 Encounter for antineoplastic chemotherapy: Secondary | ICD-10-CM

## 2015-06-26 DIAGNOSIS — C787 Secondary malignant neoplasm of liver and intrahepatic bile duct: Secondary | ICD-10-CM | POA: Diagnosis not present

## 2015-06-26 DIAGNOSIS — C189 Malignant neoplasm of colon, unspecified: Secondary | ICD-10-CM | POA: Diagnosis not present

## 2015-06-26 LAB — CEA: CEA: 1.4 ng/mL (ref 0.0–4.7)

## 2015-06-26 MED ORDER — LEUCOVORIN CALCIUM INJECTION 100 MG
20.0000 mg/m2 | Freq: Once | INTRAMUSCULAR | Status: AC
  Administered 2015-06-26: 42 mg via INTRAVENOUS
  Filled 2015-06-26: qty 2.1

## 2015-06-26 MED ORDER — SODIUM CHLORIDE 0.9 % IV SOLN
Freq: Once | INTRAVENOUS | Status: AC
  Administered 2015-06-26: 11:00:00 via INTRAVENOUS

## 2015-06-26 MED ORDER — HEPARIN SOD (PORK) LOCK FLUSH 100 UNIT/ML IV SOLN
500.0000 [IU] | Freq: Once | INTRAVENOUS | Status: AC | PRN
  Administered 2015-06-26: 500 [IU]

## 2015-06-26 MED ORDER — HEPARIN SOD (PORK) LOCK FLUSH 100 UNIT/ML IV SOLN
INTRAVENOUS | Status: AC
  Filled 2015-06-26: qty 5

## 2015-06-26 MED ORDER — SODIUM CHLORIDE 0.9 % IJ SOLN
10.0000 mL | INTRAMUSCULAR | Status: DC | PRN
  Administered 2015-06-26: 10 mL
  Filled 2015-06-26: qty 10

## 2015-06-26 MED ORDER — PROCHLORPERAZINE MALEATE 10 MG PO TABS
10.0000 mg | ORAL_TABLET | Freq: Once | ORAL | Status: AC
  Administered 2015-06-26: 10 mg via ORAL

## 2015-06-26 MED ORDER — SODIUM CHLORIDE 0.9 % IV SOLN
8.0000 mg | Freq: Once | INTRAVENOUS | Status: AC
  Administered 2015-06-26: 8 mg via INTRAVENOUS
  Filled 2015-06-26: qty 4

## 2015-06-26 MED ORDER — PROCHLORPERAZINE MALEATE 10 MG PO TABS
ORAL_TABLET | ORAL | Status: AC
  Filled 2015-06-26: qty 1

## 2015-06-26 MED ORDER — SODIUM CHLORIDE 0.9 % IV SOLN
340.0000 mg/m2 | Freq: Once | INTRAVENOUS | Status: AC
  Administered 2015-06-26: 700 mg via INTRAVENOUS
  Filled 2015-06-26: qty 14

## 2015-06-26 NOTE — Patient Instructions (Signed)
[facility] Discharge Instructions for Patients Receiving Chemotherapy  Today you received the following chemotherapy agents Leucovorin and 5FU.  To help prevent nausea and vomiting after your treatment, we encourage you to take your nausea medication as instructed. If you develop nausea and vomiting that is not controlled by your nausea medication, call the clinic. If it is after clinic hours your family physician or the after hours number for the clinic or go to the Emergency Department. BELOW ARE SYMPTOMS THAT SHOULD BE REPORTED IMMEDIATELY:  *FEVER GREATER THAN 101.0 F  *CHILLS WITH OR WITHOUT FEVER  NAUSEA AND VOMITING THAT IS NOT CONTROLLED WITH YOUR NAUSEA MEDICATION  *UNUSUAL SHORTNESS OF BREATH  *UNUSUAL BRUISING OR BLEEDING  TENDERNESS IN MOUTH AND THROAT WITH OR WITHOUT PRESENCE OF ULCERS  *URINARY PROBLEMS  *BOWEL PROBLEMS  UNUSUAL RASH Items with * indicate a potential emergency and should be followed up as soon as possible.  Return as scheduled.  I have been informed and understand all the instructions given to me. I know to contact the clinic, my physician, or go to the Emergency Department if any problems should occur. I do not have any questions at this time, but understand that I may call the clinic during office hours or the Patient Navigator at [phone] should I have any questions or need assistance in obtaining follow up care.    __________________________________________  _____________  __________ Signature of Patient or Authorized Representative            Date                   Time    __________________________________________ Nurse's Signature  

## 2015-06-26 NOTE — Progress Notes (Signed)
Denies complaints post chemo yesterday.  Tolerated chemo well. Ambulatory on discharge home with wife. 

## 2015-06-27 ENCOUNTER — Encounter (HOSPITAL_BASED_OUTPATIENT_CLINIC_OR_DEPARTMENT_OTHER): Payer: Medicaid Other

## 2015-06-27 VITALS — BP 104/57 | HR 65 | Temp 98.2°F | Resp 18 | Wt 212.0 lb

## 2015-06-27 DIAGNOSIS — Z5111 Encounter for antineoplastic chemotherapy: Secondary | ICD-10-CM

## 2015-06-27 DIAGNOSIS — C787 Secondary malignant neoplasm of liver and intrahepatic bile duct: Secondary | ICD-10-CM

## 2015-06-27 DIAGNOSIS — C189 Malignant neoplasm of colon, unspecified: Secondary | ICD-10-CM | POA: Diagnosis not present

## 2015-06-27 MED ORDER — HEPARIN SOD (PORK) LOCK FLUSH 100 UNIT/ML IV SOLN
INTRAVENOUS | Status: AC
  Filled 2015-06-27: qty 5

## 2015-06-27 MED ORDER — SODIUM CHLORIDE 0.9 % IV SOLN
Freq: Once | INTRAVENOUS | Status: AC
  Administered 2015-06-27: 11:00:00 via INTRAVENOUS

## 2015-06-27 MED ORDER — SODIUM CHLORIDE 0.9 % IV SOLN
340.0000 mg/m2 | Freq: Once | INTRAVENOUS | Status: AC
  Administered 2015-06-27: 700 mg via INTRAVENOUS
  Filled 2015-06-27: qty 14

## 2015-06-27 MED ORDER — PROCHLORPERAZINE MALEATE 10 MG PO TABS
10.0000 mg | ORAL_TABLET | Freq: Once | ORAL | Status: AC
  Administered 2015-06-27: 10 mg via ORAL

## 2015-06-27 MED ORDER — LEUCOVORIN CALCIUM INJECTION 100 MG
20.0000 mg/m2 | Freq: Once | INTRAMUSCULAR | Status: AC
  Administered 2015-06-27: 42 mg via INTRAVENOUS
  Filled 2015-06-27: qty 2.1

## 2015-06-27 MED ORDER — SODIUM CHLORIDE 0.9 % IV SOLN
8.0000 mg | Freq: Once | INTRAVENOUS | Status: AC
  Administered 2015-06-27: 8 mg via INTRAVENOUS
  Filled 2015-06-27: qty 4

## 2015-06-27 MED ORDER — HEPARIN SOD (PORK) LOCK FLUSH 100 UNIT/ML IV SOLN
500.0000 [IU] | Freq: Once | INTRAVENOUS | Status: AC | PRN
  Administered 2015-06-27: 500 [IU]

## 2015-06-27 MED ORDER — PROCHLORPERAZINE MALEATE 10 MG PO TABS
ORAL_TABLET | ORAL | Status: AC
  Filled 2015-06-27: qty 1

## 2015-06-27 MED ORDER — SODIUM CHLORIDE 0.9 % IJ SOLN
10.0000 mL | INTRAMUSCULAR | Status: DC | PRN
  Administered 2015-06-27: 10 mL
  Filled 2015-06-27: qty 10

## 2015-06-27 NOTE — Patient Instructions (Signed)
[facility] Discharge Instructions for Patients Receiving Chemotherapy  Today you received the following chemotherapy agents Leucovorin and 5FU day 3 of 5.  To help prevent nausea and vomiting after your treatment, we encourage you to take your nausea medication as instructed. If you develop nausea and vomiting that is not controlled by your nausea medication, call the clinic. If it is after clinic hours your family physician or the after hours number for the clinic or go to the Emergency Department. BELOW ARE SYMPTOMS THAT SHOULD BE REPORTED IMMEDIATELY:  *FEVER GREATER THAN 101.0 F  *CHILLS WITH OR WITHOUT FEVER  NAUSEA AND VOMITING THAT IS NOT CONTROLLED WITH YOUR NAUSEA MEDICATION  *UNUSUAL SHORTNESS OF BREATH  *UNUSUAL BRUISING OR BLEEDING  TENDERNESS IN MOUTH AND THROAT WITH OR WITHOUT PRESENCE OF ULCERS  *URINARY PROBLEMS  *BOWEL PROBLEMS  UNUSUAL RASH Items with * indicate a potential emergency and should be followed up as soon as possible.  Return as scheduled.  I have been informed and understand all the instructions given to me. I know to contact the clinic, my physician, or go to the Emergency Department if any problems should occur. I do not have any questions at this time, but understand that I may call the clinic during office hours or the Patient Navigator at [phone] should I have any questions or need assistance in obtaining follow up care.    __________________________________________  _____________  __________ Signature of Patient or Authorized Representative            Date                   Time    __________________________________________ Nurse's Signature

## 2015-06-27 NOTE — Progress Notes (Signed)
Denies any complaints or issues post chemo yesterday.  Tolerated chemo well. Ambulatory on discharge home to self.

## 2015-06-28 ENCOUNTER — Encounter (HOSPITAL_BASED_OUTPATIENT_CLINIC_OR_DEPARTMENT_OTHER): Payer: Medicaid Other

## 2015-06-28 VITALS — BP 98/61 | HR 56 | Temp 98.0°F | Resp 16

## 2015-06-28 DIAGNOSIS — Z5111 Encounter for antineoplastic chemotherapy: Secondary | ICD-10-CM | POA: Diagnosis present

## 2015-06-28 DIAGNOSIS — C787 Secondary malignant neoplasm of liver and intrahepatic bile duct: Secondary | ICD-10-CM

## 2015-06-28 DIAGNOSIS — C189 Malignant neoplasm of colon, unspecified: Secondary | ICD-10-CM | POA: Diagnosis not present

## 2015-06-28 MED ORDER — HEPARIN SOD (PORK) LOCK FLUSH 100 UNIT/ML IV SOLN
500.0000 [IU] | Freq: Once | INTRAVENOUS | Status: AC | PRN
  Administered 2015-06-28: 500 [IU]

## 2015-06-28 MED ORDER — SODIUM CHLORIDE 0.9 % IV SOLN
Freq: Once | INTRAVENOUS | Status: AC
  Administered 2015-06-28: 11:00:00 via INTRAVENOUS

## 2015-06-28 MED ORDER — HEPARIN SOD (PORK) LOCK FLUSH 100 UNIT/ML IV SOLN
INTRAVENOUS | Status: AC
  Filled 2015-06-28: qty 5

## 2015-06-28 MED ORDER — SODIUM CHLORIDE 0.9 % IV SOLN
340.0000 mg/m2 | Freq: Once | INTRAVENOUS | Status: AC
  Administered 2015-06-28: 700 mg via INTRAVENOUS
  Filled 2015-06-28: qty 14

## 2015-06-28 MED ORDER — SODIUM CHLORIDE 0.9 % IJ SOLN
10.0000 mL | INTRAMUSCULAR | Status: DC | PRN
  Administered 2015-06-28: 10 mL
  Filled 2015-06-28: qty 10

## 2015-06-28 MED ORDER — SODIUM CHLORIDE 0.9 % IV SOLN
8.0000 mg | Freq: Once | INTRAVENOUS | Status: AC
  Administered 2015-06-28: 8 mg via INTRAVENOUS
  Filled 2015-06-28: qty 4

## 2015-06-28 MED ORDER — PROCHLORPERAZINE MALEATE 10 MG PO TABS
ORAL_TABLET | ORAL | Status: AC
  Filled 2015-06-28: qty 1

## 2015-06-28 MED ORDER — PROCHLORPERAZINE MALEATE 10 MG PO TABS
10.0000 mg | ORAL_TABLET | Freq: Once | ORAL | Status: AC
  Administered 2015-06-28: 10 mg via ORAL

## 2015-06-28 MED ORDER — LEUCOVORIN CALCIUM INJECTION 100 MG
20.0000 mg/m2 | Freq: Once | INTRAMUSCULAR | Status: AC
  Administered 2015-06-28: 42 mg via INTRAVENOUS
  Filled 2015-06-28: qty 2.1

## 2015-06-28 NOTE — Progress Notes (Signed)
Denies any complaints or changes from chemo yesterday.  Tolerated chemo well. Ambulatory on discharge home with wife.

## 2015-06-28 NOTE — Patient Instructions (Signed)
[facility] Discharge Instructions for Patients Receiving Chemotherapy  Today you received the following chemotherapy agents Leucovorin and 5FU Day 4 of 5.  To help prevent nausea and vomiting after your treatment, we encourage you to take your nausea medication as instructed. If you develop nausea and vomiting that is not controlled by your nausea medication, call the clinic. If it is after clinic hours your family physician or the after hours number for the clinic or go to the Emergency Department. BELOW ARE SYMPTOMS THAT SHOULD BE REPORTED IMMEDIATELY:  *FEVER GREATER THAN 101.0 F  *CHILLS WITH OR WITHOUT FEVER  NAUSEA AND VOMITING THAT IS NOT CONTROLLED WITH YOUR NAUSEA MEDICATION  *UNUSUAL SHORTNESS OF BREATH  *UNUSUAL BRUISING OR BLEEDING  TENDERNESS IN MOUTH AND THROAT WITH OR WITHOUT PRESENCE OF ULCERS  *URINARY PROBLEMS  *BOWEL PROBLEMS  UNUSUAL RASH Items with * indicate a potential emergency and should be followed up as soon as possible.  Return as scheduled.  I have been informed and understand all the instructions given to me. I know to contact the clinic, my physician, or go to the Emergency Department if any problems should occur. I do not have any questions at this time, but understand that I may call the clinic during office hours or the Patient Navigator at [phone] should I have any questions or need assistance in obtaining follow up care.    __________________________________________  _____________  __________ Signature of Patient or Authorized Representative            Date                   Time    __________________________________________ Nurse's Signature

## 2015-07-01 ENCOUNTER — Encounter (HOSPITAL_BASED_OUTPATIENT_CLINIC_OR_DEPARTMENT_OTHER): Payer: Medicaid Other

## 2015-07-01 VITALS — BP 117/61 | HR 57 | Temp 97.8°F | Resp 20 | Wt 211.8 lb

## 2015-07-01 DIAGNOSIS — C189 Malignant neoplasm of colon, unspecified: Secondary | ICD-10-CM | POA: Diagnosis not present

## 2015-07-01 DIAGNOSIS — Z5111 Encounter for antineoplastic chemotherapy: Secondary | ICD-10-CM | POA: Diagnosis present

## 2015-07-01 DIAGNOSIS — C187 Malignant neoplasm of sigmoid colon: Secondary | ICD-10-CM | POA: Diagnosis not present

## 2015-07-01 DIAGNOSIS — C787 Secondary malignant neoplasm of liver and intrahepatic bile duct: Secondary | ICD-10-CM

## 2015-07-01 LAB — COMPREHENSIVE METABOLIC PANEL
ALK PHOS: 71 U/L (ref 38–126)
ALT: 18 U/L (ref 17–63)
ANION GAP: 7 (ref 5–15)
AST: 20 U/L (ref 15–41)
Albumin: 3.9 g/dL (ref 3.5–5.0)
BILIRUBIN TOTAL: 0.6 mg/dL (ref 0.3–1.2)
BUN: 17 mg/dL (ref 6–20)
CALCIUM: 9.1 mg/dL (ref 8.9–10.3)
CO2: 26 mmol/L (ref 22–32)
Chloride: 105 mmol/L (ref 101–111)
Creatinine, Ser: 0.93 mg/dL (ref 0.61–1.24)
GFR calc Af Amer: >60 mL/min (ref >60)
GLUCOSE: 85 mg/dL (ref 65–99)
POTASSIUM: 4.1 mmol/L (ref 3.5–5.1)
Sodium: 138 mmol/L (ref 135–145)
TOTAL PROTEIN: 7 g/dL (ref 6.5–8.1)

## 2015-07-01 MED ORDER — SODIUM CHLORIDE 0.9 % IV SOLN
Freq: Once | INTRAVENOUS | Status: AC
  Administered 2015-07-01: 10:00:00 via INTRAVENOUS

## 2015-07-01 MED ORDER — SODIUM CHLORIDE 0.9 % IJ SOLN
10.0000 mL | INTRAMUSCULAR | Status: DC | PRN
  Administered 2015-07-01: 10 mL
  Filled 2015-07-01: qty 10

## 2015-07-01 MED ORDER — PROCHLORPERAZINE MALEATE 10 MG PO TABS
10.0000 mg | ORAL_TABLET | Freq: Once | ORAL | Status: AC
Start: 2015-07-01 — End: 2015-07-01
  Administered 2015-07-01: 10 mg via ORAL

## 2015-07-01 MED ORDER — SODIUM CHLORIDE 0.9 % IV SOLN
340.0000 mg/m2 | Freq: Once | INTRAVENOUS | Status: AC
  Administered 2015-07-01: 700 mg via INTRAVENOUS
  Filled 2015-07-01: qty 14

## 2015-07-01 MED ORDER — SODIUM CHLORIDE 0.9 % IV SOLN
8.0000 mg | Freq: Once | INTRAVENOUS | Status: AC
  Administered 2015-07-01: 8 mg via INTRAVENOUS
  Filled 2015-07-01: qty 4

## 2015-07-01 MED ORDER — PROCHLORPERAZINE MALEATE 10 MG PO TABS
ORAL_TABLET | ORAL | Status: AC
  Filled 2015-07-01: qty 1

## 2015-07-01 MED ORDER — LEUCOVORIN CALCIUM INJECTION 100 MG
20.0000 mg/m2 | Freq: Once | INTRAMUSCULAR | Status: AC
  Administered 2015-07-01: 42 mg via INTRAVENOUS
  Filled 2015-07-01: qty 2.1

## 2015-07-01 MED ORDER — HEPARIN SOD (PORK) LOCK FLUSH 100 UNIT/ML IV SOLN
500.0000 [IU] | Freq: Once | INTRAVENOUS | Status: AC | PRN
  Administered 2015-07-01: 500 [IU]

## 2015-07-01 MED ORDER — HEPARIN SOD (PORK) LOCK FLUSH 100 UNIT/ML IV SOLN
INTRAVENOUS | Status: AC
  Filled 2015-07-01: qty 5

## 2015-07-01 NOTE — Patient Instructions (Signed)
[facility] Discharge Instructions for Patients Receiving Chemotherapy  Today you received the following chemotherapy agents leucovorin and 5FU.  To help prevent nausea and vomiting after your treatment, we encourage you to take your nausea medication as instructed. If you develop nausea and vomiting that is not controlled by your nausea medication, call the clinic. If it is after clinic hours your family physician or the after hours number for the clinic or go to the Emergency Department. BELOW ARE SYMPTOMS THAT SHOULD BE REPORTED IMMEDIATELY:  *FEVER GREATER THAN 101.0 F  *CHILLS WITH OR WITHOUT FEVER  NAUSEA AND VOMITING THAT IS NOT CONTROLLED WITH YOUR NAUSEA MEDICATION  *UNUSUAL SHORTNESS OF BREATH  *UNUSUAL BRUISING OR BLEEDING  TENDERNESS IN MOUTH AND THROAT WITH OR WITHOUT PRESENCE OF ULCERS  *URINARY PROBLEMS  *BOWEL PROBLEMS  UNUSUAL RASH Items with * indicate a potential emergency and should be followed up as soon as possible.  Return as scheduled.  I have been informed and understand all the instructions given to me. I know to contact the clinic, my physician, or go to the Emergency Department if any problems should occur. I do not have any questions at this time, but understand that I may call the clinic during office hours or the Patient Navigator at [phone] should I have any questions or need assistance in obtaining follow up care.    __________________________________________  _____________  __________ Signature of Patient or Authorized Representative            Date                   Time    __________________________________________ Nurse's Signature

## 2015-07-01 NOTE — Progress Notes (Signed)
Patient here for Day 5 of 5 chemo. Per Dr.Penland he does not need additional lab work prior to chemo today. However we will repeat cmet today for CT scan scheduled this week. 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 Reports that when he touched the steering wheel this morning and the "tingling" just shot through his fingers/hands. Discussed with patient he is no longer taking oxaliplatin which causes the cold sensitivity and then patient said he guesses it is just numbness and tingling otherwise. Reported complaints to Dr.Penland. Continue with chemo today, no Floriene Jeschke orders or instructions.  Tolerated chemo well. Ambulatory on discharge home to self.

## 2015-07-03 ENCOUNTER — Ambulatory Visit (HOSPITAL_COMMUNITY)
Admission: RE | Admit: 2015-07-03 | Discharge: 2015-07-03 | Disposition: A | Discharge status: Home / Self Care | Payer: Medicaid Other | Source: Ambulatory Visit | Attending: Hematology & Oncology | Admitting: Hematology & Oncology

## 2015-07-03 DIAGNOSIS — C189 Malignant neoplasm of colon, unspecified: Secondary | ICD-10-CM | POA: Diagnosis not present

## 2015-07-03 DIAGNOSIS — C787 Secondary malignant neoplasm of liver and intrahepatic bile duct: Secondary | ICD-10-CM | POA: Diagnosis present

## 2015-07-03 DIAGNOSIS — I2510 Atherosclerotic heart disease of native coronary artery without angina pectoris: Secondary | ICD-10-CM | POA: Insufficient documentation

## 2015-07-03 DIAGNOSIS — R918 Other nonspecific abnormal finding of lung field: Secondary | ICD-10-CM | POA: Diagnosis not present

## 2015-07-03 DIAGNOSIS — K5669 Other intestinal obstruction: Secondary | ICD-10-CM | POA: Diagnosis not present

## 2015-07-03 MED ORDER — IOHEXOL 300 MG/ML  SOLN
100.0000 mL | Freq: Once | INTRAMUSCULAR | Status: AC | PRN
  Administered 2015-07-03: 100 mL via INTRAVENOUS

## 2015-07-09 NOTE — Assessment & Plan Note (Addendum)
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  As a result, he was switched to Xeloda 2500 mg BID, 7 days on and 7 days as a maintenance treatment.  In October 2016, this treatment became cost-prohibitive and therefore, on 04/15/2015, he was transitioned to treatment with 5FU/Leucovorin in a days 1-5 every 28 day fashion.  Genetic Counseling is complete and is negative.  Oncology history is updated.  CT scans demonstrate a response to therapy.  He is pleased with this information.  His CT scan demonstrates increased stool burden.  I have recommended MiraLax daily to help with this.  He admits to decreased stool output, but notes a good BM this AM.  Return as scheduled for follow-up and next cycle of treatment as planned beginning on 07/22/2015.

## 2015-07-09 NOTE — Progress Notes (Signed)
[name], MD [address] 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  INTERVAL HISTORY: VIVEK GREALISH 50 y.o. 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 Appearance is most likely to represent diffuse hepatic metastasis   07/30/2014 Initial Diagnosis Colon, biopsy, sigmoid - ADENOCARCINOMA.   07/31/2014 Tumor Marker CEA- 48.0   07/31/2014 Imaging Ct abd/pelvis- concerning for primary colorectal neoplasm in the mid to distal sigmoid colon, with lymphadenopathy in the sigmoid mesocolon, ileocolic mesentery, and retroperitoneum, as well as widespread metastatic disease to the liver   08/01/2014 Imaging CT chest- Pathologic thoracic, right hilar, and infrahilar adenopathy associated with a masslike region of possible consolidation in the right middle lobe, surrounding nodularity, and some scattered bilateral pulmonary nodules.   08/06/2014 - 12/10/2014 Chemotherapy FOLFOX.  Avastin added for cycle 2.  S/P 10 cycles.  Change to maintenance therapy secondary to positive response to therapy, increasing PN, and progressive proteinuria.   10/08/2014 Adverse Reaction Avastin induced proteinuria >/= 2 gm Avastin on hold   10/15/2014 Treatment Plan Change Avastin held for proteinuria.   10/22/2014 Imaging Ct CAP- 1. Interval decrease and soft tissue adjacent to the mid sigmoid colon with decreasing sigmoid colon wall thickening. The  leocolic, mesenteric, perirectal, and mesocolonic lymphadenopathy has decreased in the  interval.   10/30/2014 Survivorship Genetic Counseling. Genetic testing was normal, and did not reveal a deleterious mutation in these genes. The test report will be scanned into EPIC and will be located under the Media tab.    11/26/2014 Treatment Plan Change Oxaliplatin reduced x 25% due to PN.   12/24/2014 Treatment Plan Change Change to maintanence therapy.   01/09/2015 - 03/26/2015 Chemotherapy Xeloda 2500 mg BID, 7 days on and 7 days off.   01/30/2015 Imaging CT in ED- Evidence of distal small bowel obstruction with transition point over the ileum in the right upper pelvis immediately adjacent/abutting the known malignant stricturing of the sigmoid colon. There is wall thickening of the sigmoid colon wi...   01/30/2015 - 01/31/2015 Hospital Admission SBO (small bowel obstruction)   03/26/2015 Treatment Plan Change Xeloda cost-prohibitive without financial help   04/15/2015 -  Chemotherapy 5FU/Leucovorin days 1-5 every 28 days   05/13/2015 Adverse Reaction Palmar-Plantar erythrodysesthesia   05/13/2015 Treatment Plan Change 5FU/Leucovorin held   05/28/2015 Treatment Plan Change 5FU dose reduced by 20%   07/03/2015 Imaging CT CAP- Resolution right-sided pulmonary nodules and decrease in size of small mediastinal nodes. Response to therapy of hepatic metastasis. Similar to slight increase in suspicious nodes in the high mesorectum.    I personally reviewed and went over radiographic studies with the patient.  The results are noted within this dictation.  CT scan demonstrates continued improvement in disease burden.  He is noted to have increased stool burden.  I have recommended MiraLax to help with this.    He denies any complaints today.  Past Medical History  Diagnosis Date  . 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     has No Known Allergies.  Current Outpatient Prescriptions on File Prior to Visit  Medication Sig Dispense Refill  . ascorbic acid (VITAMIN C) 1000 MG tablet Take 500 mg by mouth daily.     [name] dextrose 5 % SOLN 1,000 mL with fluorouracil 5 GM/100ML SOLN Inject into the vein. To be given on Days 1-5 every 28 days    . docusate sodium (COLACE) 100 MG capsule Take 1 capsule (100 mg total) by mouth 2 (two) times daily. 60 capsule 0  . Ferrous Sulfate 27 MG TABS Take 1 tablet by mouth 2 (two) times daily.    [name] LEUCOVORIN CALCIUM IJ Inject as directed. To be given on Days 1-5 every 28 days    . Multiple Vitamin (MULTIVITAMIN WITH MINERALS) TABS tablet Take 1 tablet by mouth daily.    . ondansetron (ZOFRAN) 8 MG tablet Take 8 mg by mouth every 8 (eight) hours as needed for nausea or vomiting.    [name] oxyCODONE-acetaminophen (PERCOCET/ROXICET) 5-325 MG per tablet Take 1-2 tablets by mouth every 4 (four) hours as needed for moderate pain. 30 tablet 0  . polyethylene glycol (MIRALAX / GLYCOLAX) packet Take 17 g by mouth daily as needed for mild constipation. 14 each 0  . prochlorperazine (COMPAZINE) 10 MG tablet Take 10 mg by mouth every 6 (six) hours as needed for nausea or vomiting.    . vitamin E 400 UNIT capsule Take 400 Units by mouth daily.     Current Facility-Administered Medications on File Prior to Visit  Medication Dose Route Frequency Provider Last Rate Last Dose  . sodium chloride 0.9 % injection 10 mL  10 mL Intravenous PRN Patrici Ranks, MD   10 mL at 08/08/14 1237    Past Surgical History  Procedure Laterality Date  . Esophagogastroduodenoscopy N/A 07/30/2014    Procedure: ESOPHAGOGASTRODUODENOSCOPY (EGD);  Surgeon: Inda Castle, MD;  Location: [facility];  Service: Endoscopy;  Laterality: N/A;  . Colonoscopy N/A 07/30/2014    Procedure: COLONOSCOPY;  Surgeon:  Inda Castle, MD;  Location: [facility] ENDOSCOPY;  Service: Endoscopy;  Laterality: N/A;  . Portacath placement Right 08/01/14  . Portacath placement N/A 08/01/2014    Procedure: INSERTION RIGHT IJ PORT-A-CATH WITH ULTRASOUND AND FLUORO;  Surgeon: Gayland Curry, MD;  Location: [facility] ORS;  Service: General;  Laterality: N/A;  . Operative ultrasound N/A 08/01/2014    Procedure: OPERATIVE ULTRASOUND;  Surgeon: Gayland Curry, MD;  Location: [facility] ORS;  Service: General;  Laterality: N/A;    Denies any headaches, dizziness, double vision, fevers, chills, night sweats, nausea, vomiting, diarrhea, constipation, chest pain, heart palpitations, shortness of breath, blood in stool, black tarry stool, urinary pain, urinary burning, urinary frequency, hematuria.   PHYSICAL EXAMINATION  ECOG PERFORMANCE STATUS: 0 - Asymptomatic  Filed Vitals:   07/10/15 1027  BP: 105/31  Pulse: 58  Temp: 98.1 F (36.7 C)  Resp: 18    GENERAL:alert, no distress, well nourished, well developed, comfortable, cooperative, smiling and accompanied by his wife today SKIN: skin color, texture, turgor are normal, no rashes or significant lesions HEAD: Normocephalic, No masses, lesions, tenderness or abnormalities EYES: normal, EOMI, Conjunctiva are pink and non-injected EARS: External ears normal OROPHARYNX:lips, buccal mucosa, and tongue normal and mucous membranes are moist  NECK: supple, no adenopathy, trachea midline LYMPH:  no palpable lymphadenopathy BREAST:not examined LUNGS: clear to auscultation without any wheezes, rales, or rhonchi. HEART: regular rate & rhythm, without murmur, rub, or gallop.  Normal S1 and S2. ABDOMEN:abdomen soft and normal bowel sounds BACK: Back symmetric, no curvature. EXTREMITIES:less then 2 second capillary refill, no skin discoloration, no cyanosis NEURO: alert & oriented x 3 with fluent speech, no focal motor/sensory deficits, gait normal    LABORATORY DATA: CBC    Component Value  Date/Time   WBC 6.6 06/25/2015 0958   RBC 4.44 06/25/2015 0958   RBC 3.42* 07/28/2014 1440   HGB 13.7 06/25/2015 0958   HCT 39.1 06/25/2015 0958   PLT 206 06/25/2015 0958   MCV 88.1 06/25/2015 0958   MCH 30.9 06/25/2015 0958   MCHC 35.0 06/25/2015 0958   RDW 13.4 06/25/2015 0958   LYMPHSABS 1.9 06/25/2015 0958   MONOABS 0.6 06/25/2015 0958   EOSABS 0.2 06/25/2015 0958   BASOSABS 0.0 06/25/2015 0958      Chemistry      Component Value Date/Time   NA 138 07/01/2015 0950   K 4.1 07/01/2015 0950   CL 105 07/01/2015 0950   CO2 26 07/01/2015 0950   BUN 17 07/01/2015 0950   CREATININE 0.93 07/01/2015 0950      Component Value Date/Time   CALCIUM 9.1 07/01/2015 0950   ALKPHOS 71 07/01/2015 0950   AST 20 07/01/2015 0950   ALT 18 07/01/2015 0950   BILITOT 0.6 07/01/2015 0950     Lab Results  Component Value Date   CEA 1.4 06/25/2015      PENDING LABS:   RADIOGRAPHIC STUDIES:  Ct Chest W Contrast  07/03/2015  CLINICAL DATA:  Adenocarcinoma of the colon with liver metastasis diagnosed 2/16. Status post chemotherapy. EXAM: CT CHEST, ABDOMEN, AND PELVIS WITH CONTRAST TECHNIQUE: Multidetector CT imaging of the chest, abdomen and pelvis was performed following the standard protocol during bolus administration of intravenous contrast. CONTRAST:  119m OMNIPAQUE IOHEXOL 300 MG/ML  SOLN COMPARISON:  01/30/2015 abdominal pelvic CT.  10/22/2014 chest CT. FINDINGS: CT CHEST FINDINGS Mediastinum/Nodes: No supraclavicular adenopathy. A right Port-A-Cath which terminates at the high right atrium. Bovine arch. Aortic and branch vessel atherosclerosis. Normal heart size, without pericardial effusion. LAD coronary artery atherosclerosis. No central pulmonary embolism, on this non-dedicated study. Low right paratracheal node measures 5 mm on image 23/series 2 versus 6 mm on the prior. Subcarinal node measures 5 mm on image 29/series 2 versus 9 mm on the prior. No hilar adenopathy. Lungs/Pleura: No  pleural fluid. Previously described right sided pulmonary nodules are not identified. A 2 mm left upper lobe pulmonary nodule on image 23/series 6 is nonspecific and not readily apparent on the prior. No lobar consolidation. Musculoskeletal: No acute osseous abnormality. CT ABDOMEN PELVIS FINDINGS Hepatobiliary: Hepatic metastasis. Index segment 8 lesion measures 2.9 x 2.2 cm on image 51/ series 2. 4.0 x 4.8 cm on 01/30/2015. Index segment 4B lesion measures 2.1 x 2.2 cm on image 61/series 2. Compare 3.2 x 3.8 cm on the prior. Normal gallbladder, without biliary ductal dilatation. Pancreas: Normal, without mass or ductal dilatation. Spleen: Normal in size, without focal abnormality. Adrenals/Urinary Tract: Normal adrenal glands. Left renal cysts and too small to characterize lesions. Normal right kidney, without hydronephrosis. Normal urinary bladder. Stomach/Bowel: Normal stomach, without wall thickening. Apparent stricturing at the mid sigmoid, including on image 97/ series 2. Similar. No well-defined mass in this area. Large stool burden just proximal of this, without high-grade obstruction. Normal terminal ileum and  appendix. The mid to distal ileum is intimately associated with this area on image 100/series 2. Likely adherent. This is the site of bowel obstruction on 01/30/2015. No evidence of residual or recurrent obstruction. Remainder of small bowel unremarkable. Vascular/Lymphatic: No retroperitoneal or retrocrural adenopathy. No pelvic sidewall adenopathy. Nodes in the left side of the high mesorectum measure up to 8 mm on image 99/series 2. 7 mm on the prior. Reproductive: Normal prostate. Other: No significant free fluid. Tiny fat containing bilateral inguinal hernias. No evidence of omental or peritoneal disease. Musculoskeletal: Spondylosis and degenerative disc disease throughout the lumbosacral spine. IMPRESSION: CT CHEST IMPRESSION 1. Resolution right-sided pulmonary nodules and decrease in size of  small mediastinal nodes. 2. A left upper lobe pulmonary nodule is indeterminate and not readily apparent on the prior exam. This may represent a subpleural lymph node. Recommend attention on follow-up. Age advanced coronary artery atherosclerosis. Recommend assessment of coronary risk factors and consideration of medical therapy. CT ABDOMEN AND PELVIS IMPRESSION 1. Response to therapy of hepatic metastasis. 2. Stricture at the mid sigmoid colon, the site of prior malignancy. No well-defined residual soft tissue mass is seen. Large stool burden proximal to this site suggests constipation and perhaps a component of subclinical obstruction. No high-grade obstruction seen. 3. Small bowel again contacting this point of the sigmoid, likely adherent. Resolution of small bowel obstruction. 4. Similar to slight increase in suspicious nodes in the high mesorectum. Electronically Signed   By: Abigail Miyamoto M.D.   On: 07/03/2015 13:41   Ct Abdomen Pelvis W Contrast  07/03/2015  CLINICAL DATA:  Adenocarcinoma of the colon with liver metastasis diagnosed 2/16. Status post chemotherapy. EXAM: CT CHEST, ABDOMEN, AND PELVIS WITH CONTRAST TECHNIQUE: Multidetector CT imaging of the chest, abdomen and pelvis was performed following the standard protocol during bolus administration of intravenous contrast. CONTRAST:  118m OMNIPAQUE IOHEXOL 300 MG/ML  SOLN COMPARISON:  01/30/2015 abdominal pelvic CT.  10/22/2014 chest CT. FINDINGS: CT CHEST FINDINGS Mediastinum/Nodes: No supraclavicular adenopathy. A right Port-A-Cath which terminates at the high right atrium. Bovine arch. Aortic and branch vessel atherosclerosis. Normal heart size, without pericardial effusion. LAD coronary artery atherosclerosis. No central pulmonary embolism, on this non-dedicated study. Low right paratracheal node measures 5 mm on image 23/series 2 versus 6 mm on the prior. Subcarinal node measures 5 mm on image 29/series 2 versus 9 mm on the prior. No hilar  adenopathy. Lungs/Pleura: No pleural fluid. Previously described right sided pulmonary nodules are not identified. A 2 mm left upper lobe pulmonary nodule on image 23/series 6 is nonspecific and not readily apparent on the prior. No lobar consolidation. Musculoskeletal: No acute osseous abnormality. CT ABDOMEN PELVIS FINDINGS Hepatobiliary: Hepatic metastasis. Index segment 8 lesion measures 2.9 x 2.2 cm on image 51/ series 2. 4.0 x 4.8 cm on 01/30/2015. Index segment 4B lesion measures 2.1 x 2.2 cm on image 61/series 2. Compare 3.2 x 3.8 cm on the prior. Normal gallbladder, without biliary ductal dilatation. Pancreas: Normal, without mass or ductal dilatation. Spleen: Normal in size, without focal abnormality. Adrenals/Urinary Tract: Normal adrenal glands. Left renal cysts and too small to characterize lesions. Normal right kidney, without hydronephrosis. Normal urinary bladder. Stomach/Bowel: Normal stomach, without wall thickening. Apparent stricturing at the mid sigmoid, including on image 97/ series 2. Similar. No well-defined mass in this area. Large stool burden just proximal of this, without high-grade obstruction. Normal terminal ileum and appendix. The mid to distal ileum is intimately associated with this area on image 100/series 2.  Likely adherent. This is the site of bowel obstruction on 01/30/2015. No evidence of residual or recurrent obstruction. Remainder of small bowel unremarkable. Vascular/Lymphatic: No retroperitoneal or retrocrural adenopathy. No pelvic sidewall adenopathy. Nodes in the left side of the high mesorectum measure up to 8 mm on image 99/series 2. 7 mm on the prior. Reproductive: Normal prostate. Other: No significant free fluid. Tiny fat containing bilateral inguinal hernias. No evidence of omental or peritoneal disease. Musculoskeletal: Spondylosis and degenerative disc disease throughout the lumbosacral spine. IMPRESSION: CT CHEST IMPRESSION 1. Resolution right-sided pulmonary  nodules and decrease in size of small mediastinal nodes. 2. A left upper lobe pulmonary nodule is indeterminate and not readily apparent on the prior exam. This may represent a subpleural lymph node. Recommend attention on follow-up. Age advanced coronary artery atherosclerosis. Recommend assessment of coronary risk factors and consideration of medical therapy. CT ABDOMEN AND PELVIS IMPRESSION 1. Response to therapy of hepatic metastasis. 2. Stricture at the mid sigmoid colon, the site of prior malignancy. No well-defined residual soft tissue mass is seen. Large stool burden proximal to this site suggests constipation and perhaps a component of subclinical obstruction. No high-grade obstruction seen. 3. Small bowel again contacting this point of the sigmoid, likely adherent. Resolution of small bowel obstruction. 4. Similar to slight increase in suspicious nodes in the high mesorectum. Electronically Signed   By: Abigail Miyamoto M.D.   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  As a result, he was switched to Xeloda 2500 mg BID, 7 days on and 7 days as a maintenance treatment.  In October 2016, this treatment became cost-prohibitive and therefore, on 04/15/2015, he was transitioned to treatment with 5FU/Leucovorin in a days 1-5 every 28 day fashion.  Genetic Counseling is complete and is negative.  Oncology history is updated.  CT scans demonstrate a response to therapy.  He is pleased with this information.  His CT scan demonstrates increased stool burden.  I have recommended MiraLax daily to help with this.  He admits to decreased stool output, but notes a good BM this AM.  Return as scheduled for follow-up  and next cycle of treatment as planned beginning on 07/22/2015.    THERAPY PLAN:  Continue treatment as outlined above.  5FU dose has been reduced by 20%.  All questions were answered. The patient knows to call the clinic with any problems, questions or concerns. We can certainly see the patient much sooner if necessary.  Patient and plan discussed with Dr. Ancil Linsey and she is in agreement with the aforementioned.   This note is electronically signed by: Robynn Pane, PA-C 07/10/2015 11:11 AM

## 2015-07-10 ENCOUNTER — Encounter (HOSPITAL_COMMUNITY): Payer: Medicaid Other | Attending: Hematology & Oncology | Admitting: Oncology

## 2015-07-10 ENCOUNTER — Encounter (HOSPITAL_COMMUNITY): Payer: Self-pay | Admitting: Oncology

## 2015-07-10 VITALS — BP 105/31 | HR 58 | Temp 98.1°F | Resp 18 | Wt 211.0 lb

## 2015-07-10 DIAGNOSIS — C189 Malignant neoplasm of colon, unspecified: Secondary | ICD-10-CM | POA: Diagnosis present

## 2015-07-10 DIAGNOSIS — C7801 Secondary malignant neoplasm of right lung: Secondary | ICD-10-CM

## 2015-07-10 DIAGNOSIS — C7802 Secondary malignant neoplasm of left lung: Secondary | ICD-10-CM

## 2015-07-10 DIAGNOSIS — C787 Secondary malignant neoplasm of liver and intrahepatic bile duct: Secondary | ICD-10-CM

## 2015-07-10 DIAGNOSIS — C187 Malignant neoplasm of sigmoid colon: Secondary | ICD-10-CM | POA: Insufficient documentation

## 2015-07-10 NOTE — Patient Instructions (Signed)
[name] at [facility]  Discharge Instructions:  CT scans demonstrate a response to therapy.  In other words, they are improved with continued treatment. Return as scheduled on 07/22/2015 for follow-up appointment with Dr. Whitney Muse and chemotherapy treatment. Try some MiraLax for your constipation that was noted on your CT scans.   Call with any questions, problems, or issues. Have a wonderful day! _______________________________________________________________  Thank you for choosing [place] at [facility] to provide your oncology and hematology care.  To afford each patient quality time with our providers, please arrive at least 15 minutes before your scheduled appointment.  You need to re-schedule your appointment if you arrive 10 or more minutes late.  We strive to give you quality time with our providers, and arriving late affects you and other patients whose appointments are after yours.  Also, if you no show three or more times for appointments you may be dismissed from the clinic.  Again, thank you for choosing [place] at [place] hope is that these requests will allow you access to exceptional care and in a timely manner. _______________________________________________________________  If you have questions after your visit, please contact our office at [phone] between the hours of 8:30 a.m. and 5:00 p.m. Voicemails left after 4:30 p.m. will not be returned until the following business day. _______________________________________________________________  For prescription refill requests, have your pharmacy contact our office. _______________________________________________________________  Recommendations made by the consultant and any test results will be sent to your referring physician. _______________________________________________________________

## 2015-07-22 ENCOUNTER — Encounter (HOSPITAL_COMMUNITY): Payer: Self-pay | Admitting: Hematology & Oncology

## 2015-07-22 ENCOUNTER — Encounter (HOSPITAL_BASED_OUTPATIENT_CLINIC_OR_DEPARTMENT_OTHER): Payer: Medicaid Other

## 2015-07-22 ENCOUNTER — Encounter (HOSPITAL_BASED_OUTPATIENT_CLINIC_OR_DEPARTMENT_OTHER): Payer: Medicaid Other | Admitting: Hematology & Oncology

## 2015-07-22 VITALS — BP 111/68 | HR 65 | Temp 97.8°F | Resp 18 | Wt 218.0 lb

## 2015-07-22 VITALS — BP 96/69 | HR 56 | Temp 98.0°F | Resp 18

## 2015-07-22 DIAGNOSIS — C787 Secondary malignant neoplasm of liver and intrahepatic bile duct: Secondary | ICD-10-CM

## 2015-07-22 DIAGNOSIS — T451X5A Adverse effect of antineoplastic and immunosuppressive drugs, initial encounter: Secondary | ICD-10-CM

## 2015-07-22 DIAGNOSIS — R808 Other proteinuria: Secondary | ICD-10-CM | POA: Diagnosis not present

## 2015-07-22 DIAGNOSIS — C7802 Secondary malignant neoplasm of left lung: Secondary | ICD-10-CM | POA: Diagnosis not present

## 2015-07-22 DIAGNOSIS — G629 Polyneuropathy, unspecified: Secondary | ICD-10-CM | POA: Diagnosis not present

## 2015-07-22 DIAGNOSIS — G620 Drug-induced polyneuropathy: Secondary | ICD-10-CM | POA: Insufficient documentation

## 2015-07-22 DIAGNOSIS — Z5111 Encounter for antineoplastic chemotherapy: Secondary | ICD-10-CM | POA: Diagnosis not present

## 2015-07-22 DIAGNOSIS — C7801 Secondary malignant neoplasm of right lung: Secondary | ICD-10-CM

## 2015-07-22 DIAGNOSIS — C187 Malignant neoplasm of sigmoid colon: Secondary | ICD-10-CM | POA: Diagnosis not present

## 2015-07-22 DIAGNOSIS — C189 Malignant neoplasm of colon, unspecified: Secondary | ICD-10-CM | POA: Insufficient documentation

## 2015-07-22 DIAGNOSIS — D500 Iron deficiency anemia secondary to blood loss (chronic): Secondary | ICD-10-CM | POA: Insufficient documentation

## 2015-07-22 LAB — COMPREHENSIVE METABOLIC PANEL
ALBUMIN: 3.7 g/dL (ref 3.5–5.0)
ALT: 23 U/L (ref 17–63)
AST: 30 U/L (ref 15–41)
Alkaline Phosphatase: 76 U/L (ref 38–126)
Anion gap: 10 (ref 5–15)
BUN: 15 mg/dL (ref 6–20)
CALCIUM: 8.7 mg/dL — AB (ref 8.9–10.3)
CHLORIDE: 104 mmol/L (ref 101–111)
CO2: 26 mmol/L (ref 22–32)
CREATININE: 0.98 mg/dL (ref 0.61–1.24)
GFR calc Af Amer: >60 mL/min (ref >60)
GFR calc non Af Amer: >60 mL/min (ref >60)
GLUCOSE: 120 mg/dL — AB (ref 65–99)
Potassium: 4 mmol/L (ref 3.5–5.1)
SODIUM: 140 mmol/L (ref 135–145)
Total Bilirubin: 0.5 mg/dL (ref 0.3–1.2)
Total Protein: 6.5 g/dL (ref 6.5–8.1)

## 2015-07-22 LAB — CBC WITH DIFFERENTIAL/PLATELET
BASOS PCT: 1 %
Basophils Absolute: 0 10*3/uL (ref 0.0–0.1)
Eosinophils Absolute: 0.3 10*3/uL (ref 0.0–0.7)
Eosinophils Relative: 4 %
HCT: 38.3 % — ABNORMAL LOW (ref 39.0–52.0)
HEMOGLOBIN: 13.3 g/dL (ref 13.0–17.0)
LYMPHS ABS: 1.8 10*3/uL (ref 0.7–4.0)
LYMPHS PCT: 28 %
MCH: 30.4 pg (ref 26.0–34.0)
MCHC: 34.7 g/dL (ref 30.0–36.0)
MCV: 87.4 fL (ref 78.0–100.0)
MONO ABS: 0.5 10*3/uL (ref 0.1–1.0)
MONOS PCT: 8 %
NEUTROS ABS: 3.9 10*3/uL (ref 1.7–7.7)
NEUTROS PCT: 59 %
PLATELETS: 193 10*3/uL (ref 150–400)
RBC: 4.38 MIL/uL (ref 4.22–5.81)
RDW: 14 % (ref 11.5–15.5)
WBC: 6.4 10*3/uL (ref 4.0–10.5)

## 2015-07-22 MED ORDER — HEPARIN SOD (PORK) LOCK FLUSH 100 UNIT/ML IV SOLN
500.0000 [IU] | Freq: Once | INTRAVENOUS | Status: AC
  Administered 2015-07-22: 500 [IU] via INTRAVENOUS

## 2015-07-22 MED ORDER — HEPARIN SOD (PORK) LOCK FLUSH 100 UNIT/ML IV SOLN
INTRAVENOUS | Status: AC
  Filled 2015-07-22: qty 5

## 2015-07-22 MED ORDER — SODIUM CHLORIDE 0.9 % IV SOLN
340.0000 mg/m2 | Freq: Once | INTRAVENOUS | Status: AC
  Administered 2015-07-22: 700 mg via INTRAVENOUS
  Filled 2015-07-22: qty 14

## 2015-07-22 MED ORDER — PROCHLORPERAZINE MALEATE 10 MG PO TABS
10.0000 mg | ORAL_TABLET | Freq: Once | ORAL | Status: AC
  Administered 2015-07-22: 10 mg via ORAL

## 2015-07-22 MED ORDER — PROCHLORPERAZINE MALEATE 10 MG PO TABS
ORAL_TABLET | ORAL | Status: AC
  Filled 2015-07-22: qty 1

## 2015-07-22 MED ORDER — SODIUM CHLORIDE 0.9 % IJ SOLN
10.0000 mL | INTRAMUSCULAR | Status: DC | PRN
  Administered 2015-07-22: 10 mL
  Filled 2015-07-22: qty 10

## 2015-07-22 MED ORDER — LEUCOVORIN CALCIUM INJECTION 100 MG
20.0000 mg/m2 | Freq: Once | INTRAMUSCULAR | Status: AC
  Administered 2015-07-22: 42 mg via INTRAVENOUS
  Filled 2015-07-22: qty 2.1

## 2015-07-22 MED ORDER — SODIUM CHLORIDE 0.9 % IV SOLN
Freq: Once | INTRAVENOUS | Status: AC
  Administered 2015-07-22: 10:00:00 via INTRAVENOUS

## 2015-07-22 MED ORDER — ONDANSETRON HCL 40 MG/20ML IJ SOLN
8.0000 mg | Freq: Once | INTRAMUSCULAR | Status: AC
  Administered 2015-07-22: 8 mg via INTRAVENOUS
  Filled 2015-07-22: qty 4

## 2015-07-22 NOTE — Patient Instructions (Addendum)
[place] at [facility] Discharge Instructions  RECOMMENDATIONS MADE BY THE CONSULTANT AND ANY TEST RESULTS WILL BE SENT TO YOUR REFERRING PHYSICIAN.   Exam and discussion by Dr Whitney Muse today Scans looked good Treatment today and all week if labs look good Return to see the doctor in 1 month Please call the clinic if you have any questions or concerns    Thank you for choosing [place] at [facility] to provide your oncology and hematology care.  To afford each patient quality time with our provider, please arrive at least 15 minutes before your scheduled appointment time.   Beginning January 23rd 2017 lab work for the [name] will be done in the  Main lab at [facility] on 1st floor. If you have a lab appointment with the [place] please come in thru the  Main Entrance and check in at the main information desk  You need to re-schedule your appointment should you arrive 10 or more minutes late.  We strive to give you quality time with our providers, and arriving late affects you and other patients whose appointments are after yours.  Also, if you no show three or more times for appointments you may be dismissed from the clinic at the providers discretion.     Again, thank you for choosing [facility]  Our hope is that these requests will decrease the amount of time that you wait before being seen by our physicians.       _____________________________________________________________  Should you have questions after your visit to [facility], please contact our office at [phone] between the hours of 8:30 a.m. and 4:30 p.m.  Voicemails left after 4:30 p.m. will not be returned until the following business day.  For prescription refill requests, have your pharmacy contact our office.

## 2015-07-22 NOTE — Progress Notes (Signed)
Patient tolerated infusion well.  VSS.  Dressing on port access reinforced for subsequent infusion days.

## 2015-07-22 NOTE — Progress Notes (Signed)
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 Last CT scans of the abdomen and pelvis was on 8/31. Last CEA values have been WNL. Weight today is 218 lbs up from the 170's from his initial diagnosis.   Tom [name] is accompanied by his wife. He is here for cycle #4 5-FU/LV and receiving treatment during our visit. I personally reviewed and went over imaging studies with the patient.  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 He has noticed more neuropathy sensitivity with the cold weather. His wife states that he is doing pretty good though he has some days where his mood is not as good and gets frustrated more quickly. The patient reports that he has more good days than bad days. Reports weight gain. He reports back pain over the last few days, similar to previous intermittent back pain. He also notes he was out cleaning his car over the weekend and bending over a lot.  Continues to have rash on LLE which only itches when sitting on the toilet attributing it to his pants rubbing it while sitting down. He continues to moisturize it with lotion.  He did not have issues with redness or burning on his hands or feet after his last treatment. Denies abdominal pain.  He does not need refills at this time.   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 Appearance is most likely to represent diffuse  hepatic metastasis   07/30/2014 Initial Diagnosis Colon, biopsy, sigmoid - ADENOCARCINOMA.   07/31/2014 Tumor Marker CEA- 48.0   07/31/2014 Imaging Ct abd/pelvis- concerning for primary colorectal neoplasm in the mid to distal sigmoid colon, with lymphadenopathy in the sigmoid mesocolon, ileocolic mesentery, and retroperitoneum, as well as widespread metastatic disease to the liver   08/01/2014 Imaging CT chest- Pathologic thoracic, right hilar, and infrahilar adenopathy associated with a masslike region of possible consolidation in the right middle lobe, surrounding nodularity, and some scattered bilateral pulmonary nodules.   08/06/2014 - 12/10/2014 Chemotherapy FOLFOX.  Avastin added for cycle 2.  S/P 10 cycles.  Change to maintenance therapy secondary to positive response to therapy, increasing PN, and progressive proteinuria.   10/08/2014 Adverse Reaction Avastin induced proteinuria >/= 2 gm Avastin on hold   10/15/2014 Treatment Plan Change Avastin held for proteinuria.   10/22/2014 Imaging Ct CAP- 1. Interval decrease and soft tissue adjacent to the mid sigmoid colon with decreasing sigmoid colon wall thickening. The  leocolic, mesenteric, perirectal, and mesocolonic lymphadenopathy has decreased in the interval.   10/30/2014 Survivorship Genetic Counseling. Genetic testing was normal, and did not reveal a deleterious mutation in these genes. The test report will be scanned into EPIC and will be located under the Media tab.    11/26/2014 Treatment Plan Change Oxaliplatin reduced x 25% due to PN.   12/24/2014 Treatment Plan Change Change to maintanence therapy.   01/09/2015 - 03/26/2015 Chemotherapy Xeloda 2500 mg  BID, 7 days on and 7 days off.   01/30/2015 Imaging CT in ED- Evidence of distal small bowel obstruction with transition point over the ileum in the right upper pelvis immediately adjacent/abutting the known malignant stricturing of the sigmoid colon. There is wall thickening of the sigmoid colon wi...    01/30/2015 - 01/31/2015 Hospital Admission SBO (small bowel obstruction)   03/26/2015 Treatment Plan Change Xeloda cost-prohibitive without financial help         I personally reviewed and went over laboratory results with the patient.  The results are noted within this dictation.  Past Medical History  Diagnosis Date  . 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     has No Known Allergies.  We administered Influenza vac split quadrivalent PF, heparin lock flush, and sodium chloride.  Past Surgical History  Procedure Laterality Date  . Esophagogastroduodenoscopy N/A 07/30/2014    Procedure: ESOPHAGOGASTRODUODENOSCOPY (EGD);  Surgeon: Inda Castle, MD;  Location: [facility];  Service: Endoscopy;  Laterality: N/A;  . Colonoscopy N/A 07/30/2014    Procedure: COLONOSCOPY;  Surgeon: Inda Castle, MD;  Location: [facility];  Service: Endoscopy;  Laterality: N/A;  . Portacath placement Right 08/01/14  . Portacath placement N/A 08/01/2014    Procedure: INSERTION RIGHT IJ PORT-A-CATH WITH ULTRASOUND AND FLUORO;  Surgeon: Gayland Curry, MD;  Location: [facility];  Service: General;  Laterality: N/A;  . Operative ultrasound N/A 08/01/2014    Procedure: OPERATIVE ULTRASOUND;  Surgeon: Gayland Curry, MD;  Location: [facility];  Service: General;  Laterality: N/A;    Denies any headaches, dizziness, double vision, fevers, chills, night sweats, nausea, vomiting, diarrhea, chest pain, heart palpitations, shortness of breath, blood in stool, black tarry stool, urinary pain, urinary burning, urinary frequency, hematuria. Positive for weight gain. Positive for back pain. Positive for rash and itching.     LLE with  intermittent itching. Positive for tingling.     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 SKIN: skin color, texture, turgor are normal, no rashes or significant lesions HEAD: Normocephalic, No masses, lesions, tenderness or abnormalities EYES: normal, PERRLA, EOMI, Conjunctiva are pink and non-injected EARS: External ears normal OROPHARYNX:lips, buccal mucosa, and tongue normal and mucous membranes are moist  NECK: supple, trachea midline LYMPH:  No palpable adenopathy in the neck or supraclavicular regions, no axillary adenopathy BREAST:not examined LUNGS: Air to auscultation bilaterally with no wheezing or rhonchi HEART: S1 and S2 audible, regular, no ectopy ABDOMEN:abdomen soft and normal bowel sounds. No palpable liver edge. BACK: Back symmetric, no curvature. EXTREMITIES:less then 2 second capillary refill, no joint deformities, effusion, or inflammation, no cyanosis Mild redness/rash on LLE.  NEURO: alert & oriented x 3 with fluent speech, no focal motor/sensory deficits, gait normal   LABORATORY DATA: I have reviewed the data below as listed. Results for Tom, [name]  Tom [name] (MRN [id number]) as of 07/22/2015 09:21  Ref. Range 06/25/2015 09:58 07/01/2015 09:50  Sodium Latest Ref Range: 135-145 mmol/L 139 138  Potassium Latest Ref Range: 3.5-5.1 mmol/L 4.2 4.1  Chloride Latest Ref Range: 101-111 mmol/L 104 105  CO2 Latest Ref Range: 22-32 mmol/L 27 26  BUN Latest Ref Range: 6-20 mg/dL 16 17  Creatinine Latest Ref Range: 0.61-1.24 mg/dL 0.82 0.93  Calcium Latest  Ref Range: 8.9-10.3 mg/dL 9.2 9.1  EGFR (Non-African Amer.) Latest Ref Range: >60 mL/min >60 >60  EGFR (African American) Latest Ref Range: >60 mL/min >60 >60  Glucose Latest Ref Range: 65-99 mg/dL 90 85  Anion gap Latest Ref Range: 5-15  8 7   Alkaline Phosphatase Latest Ref Range: 38-126 U/L 91 71  Albumin Latest Ref Range: 3.5-5.0 g/dL 3.8 3.9  AST Latest Ref Range: 15-41 U/L 31 20  ALT Latest Ref Range: 17-63 U/L 30 18  Total Protein Latest Ref Range: 6.5-8.1 g/dL 6.7 7.0  Total Bilirubin Latest Ref Range: 0.3-1.2 mg/dL 0.5 0.6  WBC Latest Ref Range: 4.0-10.5 K/uL 6.6   RBC Latest Ref Range: 4.22-5.81 MIL/uL 4.44   Hemoglobin Latest Ref Range: 13.0-17.0 g/dL 13.7   HCT Latest Ref Range: 39.0-52.0 % 39.1   MCV Latest Ref Range: 78.0-100.0 fL 88.1   MCH Latest Ref Range: 26.0-34.0 pg 30.9   MCHC Latest Ref Range: 30.0-36.0 g/dL 35.0   RDW Latest Ref Range: 11.5-15.5 % 13.4   Platelets Latest Ref Range: 150-400 K/uL 206   Neutrophils Latest Units: % 59   Lymphocytes Latest Units: % 29   Monocytes Relative Latest Units: % 8   Eosinophil Latest Units: % 4   Basophil Latest Units: % 0   NEUT# Latest Ref Range: 1.7-7.7 K/uL 3.9   Lymphocyte # Latest Ref Range: 0.7-4.0 K/uL 1.9   Monocyte # Latest Ref Range: 0.1-1.0 K/uL 0.6   Eosinophils Absolute Latest Ref Range: 0.0-0.7 K/uL 0.2   Basophils Absolute Latest Ref Range: 0.0-0.1 K/uL 0.0   CEA Latest Ref Range: 0.0-4.7 ng/mL 1.4    RADIOLOGY: I have personally reviewed the radiological images as listed and agreed with the findings in the report. Study Result     CLINICAL DATA: Adenocarcinoma of the colon with liver metastasis diagnosed 2/16. Status post chemotherapy.  EXAM: CT CHEST, ABDOMEN, AND PELVIS WITH CONTRAST  TECHNIQUE: Multidetector CT imaging of the chest, abdomen and pelvis was performed following the standard protocol during bolus administration of intravenous contrast.  CONTRAST: 119m OMNIPAQUE IOHEXOL  300 MG/ML SOLN  COMPARISON: 01/30/2015 abdominal pelvic CT. 10/22/2014 chest CT.  FINDINGS: CT CHEST FINDINGS  Mediastinum/Nodes: No supraclavicular adenopathy. A right Port-A-Cath which terminates at the high right atrium. Bovine arch. Aortic and branch vessel atherosclerosis. Normal heart size, without pericardial effusion. LAD coronary artery atherosclerosis. No central pulmonary embolism, on this non-dedicated study.  Low right paratracheal node measures 5 mm on image 23/series 2 versus 6 mm on the prior.  Subcarinal node measures 5 mm on image 29/series 2 versus 9 mm on the prior. No hilar adenopathy.  Lungs/Pleura: No pleural fluid. Previously described right sided pulmonary nodules are not identified.  A 2 mm left upper lobe pulmonary nodule on image 23/series 6 is nonspecific and not readily apparent on the prior. No lobar consolidation.  Musculoskeletal: No acute osseous abnormality.  CT ABDOMEN PELVIS FINDINGS  Hepatobiliary: Hepatic metastasis. Index segment 8 lesion measures 2.9 x 2.2 cm on image 51/ series 2. 4.0 x 4.8 cm on 01/30/2015.  Index segment  4B lesion measures 2.1 x 2.2 cm on image 61/series 2. Compare 3.2 x 3.8 cm on the prior.  Normal gallbladder, without biliary ductal dilatation.  Pancreas: Normal, without mass or ductal dilatation.  Spleen: Normal in size, without focal abnormality.  Adrenals/Urinary Tract: Normal adrenal glands. Left renal cysts and too small to characterize lesions. Normal right kidney, without hydronephrosis. Normal urinary bladder.  Stomach/Bowel: Normal stomach, without wall thickening. Apparent stricturing at the mid sigmoid, including on image 97/ series 2. Similar. No well-defined mass in this area. Large stool burden just proximal of this, without high-grade obstruction. Normal terminal ileum and appendix.  The mid to distal ileum is intimately associated with this area on image 100/series  2. Likely adherent. This is the site of bowel obstruction on 01/30/2015. No evidence of residual or recurrent obstruction. Remainder of small bowel unremarkable.  Vascular/Lymphatic: No retroperitoneal or retrocrural adenopathy. No pelvic sidewall adenopathy. Nodes in the left side of the high mesorectum measure up to 8 mm on image 99/series 2. 7 mm on the prior.  Reproductive: Normal prostate.  Other: No significant free fluid. Tiny fat containing bilateral inguinal hernias. No evidence of omental or peritoneal disease.  Musculoskeletal: Spondylosis and degenerative disc disease throughout the lumbosacral spine.  IMPRESSION: CT CHEST IMPRESSION  1. Resolution right-sided pulmonary nodules and decrease in size of small mediastinal nodes. 2. A left upper lobe pulmonary nodule is indeterminate and not readily apparent on the prior exam. This may represent a subpleural lymph node. Recommend attention on follow-up. Age advanced coronary artery atherosclerosis. Recommend assessment of coronary risk factors and consideration of medical therapy.  CT ABDOMEN AND PELVIS IMPRESSION  1. Response to therapy of hepatic metastasis. 2. Stricture at the mid sigmoid colon, the site of prior malignancy. No well-defined residual soft tissue mass is seen. Large stool burden proximal to this site suggests constipation and perhaps a component of subclinical obstruction. No high-grade obstruction seen. 3. Small bowel again contacting this point of the sigmoid, likely adherent. Resolution of small bowel obstruction. 4. Similar to slight increase in suspicious nodes in the high mesorectum.   Electronically Signed  By: Abigail Miyamoto M.D.  On: 07/03/2015 13:41     ASSESSMENT AND PLAN:  Stage IV colon cancer, KRAS WT  50 year old male with stage IV colon cancer he has had a significant improvement in his disease and clinical status.He is currently on single agent 5-FU with excellent  tolerance. His PS is excellent. CEA is WNL.  Unfortunately AVASTIN had to be discontinued secondary to persistent proteinuria.   CT imaging was reviewed with the patient. Ongoing improvement in disease is noted. He denies difficulty passing stool. He was encouraged to keep his stool soft.  Genetics Counseling  He has completed genetics counseling. His genetic test results were normal--i.e. testing found no meaningful changes to any of 19 genes associated with increased risks for colorectal and other types of cancers.  Nephrotic Range Proteinuria  He has been off of Avastin which is most commonly implicated in proteinuria since May 2. He has been seen by nephrology.   THERAPY PLAN:  Continue therapy a planned   He will return in 3 weeks with his next cycle of treatment on 3/21.   All questions were answered. The patient knows to call the clinic with any problems, questions or concerns. We can certainly see the patient much sooner if necessary.   This document serves as a record of services personally performed by Ancil Linsey, MD. It was created on  her behalf by Arlyce Harman, a trained medical scribe. The creation of this record is based on the scribe's personal observations and the provider's statements to them. This document has been checked and approved by the attending provider.  I have reviewed the above documentation for accuracy and completeness, and I agree with the above.  This note was electronically signed.  Kelby Fam. Whitney Muse, MD

## 2015-07-22 NOTE — Patient Instructions (Signed)
[facility] Discharge Instructions for Patients Receiving Chemotherapy   Beginning January 23rd 2017 lab work for the [facility] will be done in the  Main lab at [facility] on 1st floor. If you have a lab appointment with the [facility] please come in thru the  Main Entrance and check in at the main information desk   Today you received the following chemotherapy agents: Leucovorin and fluorouracil.     If you develop nausea and vomiting, or diarrhea that is not controlled by your medication, call the clinic.  The clinic phone number is [phone]. Office hours are Monday-Friday 8:30am-5:00pm.  BELOW ARE SYMPTOMS THAT SHOULD BE REPORTED IMMEDIATELY:  *FEVER GREATER THAN 101.0 F  *CHILLS WITH OR WITHOUT FEVER  NAUSEA AND VOMITING THAT IS NOT CONTROLLED WITH YOUR NAUSEA MEDICATION  *UNUSUAL SHORTNESS OF BREATH  *UNUSUAL BRUISING OR BLEEDING  TENDERNESS IN MOUTH AND THROAT WITH OR WITHOUT PRESENCE OF ULCERS  *URINARY PROBLEMS  *BOWEL PROBLEMS  UNUSUAL RASH Items with * indicate a potential emergency and should be followed up as soon as possible. If you have an emergency after office hours please contact your primary care physician or go to the nearest emergency department.  Please call the clinic during office hours if you have any questions or concerns.   You may also contact the Patient Navigator at [phone] should you have any questions or need assistance in obtaining follow up care.

## 2015-07-23 ENCOUNTER — Encounter (HOSPITAL_COMMUNITY): Payer: Self-pay

## 2015-07-23 ENCOUNTER — Encounter (HOSPITAL_BASED_OUTPATIENT_CLINIC_OR_DEPARTMENT_OTHER): Payer: Medicaid Other

## 2015-07-23 VITALS — BP 105/70 | HR 57 | Temp 97.8°F | Resp 18 | Wt 217.8 lb

## 2015-07-23 DIAGNOSIS — Z5111 Encounter for antineoplastic chemotherapy: Secondary | ICD-10-CM

## 2015-07-23 DIAGNOSIS — C787 Secondary malignant neoplasm of liver and intrahepatic bile duct: Secondary | ICD-10-CM | POA: Diagnosis not present

## 2015-07-23 DIAGNOSIS — C189 Malignant neoplasm of colon, unspecified: Secondary | ICD-10-CM | POA: Diagnosis not present

## 2015-07-23 LAB — CEA: CEA: 1.8 ng/mL (ref 0.0–4.7)

## 2015-07-23 MED ORDER — SODIUM CHLORIDE 0.9 % IV SOLN
Freq: Once | INTRAVENOUS | Status: AC
  Administered 2015-07-23: 11:00:00 via INTRAVENOUS

## 2015-07-23 MED ORDER — PROCHLORPERAZINE MALEATE 10 MG PO TABS
10.0000 mg | ORAL_TABLET | Freq: Once | ORAL | Status: AC
  Administered 2015-07-23: 10 mg via ORAL
  Filled 2015-07-23: qty 1

## 2015-07-23 MED ORDER — ONDANSETRON HCL 4 MG PO TABS
ORAL_TABLET | ORAL | Status: AC
  Filled 2015-07-23: qty 2

## 2015-07-23 MED ORDER — LEUCOVORIN CALCIUM INJECTION 100 MG
20.0000 mg/m2 | Freq: Once | INTRAMUSCULAR | Status: AC
  Administered 2015-07-23: 42 mg via INTRAVENOUS
  Filled 2015-07-23: qty 2.1

## 2015-07-23 MED ORDER — SODIUM CHLORIDE 0.9 % IJ SOLN
10.0000 mL | INTRAMUSCULAR | Status: DC | PRN
  Administered 2015-07-23: 10 mL
  Filled 2015-07-23: qty 10

## 2015-07-23 MED ORDER — HEPARIN SOD (PORK) LOCK FLUSH 100 UNIT/ML IV SOLN
500.0000 [IU] | Freq: Once | INTRAVENOUS | Status: AC | PRN
  Administered 2015-07-23: 500 [IU]

## 2015-07-23 MED ORDER — FLUOROURACIL CHEMO INJECTION 2.5 GM/50ML
340.0000 mg/m2 | Freq: Once | INTRAVENOUS | Status: AC
  Administered 2015-07-23: 700 mg via INTRAVENOUS
  Filled 2015-07-23: qty 14

## 2015-07-23 MED ORDER — SODIUM CHLORIDE 0.9 % IV SOLN
8.0000 mg | Freq: Once | INTRAVENOUS | Status: AC
  Administered 2015-07-23: 8 mg via INTRAVENOUS
  Filled 2015-07-23: qty 4

## 2015-07-23 NOTE — Patient Instructions (Signed)
Day 2 [id number]

## 2015-07-23 NOTE — Progress Notes (Signed)
..  Tom [name] arrived today for day 2 chemo. No changes or complaints.  Tolerated infusion well

## 2015-07-24 ENCOUNTER — Encounter (HOSPITAL_BASED_OUTPATIENT_CLINIC_OR_DEPARTMENT_OTHER): Payer: Medicaid Other

## 2015-07-24 VITALS — BP 111/70 | HR 57 | Temp 98.4°F | Resp 18 | Wt 217.0 lb

## 2015-07-24 DIAGNOSIS — Z5111 Encounter for antineoplastic chemotherapy: Secondary | ICD-10-CM

## 2015-07-24 DIAGNOSIS — C787 Secondary malignant neoplasm of liver and intrahepatic bile duct: Secondary | ICD-10-CM

## 2015-07-24 DIAGNOSIS — C189 Malignant neoplasm of colon, unspecified: Secondary | ICD-10-CM

## 2015-07-24 MED ORDER — ONDANSETRON HCL 40 MG/20ML IJ SOLN
8.0000 mg | Freq: Once | INTRAMUSCULAR | Status: AC
  Administered 2015-07-24: 8 mg via INTRAVENOUS
  Filled 2015-07-24: qty 4

## 2015-07-24 MED ORDER — SODIUM CHLORIDE 0.9 % IJ SOLN
10.0000 mL | INTRAMUSCULAR | Status: DC | PRN
  Administered 2015-07-24: 10 mL
  Filled 2015-07-24: qty 10

## 2015-07-24 MED ORDER — LEUCOVORIN CALCIUM INJECTION 100 MG
20.0000 mg/m2 | Freq: Once | INTRAMUSCULAR | Status: AC
  Administered 2015-07-24: 42 mg via INTRAVENOUS
  Filled 2015-07-24: qty 2.1

## 2015-07-24 MED ORDER — SODIUM CHLORIDE 0.9 % IV SOLN
Freq: Once | INTRAVENOUS | Status: AC
  Administered 2015-07-24: 10:00:00 via INTRAVENOUS

## 2015-07-24 MED ORDER — PROCHLORPERAZINE MALEATE 10 MG PO TABS
10.0000 mg | ORAL_TABLET | Freq: Once | ORAL | Status: AC
  Administered 2015-07-24: 10 mg via ORAL

## 2015-07-24 MED ORDER — PROCHLORPERAZINE MALEATE 10 MG PO TABS
ORAL_TABLET | ORAL | Status: AC
  Filled 2015-07-24: qty 1

## 2015-07-24 MED ORDER — HEPARIN SOD (PORK) LOCK FLUSH 100 UNIT/ML IV SOLN
500.0000 [IU] | Freq: Once | INTRAVENOUS | Status: AC | PRN
  Administered 2015-07-24: 500 [IU]

## 2015-07-24 MED ORDER — HEPARIN SOD (PORK) LOCK FLUSH 100 UNIT/ML IV SOLN
INTRAVENOUS | Status: AC
  Filled 2015-07-24: qty 5

## 2015-07-24 MED ORDER — SODIUM CHLORIDE 0.9 % IV SOLN
340.0000 mg/m2 | Freq: Once | INTRAVENOUS | Status: AC
  Administered 2015-07-24: 700 mg via INTRAVENOUS
  Filled 2015-07-24: qty 14

## 2015-07-25 ENCOUNTER — Encounter (HOSPITAL_BASED_OUTPATIENT_CLINIC_OR_DEPARTMENT_OTHER): Payer: Medicaid Other

## 2015-07-25 VITALS — BP 111/70 | HR 63 | Temp 98.4°F | Resp 18 | Wt 216.0 lb

## 2015-07-25 DIAGNOSIS — Z5111 Encounter for antineoplastic chemotherapy: Secondary | ICD-10-CM

## 2015-07-25 DIAGNOSIS — C787 Secondary malignant neoplasm of liver and intrahepatic bile duct: Secondary | ICD-10-CM | POA: Diagnosis not present

## 2015-07-25 DIAGNOSIS — C189 Malignant neoplasm of colon, unspecified: Secondary | ICD-10-CM | POA: Diagnosis not present

## 2015-07-25 MED ORDER — SODIUM CHLORIDE 0.9 % IV SOLN
Freq: Once | INTRAVENOUS | Status: AC
  Administered 2015-07-25: 10:00:00 via INTRAVENOUS

## 2015-07-25 MED ORDER — SODIUM CHLORIDE 0.9 % IV SOLN
340.0000 mg/m2 | Freq: Once | INTRAVENOUS | Status: AC
  Administered 2015-07-25: 700 mg via INTRAVENOUS
  Filled 2015-07-25: qty 14

## 2015-07-25 MED ORDER — PROCHLORPERAZINE MALEATE 10 MG PO TABS
ORAL_TABLET | ORAL | Status: AC
  Filled 2015-07-25: qty 1

## 2015-07-25 MED ORDER — SODIUM CHLORIDE 0.9 % IJ SOLN
10.0000 mL | INTRAMUSCULAR | Status: DC | PRN
  Administered 2015-07-25: 10 mL
  Filled 2015-07-25: qty 10

## 2015-07-25 MED ORDER — HEPARIN SOD (PORK) LOCK FLUSH 100 UNIT/ML IV SOLN
INTRAVENOUS | Status: AC
  Filled 2015-07-25: qty 5

## 2015-07-25 MED ORDER — LEUCOVORIN CALCIUM INJECTION 100 MG
20.0000 mg/m2 | Freq: Once | INTRAMUSCULAR | Status: AC
  Administered 2015-07-25: 42 mg via INTRAVENOUS
  Filled 2015-07-25: qty 2.1

## 2015-07-25 MED ORDER — ONDANSETRON HCL 40 MG/20ML IJ SOLN
8.0000 mg | Freq: Once | INTRAMUSCULAR | Status: AC
  Administered 2015-07-25: 8 mg via INTRAVENOUS
  Filled 2015-07-25: qty 4

## 2015-07-25 MED ORDER — PROCHLORPERAZINE MALEATE 10 MG PO TABS
10.0000 mg | ORAL_TABLET | Freq: Once | ORAL | Status: AC
  Administered 2015-07-25: 10 mg via ORAL

## 2015-07-25 MED ORDER — HEPARIN SOD (PORK) LOCK FLUSH 100 UNIT/ML IV SOLN
500.0000 [IU] | Freq: Once | INTRAVENOUS | Status: AC | PRN
  Administered 2015-07-25: 500 [IU]

## 2015-07-25 NOTE — Patient Instructions (Signed)
[facility] Discharge Instructions for Patients Receiving Chemotherapy  Today you received the following chemotherapy agents leucovorin and 5FU day 4 of 5.  BELOW ARE SYMPTOMS THAT SHOULD BE REPORTED IMMEDIATELY:  *FEVER GREATER THAN 101.0 F  *CHILLS WITH OR WITHOUT FEVER  NAUSEA AND VOMITING THAT IS NOT CONTROLLED WITH YOUR NAUSEA MEDICATION  *UNUSUAL SHORTNESS OF BREATH  *UNUSUAL BRUISING OR BLEEDING  TENDERNESS IN MOUTH AND THROAT WITH OR WITHOUT PRESENCE OF ULCERS  *URINARY PROBLEMS  *BOWEL PROBLEMS  UNUSUAL RASH Items with * indicate a potential emergency and should be followed up as soon as possible.  Return as scheduled.  I have been informed and understand all the instructions given to me. I know to contact the clinic, my physician, or go to the Emergency Department if any problems should occur. I do not have any questions at this time, but understand that I may call the clinic during office hours or the Patient Navigator at [phone] should I have any questions or need assistance in obtaining follow up care.    __________________________________________  _____________  __________ Signature of Patient or Authorized Representative            Date                   Time    __________________________________________ Nurse's Signature

## 2015-07-25 NOTE — Progress Notes (Signed)
Tolerated chemo well. Ambulatory on discharge home with wife. 

## 2015-07-26 ENCOUNTER — Encounter (HOSPITAL_COMMUNITY): Payer: Self-pay

## 2015-07-26 ENCOUNTER — Encounter (HOSPITAL_BASED_OUTPATIENT_CLINIC_OR_DEPARTMENT_OTHER): Payer: Medicaid Other

## 2015-07-26 VITALS — BP 111/73 | HR 58 | Temp 98.5°F | Resp 16 | Wt 214.4 lb

## 2015-07-26 DIAGNOSIS — C787 Secondary malignant neoplasm of liver and intrahepatic bile duct: Secondary | ICD-10-CM | POA: Diagnosis not present

## 2015-07-26 DIAGNOSIS — Z5111 Encounter for antineoplastic chemotherapy: Secondary | ICD-10-CM

## 2015-07-26 DIAGNOSIS — C189 Malignant neoplasm of colon, unspecified: Secondary | ICD-10-CM

## 2015-07-26 MED ORDER — HEPARIN SOD (PORK) LOCK FLUSH 100 UNIT/ML IV SOLN
500.0000 [IU] | Freq: Once | INTRAVENOUS | Status: AC | PRN
  Administered 2015-07-26: 500 [IU]

## 2015-07-26 MED ORDER — LEUCOVORIN CALCIUM INJECTION 100 MG
20.0000 mg/m2 | Freq: Once | INTRAMUSCULAR | Status: AC
  Administered 2015-07-26: 42 mg via INTRAVENOUS
  Filled 2015-07-26: qty 2.1

## 2015-07-26 MED ORDER — HEPARIN SOD (PORK) LOCK FLUSH 100 UNIT/ML IV SOLN
INTRAVENOUS | Status: AC
  Filled 2015-07-26: qty 5

## 2015-07-26 MED ORDER — SODIUM CHLORIDE 0.9 % IV SOLN
Freq: Once | INTRAVENOUS | Status: AC
  Administered 2015-07-26: 11:00:00 via INTRAVENOUS

## 2015-07-26 MED ORDER — SODIUM CHLORIDE 0.9 % IV SOLN
340.0000 mg/m2 | Freq: Once | INTRAVENOUS | Status: AC
  Administered 2015-07-26: 700 mg via INTRAVENOUS
  Filled 2015-07-26: qty 14

## 2015-07-26 MED ORDER — PROCHLORPERAZINE MALEATE 10 MG PO TABS
10.0000 mg | ORAL_TABLET | Freq: Once | ORAL | Status: AC
  Administered 2015-07-26: 10 mg via ORAL

## 2015-07-26 MED ORDER — SODIUM CHLORIDE 0.9 % IV SOLN
8.0000 mg | Freq: Once | INTRAVENOUS | Status: AC
  Administered 2015-07-26: 8 mg via INTRAVENOUS
  Filled 2015-07-26: qty 4

## 2015-07-26 MED ORDER — SODIUM CHLORIDE 0.9 % IJ SOLN
10.0000 mL | INTRAMUSCULAR | Status: DC | PRN
  Administered 2015-07-26: 10 mL
  Filled 2015-07-26: qty 10

## 2015-07-26 MED ORDER — PROCHLORPERAZINE MALEATE 10 MG PO TABS
ORAL_TABLET | ORAL | Status: AC
  Filled 2015-07-26: qty 1

## 2015-07-26 NOTE — Patient Instructions (Signed)
[facility] Discharge Instructions for Patients Receiving Chemotherapy  Today you received the following chemotherapy agents leucovorin and 5FU Day 5 of 5.  To help prevent nausea and vomiting after your treatment, we encourage you to take your nausea medication as instructed. If you develop nausea and vomiting that is not controlled by your nausea medication, call the clinic. If it is after clinic hours your family physician or the after hours number for the clinic or go to the Emergency Department. BELOW ARE SYMPTOMS THAT SHOULD BE REPORTED IMMEDIATELY:  *FEVER GREATER THAN 101.0 F  *CHILLS WITH OR WITHOUT FEVER  NAUSEA AND VOMITING THAT IS NOT CONTROLLED WITH YOUR NAUSEA MEDICATION  *UNUSUAL SHORTNESS OF BREATH  *UNUSUAL BRUISING OR BLEEDING  TENDERNESS IN MOUTH AND THROAT WITH OR WITHOUT PRESENCE OF ULCERS  *URINARY PROBLEMS  *BOWEL PROBLEMS  UNUSUAL RASH Items with * indicate a potential emergency and should be followed up as soon as possible.  Return as scheduled.  I have been informed and understand all the instructions given to me. I know to contact the clinic, my physician, or go to the Emergency Department if any problems should occur. I do not have any questions at this time, but understand that I may call the clinic during office hours or the Patient Navigator at [phone] should I have any questions or need assistance in obtaining follow up care.    __________________________________________  _____________  __________ Signature of Patient or Authorized Representative            Date                   Time    __________________________________________ Nurse's Signature

## 2015-07-26 NOTE — Progress Notes (Signed)
Tolerated chemo well. Ambulatory on discharge home to self. 

## 2015-08-19 ENCOUNTER — Encounter (HOSPITAL_BASED_OUTPATIENT_CLINIC_OR_DEPARTMENT_OTHER): Payer: Medicaid Other

## 2015-08-19 ENCOUNTER — Encounter (HOSPITAL_COMMUNITY): Payer: Medicaid Other | Attending: Hematology & Oncology | Admitting: Hematology & Oncology

## 2015-08-19 ENCOUNTER — Encounter (HOSPITAL_COMMUNITY): Payer: Self-pay | Admitting: Hematology & Oncology

## 2015-08-19 VITALS — BP 105/61 | HR 57 | Temp 97.8°F | Resp 16

## 2015-08-19 VITALS — BP 116/68 | HR 63 | Temp 97.8°F | Resp 18 | Wt 218.0 lb

## 2015-08-19 DIAGNOSIS — C787 Secondary malignant neoplasm of liver and intrahepatic bile duct: Secondary | ICD-10-CM

## 2015-08-19 DIAGNOSIS — R809 Proteinuria, unspecified: Secondary | ICD-10-CM

## 2015-08-19 DIAGNOSIS — M545 Low back pain: Secondary | ICD-10-CM

## 2015-08-19 DIAGNOSIS — C7801 Secondary malignant neoplasm of right lung: Secondary | ICD-10-CM

## 2015-08-19 DIAGNOSIS — C187 Malignant neoplasm of sigmoid colon: Secondary | ICD-10-CM | POA: Insufficient documentation

## 2015-08-19 DIAGNOSIS — D500 Iron deficiency anemia secondary to blood loss (chronic): Secondary | ICD-10-CM

## 2015-08-19 DIAGNOSIS — C189 Malignant neoplasm of colon, unspecified: Secondary | ICD-10-CM

## 2015-08-19 DIAGNOSIS — Z5111 Encounter for antineoplastic chemotherapy: Secondary | ICD-10-CM

## 2015-08-19 DIAGNOSIS — C7802 Secondary malignant neoplasm of left lung: Secondary | ICD-10-CM

## 2015-08-19 DIAGNOSIS — G620 Drug-induced polyneuropathy: Secondary | ICD-10-CM

## 2015-08-19 DIAGNOSIS — T451X5A Adverse effect of antineoplastic and immunosuppressive drugs, initial encounter: Secondary | ICD-10-CM

## 2015-08-19 DIAGNOSIS — G629 Polyneuropathy, unspecified: Secondary | ICD-10-CM

## 2015-08-19 LAB — CBC WITH DIFFERENTIAL/PLATELET
BASOS ABS: 0.1 10*3/uL (ref 0.0–0.1)
Basophils Relative: 1 %
EOS PCT: 4 %
Eosinophils Absolute: 0.2 10*3/uL (ref 0.0–0.7)
HEMATOCRIT: 39.7 % (ref 39.0–52.0)
HEMOGLOBIN: 13.4 g/dL (ref 13.0–17.0)
LYMPHS ABS: 1.8 10*3/uL (ref 0.7–4.0)
LYMPHS PCT: 29 %
MCH: 29.4 pg (ref 26.0–34.0)
MCHC: 33.8 g/dL (ref 30.0–36.0)
MCV: 87.1 fL (ref 78.0–100.0)
MONOS PCT: 11 %
Monocytes Absolute: 0.7 10*3/uL (ref 0.1–1.0)
Neutro Abs: 3.3 10*3/uL (ref 1.7–7.7)
Neutrophils Relative %: 55 %
Platelets: 227 10*3/uL (ref 150–400)
RBC: 4.56 MIL/uL (ref 4.22–5.81)
RDW: 14.2 % (ref 11.5–15.5)
WBC: 6.1 10*3/uL (ref 4.0–10.5)

## 2015-08-19 LAB — COMPREHENSIVE METABOLIC PANEL
ALBUMIN: 3.8 g/dL (ref 3.5–5.0)
ALK PHOS: 85 U/L (ref 38–126)
ALT: 19 U/L (ref 17–63)
AST: 25 U/L (ref 15–41)
Anion gap: 7 (ref 5–15)
BILIRUBIN TOTAL: 0.3 mg/dL (ref 0.3–1.2)
BUN: 21 mg/dL — AB (ref 6–20)
CALCIUM: 8.9 mg/dL (ref 8.9–10.3)
CO2: 24 mmol/L (ref 22–32)
Chloride: 109 mmol/L (ref 101–111)
Creatinine, Ser: 0.93 mg/dL (ref 0.61–1.24)
GFR calc Af Amer: >60 mL/min (ref >60)
GLUCOSE: 107 mg/dL — AB (ref 65–99)
POTASSIUM: 4.2 mmol/L (ref 3.5–5.1)
Sodium: 140 mmol/L (ref 135–145)
Total Protein: 6.9 g/dL (ref 6.5–8.1)

## 2015-08-19 MED ORDER — SODIUM CHLORIDE 0.9 % IV SOLN
Freq: Once | INTRAVENOUS | Status: AC
  Administered 2015-08-19: 11:00:00 via INTRAVENOUS

## 2015-08-19 MED ORDER — SODIUM CHLORIDE 0.9 % IV SOLN
340.0000 mg/m2 | Freq: Once | INTRAVENOUS | Status: AC
  Administered 2015-08-19: 700 mg via INTRAVENOUS
  Filled 2015-08-19: qty 14

## 2015-08-19 MED ORDER — PROCHLORPERAZINE MALEATE 10 MG PO TABS
10.0000 mg | ORAL_TABLET | Freq: Once | ORAL | Status: AC
  Administered 2015-08-19: 10 mg via ORAL
  Filled 2015-08-19: qty 1

## 2015-08-19 MED ORDER — LIDOCAINE-PRILOCAINE 2.5-2.5 % EX CREA: 1.0000 "application " | TOPICAL_CREAM | CUTANEOUS | Status: AC | PRN

## 2015-08-19 MED ORDER — SODIUM CHLORIDE 0.9 % IJ SOLN
10.0000 mL | INTRAMUSCULAR | Status: DC | PRN
  Administered 2015-08-19: 10 mL
  Filled 2015-08-19: qty 10

## 2015-08-19 MED ORDER — SODIUM CHLORIDE 0.9 % IV SOLN
8.0000 mg | Freq: Once | INTRAVENOUS | Status: AC
  Administered 2015-08-19: 8 mg via INTRAVENOUS
  Filled 2015-08-19: qty 4

## 2015-08-19 MED ORDER — LEUCOVORIN CALCIUM INJECTION 100 MG
20.0000 mg/m2 | Freq: Once | INTRAMUSCULAR | Status: AC
  Administered 2015-08-19: 42 mg via INTRAVENOUS
  Filled 2015-08-19: qty 2.1

## 2015-08-19 MED ORDER — OXYCODONE-ACETAMINOPHEN 5-325 MG PO TABS: 1.0000 | ORAL_TABLET | ORAL | Status: DC | PRN

## 2015-08-19 MED ORDER — HEPARIN SOD (PORK) LOCK FLUSH 100 UNIT/ML IV SOLN
500.0000 [IU] | Freq: Once | INTRAVENOUS | Status: AC | PRN
  Administered 2015-08-19: 500 [IU]
  Filled 2015-08-19: qty 5

## 2015-08-19 NOTE — Progress Notes (Signed)
Tolerated chemo well. Ambulatory on discharge home with wife. 

## 2015-08-19 NOTE — Patient Instructions (Signed)
[facility] Discharge Instructions for Patients Receiving Chemotherapy   Beginning January 23rd 2017 lab work for the [facility] will be done in the  Main lab at [facility] on 1st floor. If you have a lab appointment with the [facility] please come in thru the  Main Entrance and check in at the main information desk   Today you received the following chemotherapy agents Leucovorin and 5FU Day 1 of 5.  To help prevent nausea and vomiting after your treatment, we encourage you to take your nausea medication as instructed. If you develop nausea and vomiting, or diarrhea that is not controlled by your medication, call the clinic.  The clinic phone number is [phone]. Office hours are Monday-Friday 8:30am-5:00pm.  BELOW ARE SYMPTOMS THAT SHOULD BE REPORTED IMMEDIATELY:  *FEVER GREATER THAN 101.0 F  *CHILLS WITH OR WITHOUT FEVER  NAUSEA AND VOMITING THAT IS NOT CONTROLLED WITH YOUR NAUSEA MEDICATION  *UNUSUAL SHORTNESS OF BREATH  *UNUSUAL BRUISING OR BLEEDING  TENDERNESS IN MOUTH AND THROAT WITH OR WITHOUT PRESENCE OF ULCERS  *URINARY PROBLEMS  *BOWEL PROBLEMS  UNUSUAL RASH Items with * indicate a potential emergency and should be followed up as soon as possible. If you have an emergency after office hours please contact your primary care physician or go to the nearest emergency department.  Please call the clinic during office hours if you have any questions or concerns.   You may also contact the Patient Navigator at [phone] should you have any questions or need assistance in obtaining follow up care.  Resources For Cancer Patients and their Caregivers ? American Cancer Society: Can assist with transportation, wigs, general needs, runs Look Good Feel Better.        941 182 7407 ? Cancer Care: Provides financial assistance, online support groups, medication/co-pay assistance.  1-800-813-HOPE (708)369-2965) ? [place] Assists [place] cancer patients and their families through emotional , educational and financial support.  415-362-0626 ? [place] DSS Where to apply for food stamps, Medicaid and utility assistance. 760-178-5954 ? [facility]: Transportation to medical appointments. (919)836-1341 ? Social Security Administration: May apply for disability if have a Stage IV cancer. 316 861 8509 517-641-9714 ? [employer], Disability and Transit Services: Assists with nutrition, care and transit needs. 403-748-1003

## 2015-08-19 NOTE — Patient Instructions (Addendum)
[place] at [facility] Discharge Instructions  RECOMMENDATIONS MADE BY THE CONSULTANT AND ANY TEST RESULTS WILL BE SENT TO YOUR REFERRING PHYSICIAN.    Exam and discussion by Dr Whitney Muse today Chemotherapy Monday through Friday this week if labs are good  If your back pain gets worse please call us Emla cream sent to [facility] in [place] Oxycodone refilled Return to see the doctor in 28 days and to start chemotherapy again  Please call the clinic if you have any questions or concerns     Thank you for choosing [place] at [facility] to provide your oncology and hematology care.  To afford each patient quality time with our provider, please arrive at least 15 minutes before your scheduled appointment time.   Beginning January 23rd 2017 lab work for the [name] will be done in the  Main lab at [facility] on 1st floor. If you have a lab appointment with the [facility] please come in thru the  Main Entrance and check in at the main information desk  You need to re-schedule your appointment should you arrive 10 or more minutes late.  We strive to give you quality time with our providers, and arriving late affects you and other patients whose appointments are after yours.  Also, if you no show three or more times for appointments you may be dismissed from the clinic at the providers discretion.     Again, thank you for choosing [facility].  Our hope is that these requests will decrease the amount of time that you wait before being seen by our physicians.       _____________________________________________________________  Should you have questions after your visit to [facility], please contact our office at [phone] between the hours of 8:30 a.m. and 4:30 p.m.  Voicemails left after 4:30 p.m. will not be returned until the following business day.  For prescription refill requests, have your pharmacy  contact our office.         Resources For Cancer Patients and their Caregivers ? American Cancer Society: Can assist with transportation, wigs, general needs, runs Look Good Feel Better.        531-674-9793 ? Cancer Care: Provides financial assistance, online support groups, medication/co-pay assistance.  1-800-813-HOPE (351) 508-4305) ? [place] Assists [place] cancer patients and their families through emotional , educational and financial support.  814-603-6110 ? [place] DSS Where to apply for food stamps, Medicaid and utility assistance. ([phone] ? RCATS: Transportation to medical appointments. 515-002-1232 ? Social Security Administration: May apply for disability if have a Stage IV cancer. [phone] ? [employer], Disability and Transit Services: Assists with nutrition, care and transit needs. [phone]

## 2015-08-19 NOTE — Progress Notes (Signed)
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 Last CT scans of the abdomen and pelvis was on 8/31. Last CEA values have been WNL. Weight today is 218 lbs up from the 170's from his initial diagnosis.   Tom [name] is accompanied by his wife. He is scheduled for Cycle #5 5-FU/Leucovorin tomorrow, 08/20/15.  He has been taking oxycodone for back pain to help him sleep. This back pain is intermittent, worse some days than others. His wife states he is constantly "go-go-going" with the grandson. Over the weekend, he did yard work and played fetch with his dog, admitting it was mostly him fetching the ball for the dog.   Reports slight pain in his lower mid-abdomen, attributing this to doing sit ups the other day.   He continues to experience neuropathy mostly in the fingertips and bottom of the feet, this is improved since our last visit.  He has been eating well and gaining weight. Denies any nausea or vomiting.    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 Appearance is most likely to represent diffuse hepatic metastasis   07/30/2014 Initial Diagnosis Colon, biopsy, sigmoid - ADENOCARCINOMA.   07/31/2014 Tumor Marker CEA- 48.0   07/31/2014 Imaging Ct abd/pelvis- concerning for primary colorectal neoplasm in the mid to distal sigmoid colon, with lymphadenopathy in the sigmoid mesocolon, ileocolic mesentery, and  retroperitoneum, as well as widespread metastatic disease to the liver   08/01/2014 Imaging CT chest- Pathologic thoracic, right hilar, and infrahilar adenopathy associated with a masslike region of possible consolidation in the right middle lobe, surrounding nodularity, and some scattered bilateral pulmonary nodules.   08/06/2014 - 12/10/2014 Chemotherapy FOLFOX.  Avastin added for cycle 2.  S/P 10 cycles.  Change to maintenance therapy secondary to positive response to therapy, increasing PN, and progressive proteinuria.   10/08/2014 Adverse Reaction Avastin induced proteinuria >/= 2 gm Avastin on hold   10/15/2014 Treatment Plan Change Avastin held for proteinuria.   10/22/2014 Imaging Ct CAP- 1. Interval decrease and soft tissue adjacent to the mid sigmoid colon with decreasing sigmoid colon wall thickening. The  leocolic, mesenteric, perirectal, and mesocolonic lymphadenopathy has decreased in the interval.   10/30/2014 Survivorship Genetic Counseling. Genetic testing was normal, and did not reveal a deleterious mutation in these genes. The test report will be scanned into EPIC and will be located under the Media tab.    11/26/2014 Treatment Plan Change Oxaliplatin reduced x 25% due to PN.   12/24/2014 Treatment Plan Change Change to maintanence therapy.   01/09/2015 - 03/26/2015 Chemotherapy Xeloda 2500 mg BID, 7 days on and 7 days off.   01/30/2015 Imaging CT in ED- Evidence of distal small bowel obstruction with transition point over the ileum in the right upper pelvis immediately adjacent/abutting the known malignant stricturing of the sigmoid colon. There is wall thickening of the sigmoid colon wi...   01/30/2015 - 01/31/2015 Hospital Admission SBO (small bowel  obstruction)   03/26/2015 Treatment Plan Change Xeloda cost-prohibitive without financial help         I personally reviewed and went over laboratory results with the patient.  The results are noted within this dictation.  Past Medical History    Diagnosis Date  . 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     has No Known Allergies.  We administered Influenza vac split quadrivalent PF, heparin lock flush, and sodium chloride.  Past Surgical History  Procedure Laterality Date  . Esophagogastroduodenoscopy N/A 07/30/2014    Procedure: ESOPHAGOGASTRODUODENOSCOPY (EGD);  Surgeon: Inda Castle, MD;  Location: [facility];  Service: Endoscopy;  Laterality: N/A;  . Colonoscopy N/A 07/30/2014    Procedure: COLONOSCOPY;  Surgeon: Inda Castle, MD;  Location: [facility] ENDOSCOPY;  Service: Endoscopy;  Laterality: N/A;  . Portacath placement Right 08/01/14  . Portacath placement N/A 08/01/2014    Procedure: INSERTION RIGHT IJ PORT-A-CATH WITH ULTRASOUND AND FLUORO;  Surgeon: Gayland Curry, MD;  Location: [facility] ORS;  Service: General;  Laterality: N/A;  . Operative ultrasound N/A 08/01/2014    Procedure: OPERATIVE ULTRASOUND;  Surgeon: Gayland Curry, MD;  Location: [facility] ORS;  Service: General;  Laterality: N/A;    Denies any headaches, dizziness, double vision, fevers, chills, night sweats, nausea, vomiting, diarrhea, chest pain, heart palpitations, shortness of breath, blood in stool, black tarry stool, urinary pain, urinary burning, urinary frequency, hematuria. Positive for weight gain. Positive for back pain.     Managed with oxycodone Positive for tingling.     Neuropathy in fingertips and bottom of feet 14 point review of systems was performed and is negative except as detailed under history of present illness and  above   PHYSICAL EXAMINATION Vitals with BMI 08/19/2015  Height   Weight 218 lbs  BMI   Systolic 678  Diastolic 68  Pulse 63  Respirations 18   ATUS: 0 - Asymptomatic  GENERAL:alert, no distress, well nourished, well developed, comfortable, cooperative and smiling, accompanied by his wife. SKIN: skin color, texture, turgor are normal, no rashes or significant lesions HEAD: Normocephalic, No masses, lesions, tenderness or abnormalities EYES: normal, PERRLA, EOMI, Conjunctiva are pink and non-injected EARS: External ears normal OROPHARYNX:lips, buccal mucosa, and tongue normal and mucous membranes are moist  NECK: supple, trachea midline LYMPH:  No palpable adenopathy in the neck or supraclavicular regions, no axillary adenopathy BREAST:not examined LUNGS: Air to auscultation bilaterally with no wheezing or rhonchi HEART: S1 and S2 audible, regular, no ectopy ABDOMEN:abdomen soft and normal bowel sounds. No palpable liver edge. BACK: Back symmetric, no curvature. EXTREMITIES:less then 2 second capillary refill, no joint deformities, effusion, or inflammation, no cyanosis NEURO: alert & oriented x 3 with fluent speech, no focal motor/sensory deficits, gait normal   LABORATORY DATA: I have reviewed the data below as listed.  Results for TRAYSON, STITELY (MRN [id number])   Ref. Range 08/19/2015 10:14  Sodium Latest Ref Range: 135-145 mmol/L 140  Potassium Latest Ref Range: 3.5-5.1 mmol/L 4.2  Chloride Latest Ref Range: 101-111 mmol/L 109  CO2 Latest Ref Range: 22-32 mmol/L 24  BUN Latest Ref Range: 6-20 mg/dL 21 (H)  Creatinine Latest Ref Range: 0.61-1.24 mg/dL 0.93  Calcium Latest Ref Range: 8.9-10.3 mg/dL 8.9  EGFR (Non-African Amer.) Latest Ref Range: >60 mL/min >60  EGFR (African American) Latest Ref Range: >60 mL/min >60  Glucose Latest Ref Range: 65-99 mg/dL 107 (H)  Anion gap Latest Ref Range: 5-15  7  Alkaline Phosphatase Latest Ref Range: 38-126 U/L 85  Albumin  Latest Ref Range: 3.5-5.0 g/dL 3.8  AST Latest Ref Range: 15-41 U/L 25  ALT Latest Ref Range: 17-63 U/L 19  Total Protein Latest Ref Range: 6.5-8.1 g/dL 6.9  Total Bilirubin Latest Ref Range: 0.3-1.2 mg/dL 0.3  WBC Latest Ref Range: 4.0-10.5 K/uL 6.1  RBC Latest Ref Range: 4.22-5.81 MIL/uL 4.56  Hemoglobin Latest Ref Range: 13.0-17.0 g/dL 13.4  HCT Latest Ref Range: 39.0-52.0 % 39.7  MCV Latest Ref Range: 78.0-100.0 fL 87.1  MCH Latest Ref Range: 26.0-34.0 pg 29.4  MCHC Latest Ref Range: 30.0-36.0 g/dL 33.8  RDW Latest Ref Range: 11.5-15.5 % 14.2  Platelets Latest Ref Range: 150-400 K/uL 227  Neutrophils Latest Units: % 55  Lymphocytes Latest Units: % 29  Monocytes Relative Latest Units: % 11  Eosinophil Latest Units: % 4  Basophil Latest Units: % 1  NEUT# Latest Ref Range: 1.7-7.7 K/uL 3.3  Lymphocyte # Latest Ref Range: 0.7-4.0 K/uL 1.8  Monocyte # Latest Ref Range: 0.1-1.0 K/uL 0.7  Eosinophils Absolute Latest Ref Range: 0.0-0.7 K/uL 0.2  Basophils Absolute Latest Ref Range: 0.0-0.1 K/uL 0.1  WBC Morphology Unknown ATYPICAL LYMPHOCYTES      RADIOLOGY: I have personally reviewed the radiological images as listed and agreed with the findings in the report. Study Result     CLINICAL DATA: Adenocarcinoma of the colon with liver metastasis diagnosed 2/16. Status post chemotherapy.  EXAM: CT CHEST, ABDOMEN, AND PELVIS WITH CONTRAST  TECHNIQUE: Multidetector CT imaging of the chest, abdomen and pelvis was performed following the standard protocol during bolus administration of intravenous contrast.  CONTRAST: 160m OMNIPAQUE IOHEXOL 300 MG/ML SOLN  COMPARISON: 01/30/2015 abdominal pelvic CT. 10/22/2014 chest CT.  FINDINGS: CT CHEST FINDINGS  Mediastinum/Nodes: No supraclavicular adenopathy. A right Port-A-Cath which terminates at the high right atrium. Bovine arch. Aortic and branch vessel atherosclerosis. Normal heart size, without pericardial effusion.  LAD coronary artery atherosclerosis. No central pulmonary embolism, on this non-dedicated study.  Low right paratracheal node measures 5 mm on image 23/series 2 versus 6 mm on the prior.  Subcarinal node measures 5 mm on image 29/series 2 versus 9 mm on the prior. No hilar adenopathy.  Lungs/Pleura: No pleural fluid. Previously described right sided pulmonary nodules are not identified.  A 2 mm left upper lobe pulmonary nodule on image 23/series 6 is nonspecific and not readily apparent on the prior. No lobar consolidation.  Musculoskeletal: No acute osseous abnormality.  CT ABDOMEN PELVIS FINDINGS  Hepatobiliary: Hepatic metastasis. Index segment 8 lesion measures 2.9 x 2.2 cm on image 51/ series 2. 4.0 x 4.8 cm on 01/30/2015.  Index segment 4B lesion measures 2.1 x 2.2 cm on image 61/series 2. Compare 3.2 x 3.8 cm on the prior.  Normal gallbladder, without biliary ductal dilatation.  Pancreas: Normal, without mass or ductal dilatation.  Spleen: Normal in size, without focal abnormality.  Adrenals/Urinary Tract: Normal adrenal glands. Left renal cysts and too small to characterize lesions. Normal right kidney, without hydronephrosis. Normal urinary bladder.  Stomach/Bowel: Normal stomach, without wall thickening. Apparent stricturing at the mid sigmoid, including on image 97/ series 2. Similar. No well-defined mass in this  area. Large stool burden just proximal of this, without high-grade obstruction. Normal terminal ileum and appendix.  The mid to distal ileum is intimately associated with this area on image 100/series 2. Likely adherent. This is the site of bowel obstruction on 01/30/2015. No evidence of residual or recurrent obstruction. Remainder of small bowel unremarkable.  Vascular/Lymphatic: No retroperitoneal or retrocrural adenopathy. No pelvic sidewall adenopathy. Nodes in the left side of the high mesorectum measure up to 8 mm on image  99/series 2. 7 mm on the prior.  Reproductive: Normal prostate.  Other: No significant free fluid. Tiny fat containing bilateral inguinal hernias. No evidence of omental or peritoneal disease.  Musculoskeletal: Spondylosis and degenerative disc disease throughout the lumbosacral spine.  IMPRESSION: CT CHEST IMPRESSION  1. Resolution right-sided pulmonary nodules and decrease in size of small mediastinal nodes. 2. A left upper lobe pulmonary nodule is indeterminate and not readily apparent on the prior exam. This may represent a subpleural lymph node. Recommend attention on follow-up. Age advanced coronary artery atherosclerosis. Recommend assessment of coronary risk factors and consideration of medical therapy.  CT ABDOMEN AND PELVIS IMPRESSION  1. Response to therapy of hepatic metastasis. 2. Stricture at the mid sigmoid colon, the site of prior malignancy. No well-defined residual soft tissue mass is seen. Large stool burden proximal to this site suggests constipation and perhaps a component of subclinical obstruction. No high-grade obstruction seen. 3. Small bowel again contacting this point of the sigmoid, likely adherent. Resolution of small bowel obstruction. 4. Similar to slight increase in suspicious nodes in the high mesorectum.   Electronically Signed  By: Abigail Miyamoto M.D.  On: 07/03/2015 13:41     ASSESSMENT AND PLAN:  Stage IV colon cancer, KRAS WT  50 year old male with stage IV colon cancer he has had a significant improvement in his disease and clinical status.He is currently on single agent 5-FU with excellent tolerance. His PS is excellent. CEA is WNL.  Unfortunately AVASTIN had to be discontinued secondary to persistent proteinuria.   CT imaging was reviewed with the patient. Ongoing improvement in disease is noted. He denies difficulty passing stool. He was encouraged to keep his stool soft.  Plan is for ongoing therapy with  5-FU.  Genetics Counseling  He has completed genetics counseling. His genetic test results were normal--i.e. testing found no meaningful changes to any of 19 genes associated with increased risks for colorectal and other types of cancers.  Nephrotic Range Proteinuria  He has been off of Avastin which is most commonly implicated in proteinuria since May 2. He has been seen by nephrology.   THERAPY PLAN:  Continue therapy a planned   He will contact us if his back pain worsens.  He will return in 1 month for follow up.  All questions were answered. The patient knows to call the clinic with any problems, questions or concerns. We can certainly see the patient much sooner if necessary.   This document serves as a record of services personally performed by Ancil Linsey, MD. It was created on her behalf by Arlyce Harman, a trained medical scribe. The creation of this record is based on the scribe's personal observations and the provider's statements to them. This document has been checked and approved by the attending provider.  I have reviewed the above documentation for accuracy and completeness, and I agree with the above.  This note was electronically signed.  Kelby Fam. Whitney Muse, MD

## 2015-08-20 ENCOUNTER — Encounter (HOSPITAL_COMMUNITY): Payer: Self-pay

## 2015-08-20 ENCOUNTER — Encounter (HOSPITAL_BASED_OUTPATIENT_CLINIC_OR_DEPARTMENT_OTHER): Payer: Medicaid Other

## 2015-08-20 VITALS — BP 101/61 | HR 84 | Temp 98.4°F | Resp 16 | Wt 220.6 lb

## 2015-08-20 DIAGNOSIS — C189 Malignant neoplasm of colon, unspecified: Secondary | ICD-10-CM | POA: Diagnosis not present

## 2015-08-20 DIAGNOSIS — Z5111 Encounter for antineoplastic chemotherapy: Secondary | ICD-10-CM

## 2015-08-20 DIAGNOSIS — C787 Secondary malignant neoplasm of liver and intrahepatic bile duct: Secondary | ICD-10-CM | POA: Diagnosis not present

## 2015-08-20 LAB — CEA: CEA: 1.7 ng/mL (ref 0.0–4.7)

## 2015-08-20 MED ORDER — HEPARIN SOD (PORK) LOCK FLUSH 100 UNIT/ML IV SOLN
INTRAVENOUS | Status: AC
  Filled 2015-08-20: qty 5

## 2015-08-20 MED ORDER — HEPARIN SOD (PORK) LOCK FLUSH 100 UNIT/ML IV SOLN
500.0000 [IU] | Freq: Once | INTRAVENOUS | Status: AC | PRN
  Administered 2015-08-20: 500 [IU]

## 2015-08-20 MED ORDER — SODIUM CHLORIDE 0.9 % IV SOLN
Freq: Once | INTRAVENOUS | Status: AC
  Administered 2015-08-20: 10:00:00 via INTRAVENOUS

## 2015-08-20 MED ORDER — SODIUM CHLORIDE 0.9 % IV SOLN
340.0000 mg/m2 | Freq: Once | INTRAVENOUS | Status: AC
  Administered 2015-08-20: 700 mg via INTRAVENOUS
  Filled 2015-08-20: qty 14

## 2015-08-20 MED ORDER — LEUCOVORIN CALCIUM INJECTION 100 MG
20.0000 mg/m2 | Freq: Once | INTRAMUSCULAR | Status: AC
  Administered 2015-08-20: 42 mg via INTRAVENOUS
  Filled 2015-08-20: qty 2.1

## 2015-08-20 MED ORDER — SODIUM CHLORIDE 0.9 % IV SOLN
8.0000 mg | Freq: Once | INTRAVENOUS | Status: AC
  Administered 2015-08-20: 8 mg via INTRAVENOUS
  Filled 2015-08-20: qty 4

## 2015-08-20 MED ORDER — PROCHLORPERAZINE MALEATE 10 MG PO TABS
10.0000 mg | ORAL_TABLET | Freq: Once | ORAL | Status: AC
  Administered 2015-08-20: 10 mg via ORAL

## 2015-08-20 MED ORDER — SODIUM CHLORIDE 0.9 % IJ SOLN
10.0000 mL | INTRAMUSCULAR | Status: DC | PRN
  Administered 2015-08-20: 10 mL
  Filled 2015-08-20: qty 10

## 2015-08-20 MED ORDER — PROCHLORPERAZINE MALEATE 10 MG PO TABS
ORAL_TABLET | ORAL | Status: AC
  Filled 2015-08-20: qty 1

## 2015-08-20 NOTE — Progress Notes (Signed)
Tolerated chemo well. Ambulatory on discharge home with wife. 

## 2015-08-21 ENCOUNTER — Encounter (HOSPITAL_BASED_OUTPATIENT_CLINIC_OR_DEPARTMENT_OTHER): Payer: Medicaid Other

## 2015-08-21 VITALS — BP 118/69 | HR 57 | Temp 97.7°F | Resp 16 | Wt 219.6 lb

## 2015-08-21 DIAGNOSIS — C189 Malignant neoplasm of colon, unspecified: Secondary | ICD-10-CM

## 2015-08-21 DIAGNOSIS — C787 Secondary malignant neoplasm of liver and intrahepatic bile duct: Secondary | ICD-10-CM

## 2015-08-21 DIAGNOSIS — Z5111 Encounter for antineoplastic chemotherapy: Secondary | ICD-10-CM

## 2015-08-21 MED ORDER — SODIUM CHLORIDE 0.9 % IV SOLN
Freq: Once | INTRAVENOUS | Status: AC
  Administered 2015-08-21: 10:00:00 via INTRAVENOUS

## 2015-08-21 MED ORDER — PROCHLORPERAZINE MALEATE 10 MG PO TABS
10.0000 mg | ORAL_TABLET | Freq: Once | ORAL | Status: AC
  Administered 2015-08-21: 10 mg via ORAL

## 2015-08-21 MED ORDER — SODIUM CHLORIDE 0.9 % IJ SOLN
10.0000 mL | INTRAMUSCULAR | Status: DC | PRN
  Administered 2015-08-21: 10 mL
  Filled 2015-08-21: qty 10

## 2015-08-21 MED ORDER — HEPARIN SOD (PORK) LOCK FLUSH 100 UNIT/ML IV SOLN
500.0000 [IU] | Freq: Once | INTRAVENOUS | Status: AC | PRN
  Administered 2015-08-21: 500 [IU]

## 2015-08-21 MED ORDER — LEUCOVORIN CALCIUM INJECTION 100 MG
20.0000 mg/m2 | Freq: Once | INTRAMUSCULAR | Status: AC
  Administered 2015-08-21: 42 mg via INTRAVENOUS
  Filled 2015-08-21: qty 2.1

## 2015-08-21 MED ORDER — SODIUM CHLORIDE 0.9 % IV SOLN
8.0000 mg | Freq: Once | INTRAVENOUS | Status: AC
  Administered 2015-08-21: 8 mg via INTRAVENOUS
  Filled 2015-08-21: qty 4

## 2015-08-21 MED ORDER — SODIUM CHLORIDE 0.9 % IV SOLN
340.0000 mg/m2 | Freq: Once | INTRAVENOUS | Status: AC
  Administered 2015-08-21: 700 mg via INTRAVENOUS
  Filled 2015-08-21: qty 14

## 2015-08-21 MED ORDER — PROCHLORPERAZINE MALEATE 10 MG PO TABS
ORAL_TABLET | ORAL | Status: AC
  Filled 2015-08-21: qty 1

## 2015-08-21 MED ORDER — HEPARIN SOD (PORK) LOCK FLUSH 100 UNIT/ML IV SOLN
INTRAVENOUS | Status: AC
  Filled 2015-08-21: qty 5

## 2015-08-21 NOTE — Patient Instructions (Signed)
[facility] Discharge Instructions for Patients Receiving Chemotherapy   Beginning January 23rd 2017 lab work for the [facility] will be done in the  Main lab at [facility] on 1st floor. If you have a lab appointment with the [facility] please come in thru the  Main Entrance and check in at the main information desk   Today you received the following chemotherapy agents Leucovorin and 27fu Day 3 of 5.  To help prevent nausea and vomiting after your treatment, we encourage you to take your nausea medication as instructed. If you develop nausea and vomiting, or diarrhea that is not controlled by your medication, call the clinic.  The clinic phone number is [phone]. Office hours are Monday-Friday 8:30am-5:00pm.  BELOW ARE SYMPTOMS THAT SHOULD BE REPORTED IMMEDIATELY:  *FEVER GREATER THAN 101.0 F  *CHILLS WITH OR WITHOUT FEVER  NAUSEA AND VOMITING THAT IS NOT CONTROLLED WITH YOUR NAUSEA MEDICATION  *UNUSUAL SHORTNESS OF BREATH  *UNUSUAL BRUISING OR BLEEDING  TENDERNESS IN MOUTH AND THROAT WITH OR WITHOUT PRESENCE OF ULCERS  *URINARY PROBLEMS  *BOWEL PROBLEMS  UNUSUAL RASH Items with * indicate a potential emergency and should be followed up as soon as possible. If you have an emergency after office hours please contact your primary care physician or go to the nearest emergency department.  Please call the clinic during office hours if you have any questions or concerns.   You may also contact the Patient Navigator at [phone] should you have any questions or need assistance in obtaining follow up care.  Resources For Cancer Patients and their Caregivers ? American Cancer Society: Can assist with transportation, wigs, general needs, runs Look Good Feel Better.        9132961798 ? Cancer Care: Provides financial assistance, online support groups, medication/co-pay assistance.  1-800-813-HOPE 304-267-7299) ? [place] Assists [place] cancer patients and their families through emotional , educational and financial support.  [phone] ? [place] DSS Where to apply for food stamps, Medicaid and utility assistance. [phone] ? RCATS: Transportation to medical appointments. [phone] ? Social Security Administration: May apply for disability if have a Stage IV cancer. [phone] ? [employer], Disability and Transit Services: Assists with nutrition, care and transit needs. [phone]

## 2015-08-21 NOTE — Progress Notes (Signed)
Tolerated chemo well. Ambulatory on discharge home with wife. 

## 2015-08-22 ENCOUNTER — Encounter (HOSPITAL_BASED_OUTPATIENT_CLINIC_OR_DEPARTMENT_OTHER): Payer: Medicaid Other

## 2015-08-22 VITALS — BP 102/43 | HR 57 | Temp 97.6°F | Resp 18 | Wt 219.0 lb

## 2015-08-22 DIAGNOSIS — C787 Secondary malignant neoplasm of liver and intrahepatic bile duct: Secondary | ICD-10-CM | POA: Diagnosis not present

## 2015-08-22 DIAGNOSIS — Z5111 Encounter for antineoplastic chemotherapy: Secondary | ICD-10-CM

## 2015-08-22 DIAGNOSIS — C189 Malignant neoplasm of colon, unspecified: Secondary | ICD-10-CM

## 2015-08-22 MED ORDER — PROCHLORPERAZINE MALEATE 10 MG PO TABS
10.0000 mg | ORAL_TABLET | Freq: Once | ORAL | Status: AC
  Administered 2015-08-22: 10 mg via ORAL

## 2015-08-22 MED ORDER — SODIUM CHLORIDE 0.9 % IV SOLN
Freq: Once | INTRAVENOUS | Status: AC
Start: 2015-08-22 — End: 2015-08-22
  Administered 2015-08-22: 10:00:00 via INTRAVENOUS

## 2015-08-22 MED ORDER — HEPARIN SOD (PORK) LOCK FLUSH 100 UNIT/ML IV SOLN
INTRAVENOUS | Status: AC
  Filled 2015-08-22: qty 5

## 2015-08-22 MED ORDER — PROCHLORPERAZINE MALEATE 10 MG PO TABS
ORAL_TABLET | ORAL | Status: AC
  Filled 2015-08-22: qty 1

## 2015-08-22 MED ORDER — SODIUM CHLORIDE 0.9 % IV SOLN
340.0000 mg/m2 | Freq: Once | INTRAVENOUS | Status: AC
  Administered 2015-08-22: 700 mg via INTRAVENOUS
  Filled 2015-08-22: qty 14

## 2015-08-22 MED ORDER — LEUCOVORIN CALCIUM INJECTION 100 MG
20.0000 mg/m2 | Freq: Once | INTRAMUSCULAR | Status: AC
  Administered 2015-08-22: 42 mg via INTRAVENOUS
  Filled 2015-08-22: qty 2.1

## 2015-08-22 MED ORDER — SODIUM CHLORIDE 0.9 % IJ SOLN
10.0000 mL | INTRAMUSCULAR | Status: DC | PRN
  Administered 2015-08-22: 10 mL
  Filled 2015-08-22: qty 10

## 2015-08-22 MED ORDER — HEPARIN SOD (PORK) LOCK FLUSH 100 UNIT/ML IV SOLN
500.0000 [IU] | Freq: Once | INTRAVENOUS | Status: AC | PRN
  Administered 2015-08-22: 500 [IU]

## 2015-08-22 MED ORDER — SODIUM CHLORIDE 0.9 % IV SOLN
8.0000 mg | Freq: Once | INTRAVENOUS | Status: AC
  Administered 2015-08-22: 8 mg via INTRAVENOUS
  Filled 2015-08-22: qty 4

## 2015-08-22 NOTE — Progress Notes (Signed)
Tolerated chemo well. Ambulatory on discharge home to self. 

## 2015-08-22 NOTE — Patient Instructions (Signed)
[facility] Discharge Instructions for Patients Receiving Chemotherapy   Beginning January 23rd 2017 lab work for the [facility] will be done in the  Main lab at [facility] on 1st floor. If you have a lab appointment with the [facility] please come in thru the  Main Entrance and check in at the main information desk   Today you received the following chemotherapy agents Vidaza day 4 of 5.  To help prevent nausea and vomiting after your treatment, we encourage you to take your nausea medication as instructed. If you develop nausea and vomiting, or diarrhea that is not controlled by your medication, call the clinic.  The clinic phone number is [phone]. Office hours are Monday-Friday 8:30am-5:00pm.  BELOW ARE SYMPTOMS THAT SHOULD BE REPORTED IMMEDIATELY:  *FEVER GREATER THAN 101.0 F  *CHILLS WITH OR WITHOUT FEVER  NAUSEA AND VOMITING THAT IS NOT CONTROLLED WITH YOUR NAUSEA MEDICATION  *UNUSUAL SHORTNESS OF BREATH  *UNUSUAL BRUISING OR BLEEDING  TENDERNESS IN MOUTH AND THROAT WITH OR WITHOUT PRESENCE OF ULCERS  *URINARY PROBLEMS  *BOWEL PROBLEMS  UNUSUAL RASH Items with * indicate a potential emergency and should be followed up as soon as possible. If you have an emergency after office hours please contact your primary care physician or go to the nearest emergency department.  Please call the clinic during office hours if you have any questions or concerns.   You may also contact the Patient Navigator at [phone] should you have any questions or need assistance in obtaining follow up care.  Resources For Cancer Patients and their Caregivers ? American Cancer Society: Can assist with transportation, wigs, general needs, runs Look Good Feel Better.        669-040-4971 ? Cancer Care: Provides financial assistance, online support groups, medication/co-pay assistance.  1-800-813-HOPE 5643478142) ? [place] Assists  [place] cancer patients and their families through emotional , educational and financial support.  ([phone] ? [place] DSS Where to apply for food stamps, Medicaid and utility assistance. 718-557-4437 ? RCATS: Transportation to medical appointments. 6036371554 ? Social Security Administration: May apply for disability if have a Stage IV cancer. 631-877-4950 228-419-2024 ? [employer], Disability and Transit Services: Assists with nutrition, care and transit needs. [phone]

## 2015-08-23 ENCOUNTER — Encounter (HOSPITAL_BASED_OUTPATIENT_CLINIC_OR_DEPARTMENT_OTHER): Payer: Medicaid Other

## 2015-08-23 VITALS — BP 102/61 | HR 71 | Temp 98.2°F | Resp 18 | Wt 219.0 lb

## 2015-08-23 DIAGNOSIS — Z5111 Encounter for antineoplastic chemotherapy: Secondary | ICD-10-CM | POA: Diagnosis present

## 2015-08-23 DIAGNOSIS — C189 Malignant neoplasm of colon, unspecified: Secondary | ICD-10-CM

## 2015-08-23 DIAGNOSIS — C787 Secondary malignant neoplasm of liver and intrahepatic bile duct: Secondary | ICD-10-CM

## 2015-08-23 MED ORDER — HEPARIN SOD (PORK) LOCK FLUSH 100 UNIT/ML IV SOLN
INTRAVENOUS | Status: AC
  Filled 2015-08-23: qty 5

## 2015-08-23 MED ORDER — HEPARIN SOD (PORK) LOCK FLUSH 100 UNIT/ML IV SOLN
500.0000 [IU] | Freq: Once | INTRAVENOUS | Status: AC | PRN
  Administered 2015-08-23: 500 [IU]

## 2015-08-23 MED ORDER — SODIUM CHLORIDE 0.9 % IJ SOLN
10.0000 mL | INTRAMUSCULAR | Status: DC | PRN
  Administered 2015-08-23: 10 mL
  Filled 2015-08-23: qty 10

## 2015-08-23 MED ORDER — FLUOROURACIL CHEMO INJECTION 2.5 GM/50ML
340.0000 mg/m2 | Freq: Once | INTRAVENOUS | Status: AC
  Administered 2015-08-23: 700 mg via INTRAVENOUS
  Filled 2015-08-23: qty 14

## 2015-08-23 MED ORDER — LEUCOVORIN CALCIUM INJECTION 100 MG
20.0000 mg/m2 | Freq: Once | INTRAMUSCULAR | Status: AC
  Administered 2015-08-23: 42 mg via INTRAVENOUS
  Filled 2015-08-23: qty 2.1

## 2015-08-23 MED ORDER — SODIUM CHLORIDE 0.9 % IV SOLN
Freq: Once | INTRAVENOUS | Status: AC
  Administered 2015-08-23: 10:00:00 via INTRAVENOUS

## 2015-08-23 MED ORDER — SODIUM CHLORIDE 0.9 % IV SOLN
8.0000 mg | Freq: Once | INTRAVENOUS | Status: AC
  Administered 2015-08-23: 8 mg via INTRAVENOUS
  Filled 2015-08-23: qty 4

## 2015-08-23 MED ORDER — PROCHLORPERAZINE MALEATE 10 MG PO TABS
10.0000 mg | ORAL_TABLET | Freq: Once | ORAL | Status: AC
  Administered 2015-08-23: 10 mg via ORAL

## 2015-08-23 MED ORDER — PROCHLORPERAZINE MALEATE 10 MG PO TABS
ORAL_TABLET | ORAL | Status: AC
  Filled 2015-08-23: qty 1

## 2015-08-23 NOTE — Progress Notes (Signed)
Tolerated chemo well. Ambulatory on discharge home to self. 

## 2015-08-23 NOTE — Patient Instructions (Signed)
[facility] Discharge Instructions for Patients Receiving Chemotherapy   Beginning January 23rd 2017 lab work for the [facility] will be done in the  Main lab at [facility] on 1st floor. If you have a lab appointment with the [facility] please come in thru the  Main Entrance and check in at the main information desk   Today you received the following chemotherapy agents leucovorin and 71fu Day 5 of 5.  To help prevent nausea and vomiting after your treatment, we encourage you to take your nausea medication as instructed.   If you develop nausea and vomiting, or diarrhea that is not controlled by your medication, call the clinic.  The clinic phone number is [phone]. Office hours are Monday-Friday 8:30am-5:00pm.  BELOW ARE SYMPTOMS THAT SHOULD BE REPORTED IMMEDIATELY:  *FEVER GREATER THAN 101.0 F  *CHILLS WITH OR WITHOUT FEVER  NAUSEA AND VOMITING THAT IS NOT CONTROLLED WITH YOUR NAUSEA MEDICATION  *UNUSUAL SHORTNESS OF BREATH  *UNUSUAL BRUISING OR BLEEDING  TENDERNESS IN MOUTH AND THROAT WITH OR WITHOUT PRESENCE OF ULCERS  *URINARY PROBLEMS  *BOWEL PROBLEMS  UNUSUAL RASH Items with * indicate a potential emergency and should be followed up as soon as possible. If you have an emergency after office hours please contact your primary care physician or go to the nearest emergency department.  Please call the clinic during office hours if you have any questions or concerns.   You may also contact the Patient Navigator at [phone] should you have any questions or need assistance in obtaining follow up care.  Resources For Cancer Patients and their Caregivers ? American Cancer Society: Can assist with transportation, wigs, general needs, runs Look Good Feel Better.        213-126-1119 ? Cancer Care: Provides financial assistance, online support groups, medication/co-pay assistance.  1-800-813-HOPE (385)014-3642) ? [place] Assists [place] cancer patients and their families through emotional , educational and financial support.  [phone] ? [place] DSS Where to apply for food stamps, Medicaid and utility assistance. [phone] ? RCATS: Transportation to medical appointments. 314-668-9411 ? Social Security Administration: May apply for disability if have a Stage IV cancer. 928-830-9821 [phone] ? [employer], Disability and Transit Services: Assists with nutrition, care and transit needs. [phone]

## 2015-09-16 ENCOUNTER — Encounter (HOSPITAL_COMMUNITY): Payer: Medicaid Other | Attending: Hematology & Oncology | Admitting: Hematology & Oncology

## 2015-09-16 ENCOUNTER — Encounter (HOSPITAL_COMMUNITY): Payer: Self-pay | Admitting: Hematology & Oncology

## 2015-09-16 ENCOUNTER — Encounter (HOSPITAL_BASED_OUTPATIENT_CLINIC_OR_DEPARTMENT_OTHER): Payer: Medicaid Other

## 2015-09-16 VITALS — BP 110/77 | HR 66 | Temp 98.0°F | Resp 18

## 2015-09-16 VITALS — BP 120/77 | HR 66 | Temp 98.2°F | Resp 18 | Wt 218.7 lb

## 2015-09-16 DIAGNOSIS — C7801 Secondary malignant neoplasm of right lung: Secondary | ICD-10-CM

## 2015-09-16 DIAGNOSIS — C787 Secondary malignant neoplasm of liver and intrahepatic bile duct: Secondary | ICD-10-CM | POA: Diagnosis not present

## 2015-09-16 DIAGNOSIS — M545 Low back pain: Secondary | ICD-10-CM

## 2015-09-16 DIAGNOSIS — R21 Rash and other nonspecific skin eruption: Secondary | ICD-10-CM

## 2015-09-16 DIAGNOSIS — C189 Malignant neoplasm of colon, unspecified: Secondary | ICD-10-CM | POA: Diagnosis not present

## 2015-09-16 DIAGNOSIS — C229 Malignant neoplasm of liver, not specified as primary or secondary: Secondary | ICD-10-CM | POA: Diagnosis present

## 2015-09-16 DIAGNOSIS — Z9221 Personal history of antineoplastic chemotherapy: Secondary | ICD-10-CM | POA: Diagnosis not present

## 2015-09-16 DIAGNOSIS — Z9889 Other specified postprocedural states: Secondary | ICD-10-CM | POA: Diagnosis not present

## 2015-09-16 DIAGNOSIS — G629 Polyneuropathy, unspecified: Secondary | ICD-10-CM | POA: Diagnosis not present

## 2015-09-16 DIAGNOSIS — T451X5A Adverse effect of antineoplastic and immunosuppressive drugs, initial encounter: Secondary | ICD-10-CM

## 2015-09-16 DIAGNOSIS — C7802 Secondary malignant neoplasm of left lung: Secondary | ICD-10-CM

## 2015-09-16 DIAGNOSIS — M549 Dorsalgia, unspecified: Secondary | ICD-10-CM | POA: Diagnosis not present

## 2015-09-16 DIAGNOSIS — R809 Proteinuria, unspecified: Secondary | ICD-10-CM | POA: Diagnosis not present

## 2015-09-16 DIAGNOSIS — Z5111 Encounter for antineoplastic chemotherapy: Secondary | ICD-10-CM | POA: Diagnosis present

## 2015-09-16 DIAGNOSIS — G620 Drug-induced polyneuropathy: Secondary | ICD-10-CM

## 2015-09-16 LAB — CBC WITH DIFFERENTIAL/PLATELET
BASOS PCT: 1 %
Basophils Absolute: 0 10*3/uL (ref 0.0–0.1)
Eosinophils Absolute: 0.2 10*3/uL (ref 0.0–0.7)
Eosinophils Relative: 3 %
HEMATOCRIT: 39.8 % (ref 39.0–52.0)
Hemoglobin: 13.6 g/dL (ref 13.0–17.0)
Lymphocytes Relative: 38 %
Lymphs Abs: 2.1 10*3/uL (ref 0.7–4.0)
MCH: 29.8 pg (ref 26.0–34.0)
MCHC: 34.2 g/dL (ref 30.0–36.0)
MCV: 87.1 fL (ref 78.0–100.0)
MONO ABS: 0.5 10*3/uL (ref 0.1–1.0)
MONOS PCT: 9 %
NEUTROS ABS: 2.8 10*3/uL (ref 1.7–7.7)
Neutrophils Relative %: 49 %
Platelets: 210 10*3/uL (ref 150–400)
RBC: 4.57 MIL/uL (ref 4.22–5.81)
RDW: 14.8 % (ref 11.5–15.5)
WBC: 5.6 10*3/uL (ref 4.0–10.5)

## 2015-09-16 LAB — COMPREHENSIVE METABOLIC PANEL
ALBUMIN: 4 g/dL (ref 3.5–5.0)
ALT: 20 U/L (ref 17–63)
ANION GAP: 10 (ref 5–15)
AST: 23 U/L (ref 15–41)
Alkaline Phosphatase: 76 U/L (ref 38–126)
BILIRUBIN TOTAL: 0.4 mg/dL (ref 0.3–1.2)
BUN: 27 mg/dL — ABNORMAL HIGH (ref 6–20)
CO2: 23 mmol/L (ref 22–32)
Calcium: 8.8 mg/dL — ABNORMAL LOW (ref 8.9–10.3)
Chloride: 110 mmol/L (ref 101–111)
Creatinine, Ser: 1.03 mg/dL (ref 0.61–1.24)
Glucose, Bld: 119 mg/dL — ABNORMAL HIGH (ref 65–99)
POTASSIUM: 4.4 mmol/L (ref 3.5–5.1)
Sodium: 143 mmol/L (ref 135–145)
TOTAL PROTEIN: 7 g/dL (ref 6.5–8.1)

## 2015-09-16 MED ORDER — LEUCOVORIN CALCIUM INJECTION 100 MG
20.0000 mg/m2 | Freq: Once | INTRAMUSCULAR | Status: AC
  Administered 2015-09-16: 42 mg via INTRAVENOUS
  Filled 2015-09-16: qty 2.1

## 2015-09-16 MED ORDER — PROCHLORPERAZINE MALEATE 10 MG PO TABS
ORAL_TABLET | ORAL | Status: AC
  Filled 2015-09-16: qty 1

## 2015-09-16 MED ORDER — PROCHLORPERAZINE MALEATE 10 MG PO TABS
10.0000 mg | ORAL_TABLET | Freq: Once | ORAL | Status: AC
  Administered 2015-09-16: 10 mg via ORAL

## 2015-09-16 MED ORDER — TRIAMCINOLONE ACETONIDE 0.1 % EX CREA: 1.0000 "application " | TOPICAL_CREAM | Freq: Two times a day (BID) | CUTANEOUS | Status: AC

## 2015-09-16 MED ORDER — SODIUM CHLORIDE 0.9 % IV SOLN
340.0000 mg/m2 | Freq: Once | INTRAVENOUS | Status: AC
  Administered 2015-09-16: 700 mg via INTRAVENOUS
  Filled 2015-09-16: qty 14

## 2015-09-16 MED ORDER — HEPARIN SOD (PORK) LOCK FLUSH 100 UNIT/ML IV SOLN
INTRAVENOUS | Status: AC
  Filled 2015-09-16: qty 5

## 2015-09-16 MED ORDER — SODIUM CHLORIDE 0.9 % IJ SOLN: 10.0000 mL | INTRAMUSCULAR | Status: DC | PRN

## 2015-09-16 MED ORDER — HEPARIN SOD (PORK) LOCK FLUSH 100 UNIT/ML IV SOLN
500.0000 [IU] | Freq: Once | INTRAVENOUS | Status: AC | PRN
  Administered 2015-09-16: 500 [IU]

## 2015-09-16 MED ORDER — SODIUM CHLORIDE 0.9 % IV SOLN
8.0000 mg | Freq: Once | INTRAVENOUS | Status: AC
  Administered 2015-09-16: 8 mg via INTRAVENOUS
  Filled 2015-09-16: qty 4

## 2015-09-16 MED ORDER — SODIUM CHLORIDE 0.9 % IV SOLN
Freq: Once | INTRAVENOUS | Status: AC
  Administered 2015-09-16: 12:00:00 via INTRAVENOUS

## 2015-09-16 NOTE — Patient Instructions (Signed)
[facility] Discharge Instructions for Patients Receiving Chemotherapy   Beginning January 23rd 2017 lab work for the [facility] will be done in the  Main lab at [facility] on 1st floor. If you have a lab appointment with the [facility] please come in thru the  Main Entrance and check in at the main information desk   Today you received the following chemotherapy agents: Leucovorin and 5FU.   Please call us with any questions or concerns.     If you develop nausea and vomiting, or diarrhea that is not controlled by your medication, call the clinic.  The clinic phone number is [phone]. Office hours are Monday-Friday 8:30am-5:00pm.  BELOW ARE SYMPTOMS THAT SHOULD BE REPORTED IMMEDIATELY:  *FEVER GREATER THAN 101.0 F  *CHILLS WITH OR WITHOUT FEVER  NAUSEA AND VOMITING THAT IS NOT CONTROLLED WITH YOUR NAUSEA MEDICATION  *UNUSUAL SHORTNESS OF BREATH  *UNUSUAL BRUISING OR BLEEDING  TENDERNESS IN MOUTH AND THROAT WITH OR WITHOUT PRESENCE OF ULCERS  *URINARY PROBLEMS  *BOWEL PROBLEMS  UNUSUAL RASH Items with * indicate a potential emergency and should be followed up as soon as possible. If you have an emergency after office hours please contact your primary care physician or go to the nearest emergency department.  Please call the clinic during office hours if you have any questions or concerns.   You may also contact the Patient Navigator at [phone] should you have any questions or need assistance in obtaining follow up care.      Resources For Cancer Patients and their Caregivers ? American Cancer Society: Can assist with transportation, wigs, general needs, runs Look Good Feel Better.        [phone] ? Cancer Care: Provides financial assistance, online support groups, medication/co-pay assistance.  1-800-813-HOPE 3861763270) ? [place] Assists [place] cancer patients and their families through  emotional , educational and financial support.  [phone] ? [place] DSS Where to apply for food stamps, Medicaid and utility assistance. 2343775962 ? [employer]ATS: Transportation to medical appointments. 218-147-1250 ? Social Security Administration: May apply for disability if have a Stage IV cancer. [phone] ? [employer], Disability and Transit Services: Assists with nutrition, care and transit needs. [phone]

## 2015-09-16 NOTE — Progress Notes (Signed)
Patient tolerated infusion well.  VSS.   

## 2015-09-16 NOTE — Progress Notes (Signed)
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 Last CT scans of the abdomen and pelvis were on 07/03/2015. Last CEA values have been WNL.    Mr. Helle was here with his son today.  He has been having lower left back pain at night when he lays down to go to bed. He said that it feels like his muscles tense up. He thinks that maybe this is because he has been doing more around the house now. He has been cleaning around the house and cutting tree limbs. The pain is not every day just every once in a while. He took 2 pain pills last night and was able to sleep well. This pain does not keep him from doing the things he wants to do.    He walked a mile this morning at a steady pace and his back pain felt better after he was done walking.   His left leg is itchy every once in a while and when it does he puts lotion on it. He continues to have the rash and notes that it has not spread. He has not tried OTC hydrocortisone.   He says that still has neuropathy in the tips of his fingers.   His appetite is good.  He denies any nausea, vomiting, or abdominal pain. He has had no problem with his ostomy.   He does not need any prescription refills.   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 Appearance is most likely to represent diffuse hepatic metastasis   07/30/2014 Initial Diagnosis Colon, biopsy, sigmoid - ADENOCARCINOMA.   07/31/2014 Tumor Marker CEA- 48.0   07/31/2014 Imaging Ct abd/pelvis- concerning for primary colorectal neoplasm in the mid to distal sigmoid colon, with lymphadenopathy in the sigmoid mesocolon, ileocolic mesentery, and retroperitoneum, as well as widespread metastatic disease to the liver   08/01/2014 Imaging CT chest- Pathologic thoracic, right hilar, and infrahilar adenopathy associated with a masslike region of possible consolidation in the right middle lobe, surrounding nodularity, and some scattered bilateral pulmonary nodules.   08/06/2014 - 12/10/2014 Chemotherapy FOLFOX.  Avastin added for cycle 2.  S/P 10 cycles.  Change to maintenance therapy secondary to positive response to therapy, increasing PN, and progressive proteinuria.   10/08/2014 Adverse Reaction Avastin induced proteinuria >/= 2 gm Avastin on hold   10/15/2014 Treatment Plan Change Avastin held for proteinuria.   10/22/2014 Imaging Ct CAP- 1. Interval decrease and soft tissue adjacent to the mid sigmoid colon with decreasing sigmoid colon wall thickening. The  leocolic, mesenteric, perirectal, and mesocolonic lymphadenopathy has decreased in the interval.   10/30/2014 Survivorship Genetic Counseling. Genetic testing was normal, and did not reveal a deleterious mutation in these genes. The test report will be scanned into EPIC and will be located under the Media tab.    11/26/2014 Treatment Plan Change Oxaliplatin reduced x 25% due to PN.   12/24/2014 Treatment Plan Change Change to [facility]  therapy.   01/09/2015 - 03/26/2015 Chemotherapy Xeloda 2500 mg BID, 7 days on and 7 days off.   01/30/2015 Imaging CT in ED- Evidence of distal small bowel obstruction with transition point over the ileum in the right upper pelvis immediately adjacent/abutting the known malignant stricturing of the sigmoid colon. There is wall thickening of the sigmoid colon wi...   01/30/2015 - 01/31/2015 Hospital Admission SBO (small bowel obstruction)   03/26/2015 Treatment  Plan Change Xeloda cost-prohibitive without financial help         I personally reviewed and went over laboratory results with the patient.  The results are noted within this dictation.  Past Medical History  Diagnosis Date  . 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     has No Known Allergies.  We administered Influenza vac split quadrivalent PF, heparin lock flush, and sodium chloride.  Past Surgical History  Procedure Laterality Date  . Esophagogastroduodenoscopy N/A 07/30/2014    Procedure: ESOPHAGOGASTRODUODENOSCOPY (EGD);  Surgeon: Inda Castle, MD;  Location: [facility];  Service: Endoscopy;  Laterality: N/A;  . Colonoscopy N/A 07/30/2014    Procedure: COLONOSCOPY;  Surgeon: Inda Castle, MD;  Location: [facility];  Service: Endoscopy;  Laterality: N/A;  . Portacath placement Right 08/01/14  . Portacath placement N/A 08/01/2014    Procedure: INSERTION RIGHT IJ PORT-A-CATH WITH ULTRASOUND AND FLUORO;  Surgeon: Gayland Curry, MD;  Location: [facility]S;  Service: General;  Laterality: N/A;  . Operative ultrasound N/A 08/01/2014    Procedure: OPERATIVE ULTRASOUND;  Surgeon: Gayland Curry, MD;  Location: [facility];  Service: General;  Laterality: N/A;    Denies any headaches, dizziness, double vision, fevers, chills, night sweats, nausea, vomiting, diarrhea, chest pain, heart palpitations, shortness of breath, blood in stool, black tarry stool, urinary pain, urinary burning, urinary frequency, hematuria. Positive for back pain.     Managed with  oxycodone Positive for tingling.     Neuropathy in fingertips and bottom of feet Positive for itching Left leg itching every once in a while. Puts cream on it.  14 point review of systems was performed and is negative except as detailed under history of present illness and above   PHYSICAL EXAMINATION Vitals with BMI 09/16/2015  Height   Weight 218 lbs 11 oz  BMI   Systolic 850  Diastolic 77  Pulse 66  Respirations 18   ATUS: 0 - Asymptomatic  GENERAL:alert, no distress, well nourished, well developed, comfortable, cooperative and smiling SKIN: skin color, texture, turgor are normal, raised erythematous rash on the anterior LLE covering area approx 6 cm in diameter. HEAD: Normocephalic, No masses, lesions, tenderness or abnormalities EYES: normal, PERRLA, EOMI, Conjunctiva are pink and non-injected EARS: External ears normal OROPHARYNX:lips, buccal mucosa, and tongue normal and mucous membranes are moist  NECK: supple, trachea midline LYMPH:  No palpable adenopathy in the neck or supraclavicular regions, no axillary adenopathy BREAST:not examined LUNGS: Air to auscultation bilaterally with no wheezing or rhonchi HEART: S1 and S2 audible, regular, no ectopy ABDOMEN:abdomen soft and normal bowel sounds. No palpable liver edge. Ostomy site intact BACK: Back symmetric, no curvature. EXTREMITIES:less then 2 second  capillary refill, no joint deformities, effusion, or inflammation, no cyanosis NEURO: alert & oriented x 3 with fluent speech, no focal motor/sensory deficits, gait normal   LABORATORY DATA: I have reviewed the data below as listed.  Results for ALLEN, EGERTON (MRN [id number]) as of 09/16/2015 11:29  Ref. Range 09/16/2015 10:45  Sodium Latest Ref Range: 135-145 mmol/L 143  Potassium Latest Ref Range: 3.5-5.1 mmol/L 4.4  Chloride Latest Ref Range: 101-111 mmol/L 110  CO2 Latest Ref Range: 22-32 mmol/L 23  BUN Latest Ref Range: 6-20 mg/dL 27 (H)  Creatinine Latest  Ref Range: 0.61-1.24 mg/dL 1.03  Calcium Latest Ref Range: 8.9-10.3 mg/dL 8.8 (L)  EGFR (Non-African Amer.) Latest Ref Range: >60 mL/min >60  EGFR (African American) Latest Ref Range: >60 mL/min >60  Glucose Latest Ref Range: 65-99 mg/dL 119 (H)  Anion gap Latest Ref Range: 5-15  10  Alkaline Phosphatase Latest Ref Range: 38-126 U/L 76  Albumin Latest Ref Range: 3.5-5.0 g/dL 4.0  AST Latest Ref Range: 15-41 U/L 23  ALT Latest Ref Range: 17-63 U/L 20  Total Protein Latest Ref Range: 6.5-8.1 g/dL 7.0  Total Bilirubin Latest Ref Range: 0.3-1.2 mg/dL 0.4  WBC Latest Ref Range: 4.0-10.5 K/uL 5.6  RBC Latest Ref Range: 4.22-5.81 MIL/uL 4.57  Hemoglobin Latest Ref Range: 13.0-17.0 g/dL 13.6  HCT Latest Ref Range: 39.0-52.0 % 39.8  MCV Latest Ref Range: 78.0-100.0 fL 87.1  MCH Latest Ref Range: 26.0-34.0 pg 29.8  MCHC Latest Ref Range: 30.0-36.0 g/dL 34.2  RDW Latest Ref Range: 11.5-15.5 % 14.8  Platelets Latest Ref Range: 150-400 K/uL 210  Neutrophils Latest Units: % 49  Lymphocytes Latest Units: % 38  Monocytes Relative Latest Units: % 9  Eosinophil Latest Units: % 3  Basophil Latest Units: % 1  NEUT# Latest Ref Range: 1.7-7.7 K/uL 2.8  Lymphocyte # Latest Ref Range: 0.7-4.0 K/uL 2.1  Monocyte # Latest Ref Range: 0.1-1.0 K/uL 0.5  Eosinophils Absolute Latest Ref Range: 0.0-0.7 K/uL 0.2  Basophils Absolute Latest Ref Range: 0.0-0.1 K/uL 0.0    Results for AMADU, SCHLAGETER (MRN [id number]) as of 09/16/2015 15:13  Ref. Range 04/15/2015 08:50 05/28/2015 10:25 06/25/2015 09:58 07/22/2015 09:40 08/19/2015 10:14  CEA Latest Ref Range: 0.0-4.7 ng/mL 2.7 2.2 1.4 1.8 1.7     RADIOLOGY: I have personally reviewed the radiological images as listed and agreed with the findings in the report. Study Result     CLINICAL DATA: Adenocarcinoma of the colon with liver metastasis diagnosed 2/16. Status post chemotherapy.  EXAM: CT CHEST, ABDOMEN, AND PELVIS WITH  CONTRAST  TECHNIQUE: Multidetector CT imaging of the chest, abdomen and pelvis was performed following the standard protocol during bolus administration of intravenous contrast.  CONTRAST: 184m OMNIPAQUE IOHEXOL 300 MG/ML SOLN  COMPARISON: 01/30/2015 abdominal pelvic CT. 10/22/2014 chest CT.  FINDINGS: CT CHEST FINDINGS  Mediastinum/Nodes: No supraclavicular adenopathy. A right Port-A-Cath which terminates at the high right atrium. Bovine arch. Aortic and branch vessel atherosclerosis. Normal heart size, without pericardial effusion. LAD coronary artery atherosclerosis. No central pulmonary embolism, on this non-dedicated study.  Low right paratracheal node measures 5 mm on image 23/series 2 versus 6 mm on the prior.  Subcarinal node measures 5 mm on image 29/series 2 versus 9 mm on the prior. No hilar adenopathy.  Lungs/Pleura: No pleural fluid. Previously described right sided pulmonary nodules are not identified.  A 2 mm left upper lobe pulmonary nodule on image 23/series 6 is nonspecific and not readily apparent on the prior. No lobar  consolidation.  Musculoskeletal: No acute osseous abnormality.  CT ABDOMEN PELVIS FINDINGS  Hepatobiliary: Hepatic metastasis. Index segment 8 lesion measures 2.9 x 2.2 cm on image 51/ series 2. 4.0 x 4.8 cm on 01/30/2015.  Index segment 4B lesion measures 2.1 x 2.2 cm on image 61/series 2. Compare 3.2 x 3.8 cm on the prior.  Normal gallbladder, without biliary ductal dilatation.  Pancreas: Normal, without mass or ductal dilatation.  Spleen: Normal in size, without focal abnormality.  Adrenals/Urinary Tract: Normal adrenal glands. Left renal cysts and too small to characterize lesions. Normal right kidney, without hydronephrosis. Normal urinary bladder.  Stomach/Bowel: Normal stomach, without wall thickening. Apparent stricturing at the mid sigmoid, including on image 97/ series 2. Similar. No  well-defined mass in this area. Large stool burden just proximal of this, without high-grade obstruction. Normal terminal ileum and appendix.  The mid to distal ileum is intimately associated with this area on image 100/series 2. Likely adherent. This is the site of bowel obstruction on 01/30/2015. No evidence of residual or recurrent obstruction. Remainder of small bowel unremarkable.  Vascular/Lymphatic: No retroperitoneal or retrocrural adenopathy. No pelvic sidewall adenopathy. Nodes in the left side of the high mesorectum measure up to 8 mm on image 99/series 2. 7 mm on the prior.  Reproductive: Normal prostate.  Other: No significant free fluid. Tiny fat containing bilateral inguinal hernias. No evidence of omental or peritoneal disease.  Musculoskeletal: Spondylosis and degenerative disc disease throughout the lumbosacral spine.  IMPRESSION: CT CHEST IMPRESSION  1. Resolution right-sided pulmonary nodules and decrease in size of small mediastinal nodes. 2. A left upper lobe pulmonary nodule is indeterminate and not readily apparent on the prior exam. This may represent a subpleural lymph node. Recommend attention on follow-up. Age advanced coronary artery atherosclerosis. Recommend assessment of coronary risk factors and consideration of medical therapy.  CT ABDOMEN AND PELVIS IMPRESSION  1. Response to therapy of hepatic metastasis. 2. Stricture at the mid sigmoid colon, the site of prior malignancy. No well-defined residual soft tissue mass is seen. Large stool burden proximal to this site suggests constipation and perhaps a component of subclinical obstruction. No high-grade obstruction seen. 3. Small bowel again contacting this point of the sigmoid, likely adherent. Resolution of small bowel obstruction. 4. Similar to slight increase in suspicious nodes in the high mesorectum.   Electronically Signed  By: Abigail Miyamoto M.D.  On: 07/03/2015 13:41      ASSESSMENT AND PLAN:  Stage IV colon cancer, KRAS WT  50 year old male with stage IV colon cancer he has had a significant improvement in his disease and clinical status.He is currently on single agent 5-FU with excellent tolerance. His PS is excellent. CEA is WNL.  Unfortunately AVASTIN had to be discontinued secondary to persistent proteinuria.    Plan is for ongoing therapy with 5-FU.  Genetics Counseling  He has completed genetics counseling. His genetic test results were normal--i.e. testing found no meaningful changes to any of 19 genes associated with increased risks for colorectal and other types of cancers.  Nephrotic Range Proteinuria  He has been off of Avastin which is most commonly implicated in proteinuria since May 2. He has been seen by nephrology.   Back Pain Described as intermittent and felt to be activity related.  He will contact us if his back pain worsens.  LLE RASH  I will write him a prescription for Triamcinolone cream. He was advised to call if it worsens or does not improve.   He does not  need any prescription refills.  He will return for a follow up in a month.   All questions were answered. The patient knows to call the clinic with any problems, questions or concerns. We can certainly see the patient much sooner if necessary.   This document serves as a record of services personally performed by Ancil Linsey, MD. It was created on her behalf by Kandace Blitz, a trained medical scribe. The creation of this record is based on the scribe's personal observations and the provider's statements to them. This document has been checked and approved by the attending provider.  I have reviewed the above documentation for accuracy and completeness, and I agree with the above.  This note was electronically signed.  Kelby Fam. Whitney Muse, MD

## 2015-09-17 ENCOUNTER — Encounter (HOSPITAL_BASED_OUTPATIENT_CLINIC_OR_DEPARTMENT_OTHER): Payer: Medicaid Other

## 2015-09-17 VITALS — BP 101/58 | HR 64 | Temp 97.9°F | Resp 16

## 2015-09-17 DIAGNOSIS — C787 Secondary malignant neoplasm of liver and intrahepatic bile duct: Secondary | ICD-10-CM

## 2015-09-17 DIAGNOSIS — C189 Malignant neoplasm of colon, unspecified: Secondary | ICD-10-CM | POA: Diagnosis not present

## 2015-09-17 DIAGNOSIS — Z5111 Encounter for antineoplastic chemotherapy: Secondary | ICD-10-CM

## 2015-09-17 LAB — CEA: CEA: 2 ng/mL (ref 0.0–4.7)

## 2015-09-17 MED ORDER — PROCHLORPERAZINE MALEATE 10 MG PO TABS
ORAL_TABLET | ORAL | Status: AC
  Filled 2015-09-17: qty 1

## 2015-09-17 MED ORDER — HEPARIN SOD (PORK) LOCK FLUSH 100 UNIT/ML IV SOLN
500.0000 [IU] | Freq: Once | INTRAVENOUS | Status: AC | PRN
  Administered 2015-09-17: 500 [IU]

## 2015-09-17 MED ORDER — SODIUM CHLORIDE 0.9 % IV SOLN
340.0000 mg/m2 | Freq: Once | INTRAVENOUS | Status: AC
  Administered 2015-09-17: 700 mg via INTRAVENOUS
  Filled 2015-09-17: qty 14

## 2015-09-17 MED ORDER — SODIUM CHLORIDE 0.9 % IV SOLN
Freq: Once | INTRAVENOUS | Status: AC
  Administered 2015-09-17: 11:00:00 via INTRAVENOUS

## 2015-09-17 MED ORDER — PROCHLORPERAZINE MALEATE 10 MG PO TABS
10.0000 mg | ORAL_TABLET | Freq: Once | ORAL | Status: AC
  Administered 2015-09-17: 10 mg via ORAL

## 2015-09-17 MED ORDER — SODIUM CHLORIDE 0.9 % IJ SOLN
10.0000 mL | INTRAMUSCULAR | Status: DC | PRN
  Administered 2015-09-17: 10 mL
  Filled 2015-09-17: qty 10

## 2015-09-17 MED ORDER — SODIUM CHLORIDE 0.9 % IV SOLN
8.0000 mg | Freq: Once | INTRAVENOUS | Status: AC
  Administered 2015-09-17: 8 mg via INTRAVENOUS
  Filled 2015-09-17: qty 4

## 2015-09-17 MED ORDER — HEPARIN SOD (PORK) LOCK FLUSH 100 UNIT/ML IV SOLN
INTRAVENOUS | Status: AC
  Filled 2015-09-17: qty 5

## 2015-09-17 MED ORDER — LEUCOVORIN CALCIUM INJECTION 100 MG
20.0000 mg/m2 | Freq: Once | INTRAMUSCULAR | Status: AC
  Administered 2015-09-17: 42 mg via INTRAVENOUS
  Filled 2015-09-17: qty 2.1

## 2015-09-17 NOTE — Patient Instructions (Signed)
[facility] Discharge Instructions for Patients Receiving Chemotherapy   Beginning January 23rd 2017 lab work for the [facility] will be done in the  Main lab at [facility] on 1st floor. If you have a lab appointment with the [facility] please come in thru the  Main Entrance and check in at the main information desk   Today you received the following chemotherapy agents leucovorin and 5FU Day 2 of 5.  To help prevent nausea and vomiting after your treatment, we encourage you to take your nausea medication as instructed.   If you develop nausea and vomiting, or diarrhea that is not controlled by your medication, call the clinic.  The clinic phone number is [phone]. Office hours are Monday-Friday 8:30am-5:00pm.  BELOW ARE SYMPTOMS THAT SHOULD BE REPORTED IMMEDIATELY:  *FEVER GREATER THAN 101.0 F  *CHILLS WITH OR WITHOUT FEVER  NAUSEA AND VOMITING THAT IS NOT CONTROLLED WITH YOUR NAUSEA MEDICATION  *UNUSUAL SHORTNESS OF BREATH  *UNUSUAL BRUISING OR BLEEDING  TENDERNESS IN MOUTH AND THROAT WITH OR WITHOUT PRESENCE OF ULCERS  *URINARY PROBLEMS  *BOWEL PROBLEMS  UNUSUAL RASH Items with * indicate a potential emergency and should be followed up as soon as possible. If you have an emergency after office hours please contact your primary care physician or go to the nearest emergency department.  Please call the clinic during office hours if you have any questions or concerns.   You may also contact the Patient Navigator at [phone] should you have any questions or need assistance in obtaining follow up care.  Resources For Cancer Patients and their Caregivers ? American Cancer Society: Can assist with transportation, wigs, general needs, runs Look Good Feel Better.        661-335-0824 ? Cancer Care: Provides financial assistance, online support groups, medication/co-pay assistance.  1-800-813-HOPE 5138073651) ? [place] Assists [place] cancer patients and their families through emotional , educational and financial support.  [phone] ? [place] DSS Where to apply for food stamps, Medicaid and utility assistance. 260-447-8668 ? [employer]: Transportation to medical appointments. 6028447609 ? Social Security Administration: May apply for disability if have a Stage IV cancer. [phone] ? [employer], Disability and Transit Services: Assists with nutrition, care and transit needs. [phone]

## 2015-09-17 NOTE — Progress Notes (Signed)
Denies any complaints post chemo yesterday.  Tolerated chemo well today. Ambulatory on discharge home to self.

## 2015-09-18 ENCOUNTER — Encounter (HOSPITAL_BASED_OUTPATIENT_CLINIC_OR_DEPARTMENT_OTHER): Payer: Medicaid Other

## 2015-09-18 VITALS — BP 98/67 | HR 60 | Temp 97.8°F | Resp 18

## 2015-09-18 DIAGNOSIS — C787 Secondary malignant neoplasm of liver and intrahepatic bile duct: Secondary | ICD-10-CM | POA: Diagnosis not present

## 2015-09-18 DIAGNOSIS — Z5111 Encounter for antineoplastic chemotherapy: Secondary | ICD-10-CM | POA: Diagnosis not present

## 2015-09-18 DIAGNOSIS — C189 Malignant neoplasm of colon, unspecified: Secondary | ICD-10-CM | POA: Diagnosis not present

## 2015-09-18 MED ORDER — HEPARIN SOD (PORK) LOCK FLUSH 100 UNIT/ML IV SOLN
500.0000 [IU] | Freq: Once | INTRAVENOUS | Status: AC | PRN
  Administered 2015-09-18: 500 [IU]

## 2015-09-18 MED ORDER — SODIUM CHLORIDE 0.9 % IJ SOLN
10.0000 mL | INTRAMUSCULAR | Status: DC | PRN
  Administered 2015-09-18: 10 mL
  Filled 2015-09-18: qty 10

## 2015-09-18 MED ORDER — SODIUM CHLORIDE 0.9 % IV SOLN
Freq: Once | INTRAVENOUS | Status: AC
  Administered 2015-09-18: 11:00:00 via INTRAVENOUS

## 2015-09-18 MED ORDER — LEUCOVORIN CALCIUM INJECTION 100 MG
20.0000 mg/m2 | Freq: Once | INTRAMUSCULAR | Status: AC
  Administered 2015-09-18: 42 mg via INTRAVENOUS
  Filled 2015-09-18: qty 2.1

## 2015-09-18 MED ORDER — SODIUM CHLORIDE 0.9 % IV SOLN
340.0000 mg/m2 | Freq: Once | INTRAVENOUS | Status: AC
  Administered 2015-09-18: 700 mg via INTRAVENOUS
  Filled 2015-09-18: qty 14

## 2015-09-18 MED ORDER — PROCHLORPERAZINE MALEATE 10 MG PO TABS
ORAL_TABLET | ORAL | Status: AC
  Filled 2015-09-18: qty 1

## 2015-09-18 MED ORDER — HEPARIN SOD (PORK) LOCK FLUSH 100 UNIT/ML IV SOLN
INTRAVENOUS | Status: AC
  Filled 2015-09-18: qty 5

## 2015-09-18 MED ORDER — SODIUM CHLORIDE 0.9 % IV SOLN
8.0000 mg | Freq: Once | INTRAVENOUS | Status: AC
  Administered 2015-09-18: 8 mg via INTRAVENOUS
  Filled 2015-09-18: qty 4

## 2015-09-18 MED ORDER — PROCHLORPERAZINE MALEATE 10 MG PO TABS
10.0000 mg | ORAL_TABLET | Freq: Once | ORAL | Status: AC
  Administered 2015-09-18: 10 mg via ORAL

## 2015-09-18 NOTE — Patient Instructions (Signed)
[facility] Discharge Instructions for Patients Receiving Chemotherapy   Beginning January 23rd 2017 lab work for the [facility] will be done in the  Main lab at [facility] on 1st floor. If you have a lab appointment with the [place] please come in thru the  Main Entrance and check in at the main information desk   Today you received the following chemotherapy agents leucovorin and 90fu Day 3 of 5.  To help prevent nausea and vomiting after your treatment, we encourage you to take your nausea medication as instructed.   If you develop nausea and vomiting, or diarrhea that is not controlled by your medication, call the clinic.  The clinic phone number is [phone]. Office hours are Monday-Friday 8:30am-5:00pm.  BELOW ARE SYMPTOMS THAT SHOULD BE REPORTED IMMEDIATELY:  *FEVER GREATER THAN 101.0 F  *CHILLS WITH OR WITHOUT FEVER  NAUSEA AND VOMITING THAT IS NOT CONTROLLED WITH YOUR NAUSEA MEDICATION  *UNUSUAL SHORTNESS OF BREATH  *UNUSUAL BRUISING OR BLEEDING  TENDERNESS IN MOUTH AND THROAT WITH OR WITHOUT PRESENCE OF ULCERS  *URINARY PROBLEMS  *BOWEL PROBLEMS  UNUSUAL RASH Items with * indicate a potential emergency and should be followed up as soon as possible. If you have an emergency after office hours please contact your primary care physician or go to the nearest emergency department.  Please call the clinic during office hours if you have any questions or concerns.   You may also contact the Patient Navigator at [phone] should you have any questions or need assistance in obtaining follow up care.  Resources For Cancer Patients and their Caregivers ? American Cancer Society: Can assist with transportation, wigs, general needs, runs Look Good Feel Better.        706-080-9387 ? Cancer Care: Provides financial assistance, online support groups, medication/co-pay assistance.  1-800-813-HOPE 325-771-5824) ? [place] Assists [place] cancer patients and their families through emotional , educational and financial support.  ([phone] ? [place] DSS Where to apply for food stamps, Medicaid and utility assistance. [phone] [phone] ? [employer]: Transportation to medical appointments. 865-494-7618 ? Social Security Administration: May apply for disability if have a Stage IV cancer. [phone] ? [employer], Disability and Transit Services: Assists with nutrition, care and transit needs. [phone]

## 2015-09-18 NOTE — Progress Notes (Signed)
Tolerated chemo well. Ambulatory on discharge home to self. 

## 2015-09-19 ENCOUNTER — Encounter (HOSPITAL_BASED_OUTPATIENT_CLINIC_OR_DEPARTMENT_OTHER): Payer: Medicaid Other

## 2015-09-19 VITALS — BP 109/65 | HR 60 | Temp 97.1°F | Resp 16

## 2015-09-19 DIAGNOSIS — Z5111 Encounter for antineoplastic chemotherapy: Secondary | ICD-10-CM | POA: Diagnosis not present

## 2015-09-19 DIAGNOSIS — C189 Malignant neoplasm of colon, unspecified: Secondary | ICD-10-CM | POA: Diagnosis not present

## 2015-09-19 DIAGNOSIS — C787 Secondary malignant neoplasm of liver and intrahepatic bile duct: Secondary | ICD-10-CM | POA: Diagnosis not present

## 2015-09-19 MED ORDER — PROCHLORPERAZINE MALEATE 10 MG PO TABS
ORAL_TABLET | ORAL | Status: AC
  Filled 2015-09-19: qty 1

## 2015-09-19 MED ORDER — SODIUM CHLORIDE 0.9 % IV SOLN
8.0000 mg | Freq: Once | INTRAVENOUS | Status: AC
  Administered 2015-09-19: 8 mg via INTRAVENOUS
  Filled 2015-09-19: qty 4

## 2015-09-19 MED ORDER — PROCHLORPERAZINE MALEATE 10 MG PO TABS
10.0000 mg | ORAL_TABLET | Freq: Once | ORAL | Status: AC
  Administered 2015-09-19: 10 mg via ORAL

## 2015-09-19 MED ORDER — HEPARIN SOD (PORK) LOCK FLUSH 100 UNIT/ML IV SOLN
500.0000 [IU] | Freq: Once | INTRAVENOUS | Status: AC | PRN
  Administered 2015-09-19: 500 [IU]

## 2015-09-19 MED ORDER — LEUCOVORIN CALCIUM INJECTION 100 MG
20.0000 mg/m2 | Freq: Once | INTRAMUSCULAR | Status: AC
Start: 2015-09-19 — End: 2015-09-19
  Administered 2015-09-19: 42 mg via INTRAVENOUS
  Filled 2015-09-19: qty 2.1

## 2015-09-19 MED ORDER — FLUOROURACIL CHEMO INJECTION 2.5 GM/50ML
340.0000 mg/m2 | Freq: Once | INTRAVENOUS | Status: AC
  Administered 2015-09-19: 700 mg via INTRAVENOUS
  Filled 2015-09-19: qty 14

## 2015-09-19 MED ORDER — SODIUM CHLORIDE 0.9 % IV SOLN
Freq: Once | INTRAVENOUS | Status: AC
  Administered 2015-09-19: 11:00:00 via INTRAVENOUS

## 2015-09-19 MED ORDER — HEPARIN SOD (PORK) LOCK FLUSH 100 UNIT/ML IV SOLN
INTRAVENOUS | Status: AC
  Filled 2015-09-19: qty 5

## 2015-09-19 MED ORDER — SODIUM CHLORIDE 0.9 % IJ SOLN
10.0000 mL | INTRAMUSCULAR | Status: DC | PRN
  Administered 2015-09-19: 10 mL
  Filled 2015-09-19: qty 10

## 2015-09-19 NOTE — Progress Notes (Signed)
Tolerated chemo well. Ambulatory on discharge home to self. 

## 2015-09-19 NOTE — Patient Instructions (Signed)
[facility] Discharge Instructions for Patients Receiving Chemotherapy   Beginning January 23rd 2017 lab work for the [facility] will be done in the  Main lab at [facility] on 1st floor. If you have a lab appointment with the [place] please come in thru the  Main Entrance and check in at the main information desk   Today you received the following chemotherapy agents leucovorin and 26fu Day 4 of 5.  If you develop nausea and vomiting, or diarrhea that is not controlled by your medication, call the clinic.  The clinic phone number is [phone]. Office hours are Monday-Friday 8:30am-5:00pm.  BELOW ARE SYMPTOMS THAT SHOULD BE REPORTED IMMEDIATELY:  *FEVER GREATER THAN 101.0 F  *CHILLS WITH OR WITHOUT FEVER  NAUSEA AND VOMITING THAT IS NOT CONTROLLED WITH YOUR NAUSEA MEDICATION  *UNUSUAL SHORTNESS OF BREATH  *UNUSUAL BRUISING OR BLEEDING  TENDERNESS IN MOUTH AND THROAT WITH OR WITHOUT PRESENCE OF ULCERS  *URINARY PROBLEMS  *BOWEL PROBLEMS  UNUSUAL RASH Items with * indicate a potential emergency and should be followed up as soon as possible. If you have an emergency after office hours please contact your primary care physician or go to the nearest emergency department.  Please call the clinic during office hours if you have any questions or concerns.   You may also contact the Patient Navigator at [phone] should you have any questions or need assistance in obtaining follow up care.  Resources For Cancer Patients and their Caregivers ? American Cancer Society: Can assist with transportation, wigs, general needs, runs Look Good Feel Better.        910-019-3278 ? Cancer Care: Provides financial assistance, online support groups, medication/co-pay assistance.  1-800-813-HOPE (203)347-3447) ? [place] Assists [place] cancer patients and their families through emotional , educational and financial support.   5805555315 ? [place] DSS Where to apply for food stamps, Medicaid and utility assistance. 314-369-2251 ? [employer]: Transportation to medical appointments. 754-849-0802 ? Social Security Administration: May apply for disability if have a Stage IV cancer. [phone] ? [employer], Disability and Transit Services: Assists with nutrition, care and transit needs. [phone]

## 2015-09-20 ENCOUNTER — Encounter (HOSPITAL_BASED_OUTPATIENT_CLINIC_OR_DEPARTMENT_OTHER): Payer: Medicaid Other

## 2015-09-20 ENCOUNTER — Inpatient Hospital Stay (HOSPITAL_COMMUNITY): Payer: Medicaid Other

## 2015-09-20 VITALS — BP 104/72 | HR 61 | Temp 98.0°F | Resp 16

## 2015-09-20 DIAGNOSIS — Z5111 Encounter for antineoplastic chemotherapy: Secondary | ICD-10-CM | POA: Diagnosis not present

## 2015-09-20 DIAGNOSIS — C787 Secondary malignant neoplasm of liver and intrahepatic bile duct: Secondary | ICD-10-CM | POA: Diagnosis not present

## 2015-09-20 DIAGNOSIS — C189 Malignant neoplasm of colon, unspecified: Secondary | ICD-10-CM | POA: Diagnosis not present

## 2015-09-20 MED ORDER — LEUCOVORIN CALCIUM INJECTION 100 MG
20.0000 mg/m2 | Freq: Once | INTRAMUSCULAR | Status: AC
  Administered 2015-09-20: 42 mg via INTRAVENOUS
  Filled 2015-09-20: qty 2.1

## 2015-09-20 MED ORDER — SODIUM CHLORIDE 0.9 % IV SOLN
Freq: Once | INTRAVENOUS | Status: AC
Start: 2015-09-20 — End: 2015-09-20
  Administered 2015-09-20: 09:00:00 via INTRAVENOUS

## 2015-09-20 MED ORDER — HEPARIN SOD (PORK) LOCK FLUSH 100 UNIT/ML IV SOLN
INTRAVENOUS | Status: AC
  Filled 2015-09-20: qty 5

## 2015-09-20 MED ORDER — PROCHLORPERAZINE MALEATE 10 MG PO TABS
ORAL_TABLET | ORAL | Status: AC
  Filled 2015-09-20: qty 1

## 2015-09-20 MED ORDER — PROCHLORPERAZINE MALEATE 10 MG PO TABS
10.0000 mg | ORAL_TABLET | Freq: Once | ORAL | Status: AC
Start: 2015-09-20 — End: 2015-09-20
  Administered 2015-09-20: 10 mg via ORAL

## 2015-09-20 MED ORDER — HEPARIN SOD (PORK) LOCK FLUSH 100 UNIT/ML IV SOLN
500.0000 [IU] | Freq: Once | INTRAVENOUS | Status: AC | PRN
  Administered 2015-09-20: 500 [IU]

## 2015-09-20 MED ORDER — SODIUM CHLORIDE 0.9 % IV SOLN
8.0000 mg | Freq: Once | INTRAVENOUS | Status: AC
  Administered 2015-09-20: 8 mg via INTRAVENOUS
  Filled 2015-09-20: qty 4

## 2015-09-20 MED ORDER — SODIUM CHLORIDE 0.9 % IJ SOLN: 10.0000 mL | INTRAMUSCULAR | Status: DC | PRN

## 2015-09-20 MED ORDER — SODIUM CHLORIDE 0.9 % IV SOLN
340.0000 mg/m2 | Freq: Once | INTRAVENOUS | Status: AC
  Administered 2015-09-20: 700 mg via INTRAVENOUS
  Filled 2015-09-20: qty 14

## 2015-09-20 NOTE — Patient Instructions (Signed)
[facility] Discharge Instructions for Patients Receiving Chemotherapy   Beginning January 23rd 2017 lab work for the [facility] will be done in the  Main lab at [facility] on 1st floor. If you have a lab appointment with the [place] please come in thru the  Main Entrance and check in at the main information desk   Today you received the following chemotherapy agent: Leucovorin and fluorouracil.     If you develop nausea and vomiting, or diarrhea that is not controlled by your medication, call the clinic.  The clinic phone number is [phone]. Office hours are Monday-Friday 8:30am-5:00pm.  BELOW ARE SYMPTOMS THAT SHOULD BE REPORTED IMMEDIATELY:  *FEVER GREATER THAN 101.0 F  *CHILLS WITH OR WITHOUT FEVER  NAUSEA AND VOMITING THAT IS NOT CONTROLLED WITH YOUR NAUSEA MEDICATION  *UNUSUAL SHORTNESS OF BREATH  *UNUSUAL BRUISING OR BLEEDING  TENDERNESS IN MOUTH AND THROAT WITH OR WITHOUT PRESENCE OF ULCERS  *URINARY PROBLEMS  *BOWEL PROBLEMS  UNUSUAL RASH Items with * indicate a potential emergency and should be followed up as soon as possible. If you have an emergency after office hours please contact your primary care physician or go to the nearest emergency department.  Please call the clinic during office hours if you have any questions or concerns.   You may also contact the Patient Navigator at [phone] should you have any questions or need assistance in obtaining follow up care.      Resources For Cancer Patients and their Caregivers ? American Cancer Society: Can assist with transportation, wigs, general needs, runs Look Good Feel Better.        [phone] ? Cancer Care: Provides financial assistance, online support groups, medication/co-pay assistance.  1-800-813-HOPE 323-319-4423) ? [place] Assists [place] cancer patients and their families through emotional , educational and financial support.   929-362-7304 ? [place] DSS Where to apply for food stamps, Medicaid and utility assistance. [phone] ? [employer]: Transportation to medical appointments. [phone] ? Social Security Administration: May apply for disability if have a Stage IV cancer. [phone] [phone] ? [employer], Disability and Transit Services: Assists with nutrition, care and transit needs. [phone]

## 2015-09-20 NOTE — Progress Notes (Signed)
Patient tolerated infusion well.  VSS.  Ambulatory out of clinic.   

## 2015-10-14 ENCOUNTER — Encounter (HOSPITAL_COMMUNITY): Payer: Medicaid Other | Attending: Hematology & Oncology | Admitting: Hematology & Oncology

## 2015-10-14 ENCOUNTER — Encounter (HOSPITAL_BASED_OUTPATIENT_CLINIC_OR_DEPARTMENT_OTHER): Payer: Medicaid Other

## 2015-10-14 ENCOUNTER — Encounter (HOSPITAL_COMMUNITY): Payer: Self-pay | Admitting: Hematology & Oncology

## 2015-10-14 VITALS — BP 129/80 | HR 88 | Temp 98.2°F | Resp 18

## 2015-10-14 VITALS — BP 129/86 | HR 63 | Temp 98.0°F | Resp 16 | Wt 223.0 lb

## 2015-10-14 DIAGNOSIS — C7801 Secondary malignant neoplasm of right lung: Secondary | ICD-10-CM

## 2015-10-14 DIAGNOSIS — R21 Rash and other nonspecific skin eruption: Secondary | ICD-10-CM

## 2015-10-14 DIAGNOSIS — R809 Proteinuria, unspecified: Secondary | ICD-10-CM

## 2015-10-14 DIAGNOSIS — C787 Secondary malignant neoplasm of liver and intrahepatic bile duct: Secondary | ICD-10-CM | POA: Insufficient documentation

## 2015-10-14 DIAGNOSIS — Z5111 Encounter for antineoplastic chemotherapy: Secondary | ICD-10-CM

## 2015-10-14 DIAGNOSIS — C7802 Secondary malignant neoplasm of left lung: Secondary | ICD-10-CM

## 2015-10-14 DIAGNOSIS — C189 Malignant neoplasm of colon, unspecified: Secondary | ICD-10-CM

## 2015-10-14 DIAGNOSIS — C187 Malignant neoplasm of sigmoid colon: Secondary | ICD-10-CM | POA: Diagnosis not present

## 2015-10-14 DIAGNOSIS — M545 Low back pain, unspecified: Secondary | ICD-10-CM

## 2015-10-14 LAB — CBC WITH DIFFERENTIAL/PLATELET
BASOS ABS: 0 10*3/uL (ref 0.0–0.1)
Basophils Relative: 0 %
EOS PCT: 3 %
Eosinophils Absolute: 0.2 10*3/uL (ref 0.0–0.7)
HEMATOCRIT: 38.9 % — AB (ref 39.0–52.0)
Hemoglobin: 13.4 g/dL (ref 13.0–17.0)
Lymphocytes Relative: 31 %
Lymphs Abs: 2.3 10*3/uL (ref 0.7–4.0)
MCH: 30.1 pg (ref 26.0–34.0)
MCHC: 34.4 g/dL (ref 30.0–36.0)
MCV: 87.4 fL (ref 78.0–100.0)
MONO ABS: 0.7 10*3/uL (ref 0.1–1.0)
MONOS PCT: 10 %
NEUTROS PCT: 56 %
Neutro Abs: 4.2 10*3/uL (ref 1.7–7.7)
PLATELETS: 215 10*3/uL (ref 150–400)
RBC: 4.45 MIL/uL (ref 4.22–5.81)
RDW: 14.5 % (ref 11.5–15.5)
WBC: 7.4 10*3/uL (ref 4.0–10.5)

## 2015-10-14 LAB — COMPREHENSIVE METABOLIC PANEL
ALT: 25 U/L (ref 17–63)
AST: 24 U/L (ref 15–41)
Albumin: 3.9 g/dL (ref 3.5–5.0)
Alkaline Phosphatase: 83 U/L (ref 38–126)
Anion gap: 7 (ref 5–15)
BILIRUBIN TOTAL: 0.4 mg/dL (ref 0.3–1.2)
BUN: 20 mg/dL (ref 6–20)
CHLORIDE: 103 mmol/L (ref 101–111)
CO2: 26 mmol/L (ref 22–32)
CREATININE: 0.88 mg/dL (ref 0.61–1.24)
Calcium: 8.9 mg/dL (ref 8.9–10.3)
Glucose, Bld: 89 mg/dL (ref 65–99)
POTASSIUM: 4 mmol/L (ref 3.5–5.1)
Sodium: 136 mmol/L (ref 135–145)
TOTAL PROTEIN: 7 g/dL (ref 6.5–8.1)

## 2015-10-14 MED ORDER — SODIUM CHLORIDE 0.9 % IV SOLN
8.0000 mg | Freq: Once | INTRAVENOUS | Status: AC
  Administered 2015-10-14: 8 mg via INTRAVENOUS
  Filled 2015-10-14: qty 4

## 2015-10-14 MED ORDER — OXYCODONE-ACETAMINOPHEN 5-325 MG PO TABS: 1.0000 | ORAL_TABLET | ORAL | Status: DC | PRN

## 2015-10-14 MED ORDER — LEUCOVORIN CALCIUM INJECTION 100 MG
20.0000 mg/m2 | Freq: Once | INTRAMUSCULAR | Status: AC
  Administered 2015-10-14: 42 mg via INTRAVENOUS
  Filled 2015-10-14: qty 2.1

## 2015-10-14 MED ORDER — HEPARIN SOD (PORK) LOCK FLUSH 100 UNIT/ML IV SOLN
500.0000 [IU] | Freq: Once | INTRAVENOUS | Status: AC | PRN
  Administered 2015-10-14: 500 [IU]
  Filled 2015-10-14: qty 5

## 2015-10-14 MED ORDER — DIAZEPAM 10 MG PO TABS: 10.0000 mg | ORAL_TABLET | Freq: Three times a day (TID) | ORAL | Status: AC | PRN

## 2015-10-14 MED ORDER — PROCHLORPERAZINE MALEATE 10 MG PO TABS
10.0000 mg | ORAL_TABLET | Freq: Once | ORAL | Status: AC
Start: 2015-10-14 — End: 2015-10-14
  Administered 2015-10-14: 10 mg via ORAL
  Filled 2015-10-14: qty 1

## 2015-10-14 MED ORDER — SODIUM CHLORIDE 0.9 % IV SOLN
Freq: Once | INTRAVENOUS | Status: AC
  Administered 2015-10-14: 11:00:00 via INTRAVENOUS

## 2015-10-14 MED ORDER — SODIUM CHLORIDE 0.9 % IV SOLN
340.0000 mg/m2 | Freq: Once | INTRAVENOUS | Status: AC
  Administered 2015-10-14: 700 mg via INTRAVENOUS
  Filled 2015-10-14: qty 14

## 2015-10-14 MED ORDER — SODIUM CHLORIDE 0.9 % IJ SOLN
10.0000 mL | INTRAMUSCULAR | Status: DC | PRN
  Administered 2015-10-14: 10 mL
  Filled 2015-10-14: qty 10

## 2015-10-14 NOTE — Progress Notes (Signed)
Tom [name] Tolerated chemotherapy well today Discharged ambulatory 

## 2015-10-14 NOTE — Progress Notes (Signed)
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 Appearance is most likely to represent diffuse hepatic metastasis   07/30/2014 Initial Diagnosis Colon, biopsy, sigmoid - ADENOCARCINOMA.   07/31/2014 Tumor Marker CEA- 48.0   07/31/2014 Imaging Ct abd/pelvis- concerning for primary colorectal neoplasm in the mid to distal sigmoid colon, with lymphadenopathy in the sigmoid mesocolon, ileocolic mesentery, and retroperitoneum, as well as widespread metastatic disease to the liver   08/01/2014 Imaging CT chest- Pathologic thoracic, right hilar, and infrahilar adenopathy associated with a masslike region of possible consolidation in the right middle lobe, surrounding nodularity, and some scattered bilateral pulmonary nodules.   08/06/2014 - 12/10/2014 Chemotherapy FOLFOX.  Avastin added for cycle 2.  S/P 10 cycles.  Change to maintenance therapy secondary to positive response to therapy, increasing PN, and progressive proteinuria.   10/08/2014 Adverse Reaction Avastin induced proteinuria >/= 2 gm Avastin on hold   10/15/2014 Treatment Plan Change Avastin held for proteinuria.   10/22/2014 Imaging Ct CAP- 1. Interval decrease and soft tissue adjacent to the mid sigmoid colon with decreasing sigmoid colon wall thickening. The  leocolic, mesenteric, perirectal, and mesocolonic lymphadenopathy has decreased in the interval.   10/30/2014 Survivorship Genetic Counseling. Genetic testing was normal, and did not reveal a deleterious mutation in these genes. The test report will be scanned into EPIC and will be located  under the Media tab.    11/26/2014 Treatment Plan Change Oxaliplatin reduced x 25% due to PN.   12/24/2014 Treatment Plan Change Change to maintanence therapy.   01/09/2015 - 03/26/2015 Chemotherapy Xeloda 2500 mg BID, 7 days on and 7 days off.   01/30/2015 Imaging CT in ED- Evidence of distal small bowel obstruction with transition point over the ileum in the right upper pelvis immediately adjacent/abutting the known malignant stricturing of the sigmoid colon. There is wall thickening of the sigmoid colon wi...   01/30/2015 - 01/31/2015 Hospital Admission SBO (small bowel obstruction)   03/26/2015 Treatment Plan Change Xeloda cost-prohibitive without financial help   04/15/2015 -  Chemotherapy 5FU/Leucovorin days 1-5 every 28 days   05/13/2015 Adverse Reaction Palmar-Plantar erythrodysesthesia   05/13/2015 Treatment Plan Change 5FU/Leucovorin held   05/28/2015 Treatment Plan Change 5FU dose reduced by 20%   07/03/2015 Imaging CT CAP- Resolution right-sided pulmonary nodules and decrease in size of small mediastinal nodes. Response to therapy of hepatic metastasis. Similar to slight increase in suspicious nodes in the high mesorectum.    CURRENT THERAPY:  5-FU  INTERVAL HISTORY: Tom [name] 50 y.o. 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 Last CT scans of the abdomen and pelvis were on 07/03/2015. Last CEA values have been WNL.    Tom [name] is unaccompanied. He is in a treatment chair during our visit.  He continues to experience lower back pain. This pain seems to subside as he moves throughout the day. He has been doing more yard work with the warmer weather. He does not like to take pain medication but needs to  to be able to sleep. He has been taking his wife's diazepam 10 mg. He took his prescribed oxycodone which did not seem to touch the pain. Denies leg weakness.   His appetite is good,  noting weight gain. His family is doing well and he finds things to smile about daily. His ostomy is good and he is urinating fine. He finds that he is not very thirsty unless he is very active and is curious if he should make himself drink more.   Denies abdominal pain, nausea, vomiting.   I personally reviewed and went over laboratory results with the patient.  The results are noted within this dictation.  Past Medical History  Diagnosis Date  . 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     has No Known Allergies.  We administered Influenza vac split quadrivalent PF, heparin lock flush, and sodium chloride.  Past Surgical History  Procedure Laterality Date  . Esophagogastroduodenoscopy N/A 07/30/2014    Procedure: ESOPHAGOGASTRODUODENOSCOPY (EGD);  Surgeon: Inda Castle, MD;  Location: [facility];  Service: Endoscopy;  Laterality: N/A;  . Colonoscopy N/A 07/30/2014    Procedure: COLONOSCOPY;  Surgeon: Inda Castle, MD;  Location: [facility];  Service: Endoscopy;  Laterality: N/A;  . Portacath placement Right 08/01/14  . Portacath placement N/A 08/01/2014    Procedure: INSERTION RIGHT IJ PORT-A-CATH WITH ULTRASOUND AND FLUORO;  Surgeon: Gayland Curry, MD;  Location: [facility]S;  Service: General;  Laterality: N/A;  . Operative ultrasound N/A 08/01/2014    Procedure: OPERATIVE ULTRASOUND;  Surgeon: Gayland Curry, MD;  Location: [facility];  Service: General;  Laterality: N/A;    Denies any headaches, dizziness, double vision, fevers, chills, night sweats, nausea,  vomiting, diarrhea, chest pain, heart palpitations, shortness of breath, blood in stool, black tarry stool, urinary pain, urinary burning, urinary frequency, hematuria. Positive for back pain.     Managed with diazepam. Positive for tingling.     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 Wears glasses. In treatment chair. SKIN: skin color, texture, turgor are normal, raised erythematous rash on the anterior LLE smaller than prior, but persistent HEAD: Normocephalic, No masses, lesions, tenderness or abnormalities EYES: normal, PERRLA, EOMI, Conjunctiva are pink and non-injected EARS: External ears normal OROPHARYNX:lips, buccal mucosa, and tongue normal and mucous membranes are moist  NECK: supple, trachea midline LYMPH:  No palpable adenopathy in the neck or supraclavicular regions, no axillary adenopathy BREAST:not examined LUNGS: Air to auscultation bilaterally with no wheezing or rhonchi HEART: S1 and S2 audible, regular, no ectopy ABDOMEN:abdomen soft and normal bowel sounds. No palpable liver edge. Ostomy site intact BACK: Back symmetric, no curvature. EXTREMITIES:less then 2 second capillary refill, no joint deformities, effusion, or inflammation, no cyanosis  NEURO: alert & oriented x 3 with fluent speech, no focal motor/sensory deficits, gait normal   LABORATORY DATA: I have reviewed the data below as listed.  Results for Tom [name], Tom [name] (MRN [id number]) as of 10/14/2015 12:02  Ref. Range 10/14/2015 10:00  Sodium Latest Ref Range: 135-145 mmol/L 136  Potassium Latest Ref Range: 3.5-5.1  mmol/L 4.0  Chloride Latest Ref Range: 101-111 mmol/L 103  CO2 Latest Ref Range: 22-32 mmol/L 26  BUN Latest Ref Range: 6-20 mg/dL 20  Creatinine Latest Ref Range: 0.61-1.24 mg/dL 0.88  Calcium Latest Ref Range: 8.9-10.3 mg/dL 8.9  EGFR (Non-African Amer.) Latest Ref Range: >60 mL/min >60  EGFR (African American) Latest Ref Range: >60 mL/min >60  Glucose Latest Ref Range: 65-99 mg/dL 89  Anion gap Latest Ref Range: 5-15  7  Alkaline Phosphatase Latest Ref Range: 38-126 U/L 83  Albumin Latest Ref Range: 3.5-5.0 g/dL 3.9  AST Latest Ref Range: 15-41 U/L 24  ALT Latest Ref Range: 17-63 U/L 25  Total Protein Latest Ref Range: 6.5-8.1 g/dL 7.0  Total Bilirubin Latest Ref Range: 0.3-1.2 mg/dL 0.4  WBC Latest Ref Range: 4.0-10.5 K/uL 7.4  RBC Latest Ref Range: 4.22-5.81 MIL/uL 4.45  Hemoglobin Latest Ref Range: 13.0-17.0 g/dL 13.4  HCT Latest Ref Range: 39.0-52.0 % 38.9 (L)  MCV Latest Ref Range: 78.0-100.0 fL 87.4  MCH Latest Ref Range: 26.0-34.0 pg 30.1  MCHC Latest Ref Range: 30.0-36.0 g/dL 34.4  RDW Latest Ref Range: 11.5-15.5 % 14.5  Platelets Latest Ref Range: 150-400 K/uL 215  Neutrophils Latest Units: % 56  Lymphocytes Latest Units: % 31  Monocytes Relative Latest Units: % 10  Eosinophil Latest Units: % 3  Basophil Latest Units: % 0  NEUT# Latest Ref Range: 1.7-7.7 K/uL 4.2  Lymphocyte # Latest Ref Range: 0.7-4.0 K/uL 2.3  Monocyte # Latest Ref Range: 0.1-1.0 K/uL 0.7  Eosinophils Absolute Latest Ref Range: 0.0-0.7 K/uL 0.2  Basophils Absolute Latest Ref Range: 0.0-0.1 K/uL 0.0     Results for Tom [name], Tom [name] (MRN [id number]) as of 10/14/2015 10:16  Ref. Range 07/01/2015 09:50 07/03/2015 11:08 07/22/2015 09:40 08/19/2015 10:14 09/16/2015 10:45  CEA Latest Ref Range: 0.0-4.7 ng/mL   1.8 1.7 2.0   RADIOLOGY: I have personally reviewed the radiological images as listed and agreed with the findings in the report. Study Result     CLINICAL DATA: Adenocarcinoma of the colon  with liver metastasis diagnosed 2/16. Status post chemotherapy.  EXAM: CT CHEST, ABDOMEN, AND PELVIS WITH CONTRAST  TECHNIQUE: Multidetector CT imaging of the chest, abdomen and pelvis was performed following the standard protocol during bolus administration of intravenous contrast.  CONTRAST: 177m OMNIPAQUE IOHEXOL 300 MG/ML SOLN  COMPARISON: 01/30/2015 abdominal pelvic CT. 10/22/2014 chest CT.  FINDINGS: CT CHEST FINDINGS  Mediastinum/Nodes: No supraclavicular adenopathy. A right Port-A-Cath which terminates at the high right atrium. Bovine arch. Aortic and branch vessel atherosclerosis. Normal heart size, without pericardial effusion. LAD coronary artery atherosclerosis. No central pulmonary embolism, on this non-dedicated study.  Low right paratracheal node measures 5 mm on image 23/series 2 versus 6 mm on the prior.  Subcarinal node measures 5 mm on image 29/series 2 versus 9 mm on the prior. No hilar adenopathy.  Lungs/Pleura: No pleural fluid. Previously described right sided pulmonary nodules are not identified.  A 2 mm left upper lobe pulmonary nodule on image 23/series 6 is nonspecific and not readily apparent on the prior. No lobar consolidation.  Musculoskeletal: No acute osseous abnormality.  CT ABDOMEN PELVIS FINDINGS  Hepatobiliary: Hepatic metastasis. Index segment 8 lesion measures 2.9 x 2.2 cm on image 51/ series 2. 4.0 x 4.8 cm on 01/30/2015.  Index segment 4B lesion measures 2.1 x 2.2 cm on image 61/series 2. Compare 3.2 x 3.8 cm on the prior.  Normal gallbladder, without biliary ductal dilatation.  Pancreas: Normal, without mass or ductal dilatation.  Spleen: Normal in size, without focal abnormality.  Adrenals/Urinary Tract: Normal adrenal glands. Left renal cysts and too small to characterize lesions. Normal right kidney, without hydronephrosis. Normal urinary bladder.  Stomach/Bowel: Normal stomach, without wall  thickening. Apparent stricturing at the mid sigmoid, including on image 97/ series 2. Similar. No well-defined mass in this area. Large stool burden just proximal of this, without high-grade obstruction. Normal terminal ileum and appendix.  The mid to distal ileum is intimately associated with this area on image 100/series 2. Likely adherent. This is the site of bowel obstruction on 01/30/2015. No evidence of residual or recurrent obstruction. Remainder of small bowel unremarkable.  Vascular/Lymphatic: No retroperitoneal or retrocrural adenopathy. No pelvic sidewall adenopathy. Nodes in the left side of the high mesorectum measure up to 8 mm on image 99/series 2. 7 mm on the prior.  Reproductive: Normal prostate.  Other: No significant free fluid. Tiny fat containing bilateral inguinal hernias. No evidence of omental or peritoneal disease.  Musculoskeletal: Spondylosis and degenerative disc disease throughout the lumbosacral spine.  IMPRESSION: CT CHEST IMPRESSION  1. Resolution right-sided pulmonary nodules and decrease in size of small mediastinal nodes. 2. A left upper lobe pulmonary nodule is indeterminate and not readily apparent on the prior exam. This may represent a subpleural lymph node. Recommend attention on follow-up. Age advanced coronary artery atherosclerosis. Recommend assessment of coronary risk factors and consideration of medical therapy.  CT ABDOMEN AND PELVIS IMPRESSION  1. Response to therapy of hepatic metastasis. 2. Stricture at the mid sigmoid colon, the site of prior malignancy. No well-defined residual soft tissue mass is seen. Large stool burden proximal to this site suggests constipation and perhaps a component of subclinical obstruction. No high-grade obstruction seen. 3. Small bowel again contacting this point of the sigmoid, likely adherent. Resolution of small bowel obstruction. 4. Similar to slight increase in suspicious nodes in  the high mesorectum.   Electronically Signed  By: Abigail Miyamoto M.D.  On: 07/03/2015 13:41     ASSESSMENT AND PLAN:  Stage IV colon cancer, KRAS WT BACK PAIN  50 year old male with stage IV colon cancer he has had a significant improvement in his disease and clinical status.He is currently on single agent 5-FU with excellent tolerance. His PS is excellent. CEA is WNL.  Unfortunately AVASTIN had to be discontinued secondary to persistent proteinuria.    Plan is for ongoing therapy with 5-FU. Given ongoing back pain I have recommended repeat imaging. He is agreeable. CT scans ordered. He was provided with his own prescription for valium. I have refilled his oxycodone. He will be advised of results once available.  Genetics Counseling  He has completed genetics counseling. His genetic test results were normal--i.e. testing found no meaningful changes to any of 19 genes associated with increased risks for colorectal and other types of cancers.  Nephrotic Range Proteinuria  He has been off of Avastin which is most commonly implicated in proteinuria since May 2. He has been seen by nephrology.    LLE RASH  He notes he is using his triamcinolone with improvement.   He will return for a visit when he comes for  his next chemotherapy cycle.  All questions were answered. The patient knows to call the clinic with any problems, questions or concerns. We can certainly see the patient much sooner if necessary.   This document serves as a record of services personally performed by Ancil Linsey, MD. It was created on her behalf by Arlyce Harman, a trained medical scribe. The creation of this record is based on the scribe's personal observations and the provider's statements to them. This document has been checked and approved by the attending provider.  I have reviewed the above documentation for accuracy and completeness, and I agree with the above.  This note was electronically  signed.  Kelby Fam. Whitney Muse, MD

## 2015-10-14 NOTE — Patient Instructions (Signed)
[place] at [facility] Discharge Instructions  RECOMMENDATIONS MADE BY THE CONSULTANT AND ANY TEST RESULTS WILL BE SENT TO YOUR REFERRING PHYSICIAN.  Exam and discussion today with Dr. Whitney Muse. CT scans as scheduled. Office visit with next cycle of chemotherapy. Prescription for Valium and Percocet given.  Thank you for choosing [place] at [facility] to provide your oncology and hematology care.  To afford each patient quality time with our provider, please arrive at least 15 minutes before your scheduled appointment time.   Beginning January 23rd 2017 lab work for the [name] will be done in the  Main lab at [facility] on 1st floor. If you have a lab appointment with the [place] please come in thru the  Main Entrance and check in at the main information desk  You need to re-schedule your appointment should you arrive 10 or more minutes late.  We strive to give you quality time with our providers, and arriving late affects you and other patients whose appointments are after yours.  Also, if you no show three or more times for appointments you may be dismissed from the clinic at the providers discretion.     Again, thank you for choosing [facility].  Our hope is that these requests will decrease the amount of time that you wait before being seen by our physicians.       _____________________________________________________________  Should you have questions after your visit to [facility], please contact our office at [phone] between the hours of 8:30 a.m. and 4:30 p.m.  Voicemails left after 4:30 p.m. will not be returned until the following business day.  For prescription refill requests, have your pharmacy contact our office.         Resources For Cancer Patients and their Caregivers ? American Cancer Society: Can assist with transportation, wigs, general needs, runs Look Good Feel  Better.        [phone] ? Cancer Care: Provides financial assistance, online support groups, medication/co-pay assistance.  1-800-813-HOPE (424) 473-1639) ? [place] Assists [place] cancer patients and their families through emotional , educational and financial support.  786-301-0461 ? [place] DSS Where to apply for food stamps, Medicaid and utility assistance. [phone] ? RCATS: Transportation to medical appointments. [phone] ? Social Security Administration: May apply for disability if have a Stage IV cancer. 4045068688 314-557-7788 ? [employer], Disability and Transit Services: Assists with nutrition, care and transit needs. [place] Support Programs: @10RELATIVEDAYS @ > Cancer Support Group  2nd Tuesday of the month 1pm-2pm, Journey Room  > Creative Journey  3rd Tuesday of the month 1130am-1pm, Journey Room  > Look Good Feel Better  1st Wednesday of the month 10am-12 noon, Journey Room (Call [place] to register [phone])

## 2015-10-14 NOTE — Patient Instructions (Signed)
[facility] Discharge Instructions for Patients Receiving Chemotherapy   Beginning January 23rd 2017 lab work for the [facility] will be done in the  Main lab at [facility] on 1st floor. If you have a lab appointment with the [place] please come in thru the  Main Entrance and check in at the main information desk   Today you received the following chemotherapy agents leucovorin and 83fu Return this week for treatment   To help prevent nausea and vomiting after your treatment, we encourage you to take your nausea medication    If you develop nausea and vomiting, or diarrhea that is not controlled by your medication, call the clinic.  The clinic phone number is [phone]. Office hours are Monday-Friday 8:30am-5:00pm.  BELOW ARE SYMPTOMS THAT SHOULD BE REPORTED IMMEDIATELY:  *FEVER GREATER THAN 101.0 F  *CHILLS WITH OR WITHOUT FEVER  NAUSEA AND VOMITING THAT IS NOT CONTROLLED WITH YOUR NAUSEA MEDICATION  *UNUSUAL SHORTNESS OF BREATH  *UNUSUAL BRUISING OR BLEEDING  TENDERNESS IN MOUTH AND THROAT WITH OR WITHOUT PRESENCE OF ULCERS  *URINARY PROBLEMS  *BOWEL PROBLEMS  UNUSUAL RASH Items with * indicate a potential emergency and should be followed up as soon as possible. If you have an emergency after office hours please contact your primary care physician or go to the nearest emergency department.  Please call the clinic during office hours if you have any questions or concerns.   You may also contact the Patient Navigator at [phone] should you have any questions or need assistance in obtaining follow up care.      Resources For Cancer Patients and their Caregivers ? American Cancer Society: Can assist with transportation, wigs, general needs, runs Look Good Feel Better.        [phone] ? Cancer Care: Provides financial assistance, online support groups, medication/co-pay assistance.  1-800-813-HOPE 540-065-0317) ? [place] Assists [place] cancer patients and their families through emotional , educational and financial support.  534-344-9465 ? [place] DSS Where to apply for food stamps, Medicaid and utility assistance. ([phone] ? RCATS: Transportation to medical appointments. ([phone] ? Social Security Administration: May apply for disability if have a Stage IV cancer. [phone] [phone] ? [employer], Disability and Transit Services: Assists with nutrition, care and transit needs. [phone]

## 2015-10-15 ENCOUNTER — Encounter (HOSPITAL_BASED_OUTPATIENT_CLINIC_OR_DEPARTMENT_OTHER): Payer: Medicaid Other

## 2015-10-15 VITALS — BP 107/69 | HR 60 | Temp 98.6°F | Resp 18

## 2015-10-15 DIAGNOSIS — Z5111 Encounter for antineoplastic chemotherapy: Secondary | ICD-10-CM

## 2015-10-15 DIAGNOSIS — C189 Malignant neoplasm of colon, unspecified: Secondary | ICD-10-CM

## 2015-10-15 DIAGNOSIS — C787 Secondary malignant neoplasm of liver and intrahepatic bile duct: Secondary | ICD-10-CM

## 2015-10-15 LAB — CEA: CEA: 1.9 ng/mL (ref 0.0–4.7)

## 2015-10-15 MED ORDER — PROCHLORPERAZINE MALEATE 10 MG PO TABS
10.0000 mg | ORAL_TABLET | Freq: Once | ORAL | Status: AC
  Administered 2015-10-15: 10 mg via ORAL
  Filled 2015-10-15: qty 1

## 2015-10-15 MED ORDER — SODIUM CHLORIDE 0.9 % IV SOLN
340.0000 mg/m2 | Freq: Once | INTRAVENOUS | Status: AC
  Administered 2015-10-15: 700 mg via INTRAVENOUS
  Filled 2015-10-15: qty 14

## 2015-10-15 MED ORDER — SODIUM CHLORIDE 0.9 % IV SOLN
Freq: Once | INTRAVENOUS | Status: AC
Start: 2015-10-15 — End: 2015-10-15
  Administered 2015-10-15: 11:00:00 via INTRAVENOUS

## 2015-10-15 MED ORDER — SODIUM CHLORIDE 0.9 % IJ SOLN
10.0000 mL | INTRAMUSCULAR | Status: DC | PRN
  Administered 2015-10-15: 10 mL
  Filled 2015-10-15: qty 10

## 2015-10-15 MED ORDER — LEUCOVORIN CALCIUM INJECTION 100 MG
20.0000 mg/m2 | Freq: Once | INTRAMUSCULAR | Status: AC
  Administered 2015-10-15: 42 mg via INTRAVENOUS
  Filled 2015-10-15: qty 2.1

## 2015-10-15 MED ORDER — SODIUM CHLORIDE 0.9 % IV SOLN
8.0000 mg | Freq: Once | INTRAVENOUS | Status: AC
  Administered 2015-10-15: 8 mg via INTRAVENOUS
  Filled 2015-10-15: qty 4

## 2015-10-15 MED ORDER — HEPARIN SOD (PORK) LOCK FLUSH 100 UNIT/ML IV SOLN
500.0000 [IU] | Freq: Once | INTRAVENOUS | Status: AC | PRN
  Administered 2015-10-15: 500 [IU]
  Filled 2015-10-15: qty 5

## 2015-10-15 NOTE — Patient Instructions (Signed)
[facility] Discharge Instructions for Patients Receiving Chemotherapy   Beginning January 23rd 2017 lab work for the [facility] will be done in the  Main lab at [facility] on 1st floor. If you have a lab appointment with the [place] please come in thru the  Main Entrance and check in at the main information desk   Today you received the following chemotherapy agents:  Leucovorin and 5FU  If you develop nausea and vomiting, or diarrhea that is not controlled by your medication, call the clinic.  The clinic phone number is [phone]. Office hours are Monday-Friday 8:30am-5:00pm.  BELOW ARE SYMPTOMS THAT SHOULD BE REPORTED IMMEDIATELY:  *FEVER GREATER THAN 101.0 F  *CHILLS WITH OR WITHOUT FEVER  NAUSEA AND VOMITING THAT IS NOT CONTROLLED WITH YOUR NAUSEA MEDICATION  *UNUSUAL SHORTNESS OF BREATH  *UNUSUAL BRUISING OR BLEEDING  TENDERNESS IN MOUTH AND THROAT WITH OR WITHOUT PRESENCE OF ULCERS  *URINARY PROBLEMS  *BOWEL PROBLEMS  UNUSUAL RASH Items with * indicate a potential emergency and should be followed up as soon as possible. If you have an emergency after office hours please contact your primary care physician or go to the nearest emergency department.  Please call the clinic during office hours if you have any questions or concerns.   You may also contact the Patient Navigator at [phone] should you have any questions or need assistance in obtaining follow up care.      Resources For Cancer Patients and their Caregivers ? American Cancer Society: Can assist with transportation, wigs, general needs, runs Look Good Feel Better.        3055053648 ? Cancer Care: Provides financial assistance, online support groups, medication/co-pay assistance.  1-800-813-HOPE (601) 313-4661) ? [place] Assists [place] cancer patients and their families through emotional , educational and financial support.   [phone] ? [place] DSS Where to apply for food stamps, Medicaid and utility assistance. 606-021-6661 ? RCATS: Transportation to medical appointments. 8063937438 ? Social Security Administration: May apply for disability if have a Stage IV cancer. [phone] ? [employer], Disability and Transit Services: Assists with nutrition, care and transit needs. [phone]

## 2015-10-16 ENCOUNTER — Encounter (HOSPITAL_BASED_OUTPATIENT_CLINIC_OR_DEPARTMENT_OTHER): Payer: Medicaid Other

## 2015-10-16 VITALS — BP 119/67 | HR 62 | Temp 98.1°F | Resp 16

## 2015-10-16 DIAGNOSIS — C189 Malignant neoplasm of colon, unspecified: Secondary | ICD-10-CM

## 2015-10-16 DIAGNOSIS — Z5111 Encounter for antineoplastic chemotherapy: Secondary | ICD-10-CM

## 2015-10-16 DIAGNOSIS — C787 Secondary malignant neoplasm of liver and intrahepatic bile duct: Secondary | ICD-10-CM

## 2015-10-16 MED ORDER — HEPARIN SOD (PORK) LOCK FLUSH 100 UNIT/ML IV SOLN
500.0000 [IU] | Freq: Once | INTRAVENOUS | Status: AC
  Administered 2015-10-16: 500 [IU] via INTRAVENOUS

## 2015-10-16 MED ORDER — PROCHLORPERAZINE MALEATE 10 MG PO TABS
10.0000 mg | ORAL_TABLET | Freq: Once | ORAL | Status: AC
  Administered 2015-10-16: 10 mg via ORAL

## 2015-10-16 MED ORDER — ONDANSETRON HCL 40 MG/20ML IJ SOLN
8.0000 mg | Freq: Once | INTRAMUSCULAR | Status: AC
  Administered 2015-10-16: 8 mg via INTRAVENOUS
  Filled 2015-10-16: qty 4

## 2015-10-16 MED ORDER — FLUOROURACIL CHEMO INJECTION 2.5 GM/50ML
340.0000 mg/m2 | Freq: Once | INTRAVENOUS | Status: AC
  Administered 2015-10-16: 700 mg via INTRAVENOUS
  Filled 2015-10-16: qty 14

## 2015-10-16 MED ORDER — PROCHLORPERAZINE MALEATE 10 MG PO TABS
ORAL_TABLET | ORAL | Status: AC
  Filled 2015-10-16: qty 1

## 2015-10-16 MED ORDER — LEUCOVORIN CALCIUM INJECTION 100 MG
20.0000 mg/m2 | Freq: Once | INTRAMUSCULAR | Status: AC
  Administered 2015-10-16: 42 mg via INTRAVENOUS
  Filled 2015-10-16: qty 2.1

## 2015-10-16 MED ORDER — HEPARIN SOD (PORK) LOCK FLUSH 100 UNIT/ML IV SOLN
INTRAVENOUS | Status: AC
  Filled 2015-10-16: qty 5

## 2015-10-16 MED ORDER — SODIUM CHLORIDE 0.9 % IV SOLN
Freq: Once | INTRAVENOUS | Status: AC
  Administered 2015-10-16: 11:00:00 via INTRAVENOUS

## 2015-10-16 NOTE — Progress Notes (Signed)
Patient tolerated infusion well.  VSS.   

## 2015-10-16 NOTE — Patient Instructions (Signed)
[facility] Discharge Instructions for Patients Receiving Chemotherapy   Beginning January 23rd 2017 lab work for the [facility] will be done in the  Main lab at [facility] on 1st floor. If you have a lab appointment with the [place] please come in thru the  Main Entrance and check in at the main information desk   Today you received the following chemotherapy agents: leucovorin and fluorouracil.     If you develop nausea and vomiting, or diarrhea that is not controlled by your medication, call the clinic.  The clinic phone number is [phone]. Office hours are Monday-Friday 8:30am-5:00pm.  BELOW ARE SYMPTOMS THAT SHOULD BE REPORTED IMMEDIATELY:  *FEVER GREATER THAN 101.0 F  *CHILLS WITH OR WITHOUT FEVER  NAUSEA AND VOMITING THAT IS NOT CONTROLLED WITH YOUR NAUSEA MEDICATION  *UNUSUAL SHORTNESS OF BREATH  *UNUSUAL BRUISING OR BLEEDING  TENDERNESS IN MOUTH AND THROAT WITH OR WITHOUT PRESENCE OF ULCERS  *URINARY PROBLEMS  *BOWEL PROBLEMS  UNUSUAL RASH Items with * indicate a potential emergency and should be followed up as soon as possible. If you have an emergency after office hours please contact your primary care physician or go to the nearest emergency department.  Please call the clinic during office hours if you have any questions or concerns.   You may also contact the Patient Navigator at [phone] should you have any questions or need assistance in obtaining follow up care.      Resources For Cancer Patients and their Caregivers ? American Cancer Society: Can assist with transportation, wigs, general needs, runs Look Good Feel Better.        435-617-3884 ? Cancer Care: Provides financial assistance, online support groups, medication/co-pay assistance.  1-800-813-HOPE (949)671-3693) ? [place] Assists [place] cancer patients and their families through emotional , educational and financial support.   [phone] ? [place] DSS Where to apply for food stamps, Medicaid and utility assistance. [phone] ? RCATS: Transportation to medical appointments. [phone] ? Social Security Administration: May apply for disability if have a Stage IV cancer. [phone] [phone] ? [employer], Disability and Transit Services: Assists with nutrition, care and transit needs. [phone]

## 2015-10-17 ENCOUNTER — Encounter (HOSPITAL_BASED_OUTPATIENT_CLINIC_OR_DEPARTMENT_OTHER): Payer: Medicaid Other

## 2015-10-17 VITALS — BP 102/66 | HR 62 | Temp 98.2°F | Resp 18 | Wt 217.4 lb

## 2015-10-17 DIAGNOSIS — C189 Malignant neoplasm of colon, unspecified: Secondary | ICD-10-CM

## 2015-10-17 DIAGNOSIS — Z5111 Encounter for antineoplastic chemotherapy: Secondary | ICD-10-CM

## 2015-10-17 DIAGNOSIS — C787 Secondary malignant neoplasm of liver and intrahepatic bile duct: Secondary | ICD-10-CM

## 2015-10-17 MED ORDER — SODIUM CHLORIDE 0.9 % IJ SOLN: 10.0000 mL | INTRAMUSCULAR | Status: DC | PRN

## 2015-10-17 MED ORDER — LEUCOVORIN CALCIUM INJECTION 100 MG
20.0000 mg/m2 | Freq: Once | INTRAMUSCULAR | Status: AC
  Administered 2015-10-17: 42 mg via INTRAVENOUS
  Filled 2015-10-17: qty 2.1

## 2015-10-17 MED ORDER — PROCHLORPERAZINE MALEATE 10 MG PO TABS
10.0000 mg | ORAL_TABLET | Freq: Once | ORAL | Status: AC
  Administered 2015-10-17: 10 mg via ORAL

## 2015-10-17 MED ORDER — SODIUM CHLORIDE 0.9 % IV SOLN
8.0000 mg | Freq: Once | INTRAVENOUS | Status: AC
  Administered 2015-10-17: 8 mg via INTRAVENOUS
  Filled 2015-10-17: qty 4

## 2015-10-17 MED ORDER — HEPARIN SOD (PORK) LOCK FLUSH 100 UNIT/ML IV SOLN
500.0000 [IU] | Freq: Once | INTRAVENOUS | Status: AC | PRN
  Administered 2015-10-17: 500 [IU]

## 2015-10-17 MED ORDER — SODIUM CHLORIDE 0.9 % IV SOLN
340.0000 mg/m2 | Freq: Once | INTRAVENOUS | Status: AC
  Administered 2015-10-17: 700 mg via INTRAVENOUS
  Filled 2015-10-17: qty 14

## 2015-10-17 MED ORDER — PROCHLORPERAZINE MALEATE 10 MG PO TABS
ORAL_TABLET | ORAL | Status: AC
  Filled 2015-10-17: qty 1

## 2015-10-17 MED ORDER — SODIUM CHLORIDE 0.9 % IV SOLN
Freq: Once | INTRAVENOUS | Status: AC
  Administered 2015-10-17: 11:00:00 via INTRAVENOUS

## 2015-10-17 NOTE — Patient Instructions (Signed)
[facility] Discharge Instructions for Patients Receiving Chemotherapy   Beginning January 23rd 2017 lab work for the [facility] will be done in the  Main lab at [facility] on 1st floor. If you have a lab appointment with the [place] please come in thru the  Main Entrance and check in at the main information desk   Today you received the following chemotherapy agents: Leucovorin and Fluorouracil.     If you develop nausea and vomiting, or diarrhea that is not controlled by your medication, call the clinic.  The clinic phone number is [phone]. Office hours are Monday-Friday 8:30am-5:00pm.  BELOW ARE SYMPTOMS THAT SHOULD BE REPORTED IMMEDIATELY:  *FEVER GREATER THAN 101.0 F  *CHILLS WITH OR WITHOUT FEVER  NAUSEA AND VOMITING THAT IS NOT CONTROLLED WITH YOUR NAUSEA MEDICATION  *UNUSUAL SHORTNESS OF BREATH  *UNUSUAL BRUISING OR BLEEDING  TENDERNESS IN MOUTH AND THROAT WITH OR WITHOUT PRESENCE OF ULCERS  *URINARY PROBLEMS  *BOWEL PROBLEMS  UNUSUAL RASH Items with * indicate a potential emergency and should be followed up as soon as possible. If you have an emergency after office hours please contact your primary care physician or go to the nearest emergency department.  Please call the clinic during office hours if you have any questions or concerns.   You may also contact the Patient Navigator at [phone] should you have any questions or need assistance in obtaining follow up care.      Resources For Cancer Patients and their Caregivers ? American Cancer Society: Can assist with transportation, wigs, general needs, runs Look Good Feel Better.        [phone] ? Cancer Care: Provides financial assistance, online support groups, medication/co-pay assistance.  1-800-813-HOPE [phone]) ? [place] Assists [place] cancer patients and their families through emotional , educational and financial support.   [phone] ? [place] DSS Where to apply for food stamps, Medicaid and utility assistance. 762-375-2271 ? RCATS: Transportation to medical appointments. [phone] ? Social Security Administration: May apply for disability if have a Stage IV cancer. [phone] ? [employer], Disability and Transit Services: Assists with nutrition, care and transit needs. [phone]

## 2015-10-17 NOTE — Progress Notes (Signed)
Patient tolerated infusion well.  VSS.   

## 2015-10-18 ENCOUNTER — Encounter (HOSPITAL_BASED_OUTPATIENT_CLINIC_OR_DEPARTMENT_OTHER): Payer: Medicaid Other

## 2015-10-18 VITALS — BP 110/70 | HR 56 | Temp 98.0°F | Resp 18 | Wt 220.8 lb

## 2015-10-18 DIAGNOSIS — C787 Secondary malignant neoplasm of liver and intrahepatic bile duct: Secondary | ICD-10-CM

## 2015-10-18 DIAGNOSIS — C189 Malignant neoplasm of colon, unspecified: Secondary | ICD-10-CM

## 2015-10-18 DIAGNOSIS — Z5111 Encounter for antineoplastic chemotherapy: Secondary | ICD-10-CM

## 2015-10-18 MED ORDER — PROCHLORPERAZINE MALEATE 10 MG PO TABS
10.0000 mg | ORAL_TABLET | Freq: Once | ORAL | Status: AC
  Administered 2015-10-18: 10 mg via ORAL
  Filled 2015-10-18: qty 1

## 2015-10-18 MED ORDER — SODIUM CHLORIDE 0.9 % IJ SOLN
10.0000 mL | INTRAMUSCULAR | Status: DC | PRN
  Administered 2015-10-18: 10 mL
  Filled 2015-10-18: qty 10

## 2015-10-18 MED ORDER — HEPARIN SOD (PORK) LOCK FLUSH 100 UNIT/ML IV SOLN
500.0000 [IU] | Freq: Once | INTRAVENOUS | Status: AC | PRN
  Administered 2015-10-18: 500 [IU]
  Filled 2015-10-18: qty 5

## 2015-10-18 MED ORDER — LEUCOVORIN CALCIUM INJECTION 100 MG
20.0000 mg/m2 | Freq: Once | INTRAMUSCULAR | Status: AC
  Administered 2015-10-18: 42 mg via INTRAVENOUS
  Filled 2015-10-18: qty 2.1

## 2015-10-18 MED ORDER — SODIUM CHLORIDE 0.9 % IV SOLN
Freq: Once | INTRAVENOUS | Status: AC
  Administered 2015-10-18: 11:00:00 via INTRAVENOUS

## 2015-10-18 MED ORDER — SODIUM CHLORIDE 0.9 % IV SOLN
340.0000 mg/m2 | Freq: Once | INTRAVENOUS | Status: AC
  Administered 2015-10-18: 700 mg via INTRAVENOUS
  Filled 2015-10-18: qty 14

## 2015-10-18 MED ORDER — SODIUM CHLORIDE 0.9 % IV SOLN
8.0000 mg | Freq: Once | INTRAVENOUS | Status: AC
  Administered 2015-10-18: 8 mg via INTRAVENOUS
  Filled 2015-10-18: qty 4

## 2015-10-21 NOTE — Patient Instructions (Signed)
[facility] Discharge Instructions for Patients Receiving Chemotherapy   Beginning January 23rd 2017 lab work for the [facility] will be done in the  Main lab at [facility] on 1st floor. If you have a lab appointment with the [place] please come in thru the  Main Entrance and check in at the main information desk   Today you received the following chemotherapy agents:  5FU and leucovorin  If you develop nausea and vomiting, or diarrhea that is not controlled by your medication, call the clinic.  The clinic phone number is [phone]. Office hours are Monday-Friday 8:30am-5:00pm.  BELOW ARE SYMPTOMS THAT SHOULD BE REPORTED IMMEDIATELY:  *FEVER GREATER THAN 101.0 F  *CHILLS WITH OR WITHOUT FEVER  NAUSEA AND VOMITING THAT IS NOT CONTROLLED WITH YOUR NAUSEA MEDICATION  *UNUSUAL SHORTNESS OF BREATH  *UNUSUAL BRUISING OR BLEEDING  TENDERNESS IN MOUTH AND THROAT WITH OR WITHOUT PRESENCE OF ULCERS  *URINARY PROBLEMS  *BOWEL PROBLEMS  UNUSUAL RASH Items with * indicate a potential emergency and should be followed up as soon as possible. If you have an emergency after office hours please contact your primary care physician or go to the nearest emergency department.  Please call the clinic during office hours if you have any questions or concerns.   You may also contact the Patient Navigator at [phone] should you have any questions or need assistance in obtaining follow up care.      Resources For Cancer Patients and their Caregivers ? American Cancer Society: Can assist with transportation, wigs, general needs, runs Look Good Feel Better.        [phone] ? Cancer Care: Provides financial assistance, online support groups, medication/co-pay assistance.  1-800-813-HOPE 7577543525) ? [place] Assists [place] cancer patients and their families through emotional , educational and financial support.   [phone] ? [place] DSS Where to apply for food stamps, Medicaid and utility assistance. [phone] ? RCATS: Transportation to medical appointments. 7081294227 ? Social Security Administration: May apply for disability if have a Stage IV cancer. [phone] ? [employer], Disability and Transit Services: Assists with nutrition, care and transit needs. [phone]

## 2015-10-29 ENCOUNTER — Ambulatory Visit (HOSPITAL_COMMUNITY)
Admission: RE | Admit: 2015-10-29 | Discharge: 2015-10-29 | Disposition: A | Discharge status: Home / Self Care | Payer: Medicaid Other | Source: Ambulatory Visit | Attending: Hematology & Oncology | Admitting: Hematology & Oncology

## 2015-10-29 DIAGNOSIS — C189 Malignant neoplasm of colon, unspecified: Secondary | ICD-10-CM | POA: Diagnosis not present

## 2015-10-29 DIAGNOSIS — C787 Secondary malignant neoplasm of liver and intrahepatic bile duct: Secondary | ICD-10-CM | POA: Diagnosis not present

## 2015-10-29 DIAGNOSIS — C187 Malignant neoplasm of sigmoid colon: Secondary | ICD-10-CM | POA: Diagnosis present

## 2015-10-29 DIAGNOSIS — R911 Solitary pulmonary nodule: Secondary | ICD-10-CM | POA: Diagnosis not present

## 2015-10-29 MED ORDER — IOPAMIDOL (ISOVUE-300) INJECTION 61%
100.0000 mL | Freq: Once | INTRAVENOUS | Status: AC | PRN
  Administered 2015-10-29: 150 mL via INTRAVENOUS

## 2015-11-11 ENCOUNTER — Encounter (HOSPITAL_COMMUNITY): Payer: Self-pay

## 2015-11-11 ENCOUNTER — Encounter (HOSPITAL_COMMUNITY): Payer: Medicaid Other | Attending: Hematology & Oncology | Admitting: Hematology & Oncology

## 2015-11-11 ENCOUNTER — Encounter (HOSPITAL_BASED_OUTPATIENT_CLINIC_OR_DEPARTMENT_OTHER): Payer: Medicaid Other

## 2015-11-11 VITALS — BP 122/69 | HR 72 | Temp 98.0°F | Resp 16 | Wt 221.8 lb

## 2015-11-11 VITALS — BP 105/70 | HR 62 | Temp 97.8°F | Resp 18

## 2015-11-11 DIAGNOSIS — R809 Proteinuria, unspecified: Secondary | ICD-10-CM | POA: Diagnosis not present

## 2015-11-11 DIAGNOSIS — C7800 Secondary malignant neoplasm of unspecified lung: Secondary | ICD-10-CM

## 2015-11-11 DIAGNOSIS — C7802 Secondary malignant neoplasm of left lung: Secondary | ICD-10-CM | POA: Diagnosis not present

## 2015-11-11 DIAGNOSIS — C189 Malignant neoplasm of colon, unspecified: Secondary | ICD-10-CM | POA: Diagnosis not present

## 2015-11-11 DIAGNOSIS — C787 Secondary malignant neoplasm of liver and intrahepatic bile duct: Secondary | ICD-10-CM | POA: Diagnosis not present

## 2015-11-11 DIAGNOSIS — M545 Low back pain, unspecified: Secondary | ICD-10-CM

## 2015-11-11 DIAGNOSIS — C187 Malignant neoplasm of sigmoid colon: Secondary | ICD-10-CM | POA: Insufficient documentation

## 2015-11-11 DIAGNOSIS — C7801 Secondary malignant neoplasm of right lung: Secondary | ICD-10-CM

## 2015-11-11 DIAGNOSIS — Z5111 Encounter for antineoplastic chemotherapy: Secondary | ICD-10-CM | POA: Diagnosis present

## 2015-11-11 LAB — COMPREHENSIVE METABOLIC PANEL
ALBUMIN: 3.7 g/dL (ref 3.5–5.0)
ALK PHOS: 79 U/L (ref 38–126)
ALT: 25 U/L (ref 17–63)
ANION GAP: 5 (ref 5–15)
AST: 24 U/L (ref 15–41)
BUN: 15 mg/dL (ref 6–20)
CO2: 25 mmol/L (ref 22–32)
Calcium: 8.7 mg/dL — ABNORMAL LOW (ref 8.9–10.3)
Chloride: 106 mmol/L (ref 101–111)
Creatinine, Ser: 0.77 mg/dL (ref 0.61–1.24)
GFR calc Af Amer: >60 mL/min (ref >60)
GFR calc non Af Amer: >60 mL/min (ref >60)
GLUCOSE: 140 mg/dL — AB (ref 65–99)
POTASSIUM: 3.8 mmol/L (ref 3.5–5.1)
SODIUM: 136 mmol/L (ref 135–145)
Total Bilirubin: 0.6 mg/dL (ref 0.3–1.2)
Total Protein: 7 g/dL (ref 6.5–8.1)

## 2015-11-11 LAB — CBC WITH DIFFERENTIAL/PLATELET
BASOS PCT: 0 %
Basophils Absolute: 0 10*3/uL (ref 0.0–0.1)
EOS ABS: 0.2 10*3/uL (ref 0.0–0.7)
Eosinophils Relative: 3 %
HCT: 37.6 % — ABNORMAL LOW (ref 39.0–52.0)
HEMOGLOBIN: 12.9 g/dL — AB (ref 13.0–17.0)
Lymphocytes Relative: 25 %
Lymphs Abs: 1.9 10*3/uL (ref 0.7–4.0)
MCH: 30.1 pg (ref 26.0–34.0)
MCHC: 34.3 g/dL (ref 30.0–36.0)
MCV: 87.9 fL (ref 78.0–100.0)
MONOS PCT: 5 %
Monocytes Absolute: 0.4 10*3/uL (ref 0.1–1.0)
NEUTROS PCT: 67 %
Neutro Abs: 5.2 10*3/uL (ref 1.7–7.7)
Platelets: 224 10*3/uL (ref 150–400)
RBC: 4.28 MIL/uL (ref 4.22–5.81)
RDW: 14.4 % (ref 11.5–15.5)
WBC: 7.7 10*3/uL (ref 4.0–10.5)

## 2015-11-11 MED ORDER — HEPARIN SOD (PORK) LOCK FLUSH 100 UNIT/ML IV SOLN
500.0000 [IU] | Freq: Once | INTRAVENOUS | Status: AC | PRN
  Administered 2015-11-11: 500 [IU]
  Filled 2015-11-11: qty 5

## 2015-11-11 MED ORDER — PROCHLORPERAZINE MALEATE 10 MG PO TABS
ORAL_TABLET | ORAL | Status: AC
  Filled 2015-11-11: qty 1

## 2015-11-11 MED ORDER — SODIUM CHLORIDE 0.9 % IV SOLN
Freq: Once | INTRAVENOUS | Status: AC
  Administered 2015-11-11: 12:00:00 via INTRAVENOUS

## 2015-11-11 MED ORDER — SODIUM CHLORIDE 0.9 % IV SOLN
340.0000 mg/m2 | Freq: Once | INTRAVENOUS | Status: AC
  Administered 2015-11-11: 700 mg via INTRAVENOUS
  Filled 2015-11-11: qty 14

## 2015-11-11 MED ORDER — SODIUM CHLORIDE 0.9 % IV SOLN
8.0000 mg | Freq: Once | INTRAVENOUS | Status: AC
  Administered 2015-11-11: 8 mg via INTRAVENOUS
  Filled 2015-11-11: qty 4

## 2015-11-11 MED ORDER — LEUCOVORIN CALCIUM INJECTION 100 MG
20.0000 mg/m2 | Freq: Once | INTRAMUSCULAR | Status: AC
  Administered 2015-11-11: 42 mg via INTRAVENOUS
  Filled 2015-11-11: qty 2.1

## 2015-11-11 MED ORDER — SODIUM CHLORIDE 0.9 % IJ SOLN: 10.0000 mL | INTRAMUSCULAR | Status: DC | PRN

## 2015-11-11 MED ORDER — PROCHLORPERAZINE MALEATE 10 MG PO TABS
10.0000 mg | ORAL_TABLET | Freq: Once | ORAL | Status: AC
  Administered 2015-11-11: 10 mg via ORAL

## 2015-11-11 NOTE — Progress Notes (Signed)
Tom [name] Tolerated chemotherapy well today

## 2015-11-11 NOTE — Progress Notes (Signed)
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 Appearance is most likely to represent diffuse hepatic metastasis   07/30/2014 Initial Diagnosis Colon, biopsy, sigmoid - ADENOCARCINOMA.   07/31/2014 Tumor Marker CEA- 48.0   07/31/2014 Imaging Ct abd/pelvis- concerning for primary colorectal neoplasm in the mid to distal sigmoid colon, with lymphadenopathy in the sigmoid mesocolon, ileocolic mesentery, and retroperitoneum, as well as widespread metastatic disease to the liver   08/01/2014 Imaging CT chest- Pathologic thoracic, right hilar, and infrahilar adenopathy associated with a masslike region of possible consolidation in the right middle lobe, surrounding nodularity, and some scattered bilateral pulmonary nodules.   08/06/2014 - 12/10/2014 Chemotherapy FOLFOX.  Avastin added for cycle 2.  S/P 10 cycles.  Change to maintenance therapy secondary to positive response to therapy, increasing PN, and progressive proteinuria.   10/08/2014 Adverse Reaction Avastin induced proteinuria >/= 2 gm Avastin on hold   10/15/2014 Treatment Plan Change Avastin held for proteinuria.   10/22/2014 Imaging Ct CAP- 1. Interval decrease and soft tissue adjacent to the mid sigmoid colon with decreasing sigmoid colon wall thickening. The  leocolic, mesenteric, perirectal, and mesocolonic lymphadenopathy has decreased in the interval.   10/30/2014 Survivorship Genetic Counseling. Genetic testing was normal, and did not reveal a deleterious mutation in these genes. The test report will be scanned into EPIC and will be located  under the Media tab.    11/26/2014 Treatment Plan Change Oxaliplatin reduced x 25% due to PN.   12/24/2014 Treatment Plan Change Change to maintanence therapy.   01/09/2015 - 03/26/2015 Chemotherapy Xeloda 2500 mg BID, 7 days on and 7 days off.   01/30/2015 Imaging CT in ED- Evidence of distal small bowel obstruction with transition point over the ileum in the right upper pelvis immediately adjacent/abutting the known malignant stricturing of the sigmoid colon. There is wall thickening of the sigmoid colon wi...   01/30/2015 - 01/31/2015 Hospital Admission SBO (small bowel obstruction)   03/26/2015 Treatment Plan Change Xeloda cost-prohibitive without financial help   04/15/2015 -  Chemotherapy 5FU/Leucovorin days 1-5 every 28 days   05/13/2015 Adverse Reaction Palmar-Plantar erythrodysesthesia   05/13/2015 Treatment Plan Change 5FU/Leucovorin held   05/28/2015 Treatment Plan Change 5FU dose reduced by 20%   07/03/2015 Imaging CT CAP- Resolution right-sided pulmonary nodules and decrease in size of small mediastinal nodes. Response to therapy of hepatic metastasis. Similar to slight increase in suspicious nodes in the high mesorectum.   10/29/2015 Imaging CT CAP- Further reduction in size of the hepatic metastatic tumors, compatible with interval effective therapy. 2 mm left upper lobe pulmonary nodule has been stable over the past 15 months. Likely benign, may merit surveillance.     CURRENT THERAPY:  5-FU  INTERVAL HISTORY: Tom [name] 50 y.o. 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  Last CEA values have been WNL.  He presents today to review recent repeat staging.   Tom [name] returns to the [facility] today accompanied by his wife. He is in  a treatment chair today.  He again notes his lower back pain, which he believes might be just from yard work. He says he will take an ibuprofen and walk  maybe a mile or two, which can help him "work it out." In addition to this, he remarks that sometimes he has a good bowel movement, which can cause the pain to ease off, too.   He notes that he believes the main problem exacerbating his back pain is his consistent yard work, pulling limbs, riding the lawnmower, etc.. He adds that sometimes if he loosens his britches, it also helps to alleviate the pain. He denies that the pain is getting worse and confirms that he would tell us if it was.   Other than this, he confirms that he's eating well, saying "I eat too well." He is remaining active, walking about two miles a day.  He denies any nausea or vomiting. He says sometimes he will have a large, hard bowel movement. He says he's never constipated, just that sometimes it takes him a while to relieve himself.  He takes stool softener in the morning and at night, reporting 200 mg morning and 300 mg at night. He was advised that he can try the Miralax once or twice a day or simply increase his stool softener if he wishes.  During the physical exam, he notes that he believes the rash on his L leg is doing better, saying "I've been putting that cream on it that you gave me," twice a day. He just started doing this about a week ago.  No nausea or vomiting. No other major complaints or concerns.    Past Medical History  Diagnosis Date  . 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     has No Known Allergies.  We administered Influenza vac split quadrivalent PF, heparin lock  flush, and sodium chloride.  Past Surgical History  Procedure Laterality Date  . Esophagogastroduodenoscopy N/A 07/30/2014    Procedure: ESOPHAGOGASTRODUODENOSCOPY (EGD);  Surgeon: Inda Castle, MD;  Location: [facility];  Service: Endoscopy;  Laterality: N/A;  . Colonoscopy N/A 07/30/2014    Procedure: COLONOSCOPY;  Surgeon: Inda Castle, MD;  Location: [facility];  Service: Endoscopy;  Laterality: N/A;  . Portacath placement Right 08/01/14  . Portacath placement N/A 08/01/2014    Procedure: INSERTION RIGHT IJ PORT-A-CATH WITH ULTRASOUND AND FLUORO;  Surgeon: Gayland Curry, MD;  Location: [facility];  Service: General;  Laterality: N/A;  . Operative ultrasound N/A 08/01/2014    Procedure: OPERATIVE ULTRASOUND;  Surgeon: Gayland Curry, MD;  Location: [facility];  Service: General;  Laterality: N/A;    Denies any headaches, dizziness, double vision, fevers, chills, night sweats, nausea, vomiting, diarrhea, chest pain, heart palpitations, shortness of breath, blood in stool, black tarry stool, urinary pain, urinary burning, urinary frequency, hematuria. Positive for back pain.     Managed with diazepam. Positive for tingling.     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 Wears glasses. In treatment chair. SKIN: skin color, texture, turgor are normal, raised erythematous rash on the anterior LLE smaller than prior, but persistent HEAD: Normocephalic, No masses, lesions, tenderness or abnormalities EYES: normal, PERRLA, EOMI, Conjunctiva are pink and  non-injected EARS: External ears normal OROPHARYNX:lips, buccal mucosa, and tongue normal and mucous membranes are moist  NECK: supple, trachea midline LYMPH:  No palpable adenopathy in the neck or supraclavicular regions, no axillary adenopathy BREAST:not examined LUNGS: Air to auscultation bilaterally with no wheezing or rhonchi HEART: S1 and S2 audible, regular, no ectopy ABDOMEN:abdomen soft and normal bowel sounds. No palpable liver edge. Ostomy site intact BACK: Back symmetric, no curvature. EXTREMITIES:less then 2 second capillary refill, no joint deformities, effusion, or inflammation, no cyanosis NEURO: alert & oriented x 3 with fluent speech, no focal motor/sensory deficits, gait normal   LABORATORY DATA: I have reviewed the data below as listed.  CBC    Component Value Date/Time   WBC 7.7 11/11/2015 0922   RBC 4.28 11/11/2015 0922   RBC 3.42* 07/28/2014 1440   HGB 12.9* 11/11/2015 0922   HCT 37.6* 11/11/2015 0922   PLT 224 11/11/2015 0922   MCV 87.9 11/11/2015 0922   MCH 30.1 11/11/2015 0922   MCHC 34.3 11/11/2015 0922   RDW 14.4 11/11/2015 0922   LYMPHSABS 1.9 11/11/2015 0922   MONOABS 0.4 11/11/2015 0922   EOSABS 0.2 11/11/2015 0922   BASOSABS 0.0 11/11/2015 0922   CMP     Component Value Date/Time   NA 136 11/11/2015 0922   K 3.8 11/11/2015 0922   CL 106 11/11/2015 0922   CO2 25 11/11/2015 0922   GLUCOSE 140* 11/11/2015 0922   BUN 15 11/11/2015 0922   CREATININE 0.77 11/11/2015 0922   CALCIUM 8.7* 11/11/2015 0922   PROT 7.0 11/11/2015 0922   ALBUMIN 3.7 11/11/2015 0922   AST 24 11/11/2015 0922   ALT 25 11/11/2015 0922   ALKPHOS 79 11/11/2015 0922   BILITOT 0.6 11/11/2015 0922   GFRNONAA >60 11/11/2015 0922   GFRAA >60 11/11/2015 0922   Results for CLIVE, PARCEL (MRN [id number]) as of 11/12/2015 14:49  Ref. Range 06/25/2015 09:58 07/22/2015 09:40 08/19/2015 10:14 09/16/2015 10:45 10/14/2015 10:00  CEA Latest Ref Range: 0.0-4.7 ng/mL 1.4 1.8 1.7 2.0  1.9    RADIOLOGY: I have personally reviewed the radiological images as listed and agreed with the findings in the report.  Study Result     CLINICAL DATA: Colon cancer with liver metastatic disease. Ongoing chemotherapy.  EXAM: CT CHEST, ABDOMEN, AND PELVIS WITH CONTRAST  TECHNIQUE: Multidetector CT imaging of the chest, abdomen and pelvis was performed following the standard protocol during bolus administration of intravenous contrast.  CONTRAST: 132m ISOVUE-300 IOPAMIDOL (ISOVUE-300) INJECTION 61%  COMPARISON: Multiple exams, including 07/03/2015  FINDINGS: CT CHEST FINDINGS  Mediastinum/Nodes: Mild atherosclerotic calcification of the aortic arch. Port-A-Cath tip terminates at the upper cavoatrial junction.  Lungs/Pleura: 2 mm left upper lobe pulmonary nodule on image 54/10, stable, and also visible on image 23 of series 5 of the prior exam from 07/31/2014. This indicates 15 months stability of this lesion which is likely benign.  Musculoskeletal: Unremarkable  CT ABDOMEN PELVIS FINDINGS  Hepatobiliary: Further reduction  in size and conspicuity of the hepatic metastatic lesions identified. A right hepatic lobe lesion on image 19/ 2 measures 2.0 by 2.0 cm, formerly 3.1 by 2.9 cm by my measurements. The other lesions are proportionally reduced in volume as well. No new hepatic metastatic lesions are identified. Gallbladder unremarkable.  Pancreas: Unremarkable  Spleen: Unremarkable  Adrenals/Urinary Tract: Adrenal glands normal. Several left renal cysts are present along with several hypodense lesions of the left kidney which are technically too small to characterize but likely cysts. A left kidney upper pole cystic lesion measuring 3.1 by 2.4 cm appears to have some faint dependent density suggesting a complex cyst.  Stomach/Bowel: Mild perirectal stranding. As before, there are some perirectal lymph nodes including a 7 mm in short axis  node posterior to the rectum on image 68/2 (formerly 8 mm). Along the inferior margin of the inferior mesenteric artery as it approaches the narrowing in the sigmoid colon there is some ill-defined soft tissue density on images 61-65 series 2, again very similar to prior, probably from scarring given the adhered appearance of the distal ileum to this region. There is some wall thickening and narrowing in the rectosigmoid junction example image 65/2.  Vascular/Lymphatic: Minimal aortoiliac atherosclerotic vascular disease. Small perirectal lymph nodes as noted above.  Reproductive: Unremarkable  Other: No supplemental non-categorized findings.  Musculoskeletal: Lumbar spondylosis and degenerative disc disease.  IMPRESSION: 1. Further reduction in size of the hepatic metastatic tumors, compatible with interval effective therapy. 2. 2 mm left upper lobe pulmonary nodule has been stable over the past 15 months. Likely benign, may merit surveillance. 3. There continues to be some apparent scarring just above the rectosigmoid junction, with some tethering of a distal loop of ileum in this vicinity, and soft tissue density in this area extending along the inferior extent of the IMA. Tumor is a less likely cause for this appearance. However, there is some narrowing of the rectosigmoid junction in this region. Small perirectal lymph nodes are essentially stable from prior. 4. No new lesions are identified.   Electronically Signed  By: Van Clines M.D.  On: 10/29/2015 14:51    ASSESSMENT AND PLAN:  Stage IV colon cancer, KRAS WT BACK PAIN  50 year old male with stage IV colon cancer he has had a significant improvement in his disease and clinical status.He is currently on single agent 5-FU with excellent tolerance. His PS is excellent. CEA is WNL.  Unfortunately AVASTIN had to be discontinued secondary to persistent proteinuria.    Plan is for ongoing therapy with  5-FU. CT imaging was reviewed in detail. We will continue with ongoing therapy as planned. He will return to clinic for repeat PE, labs and ongoing therapy with his next cycle.   Genetics Counseling  He has completed genetics counseling. His genetic test results were normal--i.e. testing found no meaningful changes to any of 19 genes associated with increased risks for colorectal and other types of cancers.  Nephrotic Range Proteinuria  He has been off of Avastin which is most commonly implicated in proteinuria since May 2. He has been seen by nephrology.   All questions were answered. The patient knows to call the clinic with any problems, questions or concerns. We can certainly see the patient much sooner if necessary.   This document serves as a record of services personally performed by Ancil Linsey, MD. It was created on her behalf by Toni Amend, a trained medical scribe. The creation of this record is based on the scribe's personal  observations and the provider's statements to them. This document has been checked and approved by the attending provider.  I have reviewed the above documentation for accuracy and completeness, and I agree with the above.  This note was electronically signed.  Kelby Fam. Whitney Muse, MD

## 2015-11-11 NOTE — Patient Instructions (Signed)
   [place] at [facility] Discharge Instructions  RECOMMENDATIONS MADE BY THE CONSULTANT AND ANY TEST RESULTS WILL BE SENT TO YOUR REFERRING PHYSICIAN.  Exam done and seen today by Dr. Gustavus Bryant reviewed today. Return to see the doctor at next chemo cycle in july Please call the clinic if you have any questions or concerns  Thank you for choosing [place] at [facility] to provide your oncology and hematology care.  To afford each patient quality time with our provider, please arrive at least 15 minutes before your scheduled appointment time.   Beginning January 23rd 2017 lab work for the [name] will be done in the  Main lab at [facility] on 1st floor. If you have a lab appointment with the [facility] please come in thru the  Main Entrance and check in at the main information desk  You need to re-schedule your appointment should you arrive 10 or more minutes late.  We strive to give you quality time with our providers, and arriving late affects you and other patients whose appointments are after yours.  Also, if you no show three or more times for appointments you may be dismissed from the clinic at the providers discretion.     Again, thank you for choosing [facility].  Our hope is that these requests will decrease the amount of time that you wait before being seen by our physicians.       _____________________________________________________________  Should you have questions after your visit to [facility], please contact our office at [phone] between the hours of 8:30 a.m. and 4:30 p.m.  Voicemails left after 4:30 p.m. will not be returned until the following business day.  For prescription refill requests, have your pharmacy contact our office.         Resources For Cancer Patients and their Caregivers ? American Cancer Society: Can assist with transportation, wigs, general needs,  runs Look Good Feel Better.        [phone] ? Cancer Care: Provides financial assistance, online support groups, medication/co-pay assistance.  1-800-813-HOPE 774-307-3569) ? [place] Assists [place] cancer patients and their families through emotional , educational and financial support.  (253)640-1639 ? [place] DSS Where to apply for food stamps, Medicaid and utility assistance. 314-424-2312 ? RCATS: Transportation to medical appointments. 210-242-4806 ? Social Security Administration: May apply for disability if have a Stage IV cancer. ([phone] 201-103-1841 ? [employer], Disability and Transit Services: Assists with nutrition, care and transit needs. [place] Support Programs: @10RELATIVEDAYS @ > Cancer Support Group  2nd Tuesday of the month 1pm-2pm, Journey Room  > Creative Journey  3rd Tuesday of the month 1130am-1pm, Journey Room  > Look Good Feel Better  1st Wednesday of the month 10am-12 noon, Journey Room (Call [place] to register [phone])

## 2015-11-11 NOTE — Patient Instructions (Signed)
[facility] Discharge Instructions for Patients Receiving Chemotherapy   Beginning January 23rd 2017 lab work for the [facility] will be done in the  Main lab at [facility] on 1st floor. If you have a lab appointment with the [facility] please come in thru the  Main Entrance and check in at the main information desk   Today you received the following chemotherapy agents leucovorin and 49fu Follow up as scheduled Please call the clinic if you have any questions or concerns   To help prevent nausea and vomiting after your treatment, we encourage you to take your nausea medication    If you develop nausea and vomiting, or diarrhea that is not controlled by your medication, call the clinic.  The clinic phone number is [phone]. Office hours are Monday-Friday 8:30am-5:00pm.  BELOW ARE SYMPTOMS THAT SHOULD BE REPORTED IMMEDIATELY:  *FEVER GREATER THAN 101.0 F  *CHILLS WITH OR WITHOUT FEVER  NAUSEA AND VOMITING THAT IS NOT CONTROLLED WITH YOUR NAUSEA MEDICATION  *UNUSUAL SHORTNESS OF BREATH  *UNUSUAL BRUISING OR BLEEDING  TENDERNESS IN MOUTH AND THROAT WITH OR WITHOUT PRESENCE OF ULCERS  *URINARY PROBLEMS  *BOWEL PROBLEMS  UNUSUAL RASH Items with * indicate a potential emergency and should be followed up as soon as possible. If you have an emergency after office hours please contact your primary care physician or go to the nearest emergency department.  Please call the clinic during office hours if you have any questions or concerns.   You may also contact the Patient Navigator at [phone] should you have any questions or need assistance in obtaining follow up care.      Resources For Cancer Patients and their Caregivers ? American Cancer Society: Can assist with transportation, wigs, general needs, runs Look Good Feel Better.        870-547-6557 ? Cancer Care: Provides financial assistance, online support groups, medication/co-pay  assistance.  1-800-813-HOPE (440)319-5508) ? [place] Assists [place] cancer patients and their families through emotional , educational and financial support.  7325784698 ? [place] DSS Where to apply for food stamps, Medicaid and utility assistance. 805-126-3768 ? RCATS: Transportation to medical appointments. (815)081-9585 ? Social Security Administration: May apply for disability if have a Stage IV cancer. 832-888-7019 [phone])606-4531 ? [employer], Disability and Transit Services: Assists with nutrition, care and transit needs. [phone]

## 2015-11-12 ENCOUNTER — Encounter (HOSPITAL_BASED_OUTPATIENT_CLINIC_OR_DEPARTMENT_OTHER): Payer: Medicaid Other

## 2015-11-12 ENCOUNTER — Encounter (HOSPITAL_COMMUNITY): Payer: Self-pay

## 2015-11-12 ENCOUNTER — Encounter (HOSPITAL_COMMUNITY): Payer: Self-pay | Admitting: Hematology & Oncology

## 2015-11-12 ENCOUNTER — Ambulatory Visit (HOSPITAL_COMMUNITY): Payer: Self-pay | Admitting: Hematology & Oncology

## 2015-11-12 VITALS — BP 97/65 | HR 57 | Temp 98.3°F | Resp 18 | Wt 223.2 lb

## 2015-11-12 DIAGNOSIS — Z5111 Encounter for antineoplastic chemotherapy: Secondary | ICD-10-CM

## 2015-11-12 DIAGNOSIS — C787 Secondary malignant neoplasm of liver and intrahepatic bile duct: Secondary | ICD-10-CM | POA: Diagnosis not present

## 2015-11-12 DIAGNOSIS — C189 Malignant neoplasm of colon, unspecified: Secondary | ICD-10-CM

## 2015-11-12 MED ORDER — LEUCOVORIN CALCIUM INJECTION 100 MG
20.0000 mg/m2 | Freq: Once | INTRAMUSCULAR | Status: AC
  Administered 2015-11-12: 42 mg via INTRAVENOUS
  Filled 2015-11-12: qty 2.1

## 2015-11-12 MED ORDER — HEPARIN SOD (PORK) LOCK FLUSH 100 UNIT/ML IV SOLN
500.0000 [IU] | Freq: Once | INTRAVENOUS | Status: AC | PRN
  Administered 2015-11-12: 500 [IU]
  Filled 2015-11-12: qty 5

## 2015-11-12 MED ORDER — SODIUM CHLORIDE 0.9 % IV SOLN
340.0000 mg/m2 | Freq: Once | INTRAVENOUS | Status: AC
  Administered 2015-11-12: 700 mg via INTRAVENOUS
  Filled 2015-11-12: qty 14

## 2015-11-12 MED ORDER — PROCHLORPERAZINE MALEATE 10 MG PO TABS
10.0000 mg | ORAL_TABLET | Freq: Once | ORAL | Status: AC
  Administered 2015-11-12: 10 mg via ORAL
  Filled 2015-11-12: qty 1

## 2015-11-12 MED ORDER — SODIUM CHLORIDE 0.9 % IV SOLN
8.0000 mg | Freq: Once | INTRAVENOUS | Status: AC
  Administered 2015-11-12: 8 mg via INTRAVENOUS
  Filled 2015-11-12: qty 4

## 2015-11-12 MED ORDER — SODIUM CHLORIDE 0.9 % IV SOLN
Freq: Once | INTRAVENOUS | Status: AC
Start: 2015-11-12 — End: 2015-11-12
  Administered 2015-11-12: 11:00:00 via INTRAVENOUS

## 2015-11-12 MED ORDER — SODIUM CHLORIDE 0.9 % IJ SOLN: 10.0000 mL | INTRAMUSCULAR | Status: DC | PRN

## 2015-11-12 NOTE — Patient Instructions (Signed)
[facility] Discharge Instructions for Patients Receiving Chemotherapy   Beginning January 23rd 2017 lab work for the [facility] will be done in the  Main lab at [facility] on 1st floor. If you have a lab appointment with the [place] please come in thru the  Main Entrance and check in at the main information desk   Today you received the following chemotherapy agents lecovorin and 79fu   To help prevent nausea and vomiting after your treatment, we encourage you to take your nausea medication .   If you develop nausea and vomiting, or diarrhea that is not controlled by your medication, call the clinic.  The clinic phone number is [phone]. Office hours are Monday-Friday 8:30am-5:00pm.  BELOW ARE SYMPTOMS THAT SHOULD BE REPORTED IMMEDIATELY:  *FEVER GREATER THAN 101.0 F  *CHILLS WITH OR WITHOUT FEVER  NAUSEA AND VOMITING THAT IS NOT CONTROLLED WITH YOUR NAUSEA MEDICATION  *UNUSUAL SHORTNESS OF BREATH  *UNUSUAL BRUISING OR BLEEDING  TENDERNESS IN MOUTH AND THROAT WITH OR WITHOUT PRESENCE OF ULCERS  *URINARY PROBLEMS  *BOWEL PROBLEMS  UNUSUAL RASH Items with * indicate a potential emergency and should be followed up as soon as possible. If you have an emergency after office hours please contact your primary care physician or go to the nearest emergency department.  Please call the clinic during office hours if you have any questions or concerns.   You may also contact the Patient Navigator at [phone] should you have any questions or need assistance in obtaining follow up care.      Resources For Cancer Patients and their Caregivers ? American Cancer Society: Can assist with transportation, wigs, general needs, runs Look Good Feel Better.        [phone] ? Cancer Care: Provides financial assistance, online support groups, medication/co-pay assistance.  1-800-813-HOPE 314-409-6686) ? [place] Assists  [place] cancer patients and their families through emotional , educational and financial support.  ([phone] ? [place] DSS Where to apply for food stamps, Medicaid and utility assistance. 580-325-3910 ? RCATS: Transportation to medical appointments. (502)451-5784 ? Social Security Administration: May apply for disability if have a Stage IV cancer. 709-733-7879 4128241001 ? [employer], Disability and Transit Services: Assists with nutrition, care and transit needs. [phone]

## 2015-11-12 NOTE — Progress Notes (Signed)
Tom [name], tolerated chemotherapy well today  Discharged ambulatory

## 2015-11-13 ENCOUNTER — Encounter (HOSPITAL_COMMUNITY): Payer: Self-pay

## 2015-11-13 ENCOUNTER — Encounter (HOSPITAL_BASED_OUTPATIENT_CLINIC_OR_DEPARTMENT_OTHER): Payer: Medicaid Other

## 2015-11-13 VITALS — BP 106/68 | HR 64 | Temp 98.3°F | Resp 18 | Wt 221.0 lb

## 2015-11-13 DIAGNOSIS — C189 Malignant neoplasm of colon, unspecified: Secondary | ICD-10-CM

## 2015-11-13 DIAGNOSIS — C787 Secondary malignant neoplasm of liver and intrahepatic bile duct: Secondary | ICD-10-CM

## 2015-11-13 DIAGNOSIS — Z5111 Encounter for antineoplastic chemotherapy: Secondary | ICD-10-CM

## 2015-11-13 MED ORDER — HEPARIN SOD (PORK) LOCK FLUSH 100 UNIT/ML IV SOLN
500.0000 [IU] | Freq: Once | INTRAVENOUS | Status: AC | PRN
  Administered 2015-11-13: 500 [IU]
  Filled 2015-11-13: qty 5

## 2015-11-13 MED ORDER — LEUCOVORIN CALCIUM INJECTION 100 MG
20.0000 mg/m2 | Freq: Once | INTRAMUSCULAR | Status: AC
Start: 2015-11-13 — End: 2015-11-13
  Administered 2015-11-13: 42 mg via INTRAVENOUS
  Filled 2015-11-13: qty 2.1

## 2015-11-13 MED ORDER — SODIUM CHLORIDE 0.9 % IV SOLN
340.0000 mg/m2 | Freq: Once | INTRAVENOUS | Status: AC
  Administered 2015-11-13: 700 mg via INTRAVENOUS
  Filled 2015-11-13: qty 14

## 2015-11-13 MED ORDER — SODIUM CHLORIDE 0.9 % IJ SOLN: 10.0000 mL | INTRAMUSCULAR | Status: DC | PRN

## 2015-11-13 MED ORDER — SODIUM CHLORIDE 0.9 % IV SOLN
Freq: Once | INTRAVENOUS | Status: AC
  Administered 2015-11-13: 11:00:00 via INTRAVENOUS

## 2015-11-13 MED ORDER — SODIUM CHLORIDE 0.9 % IV SOLN
8.0000 mg | Freq: Once | INTRAVENOUS | Status: AC
  Administered 2015-11-13: 8 mg via INTRAVENOUS
  Filled 2015-11-13: qty 4

## 2015-11-13 MED ORDER — PROCHLORPERAZINE MALEATE 10 MG PO TABS
10.0000 mg | ORAL_TABLET | Freq: Once | ORAL | Status: AC
  Administered 2015-11-13: 10 mg via ORAL
  Filled 2015-11-13: qty 1

## 2015-11-13 NOTE — Patient Instructions (Signed)
[place] at [facility] Discharge Instructions  RECOMMENDATIONS MADE BY THE CONSULTANT AND ANY TEST RESULTS WILL BE SENT TO YOUR REFERRING PHYSICIAN.  Chemotherapy today Follow up as scheduled   Thank you for choosing [place] at [facility] to provide your oncology and hematology care.  To afford each patient quality time with our provider, please arrive at least 15 minutes before your scheduled appointment time.   Beginning January 23rd 2017 lab work for the [name] will be done in the  Main lab at [facility] on 1st floor. If you have a lab appointment with the [facility] please come in thru the  Main Entrance and check in at the main information desk  You need to re-schedule your appointment should you arrive 10 or more minutes late.  We strive to give you quality time with our providers, and arriving late affects you and other patients whose appointments are after yours.  Also, if you no show three or more times for appointments you may be dismissed from the clinic at the providers discretion.     Again, thank you for choosing [facility].  Our hope is that these requests will decrease the amount of time that you wait before being seen by our physicians.       _____________________________________________________________  Should you have questions after your visit to [facility], please contact our office at [phone] between the hours of 8:30 a.m. and 4:30 p.m.  Voicemails left after 4:30 p.m. will not be returned until the following business day.  For prescription refill requests, have your pharmacy contact our office.         Resources For Cancer Patients and their Caregivers ? American Cancer Society: Can assist with transportation, wigs, general needs, runs Look Good Feel Better.        ([phone] ? Cancer Care: Provides financial assistance, online support groups, medication/co-pay  assistance.  1-800-813-HOPE 212 499 5762) ? [place] Assists [place] cancer patients and their families through emotional , educational and financial support.  (867) 776-6911 ? [place] DSS Where to apply for food stamps, Medicaid and utility assistance. 929-356-4639 ? RCATS: Transportation to medical appointments. 9306934368 ? Social Security Administration: May apply for disability if have a Stage IV cancer. 780-083-5534 7541514659 ? [employer], Disability and Transit Services: Assists with nutrition, care and transit needs. [place] Support Programs: @10RELATIVEDAYS @ > Cancer Support Group  2nd Tuesday of the month 1pm-2pm, Journey Room  > Creative Journey  3rd Tuesday of the month 1130am-1pm, Journey Room  > Look Good Feel Better  1st Wednesday of the month 10am-12 noon, Journey Room (Call [place] to register [phone])

## 2015-11-13 NOTE — Progress Notes (Signed)
Tom [name] Tolerated chemotherapy well Discharged ambulatory

## 2015-11-14 ENCOUNTER — Encounter (HOSPITAL_COMMUNITY): Payer: Self-pay

## 2015-11-14 ENCOUNTER — Encounter (HOSPITAL_BASED_OUTPATIENT_CLINIC_OR_DEPARTMENT_OTHER): Payer: Medicaid Other

## 2015-11-14 VITALS — BP 111/68 | HR 65 | Temp 98.0°F | Resp 20 | Wt 221.1 lb

## 2015-11-14 DIAGNOSIS — Z5111 Encounter for antineoplastic chemotherapy: Secondary | ICD-10-CM | POA: Diagnosis present

## 2015-11-14 DIAGNOSIS — C189 Malignant neoplasm of colon, unspecified: Secondary | ICD-10-CM

## 2015-11-14 DIAGNOSIS — C787 Secondary malignant neoplasm of liver and intrahepatic bile duct: Secondary | ICD-10-CM | POA: Diagnosis not present

## 2015-11-14 MED ORDER — SODIUM CHLORIDE 0.9 % IJ SOLN: 10.0000 mL | INTRAMUSCULAR | Status: DC | PRN

## 2015-11-14 MED ORDER — SODIUM CHLORIDE 0.9 % IV SOLN
8.0000 mg | Freq: Once | INTRAVENOUS | Status: AC
  Administered 2015-11-14: 8 mg via INTRAVENOUS
  Filled 2015-11-14: qty 4

## 2015-11-14 MED ORDER — SODIUM CHLORIDE 0.9 % IV SOLN
Freq: Once | INTRAVENOUS | Status: AC
  Administered 2015-11-14: 11:00:00 via INTRAVENOUS

## 2015-11-14 MED ORDER — SODIUM CHLORIDE 0.9 % IV SOLN
340.0000 mg/m2 | Freq: Once | INTRAVENOUS | Status: AC
  Administered 2015-11-14: 700 mg via INTRAVENOUS
  Filled 2015-11-14: qty 14

## 2015-11-14 MED ORDER — LEUCOVORIN CALCIUM INJECTION 100 MG
20.0000 mg/m2 | Freq: Once | INTRAMUSCULAR | Status: AC
  Administered 2015-11-14: 42 mg via INTRAVENOUS
  Filled 2015-11-14: qty 2.1

## 2015-11-14 MED ORDER — PROCHLORPERAZINE MALEATE 10 MG PO TABS
10.0000 mg | ORAL_TABLET | Freq: Once | ORAL | Status: AC
  Administered 2015-11-14: 10 mg via ORAL
  Filled 2015-11-14: qty 1

## 2015-11-14 MED ORDER — HEPARIN SOD (PORK) LOCK FLUSH 100 UNIT/ML IV SOLN
500.0000 [IU] | Freq: Once | INTRAVENOUS | Status: AC | PRN
  Administered 2015-11-14: 500 [IU]
  Filled 2015-11-14: qty 5

## 2015-11-14 NOTE — Patient Instructions (Signed)
[place] at [facility] Discharge Instructions  RECOMMENDATIONS MADE BY THE CONSULTANT AND ANY TEST RESULTS WILL BE SENT TO YOUR REFERRING PHYSICIAN.  Chemotherapy today Chemotherapy tomorrow  Follow up as scheduled   Thank you for choosing [place] at [facility] to provide your oncology and hematology care.  To afford each patient quality time with our provider, please arrive at least 15 minutes before your scheduled appointment time.   Beginning January 23rd 2017 lab work for the [name] will be done in the  Main lab at [facility] on 1st floor. If you have a lab appointment with the [facility] please come in thru the  Main Entrance and check in at the main information desk  You need to re-schedule your appointment should you arrive 10 or more minutes late.  We strive to give you quality time with our providers, and arriving late affects you and other patients whose appointments are after yours.  Also, if you no show three or more times for appointments you may be dismissed from the clinic at the providers discretion.     Again, thank you for choosing [facility].  Our hope is that these requests will decrease the amount of time that you wait before being seen by our physicians.       _____________________________________________________________  Should you have questions after your visit to [facility], please contact our office at [phone] between the hours of 8:30 a.m. and 4:30 p.m.  Voicemails left after 4:30 p.m. will not be returned until the following business day.  For prescription refill requests, have your pharmacy contact our office.         Resources For Cancer Patients and their Caregivers ? American Cancer Society: Can assist with transportation, wigs, general needs, runs Look Good Feel Better.        ([phone] ? Cancer Care: Provides financial assistance, online support  groups, medication/co-pay assistance.  1-800-813-HOPE 519-443-5891) ? [place] Assists [place] cancer patients and their families through emotional , educational and financial support.  2696176301 ? [place] DSS Where to apply for food stamps, Medicaid and utility assistance. 602-855-6223 ? RCATS: Transportation to medical appointments. 508-181-4490 ? Social Security Administration: May apply for disability if have a Stage IV cancer. 269-772-3115 724-137-9022 ? [employer], Disability and Transit Services: Assists with nutrition, care and transit needs. [place] Support Programs: @10RELATIVEDAYS @ > Cancer Support Group  2nd Tuesday of the month 1pm-2pm, Journey Room  > Creative Journey  3rd Tuesday of the month 1130am-1pm, Journey Room  > Look Good Feel Better  1st Wednesday of the month 10am-12 noon, Journey Room (Call [place] to register [phone])

## 2015-11-14 NOTE — Progress Notes (Signed)
Tom [name] Tolerated chemotherapy well today Discharged ambulatory 

## 2015-11-15 ENCOUNTER — Encounter (HOSPITAL_COMMUNITY): Payer: Self-pay

## 2015-11-15 ENCOUNTER — Encounter (HOSPITAL_BASED_OUTPATIENT_CLINIC_OR_DEPARTMENT_OTHER): Payer: Medicaid Other

## 2015-11-15 VITALS — BP 113/68 | HR 61 | Temp 98.0°F | Resp 18 | Wt 221.0 lb

## 2015-11-15 DIAGNOSIS — C787 Secondary malignant neoplasm of liver and intrahepatic bile duct: Secondary | ICD-10-CM

## 2015-11-15 DIAGNOSIS — C189 Malignant neoplasm of colon, unspecified: Secondary | ICD-10-CM

## 2015-11-15 DIAGNOSIS — Z5111 Encounter for antineoplastic chemotherapy: Secondary | ICD-10-CM | POA: Diagnosis present

## 2015-11-15 MED ORDER — SODIUM CHLORIDE 0.9 % IV SOLN
340.0000 mg/m2 | Freq: Once | INTRAVENOUS | Status: AC
  Administered 2015-11-15: 700 mg via INTRAVENOUS
  Filled 2015-11-15: qty 14

## 2015-11-15 MED ORDER — LEUCOVORIN CALCIUM INJECTION 100 MG
20.0000 mg/m2 | Freq: Once | INTRAMUSCULAR | Status: AC
  Administered 2015-11-15: 42 mg via INTRAVENOUS
  Filled 2015-11-15: qty 2.1

## 2015-11-15 MED ORDER — PROCHLORPERAZINE MALEATE 10 MG PO TABS
10.0000 mg | ORAL_TABLET | Freq: Once | ORAL | Status: AC
  Administered 2015-11-15: 10 mg via ORAL
  Filled 2015-11-15: qty 1

## 2015-11-15 MED ORDER — SODIUM CHLORIDE 0.9 % IV SOLN
8.0000 mg | Freq: Once | INTRAVENOUS | Status: AC
  Administered 2015-11-15: 8 mg via INTRAVENOUS
  Filled 2015-11-15: qty 4

## 2015-11-15 MED ORDER — HEPARIN SOD (PORK) LOCK FLUSH 100 UNIT/ML IV SOLN
500.0000 [IU] | Freq: Once | INTRAVENOUS | Status: AC | PRN
  Administered 2015-11-15: 500 [IU]
  Filled 2015-11-15: qty 5

## 2015-11-15 MED ORDER — SODIUM CHLORIDE 0.9 % IJ SOLN: 10.0000 mL | INTRAMUSCULAR | Status: DC | PRN

## 2015-11-15 MED ORDER — SODIUM CHLORIDE 0.9 % IV SOLN
Freq: Once | INTRAVENOUS | Status: AC
  Administered 2015-11-15: 09:00:00 via INTRAVENOUS

## 2015-11-15 NOTE — Progress Notes (Signed)
Tom [name] Tolerated chemotherapy well today Discharged ambulatory 

## 2015-11-15 NOTE — Patient Instructions (Signed)
[place] at [facility] Discharge Instructions  RECOMMENDATIONS MADE BY THE CONSULTANT AND ANY TEST RESULTS WILL BE SENT TO YOUR REFERRING PHYSICIAN.  Chemotherapy today Follow up as scheduled Please call the clinic if you have any questions or concerns   Thank you for choosing [place] at [facility] to provide your oncology and hematology care.  To afford each patient quality time with our provider, please arrive at least 15 minutes before your scheduled appointment time.   Beginning January 23rd 2017 lab work for the [name] will be done in the  Main lab at [facility] on 1st floor. If you have a lab appointment with the [facility] please come in thru the  Main Entrance and check in at the main information desk  You need to re-schedule your appointment should you arrive 10 or more minutes late.  We strive to give you quality time with our providers, and arriving late affects you and other patients whose appointments are after yours.  Also, if you no show three or more times for appointments you may be dismissed from the clinic at the providers discretion.     Again, thank you for choosing [facility].  Our hope is that these requests will decrease the amount of time that you wait before being seen by our physicians.       _____________________________________________________________  Should you have questions after your visit to [facility], please contact our office at [phone] between the hours of 8:30 a.m. and 4:30 p.m.  Voicemails left after 4:30 p.m. will not be returned until the following business day.  For prescription refill requests, have your pharmacy contact our office.         Resources For Cancer Patients and their Caregivers ? American Cancer Society: Can assist with transportation, wigs, general needs, runs Look Good Feel Better.        [phone] ? Cancer Care: Provides  financial assistance, online support groups, medication/co-pay assistance.  1-800-813-HOPE 5753215879) ? [place] Assists [place] cancer patients and their families through emotional , educational and financial support.  816-534-3611 ? [place] DSS Where to apply for food stamps, Medicaid and utility assistance. 248 054 2030 ? RCATS: Transportation to medical appointments. 434 687 6125 ? Social Security Administration: May apply for disability if have a Stage IV cancer. 984-429-1421 (365)037-9786 ? [employer], Disability and Transit Services: Assists with nutrition, care and transit needs. [place] Support Programs: @10RELATIVEDAYS @ > Cancer Support Group  2nd Tuesday of the month 1pm-2pm, Journey Room  > Creative Journey  3rd Tuesday of the month 1130am-1pm, Journey Room  > Look Good Feel Better  1st Wednesday of the month 10am-12 noon, Journey Room (Call [place] to register [phone])

## 2015-12-09 ENCOUNTER — Inpatient Hospital Stay (HOSPITAL_COMMUNITY): Payer: Self-pay

## 2015-12-09 ENCOUNTER — Encounter (HOSPITAL_COMMUNITY): Payer: Self-pay | Admitting: Hematology & Oncology

## 2015-12-09 ENCOUNTER — Encounter (HOSPITAL_BASED_OUTPATIENT_CLINIC_OR_DEPARTMENT_OTHER): Payer: Medicaid Other

## 2015-12-09 ENCOUNTER — Encounter (HOSPITAL_COMMUNITY): Payer: Medicaid Other | Attending: Hematology & Oncology | Admitting: Hematology & Oncology

## 2015-12-09 VITALS — BP 131/75 | HR 59 | Temp 97.8°F | Resp 16 | Wt 223.0 lb

## 2015-12-09 VITALS — BP 127/71 | HR 54 | Temp 98.2°F | Resp 18

## 2015-12-09 DIAGNOSIS — C187 Malignant neoplasm of sigmoid colon: Secondary | ICD-10-CM | POA: Diagnosis not present

## 2015-12-09 DIAGNOSIS — C189 Malignant neoplasm of colon, unspecified: Secondary | ICD-10-CM | POA: Diagnosis present

## 2015-12-09 DIAGNOSIS — C787 Secondary malignant neoplasm of liver and intrahepatic bile duct: Secondary | ICD-10-CM

## 2015-12-09 DIAGNOSIS — Z5111 Encounter for antineoplastic chemotherapy: Secondary | ICD-10-CM

## 2015-12-09 DIAGNOSIS — C7802 Secondary malignant neoplasm of left lung: Secondary | ICD-10-CM

## 2015-12-09 DIAGNOSIS — R809 Proteinuria, unspecified: Secondary | ICD-10-CM

## 2015-12-09 DIAGNOSIS — M545 Low back pain, unspecified: Secondary | ICD-10-CM

## 2015-12-09 DIAGNOSIS — C7801 Secondary malignant neoplasm of right lung: Secondary | ICD-10-CM

## 2015-12-09 DIAGNOSIS — R109 Unspecified abdominal pain: Secondary | ICD-10-CM | POA: Diagnosis not present

## 2015-12-09 LAB — CBC WITH DIFFERENTIAL/PLATELET
Basophils Absolute: 0 10*3/uL (ref 0.0–0.1)
Basophils Relative: 0 %
EOS ABS: 0.2 10*3/uL (ref 0.0–0.7)
Eosinophils Relative: 3 %
HCT: 38.5 % — ABNORMAL LOW (ref 39.0–52.0)
HEMOGLOBIN: 13.2 g/dL (ref 13.0–17.0)
LYMPHS ABS: 1.9 10*3/uL (ref 0.7–4.0)
LYMPHS PCT: 33 %
MCH: 29.8 pg (ref 26.0–34.0)
MCHC: 34.3 g/dL (ref 30.0–36.0)
MCV: 86.9 fL (ref 78.0–100.0)
Monocytes Absolute: 0.5 10*3/uL (ref 0.1–1.0)
Monocytes Relative: 9 %
NEUTROS PCT: 55 %
Neutro Abs: 3.1 10*3/uL (ref 1.7–7.7)
Platelets: 190 10*3/uL (ref 150–400)
RBC: 4.43 MIL/uL (ref 4.22–5.81)
RDW: 14.6 % (ref 11.5–15.5)
WBC: 5.7 10*3/uL (ref 4.0–10.5)

## 2015-12-09 LAB — COMPREHENSIVE METABOLIC PANEL
ALK PHOS: 87 U/L (ref 38–126)
ALT: 24 U/L (ref 17–63)
AST: 28 U/L (ref 15–41)
Albumin: 3.9 g/dL (ref 3.5–5.0)
Anion gap: 5 (ref 5–15)
BUN: 15 mg/dL (ref 6–20)
CALCIUM: 8.6 mg/dL — AB (ref 8.9–10.3)
CO2: 24 mmol/L (ref 22–32)
CREATININE: 0.97 mg/dL (ref 0.61–1.24)
Chloride: 107 mmol/L (ref 101–111)
GFR calc non Af Amer: >60 mL/min (ref >60)
GLUCOSE: 135 mg/dL — AB (ref 65–99)
Potassium: 3.8 mmol/L (ref 3.5–5.1)
SODIUM: 136 mmol/L (ref 135–145)
Total Bilirubin: 0.4 mg/dL (ref 0.3–1.2)
Total Protein: 6.9 g/dL (ref 6.5–8.1)

## 2015-12-09 MED ORDER — FLUOROURACIL CHEMO INJECTION 2.5 GM/50ML
340.0000 mg/m2 | Freq: Once | INTRAVENOUS | Status: AC
  Administered 2015-12-09: 700 mg via INTRAVENOUS
  Filled 2015-12-09: qty 14

## 2015-12-09 MED ORDER — SODIUM CHLORIDE 0.9 % IJ SOLN
10.0000 mL | INTRAMUSCULAR | Status: DC | PRN
  Administered 2015-12-09: 10 mL
  Filled 2015-12-09: qty 10

## 2015-12-09 MED ORDER — LEUCOVORIN CALCIUM INJECTION 100 MG
20.0000 mg/m2 | Freq: Once | INTRAMUSCULAR | Status: AC
  Administered 2015-12-09: 42 mg via INTRAVENOUS
  Filled 2015-12-09: qty 2.1

## 2015-12-09 MED ORDER — PROCHLORPERAZINE MALEATE 10 MG PO TABS
10.0000 mg | ORAL_TABLET | Freq: Once | ORAL | Status: AC
  Administered 2015-12-09: 10 mg via ORAL
  Filled 2015-12-09: qty 1

## 2015-12-09 MED ORDER — SODIUM CHLORIDE 0.9 % IV SOLN
Freq: Once | INTRAVENOUS | Status: AC
  Administered 2015-12-09: 10:00:00 via INTRAVENOUS

## 2015-12-09 MED ORDER — OXYCODONE-ACETAMINOPHEN 5-325 MG PO TABS: 1.0000 | ORAL_TABLET | ORAL | Status: DC | PRN

## 2015-12-09 MED ORDER — SODIUM CHLORIDE 0.9 % IV SOLN
8.0000 mg | Freq: Once | INTRAVENOUS | Status: AC
  Administered 2015-12-09: 8 mg via INTRAVENOUS
  Filled 2015-12-09: qty 4

## 2015-12-09 MED ORDER — HEPARIN SOD (PORK) LOCK FLUSH 100 UNIT/ML IV SOLN
500.0000 [IU] | Freq: Once | INTRAVENOUS | Status: AC | PRN
  Administered 2015-12-09: 500 [IU]
  Filled 2015-12-09: qty 5

## 2015-12-09 NOTE — Progress Notes (Signed)
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 Appearance is most likely to represent diffuse hepatic metastasis   07/30/2014 Initial Diagnosis Colon, biopsy, sigmoid - ADENOCARCINOMA.   07/31/2014 Tumor Marker CEA- 48.0   07/31/2014 Imaging Ct abd/pelvis- concerning for primary colorectal neoplasm in the mid to distal sigmoid colon, with lymphadenopathy in the sigmoid mesocolon, ileocolic mesentery, and retroperitoneum, as well as widespread metastatic disease to the liver   08/01/2014 Imaging CT chest- Pathologic thoracic, right hilar, and infrahilar adenopathy associated with a masslike region of possible consolidation in the right middle lobe, surrounding nodularity, and some scattered bilateral pulmonary nodules.   08/06/2014 - 12/10/2014 Chemotherapy FOLFOX.  Avastin added for cycle 2.  S/P 10 cycles.  Change to maintenance therapy secondary to positive response to therapy, increasing PN, and progressive proteinuria.   10/08/2014 Adverse Reaction Avastin induced proteinuria >/= 2 gm Avastin on hold   10/15/2014 Treatment Plan Change Avastin held for proteinuria.   10/22/2014 Imaging Ct CAP- 1. Interval decrease and soft tissue adjacent to the mid sigmoid colon with decreasing sigmoid colon wall thickening. The  leocolic, mesenteric, perirectal, and mesocolonic lymphadenopathy has decreased in the interval.   10/30/2014 Survivorship Genetic Counseling. Genetic testing was normal, and did not reveal a deleterious mutation in these genes. The test report will be scanned into EPIC and will be located  under the Media tab.    11/26/2014 Treatment Plan Change Oxaliplatin reduced x 25% due to PN.   12/24/2014 Treatment Plan Change Change to maintanence therapy.   01/09/2015 - 03/26/2015 Chemotherapy Xeloda 2500 mg BID, 7 days on and 7 days off.   01/30/2015 Imaging CT in ED- Evidence of distal small bowel obstruction with transition point over the ileum in the right upper pelvis immediately adjacent/abutting the known malignant stricturing of the sigmoid colon. There is wall thickening of the sigmoid colon wi...   01/30/2015 - 01/31/2015 Hospital Admission SBO (small bowel obstruction)   03/26/2015 Treatment Plan Change Xeloda cost-prohibitive without financial help   04/15/2015 -  Chemotherapy 5FU/Leucovorin days 1-5 every 28 days   05/13/2015 Adverse Reaction Palmar-Plantar erythrodysesthesia   05/13/2015 Treatment Plan Change 5FU/Leucovorin held   05/28/2015 Treatment Plan Change 5FU dose reduced by 20%   07/03/2015 Imaging CT CAP- Resolution right-sided pulmonary nodules and decrease in size of small mediastinal nodes. Response to therapy of hepatic metastasis. Similar to slight increase in suspicious nodes in the high mesorectum.   10/29/2015 Imaging CT CAP- Further reduction in size of the hepatic metastatic tumors, compatible with interval effective therapy. 2 mm left upper lobe pulmonary nodule has been stable over the past 15 months. Likely benign, may merit surveillance.     CURRENT THERAPY:  5-FU  INTERVAL HISTORY: Tom [name] 50 y.o. 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  Last CEA values have been WNL.  H  Tom [name] is unaccompanied. He is here for Cycle #9 5FU / Leucovorin. I personally reviewed and discussed imaging results with the patient  from 5/30 as he had multiple questions.   Explains he is feeling the "same old same old".  His back currently hurts which he attributes to  being on the lawn mower yesterday. He has been "eating way too well", noting weight gain. He is sleeping well. He complains of intermittent abdominal pain.   He has been taking 300 mg Colace in the morning and at night, as the 100 mg dose did not do much. Notes this morning he had to strain a bit with his bowel movement because he missed taking his stool softener. He has Miralax, as well.   The week after treatment he experiences diarrhea for two to three days. While experiencing diarrhea, he takes imodium and discontinues stool softener medication. The diarrhea is well controlled with imodium. He notes that at times he is reluctant to take it because he then develops constipation.   No nausea or vomiting. No other major complaints or concerns.    Past Medical History  Diagnosis Date  . 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     has No Known Allergies.  Tom [name] had no medications administered during this visit.  Past Surgical History  Procedure Laterality Date  . Esophagogastroduodenoscopy N/A 07/30/2014    Procedure: ESOPHAGOGASTRODUODENOSCOPY (EGD);  Surgeon: Inda Castle, MD;  Location: [facility];  Service: Endoscopy;  Laterality: N/A;  . Colonoscopy N/A 07/30/2014    Procedure: COLONOSCOPY;  Surgeon: Inda Castle, MD;  Location: [facility];  Service: Endoscopy;  Laterality: N/A;  . Portacath placement Right 08/01/14  . Portacath placement N/A 08/01/2014    Procedure: INSERTION RIGHT IJ PORT-A-CATH WITH ULTRASOUND AND FLUORO;  Surgeon: Gayland Curry, MD;  Location: [facility] ORS;  Service: General;  Laterality: N/A;  . Operative ultrasound N/A 08/01/2014    Procedure: OPERATIVE ULTRASOUND;  Surgeon: Gayland Curry, MD;  Location: [facility];  Service: General;  Laterality: N/A;    Denies any headaches, dizziness, double vision, fevers, chills, night sweats, nausea, vomiting, diarrhea, chest pain, heart palpitations, shortness of breath, blood in stool, black tarry stool, urinary pain, urinary burning, urinary frequency, hematuria. Positive for back pain.     Managed with diazepam. Positive for chest pain.     Chest pain after eating that lasts 15 to 20 minutes Positive for constipation and diarrhea.     Constipation managed with Colace and Miralax. Diarrhea for 2 to 3 days the week after treatment, managed with imodium. Positive for abdominal pain.     Intermittent abdominal pain Positive for tingling.     Neuropathy in fingertips and bottom of feet 14 point review of systems was performed and is negative except as detailed under history of present illness and above   PHYSICAL EXAMINATION  Vitals with BMI 12/09/2015  Height   Weight 223 lbs  BMI   Systolic 696  Diastolic 75  Pulse 59  Respirations 16    PERFORMANCE STATUS: 0 - Asymptomatic  GENERAL:alert, no distress, well nourished, well developed, comfortable, cooperative and smiling. Wears glasses.  SKIN: skin color, texture, turgor are normal, Rash on LLE almost gone HEAD: Normocephalic, No masses, lesions, tenderness or  abnormalities EYES: normal, PERRLA, EOMI, Conjunctiva are pink and non-injected EARS: External ears normal OROPHARYNX:lips, buccal mucosa, and tongue normal and mucous membranes are moist  NECK: supple, trachea midline LYMPH:  No palpable adenopathy in the neck or supraclavicular regions, no axillary adenopathy BREAST:not examined LUNGS: Air to auscultation bilaterally with no wheezing or rhonchi HEART: S1 and S2 audible, regular, no  ectopy ABDOMEN:abdomen soft and normal bowel sounds. No palpable liver edge. Ostomy site intact BACK: Back symmetric, no curvature. EXTREMITIES:less then 2 second capillary refill, no joint deformities, effusion, or inflammation, no cyanosis NEURO: alert & oriented x 3 with fluent speech, no focal motor/sensory deficits, gait normal   LABORATORY DATA: I have reviewed the data below as listed.  CBC    Component Value Date/Time   WBC 5.7 12/09/2015 0850   RBC 4.43 12/09/2015 0850   RBC 3.42* 07/28/2014 1440   HGB 13.2 12/09/2015 0850   HCT 38.5* 12/09/2015 0850   PLT 190 12/09/2015 0850   MCV 86.9 12/09/2015 0850   MCH 29.8 12/09/2015 0850   MCHC 34.3 12/09/2015 0850   RDW 14.6 12/09/2015 0850   LYMPHSABS 1.9 12/09/2015 0850   MONOABS 0.5 12/09/2015 0850   EOSABS 0.2 12/09/2015 0850   BASOSABS 0.0 12/09/2015 0850   CMP     Component Value Date/Time   NA 136 12/09/2015 0850   K 3.8 12/09/2015 0850   CL 107 12/09/2015 0850   CO2 24 12/09/2015 0850   GLUCOSE 135* 12/09/2015 0850   BUN 15 12/09/2015 0850   CREATININE 0.97 12/09/2015 0850   CALCIUM 8.6* 12/09/2015 0850   PROT 6.9 12/09/2015 0850   ALBUMIN 3.9 12/09/2015 0850   AST 28 12/09/2015 0850   ALT 24 12/09/2015 0850   ALKPHOS 87 12/09/2015 0850   BILITOT 0.4 12/09/2015 0850   GFRNONAA >60 12/09/2015 0850   GFRAA >60 12/09/2015 0850   Results for Tom [name], Tom [name] (MRN [id number])   Ref. Range 07/22/2015 09:40 08/19/2015 10:14 09/16/2015 10:45 10/14/2015 10:00 12/09/2015 08:50  CEA Latest Ref Range: 0.0-4.7 ng/mL 1.8 1.7 2.0 1.9 2.5     RADIOLOGY: I have personally reviewed the radiological images as listed and agreed with the findings in the report.  Study Result     CLINICAL DATA: Colon cancer with liver metastatic disease. Ongoing chemotherapy.  EXAM: CT CHEST, ABDOMEN, AND PELVIS WITH CONTRAST  TECHNIQUE: Multidetector CT imaging of the chest, abdomen and pelvis was performed following the  standard protocol during bolus administration of intravenous contrast.  CONTRAST: 172m ISOVUE-300 IOPAMIDOL (ISOVUE-300) INJECTION 61%  COMPARISON: Multiple exams, including 07/03/2015  FINDINGS: CT CHEST FINDINGS  Mediastinum/Nodes: Mild atherosclerotic calcification of the aortic arch. Port-A-Cath tip terminates at the upper cavoatrial junction.  Lungs/Pleura: 2 mm left upper lobe pulmonary nodule on image 54/10, stable, and also visible on image 23 of series 5 of the prior exam from 07/31/2014. This indicates 15 months stability of this lesion which is likely benign.  Musculoskeletal: Unremarkable  CT ABDOMEN PELVIS FINDINGS  Hepatobiliary: Further reduction in size and conspicuity of the hepatic metastatic lesions identified. A right hepatic lobe lesion on image 19/ 2 measures 2.0 by 2.0 cm, formerly 3.1 by 2.9 cm by my measurements. The other lesions are proportionally reduced in volume as well. No new hepatic metastatic lesions are identified. Gallbladder unremarkable.  Pancreas: Unremarkable  Spleen: Unremarkable  Adrenals/Urinary Tract: Adrenal glands normal. Several left renal cysts are present along with several hypodense lesions of the left kidney which are technically too small  to characterize but likely cysts. A left kidney upper pole cystic lesion measuring 3.1 by 2.4 cm appears to have some faint dependent density suggesting a complex cyst.  Stomach/Bowel: Mild perirectal stranding. As before, there are some perirectal lymph nodes including a 7 mm in short axis node posterior to the rectum on image 68/2 (formerly 8 mm). Along the inferior margin of the inferior mesenteric artery as it approaches the narrowing in the sigmoid colon there is some ill-defined soft tissue density on images 61-65 series 2, again very similar to prior, probably from scarring given the adhered appearance of the distal ileum to this region. There is some wall  thickening and narrowing in the rectosigmoid junction example image 65/2.  Vascular/Lymphatic: Minimal aortoiliac atherosclerotic vascular disease. Small perirectal lymph nodes as noted above.  Reproductive: Unremarkable  Other: No supplemental non-categorized findings.  Musculoskeletal: Lumbar spondylosis and degenerative disc disease.  IMPRESSION: 1. Further reduction in size of the hepatic metastatic tumors, compatible with interval effective therapy. 2. 2 mm left upper lobe pulmonary nodule has been stable over the past 15 months. Likely benign, may merit surveillance. 3. There continues to be some apparent scarring just above the rectosigmoid junction, with some tethering of a distal loop of ileum in this vicinity, and soft tissue density in this area extending along the inferior extent of the IMA. Tumor is a less likely cause for this appearance. However, there is some narrowing of the rectosigmoid junction in this region. Small perirectal lymph nodes are essentially stable from prior. 4. No new lesions are identified.   Electronically Signed  By: Van Clines M.D.  On: 10/29/2015 14:51    ASSESSMENT AND PLAN:  Stage IV colon cancer, KRAS WT BACK PAIN  50 year old male with stage IV colon cancer he has had a significant improvement in his disease and clinical status.He is currently on single agent 5-FU with excellent tolerance. His PS is excellent. CEA is WNL.  Unfortunately AVASTIN had to be discontinued secondary to persistent proteinuria.    Plan is for ongoing therapy with 5-FU. CT imaging was reviewed in detail. We will continue with ongoing therapy as planned. He will return to clinic for repeat PE, labs and ongoing therapy with his next cycle. I reviewed CT imaging from 5/30 with the patient again today in detail.  Genetics Counseling  He has completed genetics counseling. His genetic test results were normal--i.e. testing found no meaningful  changes to any of 19 genes associated with increased risks for colorectal and other types of cancers.  Nephrotic Range Proteinuria  He has been off of Avastin which is most commonly implicated in proteinuria since May 2. He has been seen by nephrology.  I have refilled his oxycodone. The patient was given an imodium care sheet.  He will return for follow up with Robynn Pane, PA-C in the next few weeks.  All questions were answered. The patient knows to call the clinic with any problems, questions or concerns. We can certainly see the patient much sooner if necessary.   This document serves as a record of services personally performed by Ancil Linsey, MD. It was created on her behalf by Arlyce Harman, a trained medical scribe. The creation of this record is based on the scribe's personal observations and the provider's statements to them. This document has been checked and approved by the attending provider.  I have reviewed the above documentation for accuracy and completeness, and I agree with the above.  This note was electronically signed.  Kelby Fam. Whitney Muse, MD

## 2015-12-09 NOTE — Progress Notes (Signed)
Tolerated chemo well. Ambulatory on discharge home to self. 

## 2015-12-09 NOTE — Patient Instructions (Signed)
[place] at [facility] Discharge Instructions  RECOMMENDATIONS MADE BY THE CONSULTANT AND ANY TEST RESULTS WILL BE SENT TO YOUR REFERRING PHYSICIAN.  Exam done and seen today by Dr. Whitney Muse Labs done Labs in 2 weeks Given imodium sheet. Return to see the doctor in 2 weeks with Tom Please call the clinic if you have any questions or concerns  Thank you for choosing [place] at [facility] to provide your oncology and hematology care.  To afford each patient quality time with our provider, please arrive at least 15 minutes before your scheduled appointment time.   Beginning January 23rd 2017 lab work for the [name] will be done in the  Main lab at [facility] on 1st floor. If you have a lab appointment with the [place] please come in thru the  Main Entrance and check in at the main information desk  You need to re-schedule your appointment should you arrive 10 or more minutes late.  We strive to give you quality time with our providers, and arriving late affects you and other patients whose appointments are after yours.  Also, if you no show three or more times for appointments you may be dismissed from the clinic at the providers discretion.     Again, thank you for choosing [facility].  Our hope is that these requests will decrease the amount of time that you wait before being seen by our physicians.       _____________________________________________________________  Should you have questions after your visit to [facility], please contact our office at [phone] between the hours of 8:30 a.m. and 4:30 p.m.  Voicemails left after 4:30 p.m. will not be returned until the following business day.  For prescription refill requests, have your pharmacy contact our office.         Resources For Cancer Patients and their Caregivers ? American Cancer Society: Can assist with transportation, wigs,  general needs, runs Look Good Feel Better.        2020378291 ? Cancer Care: Provides financial assistance, online support groups, medication/co-pay assistance.  1-800-813-HOPE 863 116 7421) ? [place] Assists [place] cancer patients and their families through emotional , educational and financial support.  [phone] [phone] ? [place] DSS Where to apply for food stamps, Medicaid and utility assistance. ([phone] 120-9271 ? RCATS: Transportation to medical appointments. 361-204-2666 ? Social Security Administration: May apply for disability if have a Stage IV cancer. 563-556-4343 4185336849 ? [employer], Disability and Transit Services: Assists with nutrition, care and transit needs. [place] Support Programs: @10RELATIVEDAYS @ > Cancer Support Group  2nd Tuesday of the month 1pm-2pm, Journey Room  > Creative Journey  3rd Tuesday of the month 1130am-1pm, Journey Room  > Look Good Feel Better  1st Wednesday of the month 10am-12 noon, Journey Room (Call [place] to register [phone])

## 2015-12-10 ENCOUNTER — Encounter (HOSPITAL_BASED_OUTPATIENT_CLINIC_OR_DEPARTMENT_OTHER): Payer: Medicaid Other

## 2015-12-10 ENCOUNTER — Ambulatory Visit (HOSPITAL_COMMUNITY): Payer: Self-pay | Admitting: Hematology & Oncology

## 2015-12-10 ENCOUNTER — Inpatient Hospital Stay (HOSPITAL_COMMUNITY): Payer: Self-pay

## 2015-12-10 VITALS — BP 108/59 | HR 54 | Temp 98.5°F | Resp 18 | Wt 225.0 lb

## 2015-12-10 DIAGNOSIS — C787 Secondary malignant neoplasm of liver and intrahepatic bile duct: Secondary | ICD-10-CM | POA: Diagnosis not present

## 2015-12-10 DIAGNOSIS — C189 Malignant neoplasm of colon, unspecified: Secondary | ICD-10-CM | POA: Diagnosis not present

## 2015-12-10 DIAGNOSIS — Z5111 Encounter for antineoplastic chemotherapy: Secondary | ICD-10-CM | POA: Diagnosis not present

## 2015-12-10 LAB — CEA: CEA: 2.5 ng/mL (ref 0.0–4.7)

## 2015-12-10 MED ORDER — LEUCOVORIN CALCIUM INJECTION 100 MG
20.0000 mg/m2 | Freq: Once | INTRAMUSCULAR | Status: AC
  Administered 2015-12-10: 42 mg via INTRAVENOUS
  Filled 2015-12-10: qty 2.1

## 2015-12-10 MED ORDER — ONDANSETRON HCL 40 MG/20ML IJ SOLN
8.0000 mg | Freq: Once | INTRAMUSCULAR | Status: AC
  Administered 2015-12-10: 8 mg via INTRAVENOUS
  Filled 2015-12-10: qty 4

## 2015-12-10 MED ORDER — SODIUM CHLORIDE 0.9 % IV SOLN
340.0000 mg/m2 | Freq: Once | INTRAVENOUS | Status: AC
  Administered 2015-12-10: 700 mg via INTRAVENOUS
  Filled 2015-12-10: qty 14

## 2015-12-10 MED ORDER — SODIUM CHLORIDE 0.9 % IV SOLN
Freq: Once | INTRAVENOUS | Status: AC
  Administered 2015-12-10: 10:00:00 via INTRAVENOUS

## 2015-12-10 MED ORDER — PROCHLORPERAZINE MALEATE 10 MG PO TABS
ORAL_TABLET | ORAL | Status: AC
  Filled 2015-12-10: qty 1

## 2015-12-10 MED ORDER — PROCHLORPERAZINE MALEATE 10 MG PO TABS
10.0000 mg | ORAL_TABLET | Freq: Once | ORAL | Status: AC
  Administered 2015-12-10: 10 mg via ORAL

## 2015-12-10 MED ORDER — HEPARIN SOD (PORK) LOCK FLUSH 100 UNIT/ML IV SOLN
500.0000 [IU] | Freq: Once | INTRAVENOUS | Status: AC | PRN
  Administered 2015-12-10: 500 [IU]

## 2015-12-10 MED ORDER — SODIUM CHLORIDE 0.9 % IJ SOLN: 10.0000 mL | INTRAMUSCULAR | Status: DC | PRN

## 2015-12-10 NOTE — Progress Notes (Signed)
0945:  Tolerated tx w/o adverse reaction.  A&Ox4, in no distress.  VSS.  Discharged ambulatory.

## 2015-12-10 NOTE — Patient Instructions (Signed)
[facility] Discharge Instructions for Patients Receiving Chemotherapy   Beginning January 23rd 2017 lab work for the [facility] will be done in the  Main lab at [facility] on 1st floor. If you have a lab appointment with the [place] please come in thru the  Main Entrance and check in at the main information desk   Today you received the following chemotherapy agents:  10fu and leucovorin  If you develop nausea and vomiting, or diarrhea that is not controlled by your medication, call the clinic.  The clinic phone number is [phone]. Office hours are Monday-Friday 8:30am-5:00pm.  BELOW ARE SYMPTOMS THAT SHOULD BE REPORTED IMMEDIATELY:  *FEVER GREATER THAN 101.0 F  *CHILLS WITH OR WITHOUT FEVER  NAUSEA AND VOMITING THAT IS NOT CONTROLLED WITH YOUR NAUSEA MEDICATION  *UNUSUAL SHORTNESS OF BREATH  *UNUSUAL BRUISING OR BLEEDING  TENDERNESS IN MOUTH AND THROAT WITH OR WITHOUT PRESENCE OF ULCERS  *URINARY PROBLEMS  *BOWEL PROBLEMS  UNUSUAL RASH Items with * indicate a potential emergency and should be followed up as soon as possible. If you have an emergency after office hours please contact your primary care physician or go to the nearest emergency department.  Please call the clinic during office hours if you have any questions or concerns.   You may also contact the Patient Navigator at [phone] should you have any questions or need assistance in obtaining follow up care.      Resources For Cancer Patients and their Caregivers ? American Cancer Society: Can assist with transportation, wigs, general needs, runs Look Good Feel Better.        281-800-9646 ? Cancer Care: Provides financial assistance, online support groups, medication/co-pay assistance.  1-800-813-HOPE 563-497-1511) ? [place] Assists [place] cancer patients and their families through emotional , educational and financial support.   916-554-7335 ? [place] DSS Where to apply for food stamps, Medicaid and utility assistance. ([phone] ? RCATS: Transportation to medical appointments. [phone] ? Social Security Administration: May apply for disability if have a Stage IV cancer. [phone] ? [employer], Disability and Transit Services: Assists with nutrition, care and transit needs. [phone]

## 2015-12-11 ENCOUNTER — Encounter (HOSPITAL_COMMUNITY): Payer: Self-pay

## 2015-12-11 ENCOUNTER — Inpatient Hospital Stay (HOSPITAL_COMMUNITY): Payer: Self-pay

## 2015-12-11 ENCOUNTER — Encounter (HOSPITAL_BASED_OUTPATIENT_CLINIC_OR_DEPARTMENT_OTHER): Payer: Medicaid Other

## 2015-12-11 VITALS — BP 107/60 | HR 66 | Temp 98.7°F | Resp 18 | Wt 227.0 lb

## 2015-12-11 DIAGNOSIS — Z5111 Encounter for antineoplastic chemotherapy: Secondary | ICD-10-CM

## 2015-12-11 DIAGNOSIS — C189 Malignant neoplasm of colon, unspecified: Secondary | ICD-10-CM | POA: Diagnosis not present

## 2015-12-11 DIAGNOSIS — C787 Secondary malignant neoplasm of liver and intrahepatic bile duct: Secondary | ICD-10-CM

## 2015-12-11 MED ORDER — LEUCOVORIN CALCIUM INJECTION 100 MG
20.0000 mg/m2 | Freq: Once | INTRAMUSCULAR | Status: AC
  Administered 2015-12-11: 42 mg via INTRAVENOUS
  Filled 2015-12-11: qty 2.1

## 2015-12-11 MED ORDER — SODIUM CHLORIDE 0.9 % IV SOLN
340.0000 mg/m2 | Freq: Once | INTRAVENOUS | Status: AC
  Administered 2015-12-11: 700 mg via INTRAVENOUS
  Filled 2015-12-11: qty 14

## 2015-12-11 MED ORDER — SODIUM CHLORIDE 0.9 % IV SOLN
8.0000 mg | Freq: Once | INTRAVENOUS | Status: AC
  Administered 2015-12-11: 8 mg via INTRAVENOUS
  Filled 2015-12-11: qty 4

## 2015-12-11 MED ORDER — HEPARIN SOD (PORK) LOCK FLUSH 100 UNIT/ML IV SOLN
500.0000 [IU] | Freq: Once | INTRAVENOUS | Status: AC | PRN
  Administered 2015-12-11: 500 [IU]
  Filled 2015-12-11: qty 5

## 2015-12-11 MED ORDER — SODIUM CHLORIDE 0.9 % IJ SOLN: 10.0000 mL | INTRAMUSCULAR | Status: DC | PRN

## 2015-12-11 MED ORDER — PROCHLORPERAZINE MALEATE 10 MG PO TABS
10.0000 mg | ORAL_TABLET | Freq: Once | ORAL | Status: AC
  Administered 2015-12-11: 10 mg via ORAL
  Filled 2015-12-11: qty 1

## 2015-12-11 MED ORDER — SODIUM CHLORIDE 0.9 % IV SOLN
Freq: Once | INTRAVENOUS | Status: AC
  Administered 2015-12-11: 10:00:00 via INTRAVENOUS

## 2015-12-11 NOTE — Patient Instructions (Signed)
[facility] Discharge Instructions for Patients Receiving Chemotherapy   Beginning January 23rd 2017 lab work for the [facility] will be done in the  Main lab at [place] on 1st floor. If you have a lab appointment with the [place] please come in thru the  Main Entrance and check in at the main information desk   Today you received the following chemotherapy agents leucovorin and 32fu Follow up as scheduled Please call the clinic if you have any questions or concerns   To help prevent nausea and vomiting after your treatment, we encourage you to take your nausea medication    If you develop nausea and vomiting, or diarrhea that is not controlled by your medication, call the clinic.  The clinic phone number is [phone]. Office hours are Monday-Friday 8:30am-5:00pm.  BELOW ARE SYMPTOMS THAT SHOULD BE REPORTED IMMEDIATELY:  *FEVER GREATER THAN 101.0 F  *CHILLS WITH OR WITHOUT FEVER  NAUSEA AND VOMITING THAT IS NOT CONTROLLED WITH YOUR NAUSEA MEDICATION  *UNUSUAL SHORTNESS OF BREATH  *UNUSUAL BRUISING OR BLEEDING  TENDERNESS IN MOUTH AND THROAT WITH OR WITHOUT PRESENCE OF ULCERS  *URINARY PROBLEMS  *BOWEL PROBLEMS  UNUSUAL RASH Items with * indicate a potential emergency and should be followed up as soon as possible. If you have an emergency after office hours please contact your primary care physician or go to the nearest emergency department.  Please call the clinic during office hours if you have any questions or concerns.   You may also contact the Patient Navigator at [phone] should you have any questions or need assistance in obtaining follow up care.      Resources For Cancer Patients and their Caregivers ? American Cancer Society: Can assist with transportation, wigs, general needs, runs Look Good Feel Better.        [phone] ? Cancer Care: Provides financial assistance, online support groups, medication/co-pay  assistance.  1-800-813-HOPE 319-801-6996) ? [place] Assists [place] cancer patients and their families through emotional , educational and financial support.  623-865-1067 ? [place] DSS Where to apply for food stamps, Medicaid and utility assistance. [phone] ? RCATS: Transportation to medical appointments. 531-639-0991 ? Social Security Administration: May apply for disability if have a Stage IV cancer. [phone] 903-639-0038 ? [employer], Disability and Transit Services: Assists with nutrition, care and transit needs. [phone]

## 2015-12-11 NOTE — Progress Notes (Signed)
Tom [name]  Tolerated chemotherapy well Discharged ambulatory

## 2015-12-12 ENCOUNTER — Inpatient Hospital Stay (HOSPITAL_COMMUNITY): Payer: Self-pay

## 2015-12-12 ENCOUNTER — Encounter (HOSPITAL_BASED_OUTPATIENT_CLINIC_OR_DEPARTMENT_OTHER): Payer: Medicaid Other

## 2015-12-12 VITALS — BP 115/67 | HR 57 | Temp 98.6°F | Resp 20 | Wt 225.2 lb

## 2015-12-12 DIAGNOSIS — Z5111 Encounter for antineoplastic chemotherapy: Secondary | ICD-10-CM | POA: Diagnosis not present

## 2015-12-12 DIAGNOSIS — C189 Malignant neoplasm of colon, unspecified: Secondary | ICD-10-CM

## 2015-12-12 DIAGNOSIS — C787 Secondary malignant neoplasm of liver and intrahepatic bile duct: Secondary | ICD-10-CM

## 2015-12-12 MED ORDER — SODIUM CHLORIDE 0.9 % IV SOLN
Freq: Once | INTRAVENOUS | Status: AC
  Administered 2015-12-12: 09:00:00 via INTRAVENOUS

## 2015-12-12 MED ORDER — HEPARIN SOD (PORK) LOCK FLUSH 100 UNIT/ML IV SOLN
500.0000 [IU] | Freq: Once | INTRAVENOUS | Status: AC | PRN
  Administered 2015-12-12: 500 [IU]

## 2015-12-12 MED ORDER — SODIUM CHLORIDE 0.9 % IJ SOLN: 10.0000 mL | INTRAMUSCULAR | Status: DC | PRN

## 2015-12-12 MED ORDER — SODIUM CHLORIDE 0.9 % IV SOLN
340.0000 mg/m2 | Freq: Once | INTRAVENOUS | Status: AC
  Administered 2015-12-12: 700 mg via INTRAVENOUS
  Filled 2015-12-12: qty 14

## 2015-12-12 MED ORDER — LEUCOVORIN CALCIUM INJECTION 100 MG
20.0000 mg/m2 | Freq: Once | INTRAMUSCULAR | Status: AC
Start: 2015-12-12 — End: 2015-12-12
  Administered 2015-12-12: 42 mg via INTRAVENOUS
  Filled 2015-12-12: qty 2.1

## 2015-12-12 MED ORDER — PROCHLORPERAZINE MALEATE 10 MG PO TABS
10.0000 mg | ORAL_TABLET | Freq: Once | ORAL | Status: AC
  Administered 2015-12-12: 10 mg via ORAL
  Filled 2015-12-12: qty 1

## 2015-12-12 MED ORDER — ONDANSETRON HCL 40 MG/20ML IJ SOLN
8.0000 mg | Freq: Once | INTRAMUSCULAR | Status: AC
  Administered 2015-12-12: 8 mg via INTRAVENOUS
  Filled 2015-12-12: qty 4

## 2015-12-12 NOTE — Progress Notes (Signed)
Tolerated infusion without any problems. 

## 2015-12-13 ENCOUNTER — Encounter (HOSPITAL_COMMUNITY): Payer: Self-pay

## 2015-12-13 ENCOUNTER — Encounter (HOSPITAL_BASED_OUTPATIENT_CLINIC_OR_DEPARTMENT_OTHER): Payer: Medicaid Other

## 2015-12-13 ENCOUNTER — Inpatient Hospital Stay (HOSPITAL_COMMUNITY): Payer: Self-pay

## 2015-12-13 VITALS — BP 118/72 | HR 69 | Temp 98.2°F | Resp 20 | Wt 224.0 lb

## 2015-12-13 DIAGNOSIS — C189 Malignant neoplasm of colon, unspecified: Secondary | ICD-10-CM | POA: Diagnosis not present

## 2015-12-13 DIAGNOSIS — C787 Secondary malignant neoplasm of liver and intrahepatic bile duct: Secondary | ICD-10-CM | POA: Diagnosis not present

## 2015-12-13 DIAGNOSIS — Z5111 Encounter for antineoplastic chemotherapy: Secondary | ICD-10-CM | POA: Diagnosis present

## 2015-12-13 MED ORDER — HEPARIN SOD (PORK) LOCK FLUSH 100 UNIT/ML IV SOLN
500.0000 [IU] | Freq: Once | INTRAVENOUS | Status: AC | PRN
  Administered 2015-12-13: 500 [IU]
  Filled 2015-12-13: qty 5

## 2015-12-13 MED ORDER — SODIUM CHLORIDE 0.9 % IJ SOLN: 10.0000 mL | INTRAMUSCULAR | Status: DC | PRN

## 2015-12-13 MED ORDER — SODIUM CHLORIDE 0.9 % IV SOLN
8.0000 mg | Freq: Once | INTRAVENOUS | Status: AC
  Administered 2015-12-13: 8 mg via INTRAVENOUS
  Filled 2015-12-13: qty 4

## 2015-12-13 MED ORDER — SODIUM CHLORIDE 0.9 % IV SOLN
Freq: Once | INTRAVENOUS | Status: AC
  Administered 2015-12-13: 08:00:00 via INTRAVENOUS

## 2015-12-13 MED ORDER — SODIUM CHLORIDE 0.9 % IV SOLN
340.0000 mg/m2 | Freq: Once | INTRAVENOUS | Status: AC
  Administered 2015-12-13: 700 mg via INTRAVENOUS
  Filled 2015-12-13: qty 14

## 2015-12-13 MED ORDER — PROCHLORPERAZINE MALEATE 10 MG PO TABS
10.0000 mg | ORAL_TABLET | Freq: Once | ORAL | Status: AC
  Administered 2015-12-13: 10 mg via ORAL
  Filled 2015-12-13: qty 1

## 2015-12-13 MED ORDER — LEUCOVORIN CALCIUM INJECTION 100 MG
20.0000 mg/m2 | Freq: Once | INTRAMUSCULAR | Status: AC
  Administered 2015-12-13: 42 mg via INTRAVENOUS
  Filled 2015-12-13: qty 2.1

## 2015-12-13 NOTE — Progress Notes (Signed)
Tom [name] Tolerated chemotherapy well  Discharged ambulatory

## 2015-12-13 NOTE — Patient Instructions (Signed)
[facility] Discharge Instructions for Patients Receiving Chemotherapy   Beginning January 23rd 2017 lab work for the [facility] will be done in the  Main lab at [facility] on 1st floor. If you have a lab appointment with the [place] please come in thru the  Main Entrance and check in at the main information desk   Today you received the following chemotherapy agents 22fu and lecovorin  Follow up as scheduled Please call the clinic if you have any questions or concerns   To help prevent nausea and vomiting after your treatment, we encourage you to take your nausea medication    If you develop nausea and vomiting, or diarrhea that is not controlled by your medication, call the clinic.  The clinic phone number is [phone]. Office hours are Monday-Friday 8:30am-5:00pm.  BELOW ARE SYMPTOMS THAT SHOULD BE REPORTED IMMEDIATELY:  *FEVER GREATER THAN 101.0 F  *CHILLS WITH OR WITHOUT FEVER  NAUSEA AND VOMITING THAT IS NOT CONTROLLED WITH YOUR NAUSEA MEDICATION  *UNUSUAL SHORTNESS OF BREATH  *UNUSUAL BRUISING OR BLEEDING  TENDERNESS IN MOUTH AND THROAT WITH OR WITHOUT PRESENCE OF ULCERS  *URINARY PROBLEMS  *BOWEL PROBLEMS  UNUSUAL RASH Items with * indicate a potential emergency and should be followed up as soon as possible. If you have an emergency after office hours please contact your primary care physician or go to the nearest emergency department.  Please call the clinic during office hours if you have any questions or concerns.   You may also contact the Patient Navigator at [phone] should you have any questions or need assistance in obtaining follow up care.      Resources For Cancer Patients and their Caregivers ? American Cancer Society: Can assist with transportation, wigs, general needs, runs Look Good Feel Better.        [phone] ? Cancer Care: Provides financial assistance, online support groups, medication/co-pay  assistance.  1-800-813-HOPE (570) 745-0925) ? [place] Assists [place] cancer patients and their families through emotional , educational and financial support.  9124063884 ? [place] DSS Where to apply for food stamps, Medicaid and utility assistance. ([phone] ? RCATS: Transportation to medical appointments. [phone] ? Social Security Administration: May apply for disability if have a Stage IV cancer. [phone] ? [employer], Disability and Transit Services: Assists with nutrition, care and transit needs. [phone]

## 2015-12-16 ENCOUNTER — Encounter (HOSPITAL_COMMUNITY): Payer: Self-pay | Admitting: Hematology & Oncology

## 2015-12-23 ENCOUNTER — Ambulatory Visit (HOSPITAL_COMMUNITY): Payer: Self-pay | Admitting: Oncology

## 2016-01-06 ENCOUNTER — Encounter (HOSPITAL_COMMUNITY): Payer: Self-pay | Attending: Hematology & Oncology

## 2016-01-06 VITALS — BP 97/60 | HR 52 | Temp 98.4°F | Resp 18 | Wt 222.6 lb

## 2016-01-06 DIAGNOSIS — C189 Malignant neoplasm of colon, unspecified: Secondary | ICD-10-CM

## 2016-01-06 DIAGNOSIS — Z5111 Encounter for antineoplastic chemotherapy: Secondary | ICD-10-CM

## 2016-01-06 DIAGNOSIS — C787 Secondary malignant neoplasm of liver and intrahepatic bile duct: Secondary | ICD-10-CM | POA: Insufficient documentation

## 2016-01-06 DIAGNOSIS — C187 Malignant neoplasm of sigmoid colon: Secondary | ICD-10-CM | POA: Insufficient documentation

## 2016-01-06 LAB — COMPREHENSIVE METABOLIC PANEL
ALT: 23 U/L (ref 17–63)
AST: 24 U/L (ref 15–41)
Albumin: 4 g/dL (ref 3.5–5.0)
Alkaline Phosphatase: 84 U/L (ref 38–126)
Anion gap: 6 (ref 5–15)
BUN: 17 mg/dL (ref 6–20)
CHLORIDE: 106 mmol/L (ref 101–111)
CO2: 23 mmol/L (ref 22–32)
CREATININE: 0.95 mg/dL (ref 0.61–1.24)
Calcium: 8.2 mg/dL — ABNORMAL LOW (ref 8.9–10.3)
Glucose, Bld: 154 mg/dL — ABNORMAL HIGH (ref 65–99)
POTASSIUM: 3.8 mmol/L (ref 3.5–5.1)
SODIUM: 135 mmol/L (ref 135–145)
Total Bilirubin: 0.5 mg/dL (ref 0.3–1.2)
Total Protein: 7 g/dL (ref 6.5–8.1)

## 2016-01-06 LAB — CBC WITH DIFFERENTIAL/PLATELET
BASOS ABS: 0 10*3/uL (ref 0.0–0.1)
Basophils Relative: 0 %
EOS ABS: 0.2 10*3/uL (ref 0.0–0.7)
EOS PCT: 3 %
HCT: 39.9 % (ref 39.0–52.0)
Hemoglobin: 13.6 g/dL (ref 13.0–17.0)
Lymphocytes Relative: 31 %
Lymphs Abs: 2.1 10*3/uL (ref 0.7–4.0)
MCH: 29.6 pg (ref 26.0–34.0)
MCHC: 34.1 g/dL (ref 30.0–36.0)
MCV: 86.9 fL (ref 78.0–100.0)
MONO ABS: 0.5 10*3/uL (ref 0.1–1.0)
Monocytes Relative: 7 %
Neutro Abs: 4.1 10*3/uL (ref 1.7–7.7)
Neutrophils Relative %: 59 %
PLATELETS: 191 10*3/uL (ref 150–400)
RBC: 4.59 MIL/uL (ref 4.22–5.81)
RDW: 14.8 % (ref 11.5–15.5)
WBC: 6.8 10*3/uL (ref 4.0–10.5)

## 2016-01-06 MED ORDER — SODIUM CHLORIDE 0.9 % IV SOLN
Freq: Once | INTRAVENOUS | Status: AC
  Administered 2016-01-06: 11:00:00 via INTRAVENOUS

## 2016-01-06 MED ORDER — SODIUM CHLORIDE 0.9 % IV SOLN
8.0000 mg | Freq: Once | INTRAVENOUS | Status: AC
  Administered 2016-01-06: 8 mg via INTRAVENOUS
  Filled 2016-01-06: qty 4

## 2016-01-06 MED ORDER — LEUCOVORIN CALCIUM INJECTION 100 MG
20.0000 mg/m2 | Freq: Once | INTRAMUSCULAR | Status: AC
  Administered 2016-01-06: 42 mg via INTRAVENOUS
  Filled 2016-01-06: qty 2.1

## 2016-01-06 MED ORDER — SODIUM CHLORIDE 0.9 % IJ SOLN: 10.0000 mL | INTRAMUSCULAR | Status: DC | PRN

## 2016-01-06 MED ORDER — HEPARIN SOD (PORK) LOCK FLUSH 100 UNIT/ML IV SOLN
500.0000 [IU] | Freq: Once | INTRAVENOUS | Status: AC | PRN
  Administered 2016-01-06: 500 [IU]
  Filled 2016-01-06: qty 5

## 2016-01-06 MED ORDER — SODIUM CHLORIDE 0.9 % IV SOLN
340.0000 mg/m2 | Freq: Once | INTRAVENOUS | Status: AC
  Administered 2016-01-06: 700 mg via INTRAVENOUS
  Filled 2016-01-06: qty 14

## 2016-01-06 MED ORDER — PROCHLORPERAZINE MALEATE 10 MG PO TABS
10.0000 mg | ORAL_TABLET | Freq: Once | ORAL | Status: AC
  Administered 2016-01-06: 10 mg via ORAL

## 2016-01-06 MED ORDER — PROCHLORPERAZINE MALEATE 10 MG PO TABS
ORAL_TABLET | ORAL | Status: AC
  Filled 2016-01-06: qty 1

## 2016-01-06 NOTE — Patient Instructions (Signed)
[facility] Discharge Instructions for Patients Receiving Chemotherapy   Beginning January 23rd 2017 lab work for the [facility] will be done in the  Main lab at [facility] on 1st floor. If you have a lab appointment with the [place] please come in thru the  Main Entrance and check in at the main information desk   Today you received the following chemotherapy agents: leucovorin and fluorouracil.     If you develop nausea and vomiting, or diarrhea that is not controlled by your medication, call the clinic.  The clinic phone number is [phone]. Office hours are Monday-Friday 8:30am-5:00pm.  BELOW ARE SYMPTOMS THAT SHOULD BE REPORTED IMMEDIATELY:  *FEVER GREATER THAN 101.0 F  *CHILLS WITH OR WITHOUT FEVER  NAUSEA AND VOMITING THAT IS NOT CONTROLLED WITH YOUR NAUSEA MEDICATION  *UNUSUAL SHORTNESS OF BREATH  *UNUSUAL BRUISING OR BLEEDING  TENDERNESS IN MOUTH AND THROAT WITH OR WITHOUT PRESENCE OF ULCERS  *URINARY PROBLEMS  *BOWEL PROBLEMS  UNUSUAL RASH Items with * indicate a potential emergency and should be followed up as soon as possible. If you have an emergency after office hours please contact your primary care physician or go to the nearest emergency department.  Please call the clinic during office hours if you have any questions or concerns.   You may also contact the Patient Navigator at [phone] should you have any questions or need assistance in obtaining follow up care.      Resources For Cancer Patients and their Caregivers ? American Cancer Society: Can assist with transportation, wigs, general needs, runs Look Good Feel Better.        519-484-3085 ? Cancer Care: Provides financial assistance, online support groups, medication/co-pay assistance.  1-800-813-HOPE 202-232-0250) ? [place] Assists [place] cancer patients and their families through emotional , educational and financial support.   3865345505 ? [place] DSS Where to apply for food stamps, Medicaid and utility assistance. [phone] ? RCATS: Transportation to medical appointments. 520-031-9821 ? Social Security Administration: May apply for disability if have a Stage IV cancer. [phone] ? [employer], Disability and Transit Services: Assists with nutrition, care and transit needs. [phone]

## 2016-01-06 NOTE — Progress Notes (Signed)
Patient tolerated infusion well.  VSS.   

## 2016-01-07 ENCOUNTER — Encounter (HOSPITAL_BASED_OUTPATIENT_CLINIC_OR_DEPARTMENT_OTHER): Payer: Medicaid Other

## 2016-01-07 VITALS — BP 120/60 | HR 60 | Temp 98.2°F | Resp 18 | Wt 219.4 lb

## 2016-01-07 DIAGNOSIS — C787 Secondary malignant neoplasm of liver and intrahepatic bile duct: Secondary | ICD-10-CM | POA: Diagnosis not present

## 2016-01-07 DIAGNOSIS — Z5111 Encounter for antineoplastic chemotherapy: Secondary | ICD-10-CM | POA: Diagnosis present

## 2016-01-07 DIAGNOSIS — C189 Malignant neoplasm of colon, unspecified: Secondary | ICD-10-CM

## 2016-01-07 LAB — CEA: CEA: 2.4 ng/mL (ref 0.0–4.7)

## 2016-01-07 MED ORDER — LEUCOVORIN CALCIUM INJECTION 100 MG
20.0000 mg/m2 | Freq: Once | INTRAMUSCULAR | Status: AC
  Administered 2016-01-07: 42 mg via INTRAVENOUS
  Filled 2016-01-07: qty 2.1

## 2016-01-07 MED ORDER — PROCHLORPERAZINE MALEATE 10 MG PO TABS
ORAL_TABLET | ORAL | Status: AC
  Filled 2016-01-07: qty 1

## 2016-01-07 MED ORDER — HEPARIN SOD (PORK) LOCK FLUSH 100 UNIT/ML IV SOLN
INTRAVENOUS | Status: AC
  Filled 2016-01-07: qty 5

## 2016-01-07 MED ORDER — SODIUM CHLORIDE 0.9 % IJ SOLN
10.0000 mL | INTRAMUSCULAR | Status: DC | PRN
  Administered 2016-01-07: 10 mL
  Filled 2016-01-07: qty 10

## 2016-01-07 MED ORDER — SODIUM CHLORIDE 0.9 % IV SOLN
Freq: Once | INTRAVENOUS | Status: AC
  Administered 2016-01-07: 10:00:00 via INTRAVENOUS

## 2016-01-07 MED ORDER — ONDANSETRON HCL 40 MG/20ML IJ SOLN
8.0000 mg | Freq: Once | INTRAMUSCULAR | Status: AC
  Administered 2016-01-07: 8 mg via INTRAVENOUS
  Filled 2016-01-07: qty 4

## 2016-01-07 MED ORDER — HEPARIN SOD (PORK) LOCK FLUSH 100 UNIT/ML IV SOLN
500.0000 [IU] | Freq: Once | INTRAVENOUS | Status: AC | PRN
  Administered 2016-01-07: 500 [IU]
  Filled 2016-01-07: qty 5

## 2016-01-07 MED ORDER — SODIUM CHLORIDE 0.9 % IV SOLN
340.0000 mg/m2 | Freq: Once | INTRAVENOUS | Status: AC
  Administered 2016-01-07: 700 mg via INTRAVENOUS
  Filled 2016-01-07: qty 14

## 2016-01-07 MED ORDER — PROCHLORPERAZINE MALEATE 10 MG PO TABS
10.0000 mg | ORAL_TABLET | Freq: Once | ORAL | Status: AC
  Administered 2016-01-07: 10 mg via ORAL

## 2016-01-07 NOTE — Patient Instructions (Signed)
[facility] Discharge Instructions for Patients Receiving Chemotherapy   Beginning January 23rd 2017 lab work for the Cancer Center will be done in the  Main lab at [place] on 1st floor. If you have a lab appointment with the Cancer Center please come in thru the  Main Entrance and check in at the main information desk   Today you received the following chemotherapy agents Leucovorin and 5FU. Follow-up as scheduled. Call clinic for any questions or concerns  To help prevent nausea and vomiting after your treatment, we encourage you to take your nausea medication   If you develop nausea and vomiting, or diarrhea that is not controlled by your medication, call the clinic.  The clinic phone number is [phone]. Office hours are Monday-Friday 8:30am-5:00pm.  BELOW ARE SYMPTOMS THAT SHOULD BE REPORTED IMMEDIATELY:  *FEVER GREATER THAN 101.0 F  *CHILLS WITH OR WITHOUT FEVER  NAUSEA AND VOMITING THAT IS NOT CONTROLLED WITH YOUR NAUSEA MEDICATION  *UNUSUAL SHORTNESS OF BREATH  *UNUSUAL BRUISING OR BLEEDING  TENDERNESS IN MOUTH AND THROAT WITH OR WITHOUT PRESENCE OF ULCERS  *URINARY PROBLEMS  *BOWEL PROBLEMS  UNUSUAL RASH Items with * indicate a potential emergency and should be followed up as soon as possible. If you have an emergency after office hours please contact your primary care physician or go to the nearest emergency department.  Please call the clinic during office hours if you have any questions or concerns.   You may also contact the Patient Navigator at [phone] should you have any questions or need assistance in obtaining follow up care.      Resources For Cancer Patients and their Caregivers ? American Cancer Society: Can assist with transportation, wigs, general needs, runs Look Good Feel Better.        1-888-227-6333 ? Cancer Care: Provides financial assistance, online support groups, medication/co-pay assistance.   1-800-813-HOPE (4673) ? [facility] Assists [place] cancer patients and their families through emotional , educational and financial support.  336-427-4357 ? [place] DSS Where to apply for food stamps, Medicaid and utility assistance. 336-342-1394 ? RCATS: Transportation to medical appointments. [phone] ? Social Security Administration: May apply for disability if have a Stage IV cancer. [phone] 1-800-772-1213 ? [place] A[employer], Disability and Transit Services: Assists with nutrition, care and transit needs. [phone]         

## 2016-01-07 NOTE — Progress Notes (Signed)
[name] tolerated chemo tx well without complaints. Pt discharged with port left accessed for chemo tomorrow. Pt discharged self ambulatory in satisfactory condition with daughter

## 2016-01-08 ENCOUNTER — Ambulatory Visit (HOSPITAL_COMMUNITY): Payer: Self-pay | Admitting: Oncology

## 2016-01-08 ENCOUNTER — Encounter (HOSPITAL_BASED_OUTPATIENT_CLINIC_OR_DEPARTMENT_OTHER): Payer: Medicaid Other

## 2016-01-08 VITALS — BP 119/61 | HR 52 | Temp 98.2°F | Resp 18 | Wt 220.0 lb

## 2016-01-08 DIAGNOSIS — C787 Secondary malignant neoplasm of liver and intrahepatic bile duct: Secondary | ICD-10-CM

## 2016-01-08 DIAGNOSIS — C189 Malignant neoplasm of colon, unspecified: Secondary | ICD-10-CM

## 2016-01-08 DIAGNOSIS — Z5111 Encounter for antineoplastic chemotherapy: Secondary | ICD-10-CM | POA: Diagnosis not present

## 2016-01-08 MED ORDER — SODIUM CHLORIDE 0.9 % IV SOLN
Freq: Once | INTRAVENOUS | Status: AC
  Administered 2016-01-08: 10:00:00 via INTRAVENOUS

## 2016-01-08 MED ORDER — PROCHLORPERAZINE MALEATE 10 MG PO TABS
10.0000 mg | ORAL_TABLET | Freq: Once | ORAL | Status: AC
  Administered 2016-01-08: 10 mg via ORAL
  Filled 2016-01-08: qty 1

## 2016-01-08 MED ORDER — LEUCOVORIN CALCIUM INJECTION 100 MG
20.0000 mg/m2 | Freq: Once | INTRAMUSCULAR | Status: AC
  Administered 2016-01-08: 42 mg via INTRAVENOUS
  Filled 2016-01-08: qty 2.1

## 2016-01-08 MED ORDER — SODIUM CHLORIDE 0.9 % IV SOLN
340.0000 mg/m2 | Freq: Once | INTRAVENOUS | Status: AC
  Administered 2016-01-08: 700 mg via INTRAVENOUS
  Filled 2016-01-08: qty 14

## 2016-01-08 MED ORDER — HEPARIN SOD (PORK) LOCK FLUSH 100 UNIT/ML IV SOLN
500.0000 [IU] | Freq: Once | INTRAVENOUS | Status: AC | PRN
  Administered 2016-01-08: 500 [IU]
  Filled 2016-01-08: qty 5

## 2016-01-08 MED ORDER — SODIUM CHLORIDE 0.9 % IV SOLN
8.0000 mg | Freq: Once | INTRAVENOUS | Status: AC
  Administered 2016-01-08: 8 mg via INTRAVENOUS
  Filled 2016-01-08: qty 4

## 2016-01-08 MED ORDER — SODIUM CHLORIDE 0.9 % IJ SOLN
10.0000 mL | INTRAMUSCULAR | Status: DC | PRN
  Administered 2016-01-08: 10 mL
  Filled 2016-01-08: qty 10

## 2016-01-08 NOTE — Patient Instructions (Signed)
[facility] Discharge Instructions for Patients Receiving Chemotherapy   Beginning January 23rd 2017 lab work for the [facility] will be done in the  Main lab at [facility] on 1st floor. If you have a lab appointment with the [place] please come in thru the  Main Entrance and check in at the main information desk   Today you received the following chemotherapy agents Leucovorin and 5FU. Follow-up as scheduled. Call clinic for any questions and concerns  To help prevent nausea and vomiting after your treatment, we encourage you to take your nausea medication   If you develop nausea and vomiting, or diarrhea that is not controlled by your medication, call the clinic.  The clinic phone number is [phone]. Office hours are Monday-Friday 8:30am-5:00pm.  BELOW ARE SYMPTOMS THAT SHOULD BE REPORTED IMMEDIATELY:  *FEVER GREATER THAN 101.0 F  *CHILLS WITH OR WITHOUT FEVER  NAUSEA AND VOMITING THAT IS NOT CONTROLLED WITH YOUR NAUSEA MEDICATION  *UNUSUAL SHORTNESS OF BREATH  *UNUSUAL BRUISING OR BLEEDING  TENDERNESS IN MOUTH AND THROAT WITH OR WITHOUT PRESENCE OF ULCERS  *URINARY PROBLEMS  *BOWEL PROBLEMS  UNUSUAL RASH Items with * indicate a potential emergency and should be followed up as soon as possible. If you have an emergency after office hours please contact your primary care physician or go to the nearest emergency department.  Please call the clinic during office hours if you have any questions or concerns.   You may also contact the Patient Navigator at [phone] should you have any questions or need assistance in obtaining follow up care.      Resources For Cancer Patients and their Caregivers ? American Cancer Society: Can assist with transportation, wigs, general needs, runs Look Good Feel Better.        [phone] ? Cancer Care: Provides financial assistance, online support groups, medication/co-pay assistance.   1-800-813-HOPE (925)727-6267) ? [place] Assists [place] cancer patients and their families through emotional , educational and financial support.  ([phone] ? [place] DSS Where to apply for food stamps, Medicaid and utility assistance. [phone] ? RCATS: Transportation to medical appointments. 2246929543 ? Social Security Administration: May apply for disability if have a Stage IV cancer. 6091958648 [phone] ? [employer], Disability and Transit Services: Assists with nutrition, care and transit needs. [phone]

## 2016-01-08 NOTE — Progress Notes (Signed)
Tom [name] tolerated chemo tx well without complaints. Port left accessed for chemo tomorrow. Pt discharged self ambulatory in satisfactory condition with daughter

## 2016-01-09 ENCOUNTER — Encounter (HOSPITAL_BASED_OUTPATIENT_CLINIC_OR_DEPARTMENT_OTHER): Payer: Medicaid Other

## 2016-01-09 VITALS — BP 105/77 | HR 54 | Temp 98.3°F | Resp 20 | Wt 219.4 lb

## 2016-01-09 DIAGNOSIS — C787 Secondary malignant neoplasm of liver and intrahepatic bile duct: Secondary | ICD-10-CM | POA: Diagnosis not present

## 2016-01-09 DIAGNOSIS — Z5111 Encounter for antineoplastic chemotherapy: Secondary | ICD-10-CM | POA: Diagnosis not present

## 2016-01-09 DIAGNOSIS — C189 Malignant neoplasm of colon, unspecified: Secondary | ICD-10-CM

## 2016-01-09 MED ORDER — HEPARIN SOD (PORK) LOCK FLUSH 100 UNIT/ML IV SOLN
500.0000 [IU] | Freq: Once | INTRAVENOUS | Status: AC | PRN
  Administered 2016-01-09: 500 [IU]
  Filled 2016-01-09: qty 5

## 2016-01-09 MED ORDER — SODIUM CHLORIDE 0.9 % IV SOLN
8.0000 mg | Freq: Once | INTRAVENOUS | Status: AC
  Administered 2016-01-09: 8 mg via INTRAVENOUS
  Filled 2016-01-09: qty 4

## 2016-01-09 MED ORDER — SODIUM CHLORIDE 0.9 % IV SOLN
340.0000 mg/m2 | Freq: Once | INTRAVENOUS | Status: AC
  Administered 2016-01-09: 700 mg via INTRAVENOUS
  Filled 2016-01-09: qty 14

## 2016-01-09 MED ORDER — PROCHLORPERAZINE MALEATE 10 MG PO TABS
10.0000 mg | ORAL_TABLET | Freq: Once | ORAL | Status: AC
  Administered 2016-01-09: 10 mg via ORAL
  Filled 2016-01-09: qty 1

## 2016-01-09 MED ORDER — SODIUM CHLORIDE 0.9 % IJ SOLN
10.0000 mL | INTRAMUSCULAR | Status: DC | PRN
  Administered 2016-01-09: 10 mL
  Filled 2016-01-09: qty 10

## 2016-01-09 MED ORDER — LEUCOVORIN CALCIUM INJECTION 100 MG
20.0000 mg/m2 | Freq: Once | INTRAMUSCULAR | Status: AC
  Administered 2016-01-09: 42 mg via INTRAVENOUS
  Filled 2016-01-09: qty 2.1

## 2016-01-09 MED ORDER — SODIUM CHLORIDE 0.9 % IV SOLN
Freq: Once | INTRAVENOUS | Status: AC
  Administered 2016-01-09: 10:00:00 via INTRAVENOUS

## 2016-01-09 NOTE — Progress Notes (Signed)
Tom [name] tolerated chemo tx well without complaints. Port flushed per protocol and left accessed for use tomorrow. Pt discharged self ambulatory in satisfactory condition

## 2016-01-09 NOTE — Patient Instructions (Signed)
[facility] Discharge Instructions for Patients Receiving Chemotherapy   Beginning January 23rd 2017 lab work for the Cancer Center will be done in the  Main lab at [place] on 1st floor. If you have a lab appointment with the Cancer Center please come in thru the  Main Entrance and check in at the main information desk   Today you received the following chemotherapy agents Leucovorin and 5FU. Follow-up as scheduled. Call clinic for any questions or concerns  To help prevent nausea and vomiting after your treatment, we encourage you to take your nausea medication   If you develop nausea and vomiting, or diarrhea that is not controlled by your medication, call the clinic.  The clinic phone number is [phone]. Office hours are Monday-Friday 8:30am-5:00pm.  BELOW ARE SYMPTOMS THAT SHOULD BE REPORTED IMMEDIATELY:  *FEVER GREATER THAN 101.0 F  *CHILLS WITH OR WITHOUT FEVER  NAUSEA AND VOMITING THAT IS NOT CONTROLLED WITH YOUR NAUSEA MEDICATION  *UNUSUAL SHORTNESS OF BREATH  *UNUSUAL BRUISING OR BLEEDING  TENDERNESS IN MOUTH AND THROAT WITH OR WITHOUT PRESENCE OF ULCERS  *URINARY PROBLEMS  *BOWEL PROBLEMS  UNUSUAL RASH Items with * indicate a potential emergency and should be followed up as soon as possible. If you have an emergency after office hours please contact your primary care physician or go to the nearest emergency department.  Please call the clinic during office hours if you have any questions or concerns.   You may also contact the Patient Navigator at [phone] should you have any questions or need assistance in obtaining follow up care.      Resources For Cancer Patients and their Caregivers ? American Cancer Society: Can assist with transportation, wigs, general needs, runs Look Good Feel Better.        1-888-227-6333 ? Cancer Care: Provides financial assistance, online support groups, medication/co-pay assistance.   1-800-813-HOPE (4673) ? [facility] Assists [place] cancer patients and their families through emotional , educational and financial support.  336-427-4357 ? [place] DSS Where to apply for food stamps, Medicaid and utility assistance. 336-342-1394 ? [employer]ATS: Transportation to medical appointments. 336-347-2287 ? Social Security Administration: May apply for disability if have a Stage IV cancer. [phone] 1-800-772-1213 ? [place] Aging, Disability and Transit Services: Assists with nutrition, care and transit needs. [phone]         

## 2016-01-10 ENCOUNTER — Ambulatory Visit (HOSPITAL_COMMUNITY): Payer: Self-pay

## 2016-01-10 ENCOUNTER — Encounter (HOSPITAL_BASED_OUTPATIENT_CLINIC_OR_DEPARTMENT_OTHER): Payer: Medicaid Other

## 2016-01-10 VITALS — BP 122/54 | HR 57 | Temp 98.1°F | Resp 18 | Wt 222.6 lb

## 2016-01-10 DIAGNOSIS — C787 Secondary malignant neoplasm of liver and intrahepatic bile duct: Secondary | ICD-10-CM

## 2016-01-10 DIAGNOSIS — C189 Malignant neoplasm of colon, unspecified: Secondary | ICD-10-CM

## 2016-01-10 DIAGNOSIS — Z5111 Encounter for antineoplastic chemotherapy: Secondary | ICD-10-CM | POA: Diagnosis not present

## 2016-01-10 MED ORDER — SODIUM CHLORIDE 0.9 % IJ SOLN
10.0000 mL | INTRAMUSCULAR | Status: DC | PRN
  Administered 2016-01-10: 10 mL
  Filled 2016-01-10: qty 10

## 2016-01-10 MED ORDER — HEPARIN SOD (PORK) LOCK FLUSH 100 UNIT/ML IV SOLN
500.0000 [IU] | Freq: Once | INTRAVENOUS | Status: AC | PRN
  Administered 2016-01-10: 500 [IU]
  Filled 2016-01-10: qty 5

## 2016-01-10 MED ORDER — LEUCOVORIN CALCIUM INJECTION 100 MG
20.0000 mg/m2 | Freq: Once | INTRAMUSCULAR | Status: AC
  Administered 2016-01-10: 42 mg via INTRAVENOUS
  Filled 2016-01-10: qty 2.1

## 2016-01-10 MED ORDER — SODIUM CHLORIDE 0.9 % IV SOLN
8.0000 mg | Freq: Once | INTRAVENOUS | Status: AC
  Administered 2016-01-10: 8 mg via INTRAVENOUS
  Filled 2016-01-10: qty 4

## 2016-01-10 MED ORDER — SODIUM CHLORIDE 0.9 % IV SOLN
340.0000 mg/m2 | Freq: Once | INTRAVENOUS | Status: AC
  Administered 2016-01-10: 700 mg via INTRAVENOUS
  Filled 2016-01-10: qty 14

## 2016-01-10 MED ORDER — PROCHLORPERAZINE MALEATE 10 MG PO TABS
10.0000 mg | ORAL_TABLET | Freq: Once | ORAL | Status: AC
  Administered 2016-01-10: 10 mg via ORAL
  Filled 2016-01-10: qty 1

## 2016-01-10 MED ORDER — SODIUM CHLORIDE 0.9 % IV SOLN
Freq: Once | INTRAVENOUS | Status: AC
  Administered 2016-01-10: 09:00:00 via INTRAVENOUS

## 2016-01-10 NOTE — Patient Instructions (Signed)
[facility] Discharge Instructions for Patients Receiving Chemotherapy   Beginning January 23rd 2017 lab work for the Cancer Center will be done in the  Main lab at [place] on 1st floor. If you have a lab appointment with the Cancer Center please come in thru the  Main Entrance and check in at the main information desk   Today you received the following chemotherapy agents Leucovorin and 5FU. Follow-up as scheduled. Call clinic for any questions or concerns  To help prevent nausea and vomiting after your treatment, we encourage you to take your nausea medication   If you develop nausea and vomiting, or diarrhea that is not controlled by your medication, call the clinic.  The clinic phone number is [phone]. Office hours are Monday-Friday 8:30am-5:00pm.  BELOW ARE SYMPTOMS THAT SHOULD BE REPORTED IMMEDIATELY:  *FEVER GREATER THAN 101.0 F  *CHILLS WITH OR WITHOUT FEVER  NAUSEA AND VOMITING THAT IS NOT CONTROLLED WITH YOUR NAUSEA MEDICATION  *UNUSUAL SHORTNESS OF BREATH  *UNUSUAL BRUISING OR BLEEDING  TENDERNESS IN MOUTH AND THROAT WITH OR WITHOUT PRESENCE OF ULCERS  *URINARY PROBLEMS  *BOWEL PROBLEMS  UNUSUAL RASH Items with * indicate a potential emergency and should be followed up as soon as possible. If you have an emergency after office hours please contact your primary care physician or go to the nearest emergency department.  Please call the clinic during office hours if you have any questions or concerns.   You may also contact the Patient Navigator at [phone] should you have any questions or need assistance in obtaining follow up care.      Resources For Cancer Patients and their Caregivers ? American Cancer Society: Can assist with transportation, wigs, general needs, runs Look Good Feel Better.        1-888-227-6333 ? Cancer Care: Provides financial assistance, online support groups, medication/co-pay assistance.   1-800-813-HOPE (4673) ? [facility] Assists [place] cancer patients and their families through emotional , educational and financial support.  336-427-4357 ? [place] DSS Where to apply for food stamps, Medicaid and utility assistance. 336-342-1394 ? RCATS: Transportation to medical appointments. 336-347-2287 ? Social Security Administration: May apply for disability if have a Stage IV cancer. 336-342-7796 1-800-772-1213 ? [place] Aging, Disability and Transit Services: Assists with nutrition, care and transit needs. [phone]         

## 2016-01-10 NOTE — Progress Notes (Signed)
Tom [name] tolerated chemo tx well without complaints. Pt discharged self ambulatory in satisfactory condition with daughter

## 2016-02-04 ENCOUNTER — Encounter (HOSPITAL_COMMUNITY): Payer: Medicaid Other | Attending: Hematology & Oncology

## 2016-02-04 VITALS — BP 122/60 | HR 66 | Temp 98.0°F | Resp 18 | Wt 220.8 lb

## 2016-02-04 DIAGNOSIS — C787 Secondary malignant neoplasm of liver and intrahepatic bile duct: Secondary | ICD-10-CM | POA: Insufficient documentation

## 2016-02-04 DIAGNOSIS — Z5111 Encounter for antineoplastic chemotherapy: Secondary | ICD-10-CM

## 2016-02-04 DIAGNOSIS — C189 Malignant neoplasm of colon, unspecified: Secondary | ICD-10-CM

## 2016-02-04 DIAGNOSIS — C187 Malignant neoplasm of sigmoid colon: Secondary | ICD-10-CM | POA: Insufficient documentation

## 2016-02-04 LAB — COMPREHENSIVE METABOLIC PANEL
ALT: 24 U/L (ref 17–63)
ANION GAP: 8 (ref 5–15)
AST: 28 U/L (ref 15–41)
Albumin: 4.2 g/dL (ref 3.5–5.0)
Alkaline Phosphatase: 95 U/L (ref 38–126)
BILIRUBIN TOTAL: 0.3 mg/dL (ref 0.3–1.2)
BUN: 16 mg/dL (ref 6–20)
CHLORIDE: 104 mmol/L (ref 101–111)
CO2: 24 mmol/L (ref 22–32)
Calcium: 9 mg/dL (ref 8.9–10.3)
Creatinine, Ser: 1.04 mg/dL (ref 0.61–1.24)
GFR calc Af Amer: >60 mL/min (ref >60)
Glucose, Bld: 131 mg/dL — ABNORMAL HIGH (ref 65–99)
POTASSIUM: 3.9 mmol/L (ref 3.5–5.1)
Sodium: 136 mmol/L (ref 135–145)
TOTAL PROTEIN: 7.3 g/dL (ref 6.5–8.1)

## 2016-02-04 LAB — CBC WITH DIFFERENTIAL/PLATELET
Basophils Absolute: 0 10*3/uL (ref 0.0–0.1)
Basophils Relative: 1 %
EOS PCT: 3 %
Eosinophils Absolute: 0.2 10*3/uL (ref 0.0–0.7)
HCT: 40.4 % (ref 39.0–52.0)
Hemoglobin: 14 g/dL (ref 13.0–17.0)
LYMPHS ABS: 1.9 10*3/uL (ref 0.7–4.0)
LYMPHS PCT: 28 %
MCH: 30.2 pg (ref 26.0–34.0)
MCHC: 34.7 g/dL (ref 30.0–36.0)
MCV: 87.1 fL (ref 78.0–100.0)
MONO ABS: 0.6 10*3/uL (ref 0.1–1.0)
MONOS PCT: 8 %
Neutro Abs: 4 10*3/uL (ref 1.7–7.7)
Neutrophils Relative %: 60 %
PLATELETS: 207 10*3/uL (ref 150–400)
RBC: 4.64 MIL/uL (ref 4.22–5.81)
RDW: 15 % (ref 11.5–15.5)
WBC: 6.6 10*3/uL (ref 4.0–10.5)

## 2016-02-04 MED ORDER — PROCHLORPERAZINE MALEATE 10 MG PO TABS
10.0000 mg | ORAL_TABLET | Freq: Once | ORAL | Status: AC
  Administered 2016-02-04: 10 mg via ORAL
  Filled 2016-02-04: qty 1

## 2016-02-04 MED ORDER — SODIUM CHLORIDE 0.9 % IV SOLN
8.0000 mg | Freq: Once | INTRAVENOUS | Status: AC
  Administered 2016-02-04: 8 mg via INTRAVENOUS
  Filled 2016-02-04: qty 4

## 2016-02-04 MED ORDER — LEUCOVORIN CALCIUM INJECTION 100 MG
20.0000 mg/m2 | Freq: Once | INTRAMUSCULAR | Status: AC
  Administered 2016-02-04: 42 mg via INTRAVENOUS
  Filled 2016-02-04: qty 2.1

## 2016-02-04 MED ORDER — HEPARIN SOD (PORK) LOCK FLUSH 100 UNIT/ML IV SOLN
500.0000 [IU] | Freq: Once | INTRAVENOUS | Status: AC | PRN
  Administered 2016-02-04: 500 [IU]
  Filled 2016-02-04: qty 5

## 2016-02-04 MED ORDER — SODIUM CHLORIDE 0.9 % IV SOLN
Freq: Once | INTRAVENOUS | Status: AC
  Administered 2016-02-04: 11:00:00 via INTRAVENOUS

## 2016-02-04 MED ORDER — SODIUM CHLORIDE 0.9 % IV SOLN
340.0000 mg/m2 | Freq: Once | INTRAVENOUS | Status: AC
  Administered 2016-02-04: 700 mg via INTRAVENOUS
  Filled 2016-02-04: qty 14

## 2016-02-04 MED ORDER — SODIUM CHLORIDE 0.9 % IJ SOLN
10.0000 mL | INTRAMUSCULAR | Status: DC | PRN
  Administered 2016-02-04: 10 mL
  Filled 2016-02-04: qty 10

## 2016-02-04 NOTE — Patient Instructions (Signed)
[facility] Discharge Instructions for Patients Receiving Chemotherapy   Beginning January 23rd 2017 lab work for the Cancer Center will be done in the  Main lab at  on 1st floor. If you have a lab appointment with the Cancer Center please come in thru the  Main Entrance and check in at the main information desk   Today you received the following chemotherapy agents Leucovorin and 5FU. Follow-up as scheduled. Call clinic for any questions or concerns  To help prevent nausea and vomiting after your treatment, we encourage you to take your nausea medication   If you develop nausea and vomiting, or diarrhea that is not controlled by your medication, call the clinic.  The clinic phone number is [phone]. Office hours are Monday-Friday 8:30am-5:00pm.  BELOW ARE SYMPTOMS THAT SHOULD BE REPORTED IMMEDIATELY:  *FEVER GREATER THAN 101.0 F  *CHILLS WITH OR WITHOUT FEVER  NAUSEA AND VOMITING THAT IS NOT CONTROLLED WITH YOUR NAUSEA MEDICATION  *UNUSUAL SHORTNESS OF BREATH  *UNUSUAL BRUISING OR BLEEDING  TENDERNESS IN MOUTH AND THROAT WITH OR WITHOUT PRESENCE OF ULCERS  *URINARY PROBLEMS  *BOWEL PROBLEMS  UNUSUAL RASH Items with * indicate a potential emergency and should be followed up as soon as possible. If you have an emergency after office hours please contact your primary care physician or go to the nearest emergency department.  Please call the clinic during office hours if you have any questions or concerns.   You may also contact the Patient Navigator at [phone] should you have any questions or need assistance in obtaining follow up care.      Resources For Cancer Patients and their Caregivers ? American Cancer Society: Can assist with transportation, wigs, general needs, runs Look Good Feel Better.        1-888-227-6333 ? Cancer Care: Provides financial assistance, online support groups, medication/co-pay assistance.   1-800-813-HOPE (4673) ? [facility] Assists [place] cancer patients and their families through emotional , educational and financial support.  336-427-4357 ? [place] DSS Where to apply for food stamps, Medicaid and utility assistance. 336-342-1394 ? RCATS: Transportation to medical appointments. 336-347-2287 ? Social Security Administration: May apply for disability if have a Stage IV cancer. 336-342-7796 1-800-772-1213 ? [place] Aging, Disability and Transit Services: Assists with nutrition, care and transit needs. [phone]         

## 2016-02-04 NOTE — Progress Notes (Signed)
Nole Monahan Pursell tolerated chemo tx well without issues. VSS upon discharge. Pt discharged self ambulatory in satisfactory condition

## 2016-02-05 ENCOUNTER — Ambulatory Visit (HOSPITAL_COMMUNITY): Payer: Self-pay

## 2016-02-05 ENCOUNTER — Encounter (HOSPITAL_BASED_OUTPATIENT_CLINIC_OR_DEPARTMENT_OTHER): Payer: Medicaid Other

## 2016-02-05 VITALS — BP 118/66 | HR 64 | Temp 98.5°F | Resp 18 | Wt 222.4 lb

## 2016-02-05 DIAGNOSIS — Z5111 Encounter for antineoplastic chemotherapy: Secondary | ICD-10-CM

## 2016-02-05 DIAGNOSIS — C189 Malignant neoplasm of colon, unspecified: Secondary | ICD-10-CM

## 2016-02-05 DIAGNOSIS — C787 Secondary malignant neoplasm of liver and intrahepatic bile duct: Secondary | ICD-10-CM

## 2016-02-05 LAB — CEA: CEA: 3.9 ng/mL (ref 0.0–4.7)

## 2016-02-05 MED ORDER — LEUCOVORIN CALCIUM INJECTION 100 MG
20.0000 mg/m2 | Freq: Once | INTRAMUSCULAR | Status: AC
  Administered 2016-02-05: 42 mg via INTRAVENOUS
  Filled 2016-02-05: qty 2.1

## 2016-02-05 MED ORDER — SODIUM CHLORIDE 0.9 % IV SOLN
Freq: Once | INTRAVENOUS | Status: AC
  Administered 2016-02-05: 11:00:00 via INTRAVENOUS

## 2016-02-05 MED ORDER — HEPARIN SOD (PORK) LOCK FLUSH 100 UNIT/ML IV SOLN
500.0000 [IU] | Freq: Once | INTRAVENOUS | Status: AC | PRN
  Administered 2016-02-05: 500 [IU]
  Filled 2016-02-05: qty 5

## 2016-02-05 MED ORDER — SODIUM CHLORIDE 0.9 % IJ SOLN
10.0000 mL | INTRAMUSCULAR | Status: DC | PRN
  Administered 2016-02-05: 10 mL
  Filled 2016-02-05: qty 10

## 2016-02-05 MED ORDER — SODIUM CHLORIDE 0.9 % IV SOLN
340.0000 mg/m2 | Freq: Once | INTRAVENOUS | Status: AC
  Administered 2016-02-05: 700 mg via INTRAVENOUS
  Filled 2016-02-05: qty 14

## 2016-02-05 MED ORDER — PROCHLORPERAZINE MALEATE 10 MG PO TABS
10.0000 mg | ORAL_TABLET | Freq: Once | ORAL | Status: AC
  Administered 2016-02-05: 10 mg via ORAL
  Filled 2016-02-05: qty 1

## 2016-02-05 MED ORDER — SODIUM CHLORIDE 0.9 % IV SOLN
8.0000 mg | Freq: Once | INTRAVENOUS | Status: AC
  Administered 2016-02-05: 8 mg via INTRAVENOUS
  Filled 2016-02-05: qty 4

## 2016-02-05 NOTE — Progress Notes (Signed)
Tom [name] tolerated chemo tx well without complaints. VVS upon discharge. Port flushed and left accessed for chemo tx tomorrow. Pt discharged self ambulatory in satisfactory condition

## 2016-02-05 NOTE — Patient Instructions (Signed)
[facility] Discharge Instructions for Patients Receiving Chemotherapy   Beginning January 23rd 2017 lab work for the Cancer Center will be done in the  Main lab at [place] on 1st floor. If you have a lab appointment with the Cancer Center please come in thru the  Main Entrance and check in at the main information desk   Today you received the following chemotherapy agents Leucovorin and 5FU. Follow-up as scheduled. Call clinic for any questions or concerns  To help prevent nausea and vomiting after your treatment, we encourage you to take your nausea medication   If you develop nausea and vomiting, or diarrhea that is not controlled by your medication, call the clinic.  The clinic phone number is [phone]. Office hours are Monday-Friday 8:30am-5:00pm.  BELOW ARE SYMPTOMS THAT SHOULD BE REPORTED IMMEDIATELY:  *FEVER GREATER THAN 101.0 F  *CHILLS WITH OR WITHOUT FEVER  NAUSEA AND VOMITING THAT IS NOT CONTROLLED WITH YOUR NAUSEA MEDICATION  *UNUSUAL SHORTNESS OF BREATH  *UNUSUAL BRUISING OR BLEEDING  TENDERNESS IN MOUTH AND THROAT WITH OR WITHOUT PRESENCE OF ULCERS  *URINARY PROBLEMS  *BOWEL PROBLEMS  UNUSUAL RASH Items with * indicate a potential emergency and should be followed up as soon as possible. If you have an emergency after office hours please contact your primary care physician or go to the nearest emergency department.  Please call the clinic during office hours if you have any questions or concerns.   You may also contact the Patient Navigator at [phone] should you have any questions or need assistance in obtaining follow up care.      Resources For Cancer Patients and their Caregivers ? American Cancer Society: Can assist with transportation, wigs, general needs, runs Look Good Feel Better.        1-888-227-6333 ? Cancer Care: Provides financial assistance, online support groups, medication/co-pay assistance.   1-800-813-HOPE (4673) ? [facility] Assists [place] cancer patients and their families through emotional , educational and financial support.  336-427-4357 ? [place] DSS Where to apply for food stamps, Medicaid and utility assistance. 336-342-1394 ? RCATS: Transportation to medical appointments. 336-347-2287 ? Social Security Administration: May apply for disability if have a Stage IV cancer. 336-342-7796 1-800-772-1213 ? [place] Aging, Disability and Transit Services: Assists with nutrition, care and transit needs. [phone]         

## 2016-02-06 ENCOUNTER — Ambulatory Visit (HOSPITAL_COMMUNITY): Payer: Self-pay

## 2016-02-06 ENCOUNTER — Encounter (HOSPITAL_BASED_OUTPATIENT_CLINIC_OR_DEPARTMENT_OTHER): Payer: Medicaid Other

## 2016-02-06 ENCOUNTER — Encounter (HOSPITAL_COMMUNITY): Payer: Self-pay | Admitting: Hematology & Oncology

## 2016-02-06 ENCOUNTER — Encounter (HOSPITAL_BASED_OUTPATIENT_CLINIC_OR_DEPARTMENT_OTHER): Payer: Medicaid Other | Admitting: Hematology & Oncology

## 2016-02-06 VITALS — BP 120/69 | HR 60 | Temp 97.9°F | Resp 16

## 2016-02-06 VITALS — BP 129/79 | HR 65 | Temp 98.0°F | Resp 16 | Wt 220.0 lb

## 2016-02-06 DIAGNOSIS — C787 Secondary malignant neoplasm of liver and intrahepatic bile duct: Secondary | ICD-10-CM | POA: Diagnosis not present

## 2016-02-06 DIAGNOSIS — C187 Malignant neoplasm of sigmoid colon: Secondary | ICD-10-CM | POA: Diagnosis not present

## 2016-02-06 DIAGNOSIS — K5901 Slow transit constipation: Secondary | ICD-10-CM

## 2016-02-06 DIAGNOSIS — C7802 Secondary malignant neoplasm of left lung: Secondary | ICD-10-CM | POA: Diagnosis not present

## 2016-02-06 DIAGNOSIS — R809 Proteinuria, unspecified: Secondary | ICD-10-CM

## 2016-02-06 DIAGNOSIS — Z5111 Encounter for antineoplastic chemotherapy: Secondary | ICD-10-CM | POA: Diagnosis present

## 2016-02-06 DIAGNOSIS — C189 Malignant neoplasm of colon, unspecified: Secondary | ICD-10-CM | POA: Diagnosis not present

## 2016-02-06 MED ORDER — SODIUM CHLORIDE 0.9 % IV SOLN
8.0000 mg | Freq: Once | INTRAVENOUS | Status: AC
  Administered 2016-02-06: 8 mg via INTRAVENOUS
  Filled 2016-02-06: qty 4

## 2016-02-06 MED ORDER — PROCHLORPERAZINE MALEATE 10 MG PO TABS
ORAL_TABLET | ORAL | Status: AC
  Filled 2016-02-06: qty 1

## 2016-02-06 MED ORDER — STERILE WATER FOR INJECTION IJ SOLN
INTRAMUSCULAR | Status: AC
  Filled 2016-02-06: qty 10

## 2016-02-06 MED ORDER — HEPARIN SOD (PORK) LOCK FLUSH 100 UNIT/ML IV SOLN
500.0000 [IU] | Freq: Once | INTRAVENOUS | Status: AC | PRN
  Administered 2016-02-06: 500 [IU]

## 2016-02-06 MED ORDER — FLUOROURACIL CHEMO INJECTION 2.5 GM/50ML
340.0000 mg/m2 | Freq: Once | INTRAVENOUS | Status: AC
  Administered 2016-02-06: 700 mg via INTRAVENOUS
  Filled 2016-02-06: qty 14

## 2016-02-06 MED ORDER — SODIUM CHLORIDE 0.9 % IV SOLN
Freq: Once | INTRAVENOUS | Status: AC
  Administered 2016-02-06: 10:00:00 via INTRAVENOUS

## 2016-02-06 MED ORDER — PROCHLORPERAZINE MALEATE 10 MG PO TABS
10.0000 mg | ORAL_TABLET | Freq: Once | ORAL | Status: AC
  Administered 2016-02-06: 10 mg via ORAL

## 2016-02-06 MED ORDER — HEPARIN SOD (PORK) LOCK FLUSH 100 UNIT/ML IV SOLN
INTRAVENOUS | Status: AC
  Filled 2016-02-06: qty 5

## 2016-02-06 MED ORDER — ALTEPLASE 2 MG IJ SOLR
INTRAMUSCULAR | Status: AC
  Filled 2016-02-06: qty 2

## 2016-02-06 MED ORDER — LEUCOVORIN CALCIUM INJECTION 100 MG
20.0000 mg/m2 | Freq: Once | INTRAMUSCULAR | Status: AC
  Administered 2016-02-06: 42 mg via INTRAVENOUS
  Filled 2016-02-06: qty 2.1

## 2016-02-06 MED ORDER — SODIUM CHLORIDE 0.9 % IJ SOLN
10.0000 mL | INTRAMUSCULAR | Status: DC | PRN
  Administered 2016-02-06: 10 mL
  Filled 2016-02-06: qty 10

## 2016-02-06 MED ORDER — ALTEPLASE 2 MG IJ SOLR
2.0000 mg | Freq: Once | INTRAMUSCULAR | Status: AC | PRN
  Administered 2016-02-06: 2 mg

## 2016-02-06 NOTE — Patient Instructions (Addendum)
[place] [facility] Discharge Instructions  RECOMMENDATIONS MADE BY THE CONSULTANT AND ANY TEST RESULTS WILL BE SENT TO YOUR REFERRING PHYSICIAN.  You saw Dr. Whitney Muse today. Chemo is cancelled for Monday. Follow up with Tom at next cycle chemo and labs  Thank you for choosing [place] at [facility] to provide your oncology and hematology care.  To afford each patient quality time with our provider, please arrive at least 15 minutes before your scheduled appointment time.   Beginning January 23rd 2017 lab work for the [name] will be done in the  Main lab at [facility] on 1st floor. If you have a lab appointment with the [facility] please come in thru the  Main Entrance and check in at the main information desk  You need to re-schedule your appointment should you arrive 10 or more minutes late.  We strive to give you quality time with our providers, and arriving late affects you and other patients whose appointments are after yours.  Also, if you no show three or more times for appointments you may be dismissed from the clinic at the providers discretion.     Again, thank you for choosing [facility].  Our hope is that these requests will decrease the amount of time that you wait before being seen by our physicians.       _____________________________________________________________  Should you have questions after your visit to [facility], please contact our office at [phone] between the hours of 8:30 a.m. and 4:30 p.m.  Voicemails left after 4:30 p.m. will not be returned until the following business day.  For prescription refill requests, have your pharmacy contact our office.         Resources For Cancer Patients and their Caregivers ? American Cancer Society: Can assist with transportation, wigs, general needs, runs Look Good Feel Better.        ([phone] ? Cancer Care: Provides  financial assistance, online support groups, medication/co-pay assistance.  1-800-813-HOPE 505-483-6420) ? [facility] Assists [place] cancer patients and their families through emotional , educational and financial support.  8287329672 ? [place] DSS Where to apply for food stamps, Medicaid and utility assistance. (231)112-7275 ? RCATS: Transportation to medical appointments. 734 867 2636 ? Social Security Administration: May apply for disability if have a Stage IV cancer. 445-764-5371 [phone] ? [employer], Disability and Transit Services: Assists with nutrition, care and transit needs. [place] Support Programs: @10RELATIVEDAYS @ > Cancer Support Group  2nd Tuesday of the month 1pm-2pm, Journey Room  > Creative Journey  3rd Tuesday of the month 1130am-1pm, Journey Room  > Look Good Feel Better  1st Wednesday of the month 10am-12 noon, Journey Room (Call [facility] to register [phone])

## 2016-02-06 NOTE — Patient Instructions (Signed)
[facility] Discharge Instructions for Patients Receiving Chemotherapy   Beginning January 23rd 2017 lab work for the [facility] will be done in the  Main lab at [facility] on 1st floor. If you have a lab appointment with the [facility] please come in thru the  Main Entrance and check in at the main information desk   Today you received the following chemotherapy agents Leucovorin and 66fu.   If you develop nausea and vomiting, or diarrhea that is not controlled by your medication, call the clinic.  The clinic phone number is [phone]. Office hours are Monday-Friday 8:30am-5:00pm.  BELOW ARE SYMPTOMS THAT SHOULD BE REPORTED IMMEDIATELY:  *FEVER GREATER THAN 101.0 F  *CHILLS WITH OR WITHOUT FEVER  NAUSEA AND VOMITING THAT IS NOT CONTROLLED WITH YOUR NAUSEA MEDICATION  *UNUSUAL SHORTNESS OF BREATH  *UNUSUAL BRUISING OR BLEEDING  TENDERNESS IN MOUTH AND THROAT WITH OR WITHOUT PRESENCE OF ULCERS  *URINARY PROBLEMS  *BOWEL PROBLEMS  UNUSUAL RASH Items with * indicate a potential emergency and should be followed up as soon as possible. If you have an emergency after office hours please contact your primary care physician or go to the nearest emergency department.  Please call the clinic during office hours if you have any questions or concerns.   You may also contact the Patient Navigator at [phone] should you have any questions or need assistance in obtaining follow up care.      Resources For Cancer Patients and their Caregivers ? American Cancer Society: Can assist with transportation, wigs, general needs, runs Look Good Feel Better.        8574190153 ? Cancer Care: Provides financial assistance, online support groups, medication/co-pay assistance.  1-800-813-HOPE 567-601-9785) ? [facility] Assists [place] cancer patients and their families through emotional , educational and financial support.   [phone] ? [place] DSS Where to apply for food stamps, Medicaid and utility assistance. 325-249-6281 ? [employer]: Transportation to medical appointments. 425-298-0244 ? Social Security Administration: May apply for disability if have a Stage IV cancer. 559-230-6816 250-763-8828 ? [employer], Disability and Transit Services: Assists with nutrition, care and transit needs. [phone]

## 2016-02-06 NOTE — Progress Notes (Signed)
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 Appearance is most likely to represent diffuse hepatic metastasis      07/30/2014 Initial Diagnosis    Colon, biopsy, sigmoid - ADENOCARCINOMA.      07/31/2014 Tumor Marker    CEA- 48.0      07/31/2014 Imaging    Ct abd/pelvis- concerning for primary colorectal neoplasm in the mid to distal sigmoid colon, with lymphadenopathy in the sigmoid mesocolon, ileocolic mesentery, and retroperitoneum, as well as widespread metastatic disease to the liver      08/01/2014 Imaging    CT chest- Pathologic thoracic, right hilar, and infrahilar adenopathy associated with a masslike region of possible consolidation in the right middle lobe, surrounding nodularity, and some scattered bilateral pulmonary nodules.      08/06/2014 - 12/10/2014 Chemotherapy    FOLFOX.  Avastin added for cycle 2.  S/P 10 cycles.  Change to maintenance therapy secondary to positive response to therapy, increasing PN, and progressive proteinuria.      10/08/2014 Adverse Reaction    Avastin induced proteinuria >/= 2 gm Avastin on hold      10/15/2014 Treatment Plan Change    Avastin held for proteinuria.      10/22/2014 Imaging    Ct CAP- 1. Interval decrease and soft tissue adjacent to the mid sigmoid colon with decreasing sigmoid colon wall thickening. The  leocolic, mesenteric, perirectal, and mesocolonic lymphadenopathy has decreased in the interval.      10/30/2014 Survivorship    Genetic Counseling. Genetic testing was normal, and did not reveal  a deleterious mutation in these genes. The test report will be scanned into EPIC and will be located under the Media tab.       11/26/2014 Treatment Plan Change    Oxaliplatin reduced x 25% due to PN.      12/24/2014 Treatment Plan Change    Change to maintanence therapy.      01/09/2015 - 03/26/2015 Chemotherapy    Xeloda 2500 mg BID, 7 days on and 7 days off.      01/30/2015 Imaging    CT in ED- Evidence of distal small bowel obstruction with transition point over the ileum in the right upper pelvis immediately adjacent/abutting the known malignant stricturing of the sigmoid colon. There is wall thickening of the sigmoid colon wi...      01/30/2015 - 01/31/2015 Hospital Admission    SBO (small bowel obstruction)      03/26/2015 Treatment Plan Change    Xeloda cost-prohibitive without financial help      04/15/2015 -  Chemotherapy    5FU/Leucovorin days 1-5 every 28 days      05/13/2015 Adverse Reaction    Palmar-Plantar erythrodysesthesia      05/13/2015 Treatment Plan Change    5FU/Leucovorin held      05/28/2015 Treatment Plan Change    5FU dose reduced by 20%      07/03/2015 Imaging    CT CAP- Resolution right-sided  pulmonary nodules and decrease in size of small mediastinal nodes. Response to therapy of hepatic metastasis. Similar to slight increase in suspicious nodes in the high mesorectum.      10/29/2015 Imaging    CT CAP- Further reduction in size of the hepatic metastatic tumors, compatible with interval effective therapy. 2 mm left upper lobe pulmonary nodule has been stable over the past 15 months. Likely benign, may merit surveillance.        CURRENT THERAPY:  5-FU  INTERVAL HISTORY: Tom [name] 50 y.o. 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    He had some mild rectal bleeding. Patient states that he has had three days of hard stool which he  believes is what led to the bleeding he experienced. He says that although he has regular bowel movements, his bowels are hard. He takes Miralax to soften his stool, but he says that can cause him to have diarrhea.   Syd notes that not long ago he was in his recliner watching the morning news and suddenly experienced a sharp pain in the back of his leg. He describes the pain as "stabbing" and says that it was so severe, it brought tears to his eyes. The pain quickly subsided but tenderness in the area followed for a few days. This is now gone and has not recurred. No swelling in the leg is noted.   Patient is sleeping well, although he has some back pain. He says that his back pain may be caused by performing a lot of outdoor activities (i.e. Trimming bushes).  He says his wife is doing the same as she was previously. She still experiences back and leg pain. She wanted to come today but didn't feel well enough.   Patient denies any mouth sores. His previous rash cleared up and his appetite is normal.  He is supposed to have another appointment for chemo on Monday but does not want to go because his mother-in-law is in the hospital. They will be traveling to see her.    Past Medical History:  Diagnosis Date  . 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     has No Known Allergies.  Mr. Abarca had no medications administered during this visit.  Past Surgical History:  Procedure Laterality Date  . COLONOSCOPY N/A 07/30/2014   Procedure: COLONOSCOPY;  Surgeon:  Inda Castle, MD;  Location: [facility] ENDOSCOPY;  Service: Endoscopy;  Laterality: N/A;  . ESOPHAGOGASTRODUODENOSCOPY N/A 07/30/2014   Procedure: ESOPHAGOGASTRODUODENOSCOPY (EGD);  Surgeon: Inda Castle, MD;  Location: [facility];  Service: Endoscopy;  Laterality: N/A;  . OPERATIVE ULTRASOUND N/A 08/01/2014   Procedure: OPERATIVE ULTRASOUND;  Surgeon: Gayland Curry, MD;  Location: [facility] ORS;  Service: General;  Laterality: N/A;  . PORTACATH PLACEMENT Right 08/01/14  . PORTACATH PLACEMENT N/A 08/01/2014   Procedure: INSERTION RIGHT IJ PORT-A-CATH WITH ULTRASOUND AND FLUORO;  Surgeon: Gayland Curry, MD;  Location: [facility] ORS;  Service: General;  Laterality: N/A;   REVIEW OF SYSTEMS:  Denies any headaches, dizziness, double vision, fevers, chills, night sweats, nausea, vomiting,  diarrhea, chest pain, heart palpitations, shortness of breath, blood in stool, black tarry stool, urinary pain, urinary burning, urinary frequency, hematuria. Positive for back pain.     Managed with diazepam. Positive for leg pain  Sharp pain in leg  Positive for constipation and diarrhea      Constipation managed Miralax. 14 point review of systems was performed and is negative except as detailed under history of present illness and above   PHYSICAL EXAMINATION Vitals with BMI 02/06/2016  Height   Weight 220 lbs  BMI   Systolic 856  Diastolic 79  Pulse 65  Respirations 16   PERFORMANCE STATUS: 0 - Asymptomatic  GENERAL:alert, no distress, well nourished, well developed, comfortable, cooperative and smiling. Wears glasses.  SKIN: skin color, texture, turgor are normal, Rash on LLE almost gone HEAD: Normocephalic, No masses, lesions, tenderness or abnormalities EYES: normal, PERRLA, EOMI, Conjunctiva are pink and non-injected EARS: External ears normal OROPHARYNX:lips, buccal mucosa, and tongue normal and mucous membranes are moist  NECK: supple, trachea midline LYMPH:  No palpable adenopathy in the neck or  supraclavicular regions, no axillary adenopathy BREAST:not examined LUNGS: Air to auscultation bilaterally with no wheezing or rhonchi HEART: S1 and S2 audible, regular, no ectopy ABDOMEN:abdomen soft and normal bowel sounds. No palpable liver edge. Ostomy site intact BACK: Back symmetric, no curvature. EXTREMITIES:less then 2 second capillary refill, no joint deformities, effusion, or inflammation, no cyanosis NEURO: alert & oriented x 3 with fluent speech, no focal motor/sensory deficits, gait normal   LABORATORY DATA: I have reviewed the data below as listed.  CBC    Component Value Date/Time   WBC 6.6 02/04/2016 0958   RBC 4.64 02/04/2016 0958   HGB 14.0 02/04/2016 0958   HCT 40.4 02/04/2016 0958   PLT 207 02/04/2016 0958   MCV 87.1 02/04/2016 0958   MCH 30.2 02/04/2016 0958   MCHC 34.7 02/04/2016 0958   RDW 15.0 02/04/2016 0958   LYMPHSABS 1.9 02/04/2016 0958   MONOABS 0.6 02/04/2016 0958   EOSABS 0.2 02/04/2016 0958   BASOSABS 0.0 02/04/2016 0958   CMP     Component Value Date/Time   NA 136 02/04/2016 0958   K 3.9 02/04/2016 0958   CL 104 02/04/2016 0958   CO2 24 02/04/2016 0958   GLUCOSE 131 (H) 02/04/2016 0958   BUN 16 02/04/2016 0958   CREATININE 1.04 02/04/2016 0958   CALCIUM 9.0 02/04/2016 0958   PROT 7.3 02/04/2016 0958   ALBUMIN 4.2 02/04/2016 0958   AST 28 02/04/2016 0958   ALT 24 02/04/2016 0958   ALKPHOS 95 02/04/2016 0958   BILITOT 0.3 02/04/2016 0958   GFRNONAA >60 02/04/2016 0958   GFRAA >60 02/04/2016 0958   Results for YASSIN, SCALES (MRN [id number]) as of 02/06/2016 09:06  Ref. Range 10/14/2015 10:00 11/11/2015 09:22 12/09/2015 08:50 01/06/2016 10:03 02/04/2016 09:58  CEA Latest Ref Range: 0.0 - 4.7 ng/mL 1.9  2.5 2.4 3.9    RADIOLOGY: I have personally reviewed the radiological images as listed and agreed with the findings in the report.  Study Result     CLINICAL DATA: Colon cancer with liver metastatic disease.  Ongoing chemotherapy.  EXAM: CT CHEST, ABDOMEN, AND PELVIS WITH CONTRAST  TECHNIQUE: Multidetector CT imaging of the chest, abdomen and pelvis was performed following the standard protocol during bolus administration of intravenous contrast.  CONTRAST: 158m ISOVUE-300 IOPAMIDOL (ISOVUE-300) INJECTION 61%  COMPARISON: Multiple exams, including 07/03/2015  FINDINGS: CT CHEST FINDINGS  Mediastinum/Nodes: Mild atherosclerotic calcification of the  aortic arch. Port-A-Cath tip terminates at the upper cavoatrial junction.  Lungs/Pleura: 2 mm left upper lobe pulmonary nodule on image 54/10, stable, and also visible on image 23 of series 5 of the prior exam from 07/31/2014. This indicates 15 months stability of this lesion which is likely benign.  Musculoskeletal: Unremarkable  CT ABDOMEN PELVIS FINDINGS  Hepatobiliary: Further reduction in size and conspicuity of the hepatic metastatic lesions identified. A right hepatic lobe lesion on image 19/ 2 measures 2.0 by 2.0 cm, formerly 3.1 by 2.9 cm by my measurements. The other lesions are proportionally reduced in volume as well. No new hepatic metastatic lesions are identified. Gallbladder unremarkable.  Pancreas: Unremarkable  Spleen: Unremarkable  Adrenals/Urinary Tract: Adrenal glands normal. Several left renal cysts are present along with several hypodense lesions of the left kidney which are technically too small to characterize but likely cysts. A left kidney upper pole cystic lesion measuring 3.1 by 2.4 cm appears to have some faint dependent density suggesting a complex cyst.  Stomach/Bowel: Mild perirectal stranding. As before, there are some perirectal lymph nodes including a 7 mm in short axis node posterior to the rectum on image 68/2 (formerly 8 mm). Along the inferior margin of the inferior mesenteric artery as it approaches the narrowing in the sigmoid colon there is some ill-defined soft  tissue density on images 61-65 series 2, again very similar to prior, probably from scarring given the adhered appearance of the distal ileum to this region. There is some wall thickening and narrowing in the rectosigmoid junction example image 65/2.  Vascular/Lymphatic: Minimal aortoiliac atherosclerotic vascular disease. Small perirectal lymph nodes as noted above.  Reproductive: Unremarkable  Other: No supplemental non-categorized findings.  Musculoskeletal: Lumbar spondylosis and degenerative disc disease.  IMPRESSION: 1. Further reduction in size of the hepatic metastatic tumors, compatible with interval effective therapy. 2. 2 mm left upper lobe pulmonary nodule has been stable over the past 15 months. Likely benign, may merit surveillance. 3. There continues to be some apparent scarring just above the rectosigmoid junction, with some tethering of a distal loop of ileum in this vicinity, and soft tissue density in this area extending along the inferior extent of the IMA. Tumor is a less likely cause for this appearance. However, there is some narrowing of the rectosigmoid junction in this region. Small perirectal lymph nodes are essentially stable from prior. 4. No new lesions are identified.   Electronically Signed  By: Van Clines M.D.  On: 10/29/2015 14:51     ASSESSMENT AND PLAN:  Stage IV colon cancer, KRAS WT BACK PAIN  50 year old male with stage IV colon cancer he has had a significant improvement in his disease and clinical status.He is currently on single agent 5-FU with excellent tolerance. His PS is excellent. CEA is WNL, although it has climbed to 3.9 which may or may not be a sign of upcoming failure to current therapy. This will continue to be monitored.  Unfortunately AVASTIN had to be discontinued secondary to persistent proteinuria.    Plan is for ongoing therapy with 5-FU.  He will return to clinic for repeat PE, labs and ongoing  therapy with his next cycle. Chemotherapy on Monday has been discontinued per his request.  Genetics Counseling  He has completed genetics counseling. His genetic test results were normal--i.e. testing found no meaningful changes to any of 19 genes associated with increased risks for colorectal and other types of cancers.  Nephrotic Range Proteinuria  He has been off of Avastin which  is most commonly implicated in proteinuria since May 2. He has been seen by nephrology.  Constipation/Diarrhea  I also recommended that he take a lower dose of Miralax; enough to keep stool soft but not induce diarrhea. Ie. Half a capful daily  All questions were answered. The patient knows to call the clinic with any problems, questions or concerns. We can certainly see the patient much sooner if necessary.   This document serves as a record of services personally performed by Ancil Linsey, MD. It was created on her behalf by Elmyra Ricks, a trained medical scribe. The creation of this record is based on the scribe's personal observations and the provider's statements to them. This document has been checked and approved by the attending provider.  I have reviewed the above documentation for accuracy and completeness, and I agree with the above.  This note was electronically signed.  Kelby Fam. Whitney Muse, MD

## 2016-02-06 NOTE — Progress Notes (Signed)
Tolerated chemo well. Alteplase 66ml instilled in port as port has not given blood return past few days. STOP/DO NOT FLUSH label placed over port access site.  Ambulatory/stable on discharge home to self.

## 2016-02-07 ENCOUNTER — Encounter (HOSPITAL_BASED_OUTPATIENT_CLINIC_OR_DEPARTMENT_OTHER): Payer: Medicaid Other

## 2016-02-07 ENCOUNTER — Encounter (HOSPITAL_COMMUNITY): Payer: Self-pay

## 2016-02-07 VITALS — BP 135/71 | HR 66 | Temp 97.9°F | Resp 18 | Wt 219.6 lb

## 2016-02-07 DIAGNOSIS — C787 Secondary malignant neoplasm of liver and intrahepatic bile duct: Secondary | ICD-10-CM

## 2016-02-07 DIAGNOSIS — C189 Malignant neoplasm of colon, unspecified: Secondary | ICD-10-CM | POA: Diagnosis not present

## 2016-02-07 DIAGNOSIS — Z5111 Encounter for antineoplastic chemotherapy: Secondary | ICD-10-CM | POA: Diagnosis present

## 2016-02-07 MED ORDER — SODIUM CHLORIDE 0.9 % IJ SOLN
10.0000 mL | INTRAMUSCULAR | Status: DC | PRN
  Administered 2016-02-07: 10 mL
  Filled 2016-02-07: qty 10

## 2016-02-07 MED ORDER — LEUCOVORIN CALCIUM INJECTION 100 MG
20.0000 mg/m2 | Freq: Once | INTRAMUSCULAR | Status: AC
  Administered 2016-02-07: 42 mg via INTRAVENOUS
  Filled 2016-02-07: qty 2.1

## 2016-02-07 MED ORDER — HEPARIN SOD (PORK) LOCK FLUSH 100 UNIT/ML IV SOLN
INTRAVENOUS | Status: AC
  Filled 2016-02-07: qty 5

## 2016-02-07 MED ORDER — PROCHLORPERAZINE MALEATE 10 MG PO TABS
10.0000 mg | ORAL_TABLET | Freq: Once | ORAL | Status: AC
  Administered 2016-02-07: 10 mg via ORAL

## 2016-02-07 MED ORDER — PROCHLORPERAZINE MALEATE 10 MG PO TABS
ORAL_TABLET | ORAL | Status: AC
  Filled 2016-02-07: qty 1

## 2016-02-07 MED ORDER — SODIUM CHLORIDE 0.9 % IV SOLN
340.0000 mg/m2 | Freq: Once | INTRAVENOUS | Status: AC
  Administered 2016-02-07: 700 mg via INTRAVENOUS
  Filled 2016-02-07: qty 14

## 2016-02-07 MED ORDER — SODIUM CHLORIDE 0.9 % IV SOLN
8.0000 mg | Freq: Once | INTRAVENOUS | Status: AC
  Administered 2016-02-07: 8 mg via INTRAVENOUS
  Filled 2016-02-07: qty 4

## 2016-02-07 MED ORDER — HEPARIN SOD (PORK) LOCK FLUSH 100 UNIT/ML IV SOLN
500.0000 [IU] | Freq: Once | INTRAVENOUS | Status: AC | PRN
Start: 2016-02-07 — End: 2016-02-07
  Administered 2016-02-07: 500 [IU]

## 2016-02-07 MED ORDER — SODIUM CHLORIDE 0.9 % IV SOLN
Freq: Once | INTRAVENOUS | Status: AC
  Administered 2016-02-07: 10:00:00 via INTRAVENOUS

## 2016-02-07 NOTE — Progress Notes (Signed)
Patient presented today for chemo. Altaplase was given yesterday, pulled 59ml of blood off, flushed easily with saline. Chemotherapy given today per orders. Patient tolerated well without problems. Patient discharged from clinic amblatory. Vitals stable.

## 2016-02-07 NOTE — Patient Instructions (Signed)
[facility] Discharge Instructions for Patients Receiving Chemotherapy   Beginning January 23rd 2017 lab work for the [facility] will be done in the  Main lab at [facility] on 1st floor. If you have a lab appointment with the [facility] please come in thru the  Main Entrance and check in at the main information desk   Today you received the following chemotherapy agents Leucovorin/5FU  To help prevent nausea and vomiting after your treatment, we encourage you to take your nausea medication      If you develop nausea and vomiting, or diarrhea that is not controlled by your medication, call the clinic.  The clinic phone number is [phone]. Office hours are Monday-Friday 8:30am-5:00pm.  BELOW ARE SYMPTOMS THAT SHOULD BE REPORTED IMMEDIATELY:  *FEVER GREATER THAN 101.0 F  *CHILLS WITH OR WITHOUT FEVER  NAUSEA AND VOMITING THAT IS NOT CONTROLLED WITH YOUR NAUSEA MEDICATION  *UNUSUAL SHORTNESS OF BREATH  *UNUSUAL BRUISING OR BLEEDING  TENDERNESS IN MOUTH AND THROAT WITH OR WITHOUT PRESENCE OF ULCERS  *URINARY PROBLEMS  *BOWEL PROBLEMS  UNUSUAL RASH Items with * indicate a potential emergency and should be followed up as soon as possible. If you have an emergency after office hours please contact your primary care physician or go to the nearest emergency department.  Please call the clinic during office hours if you have any questions or concerns.   You may also contact the Patient Navigator at [phone] should you have any questions or need assistance in obtaining follow up care.      Resources For Cancer Patients and their Caregivers ? American Cancer Society: Can assist with transportation, wigs, general needs, runs Look Good Feel Better.        347-014-6785 ? Cancer Care: Provides financial assistance, online support groups, medication/co-pay assistance.  1-800-813-HOPE 706-797-0032) ? [place] Assists [place] cancer patients and their families through emotional , educational and financial support.  (918) 194-7248 ? [place] DSS Where to apply for food stamps, Medicaid and utility assistance. 234-296-7570 ? RCATS: Transportation to medical appointments. 732-768-5130 ? Social Security Administration: May apply for disability if have a Stage IV cancer. 8135031558 [phone])621-9357 ? [employer], Disability and Transit Services: Assists with nutrition, care and transit needs. [phone]

## 2016-02-09 ENCOUNTER — Encounter (HOSPITAL_COMMUNITY): Payer: Self-pay | Admitting: Hematology & Oncology

## 2016-02-10 ENCOUNTER — Ambulatory Visit (HOSPITAL_COMMUNITY): Payer: Self-pay

## 2016-02-11 ENCOUNTER — Ambulatory Visit (HOSPITAL_COMMUNITY): Payer: Self-pay

## 2016-02-12 ENCOUNTER — Ambulatory Visit (HOSPITAL_COMMUNITY): Payer: Self-pay

## 2016-03-02 ENCOUNTER — Encounter (HOSPITAL_COMMUNITY): Payer: Medicaid Other | Attending: Hematology & Oncology

## 2016-03-02 VITALS — BP 124/66 | HR 68 | Temp 98.2°F | Resp 18 | Wt 220.4 lb

## 2016-03-02 DIAGNOSIS — C189 Malignant neoplasm of colon, unspecified: Secondary | ICD-10-CM

## 2016-03-02 DIAGNOSIS — Z5111 Encounter for antineoplastic chemotherapy: Secondary | ICD-10-CM

## 2016-03-02 DIAGNOSIS — C787 Secondary malignant neoplasm of liver and intrahepatic bile duct: Secondary | ICD-10-CM | POA: Insufficient documentation

## 2016-03-02 DIAGNOSIS — Z23 Encounter for immunization: Secondary | ICD-10-CM

## 2016-03-02 DIAGNOSIS — C7801 Secondary malignant neoplasm of right lung: Secondary | ICD-10-CM | POA: Diagnosis not present

## 2016-03-02 DIAGNOSIS — C187 Malignant neoplasm of sigmoid colon: Secondary | ICD-10-CM | POA: Diagnosis not present

## 2016-03-02 DIAGNOSIS — C7802 Secondary malignant neoplasm of left lung: Secondary | ICD-10-CM

## 2016-03-02 LAB — CBC WITH DIFFERENTIAL/PLATELET
BASOS ABS: 0 10*3/uL (ref 0.0–0.1)
BASOS PCT: 0 %
Eosinophils Absolute: 0.2 10*3/uL (ref 0.0–0.7)
Eosinophils Relative: 4 %
HEMATOCRIT: 39.1 % (ref 39.0–52.0)
Hemoglobin: 13.2 g/dL (ref 13.0–17.0)
Lymphocytes Relative: 31 %
Lymphs Abs: 1.9 10*3/uL (ref 0.7–4.0)
MCH: 29.4 pg (ref 26.0–34.0)
MCHC: 33.8 g/dL (ref 30.0–36.0)
MCV: 87.1 fL (ref 78.0–100.0)
MONO ABS: 0.3 10*3/uL (ref 0.1–1.0)
Monocytes Relative: 5 %
NEUTROS ABS: 3.6 10*3/uL (ref 1.7–7.7)
NEUTROS PCT: 60 %
PLATELETS: 201 10*3/uL (ref 150–400)
RBC: 4.49 MIL/uL (ref 4.22–5.81)
RDW: 14.6 % (ref 11.5–15.5)
WBC: 6 10*3/uL (ref 4.0–10.5)

## 2016-03-02 LAB — COMPREHENSIVE METABOLIC PANEL
ALBUMIN: 4 g/dL (ref 3.5–5.0)
ALT: 26 U/L (ref 17–63)
AST: 28 U/L (ref 15–41)
Alkaline Phosphatase: 90 U/L (ref 38–126)
Anion gap: 7 (ref 5–15)
BILIRUBIN TOTAL: 0.5 mg/dL (ref 0.3–1.2)
BUN: 23 mg/dL — AB (ref 6–20)
CHLORIDE: 106 mmol/L (ref 101–111)
CO2: 24 mmol/L (ref 22–32)
Calcium: 8.9 mg/dL (ref 8.9–10.3)
Creatinine, Ser: 0.98 mg/dL (ref 0.61–1.24)
GFR calc Af Amer: >60 mL/min (ref >60)
GFR calc non Af Amer: >60 mL/min (ref >60)
GLUCOSE: 128 mg/dL — AB (ref 65–99)
POTASSIUM: 3.7 mmol/L (ref 3.5–5.1)
Sodium: 137 mmol/L (ref 135–145)
Total Protein: 7 g/dL (ref 6.5–8.1)

## 2016-03-02 MED ORDER — SODIUM CHLORIDE 0.9 % IJ SOLN
10.0000 mL | INTRAMUSCULAR | Status: DC | PRN
  Administered 2016-03-02: 10 mL
  Filled 2016-03-02: qty 10

## 2016-03-02 MED ORDER — SODIUM CHLORIDE 0.9 % IV SOLN
8.0000 mg | Freq: Once | INTRAVENOUS | Status: AC
  Administered 2016-03-02: 8 mg via INTRAVENOUS
  Filled 2016-03-02: qty 4

## 2016-03-02 MED ORDER — PROCHLORPERAZINE MALEATE 10 MG PO TABS
10.0000 mg | ORAL_TABLET | Freq: Once | ORAL | Status: AC
  Administered 2016-03-02: 10 mg via ORAL
  Filled 2016-03-02: qty 1

## 2016-03-02 MED ORDER — LEUCOVORIN CALCIUM INJECTION 100 MG
20.0000 mg/m2 | Freq: Once | INTRAMUSCULAR | Status: AC
  Administered 2016-03-02: 42 mg via INTRAVENOUS
  Filled 2016-03-02: qty 2.1

## 2016-03-02 MED ORDER — HEPARIN SOD (PORK) LOCK FLUSH 100 UNIT/ML IV SOLN
500.0000 [IU] | Freq: Once | INTRAVENOUS | Status: AC | PRN
  Administered 2016-03-02: 500 [IU]
  Filled 2016-03-02: qty 5

## 2016-03-02 MED ORDER — INFLUENZA VAC SPLIT QUAD 0.5 ML IM SUSY
0.5000 mL | PREFILLED_SYRINGE | Freq: Once | INTRAMUSCULAR | Status: AC
  Administered 2016-03-02: 0.5 mL via INTRAMUSCULAR
  Filled 2016-03-02: qty 0.5

## 2016-03-02 MED ORDER — SODIUM CHLORIDE 0.9 % IV SOLN
Freq: Once | INTRAVENOUS | Status: AC
  Administered 2016-03-02: 11:00:00 via INTRAVENOUS

## 2016-03-02 MED ORDER — SODIUM CHLORIDE 0.9 % IV SOLN
340.0000 mg/m2 | Freq: Once | INTRAVENOUS | Status: AC
  Administered 2016-03-02: 700 mg via INTRAVENOUS
  Filled 2016-03-02: qty 14

## 2016-03-02 NOTE — Progress Notes (Signed)
Herny Hewes Arel tolerated chemo tx and Influenza vaccine well without complaints or incident. Pt discharged with port left accessed and saline locked for use tomorrow. Port flushed per protocol. Pt discharged self ambulatory in satisfactory condition

## 2016-03-02 NOTE — Patient Instructions (Signed)
[facility] Discharge Instructions for Patients Receiving Chemotherapy   Beginning January 23rd 2017 lab work for the [facility] will be done in the  Main lab at [facility] on 1st floor. If you have a lab appointment with the [facility] please come in thru the  Main Entrance and check in at the main information desk   Today you received the following chemotherapy agents Leucovorin and 5FU as well as Influenza vaccine. Follow-up as scheduled, Call clinic for any questions or concerns  To help prevent nausea and vomiting after your treatment, we encourage you to take your nausea medication   If you develop nausea and vomiting, or diarrhea that is not controlled by your medication, call the clinic.  The clinic phone number is [phone]. Office hours are Monday-Friday 8:30am-5:00pm.  BELOW ARE SYMPTOMS THAT SHOULD BE REPORTED IMMEDIATELY:  *FEVER GREATER THAN 101.0 F  *CHILLS WITH OR WITHOUT FEVER  NAUSEA AND VOMITING THAT IS NOT CONTROLLED WITH YOUR NAUSEA MEDICATION  *UNUSUAL SHORTNESS OF BREATH  *UNUSUAL BRUISING OR BLEEDING  TENDERNESS IN MOUTH AND THROAT WITH OR WITHOUT PRESENCE OF ULCERS  *URINARY PROBLEMS  *BOWEL PROBLEMS  UNUSUAL RASH Items with * indicate a potential emergency and should be followed up as soon as possible. If you have an emergency after office hours please contact your primary care physician or go to the nearest emergency department.  Please call the clinic during office hours if you have any questions or concerns.   You may also contact the Patient Navigator at [phone] should you have any questions or need assistance in obtaining follow up care.      Resources For Cancer Patients and their Caregivers ? American Cancer Society: Can assist with transportation, wigs, general needs, runs Look Good Feel Better.        [phone] ? Cancer Care: Provides financial assistance, online support groups,  medication/co-pay assistance.  1-800-813-HOPE 947-853-7197) ? [place] Assists [place] cancer patients and their families through emotional , educational and financial support.  614-735-6512 ? [place] DSS Where to apply for food stamps, Medicaid and utility assistance. 802-805-2008 ? RCATS: Transportation to medical appointments. 639-270-1804 ? Social Security Administration: May apply for disability if have a Stage IV cancer. 316-009-1497 [phone] ? [employer], Disability and Transit Services: Assists with nutrition, care and transit needs. [phone]

## 2016-03-03 ENCOUNTER — Encounter (HOSPITAL_BASED_OUTPATIENT_CLINIC_OR_DEPARTMENT_OTHER): Payer: Medicaid Other

## 2016-03-03 ENCOUNTER — Encounter (HOSPITAL_BASED_OUTPATIENT_CLINIC_OR_DEPARTMENT_OTHER): Payer: Medicaid Other | Admitting: Oncology

## 2016-03-03 ENCOUNTER — Encounter (HOSPITAL_COMMUNITY): Payer: Self-pay | Admitting: Oncology

## 2016-03-03 VITALS — BP 129/62 | HR 56 | Temp 97.8°F | Resp 16 | Ht 67.0 in | Wt 220.0 lb

## 2016-03-03 VITALS — BP 115/65 | HR 67 | Temp 98.3°F | Resp 18

## 2016-03-03 DIAGNOSIS — Z5111 Encounter for antineoplastic chemotherapy: Secondary | ICD-10-CM | POA: Diagnosis not present

## 2016-03-03 DIAGNOSIS — C787 Secondary malignant neoplasm of liver and intrahepatic bile duct: Secondary | ICD-10-CM | POA: Diagnosis not present

## 2016-03-03 DIAGNOSIS — C189 Malignant neoplasm of colon, unspecified: Secondary | ICD-10-CM

## 2016-03-03 DIAGNOSIS — M545 Low back pain, unspecified: Secondary | ICD-10-CM

## 2016-03-03 DIAGNOSIS — C7800 Secondary malignant neoplasm of unspecified lung: Secondary | ICD-10-CM

## 2016-03-03 LAB — CEA: CEA: 4.4 ng/mL (ref 0.0–4.7)

## 2016-03-03 MED ORDER — PROCHLORPERAZINE MALEATE 10 MG PO TABS
10.0000 mg | ORAL_TABLET | Freq: Once | ORAL | Status: AC
  Administered 2016-03-03: 10 mg via ORAL

## 2016-03-03 MED ORDER — CYCLOBENZAPRINE HCL 10 MG PO TABS: 10.0000 mg | ORAL_TABLET | Freq: Three times a day (TID) | ORAL | 0 refills | Status: AC | PRN

## 2016-03-03 MED ORDER — HEPARIN SOD (PORK) LOCK FLUSH 100 UNIT/ML IV SOLN
500.0000 [IU] | Freq: Once | INTRAVENOUS | Status: AC | PRN
  Administered 2016-03-03: 500 [IU]

## 2016-03-03 MED ORDER — SODIUM CHLORIDE 0.9 % IJ SOLN
10.0000 mL | INTRAMUSCULAR | Status: DC | PRN
  Administered 2016-03-03: 10 mL
  Filled 2016-03-03: qty 10

## 2016-03-03 MED ORDER — LEUCOVORIN CALCIUM INJECTION 100 MG
20.0000 mg/m2 | Freq: Once | INTRAMUSCULAR | Status: AC
  Administered 2016-03-03: 42 mg via INTRAVENOUS
  Filled 2016-03-03: qty 2.1

## 2016-03-03 MED ORDER — SODIUM CHLORIDE 0.9 % IV SOLN
8.0000 mg | Freq: Once | INTRAVENOUS | Status: AC
  Administered 2016-03-03: 8 mg via INTRAVENOUS
  Filled 2016-03-03: qty 4

## 2016-03-03 MED ORDER — PROCHLORPERAZINE MALEATE 10 MG PO TABS
ORAL_TABLET | ORAL | Status: AC
  Filled 2016-03-03: qty 1

## 2016-03-03 MED ORDER — SODIUM CHLORIDE 0.9 % IV SOLN
Freq: Once | INTRAVENOUS | Status: AC
  Administered 2016-03-03: 10:00:00 via INTRAVENOUS

## 2016-03-03 MED ORDER — OXYCODONE-ACETAMINOPHEN 5-325 MG PO TABS: 1.0000 | ORAL_TABLET | ORAL | 0 refills | Status: AC | PRN

## 2016-03-03 MED ORDER — SODIUM CHLORIDE 0.9 % IV SOLN
340.0000 mg/m2 | Freq: Once | INTRAVENOUS | Status: AC
  Administered 2016-03-03: 700 mg via INTRAVENOUS
  Filled 2016-03-03: qty 14

## 2016-03-03 NOTE — Patient Instructions (Signed)
[place] [facility] Discharge Instructions  RECOMMENDATIONS MADE BY THE CONSULTANT AND ANY TEST RESULTS WILL BE SENT TO YOUR REFERRING PHYSICIAN.  You were seen today by [name] PA-C. Take Ibuprofen 200-400mg  two times a day, and flexeril for back pain.  Refill of Oxycodone given.  Follow up in 4 weeks for office visit and treatment.    Thank you for choosing [place] at [facility] to provide your oncology and hematology care.  To afford each patient quality time with our provider, please arrive at least 15 minutes before your scheduled appointment time.   Beginning January 23rd 2017 lab work for the [name] will be done in the  Main lab at [facility] on 1st floor. If you have a lab appointment with the [facility] please come in thru the  Main Entrance and check in at the main information desk  You need to re-schedule your appointment should you arrive 10 or more minutes late.  We strive to give you quality time with our providers, and arriving late affects you and other patients whose appointments are after yours.  Also, if you no show three or more times for appointments you may be dismissed from the clinic at the providers discretion.     Again, thank you for choosing [facility].  Our hope is that these requests will decrease the amount of time that you wait before being seen by our physicians.       _____________________________________________________________  Should you have questions after your visit to [facility], please contact our office at [phone] between the hours of 8:30 a.m. and 4:30 p.m.  Voicemails left after 4:30 p.m. will not be returned until the following business day.  For prescription refill requests, have your pharmacy contact our office.         Resources For Cancer Patients and their Caregivers ? American Cancer Society: Can assist with transportation, wigs,  general needs, runs Look Good Feel Better.        (720)728-8995 ? Cancer Care: Provides financial assistance, online support groups, medication/co-pay assistance.  1-800-813-HOPE (802) 074-3678) ? [place] Assists [place] cancer patients and their families through emotional , educational and financial support.  (830)640-0235 ? [place] DSS Where to apply for food stamps, Medicaid and utility assistance. 9060617858 ? RCATS: Transportation to medical appointments. (740)320-9077 ? Social Security Administration: May apply for disability if have a Stage IV cancer. 747-131-9257 506 524 2067 ? [employer], Disability and Transit Services: Assists with nutrition, care and transit needs. [place] Support Programs: @10RELATIVEDAYS @ > Cancer Support Group  2nd Tuesday of the month 1pm-2pm, Journey Room  > Creative Journey  3rd Tuesday of the month 1130am-1pm, Journey Room  > Look Good Feel Better  1st Wednesday of the month 10am-12 noon, Journey Room (Call [place] to register [phone])

## 2016-03-03 NOTE — Assessment & Plan Note (Addendum)
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  As a result, he was switched to Xeloda 2500 mg BID, 7 days on and 7 days as a maintenance treatment.  In October 2016, this treatment became cost-prohibitive and therefore, on 04/15/2015, he was transitioned to treatment with 5FU/Leucovorin in a days 1-5 every 28 day fashion.  Genetic Counseling is complete and is negative.  Oncology history is updated.  Labs yesterday: CBC diff, CMET, CEA.  I personally reviewed and went over laboratory results with the patient.  The results are noted within this dictation.  CEA is noted to be WNL, but up to 4.4.  I am concerned for impending failure.  Will continue to monitor and I would have a low threshold to restage him with imaging.  If CEA is higher at next check, we will get him set-up for restaging tests.  His back pain is secondary to a right lower muscle strain.  He is advised to take 200-400 mg of Ibuprofen BID PRN and I will write an Rx for a few day course of Flexeril 10 mg.  I have provided a refill on Percocet.  Labs in 4 weeks: CBC diff, CMET, CEA.  Return in 4 weeks for follow-up and ongoing treatment.

## 2016-03-03 NOTE — Progress Notes (Signed)
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  INTERVAL HISTORY: Tom [name] 50 y.o. 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  KRAS WT.    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 Appearance is most likely to represent diffuse hepatic metastasis      07/30/2014 Initial Diagnosis    Colon, biopsy, sigmoid - ADENOCARCINOMA.      07/31/2014 Tumor Marker    CEA- 48.0      07/31/2014 Imaging    Ct abd/pelvis- concerning for primary colorectal neoplasm in the mid to distal sigmoid colon, with lymphadenopathy in the sigmoid mesocolon, ileocolic mesentery, and retroperitoneum, as well as widespread metastatic disease to the liver      08/01/2014 Imaging    CT chest- Pathologic thoracic, right hilar, and infrahilar adenopathy associated with a masslike region of possible consolidation in the right middle lobe, surrounding nodularity, and some scattered bilateral pulmonary nodules.      08/06/2014 - 12/10/2014 Chemotherapy    FOLFOX.  Avastin added for cycle 2.  S/P 10 cycles.  Change to maintenance therapy secondary to positive response to therapy, increasing PN, and progressive proteinuria.      10/08/2014 Adverse Reaction    Avastin induced proteinuria >/= 2 gm Avastin on hold      10/15/2014 Treatment Plan Change    Avastin  held for proteinuria.      10/22/2014 Imaging    Ct CAP- 1. Interval decrease and soft tissue adjacent to the mid sigmoid colon with decreasing sigmoid colon wall thickening. The  leocolic, mesenteric, perirectal, and mesocolonic lymphadenopathy has decreased in the interval.      10/30/2014 Survivorship    Genetic Counseling. Genetic testing was normal, and did not reveal a deleterious mutation in these genes. The test report will be scanned into EPIC and will be located under the Media tab.       11/26/2014 Treatment Plan Change    Oxaliplatin reduced x 25% due to PN.      12/24/2014 Treatment Plan Change    Change to maintanence therapy.      01/09/2015 - 03/26/2015 Chemotherapy    Xeloda 2500 mg BID, 7 days on and 7 days off.      01/30/2015 Imaging    CT in ED- Evidence of distal small bowel obstruction with transition point over the ileum in the right upper pelvis immediately adjacent/abutting the known malignant stricturing of the sigmoid colon. There is wall thickening of the sigmoid colon wi...      01/30/2015 - 01/31/2015 Hospital Admission    SBO (small bowel obstruction)      03/26/2015 Treatment Plan Change    Xeloda cost-prohibitive without financial help      04/15/2015 -  Chemotherapy    5FU/Leucovorin days 1-5 every 28 days      05/13/2015 Adverse Reaction  Palmar-Plantar erythrodysesthesia      05/13/2015 Treatment Plan Change    5FU/Leucovorin held      05/28/2015 Treatment Plan Change    5FU dose reduced by 20%      07/03/2015 Imaging    CT CAP- Resolution right-sided pulmonary nodules and decrease in size of small mediastinal nodes. Response to therapy of hepatic metastasis. Similar to slight increase in suspicious nodes in the high mesorectum.      10/29/2015 Imaging    CT CAP- Further reduction in size of the hepatic metastatic tumors, compatible with interval effective therapy. 2 mm left upper lobe pulmonary nodule has been stable over the past  15 months. Likely benign, may merit surveillance.       He notes a right low back pain that is exacerbated by movement and deep palpation.  It is lateral to the spine.  He denies any trauma.  He notes that it comes and goes and is a "grabbing pain" when it occurs.  Weight is stable.  Review of Systems  Constitutional: Negative.  Negative for chills and fever.  HENT: Negative.   Eyes: Negative.   Respiratory: Negative.  Negative for cough, hemoptysis and shortness of breath.   Cardiovascular: Negative.  Negative for chest pain.  Gastrointestinal: Negative.  Negative for blood in stool, constipation, diarrhea, melena, nausea and vomiting.  Genitourinary: Negative.   Musculoskeletal: Positive for back pain (right low back).  Skin: Negative.   Neurological: Negative.   Endo/Heme/Allergies: Negative.   Psychiatric/Behavioral: Negative.     Past Medical History:  Diagnosis Date  . 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 COLONOSCOPY N/A 07/30/2014   Procedure: COLONOSCOPY;  Surgeon: Inda Castle, MD;  Location: [facility] ENDOSCOPY;  Service: Endoscopy;  Laterality: N/A;  . ESOPHAGOGASTRODUODENOSCOPY N/A 07/30/2014   Procedure: ESOPHAGOGASTRODUODENOSCOPY (EGD);  Surgeon: Inda Castle, MD;  Location: [facility];  Service: Endoscopy;  Laterality: N/A;  . OPERATIVE ULTRASOUND N/A 08/01/2014   Procedure: OPERATIVE ULTRASOUND;  Surgeon: Gayland Curry, MD;  Location: [facility] ORS;  Service: General;  Laterality: N/A;  . PORTACATH PLACEMENT Right 08/01/14  . PORTACATH PLACEMENT N/A 08/01/2014   Procedure: INSERTION RIGHT IJ PORT-A-CATH WITH ULTRASOUND AND FLUORO;  Surgeon: Gayland Curry, MD;  Location: [facility] ORS;  Service: General;  Laterality: N/A;    Family History  Problem Relation Age of Onset  . Cancer Mother     Deceased at 33 years old from metastatic colon cancer  . Kidney failure Father   . Heart disease Father      Deceased in 40-50's  . Colon polyps Brother   . Cancer Maternal Aunt     Social History   Social History  . Marital status: Married    Spouse name: N/A  . Number of children: N/A  . Years of education: N/A   Social History Main Topics  . Smoking status: Never Smoker  . Smokeless tobacco: Former Systems developer    Types: Chew    Quit date: 06/01/1989  . Alcohol use No  . Drug use: No  . Sexual activity: Not Asked   Other Topics Concern  . None   Social History Narrative  . None     PHYSICAL EXAMINATION  ECOG PERFORMANCE STATUS: 1 - Symptomatic but completely ambulatory  Vitals:   03/03/16 0950  BP: 129/62  Pulse: (!) 56  Resp: 16  Temp: 97.8 F (36.6 C)    GENERAL:alert,  no distress, well nourished, well developed, comfortable, cooperative, obese, smiling and unaccompanied, in chemo-recliner SKIN: skin color, texture, turgor are normal, no rashes or significant lesions HEAD: Normocephalic, No masses, lesions, tenderness or abnormalities EYES: normal, EOMI, Conjunctiva are pink and non-injected EARS: External ears normal OROPHARYNX:lips, buccal mucosa, and tongue normal and mucous membranes are moist  NECK: supple, no adenopathy, trachea midline LYMPH:  no palpable lymphadenopathy BREAST:not examined LUNGS: clear to auscultation and percussion HEART: regular rate & rhythm, no murmurs, no gallops, S1 normal and S2 normal ABDOMEN:abdomen soft, non-tender, obese and normal bowel sounds BACK: Back symmetric, no curvature.  Right low back muscle is tight and tender. EXTREMITIES:less then 2 second capillary refill, no joint deformities, effusion, or inflammation, no skin discoloration, no cyanosis  NEURO: alert & oriented x 3 with fluent speech, no focal motor/sensory deficits, gait normal   LABORATORY DATA: CBC    Component Value Date/Time   WBC 6.0 03/02/2016 1004   RBC 4.49 03/02/2016 1004   HGB 13.2 03/02/2016 1004   HCT 39.1 03/02/2016 1004   PLT 201 03/02/2016 1004    MCV 87.1 03/02/2016 1004   MCH 29.4 03/02/2016 1004   MCHC 33.8 03/02/2016 1004   RDW 14.6 03/02/2016 1004   LYMPHSABS 1.9 03/02/2016 1004   MONOABS 0.3 03/02/2016 1004   EOSABS 0.2 03/02/2016 1004   BASOSABS 0.0 03/02/2016 1004      Chemistry      Component Value Date/Time   NA 137 03/02/2016 1004   K 3.7 03/02/2016 1004   CL 106 03/02/2016 1004   CO2 24 03/02/2016 1004   BUN 23 (H) 03/02/2016 1004   CREATININE 0.98 03/02/2016 1004      Component Value Date/Time   CALCIUM 8.9 03/02/2016 1004   ALKPHOS 90 03/02/2016 1004   AST 28 03/02/2016 1004   ALT 26 03/02/2016 1004   BILITOT 0.5 03/02/2016 1004     Lab Results  Component Value Date   CEA 4.4 03/02/2016     PENDING LABS:   RADIOGRAPHIC STUDIES:  No results found.   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  As a result, he was switched to Xeloda 2500 mg BID, 7 days on and 7 days as a maintenance treatment.  In October 2016, this treatment became cost-prohibitive and therefore, on 04/15/2015, he was transitioned to treatment with 5FU/Leucovorin in a days 1-5 every 28 day fashion.  Genetic Counseling is complete and is negative.  Oncology history is updated.  Labs yesterday: CBC diff, CMET, CEA.  I personally reviewed and went over laboratory results with the patient.  The results are noted within this dictation.  CEA is noted to be WNL, but up to 4.4.  I am concerned for impending failure.  Will continue to monitor and I would have a low threshold to restage him with imaging.  If CEA is higher at next check, we will get him set-up for restaging tests.  His back pain is secondary to a right lower muscle strain.  He is advised to take 200-400 mg of Ibuprofen BID  PRN and I will write an Rx for a few day course of Flexeril 10 mg.  I have provided a refill on Percocet.  Labs in 4 weeks: CBC diff, CMET, CEA.  Return in 4 weeks for follow-up and ongoing treatment.    ORDERS PLACED FOR THIS ENCOUNTER: No orders of the defined types were placed in this encounter.   MEDICATIONS PRESCRIBED THIS ENCOUNTER: Meds ordered this encounter  Medications  . cyclobenzaprine (FLEXERIL) 10 MG tablet    Sig: Take 1 tablet (10 mg total) by mouth 3 (three) times daily as needed for muscle spasms.    Dispense:  30 tablet    Refill:  0    Order Specific Question:   Supervising Provider    Answer:   Patrici Ranks [id number]  . oxyCODONE-acetaminophen (PERCOCET/ROXICET) 5-325 MG tablet    Sig: Take 1-2 tablets by mouth every 4 (four) hours as needed for moderate pain.    Dispense:  60 tablet    Refill:  0    Order Specific Question:   Supervising Provider    Answer:   Patrici Ranks [id number]    THERAPY PLAN:  Continue palliative treatment with 5FU/Leucovorin while monitoring for failure of therapy/progression of disease.  All questions were answered. The patient knows to call the clinic with any problems, questions or concerns. We can certainly see the patient much sooner if necessary.  Patient and plan discussed with Dr. Ancil Linsey and she is in agreement with the aforementioned.   This note is electronically signed by: Doy Mince 03/03/2016 10:16 AM

## 2016-03-03 NOTE — Progress Notes (Signed)
Chemotherapy given today per orders. Patient tolerated well without problems. Vitals stable, discharged home from clinic, ambulatory self.

## 2016-03-03 NOTE — Patient Instructions (Signed)
[facility] Discharge Instructions for Patients Receiving Chemotherapy   Beginning January 23rd 2017 lab work for the [facility] will be done in the  Main lab at [facility] on 1st floor. If you have a lab appointment with the [place] please come in thru the  Main Entrance and check in at the main information desk   Today you received the following chemotherapy agents 5FU, leucovorin.  To help prevent nausea and vomiting after your treatment, we encourage you to take your nausea medication      If you develop nausea and vomiting, or diarrhea that is not controlled by your medication, call the clinic.  The clinic phone number is [phone]. Office hours are Monday-Friday 8:30am-5:00pm.  BELOW ARE SYMPTOMS THAT SHOULD BE REPORTED IMMEDIATELY:  *FEVER GREATER THAN 101.0 F  *CHILLS WITH OR WITHOUT FEVER  NAUSEA AND VOMITING THAT IS NOT CONTROLLED WITH YOUR NAUSEA MEDICATION  *UNUSUAL SHORTNESS OF BREATH  *UNUSUAL BRUISING OR BLEEDING  TENDERNESS IN MOUTH AND THROAT WITH OR WITHOUT PRESENCE OF ULCERS  *URINARY PROBLEMS  *BOWEL PROBLEMS  UNUSUAL RASH Items with * indicate a potential emergency and should be followed up as soon as possible. If you have an emergency after office hours please contact your primary care physician or go to the nearest emergency department.  Please call the clinic during office hours if you have any questions or concerns.   You may also contact the Patient Navigator at [phone] should you have any questions or need assistance in obtaining follow up care.      Resources For Cancer Patients and their Caregivers ? American Cancer Society: Can assist with transportation, wigs, general needs, runs Look Good Feel Better.        305-327-0664 ? Cancer Care: Provides financial assistance, online support groups, medication/co-pay assistance.  1-800-813-HOPE (820)533-1356) ? [place] Assists  [place] cancer patients and their families through emotional , educational and financial support.  (867)424-2791 ? [place] DSS Where to apply for food stamps, Medicaid and utility assistance. ([phone] ? [employer]: Transportation to medical appointments. 225-360-3976 ? Social Security Administration: May apply for disability if have a Stage IV cancer. 737-857-2704 763-353-8908 ? [employer], Disability and Transit Services: Assists with nutrition, care and transit needs. [phone]

## 2016-03-04 ENCOUNTER — Encounter (HOSPITAL_COMMUNITY): Payer: Self-pay

## 2016-03-04 ENCOUNTER — Encounter (HOSPITAL_BASED_OUTPATIENT_CLINIC_OR_DEPARTMENT_OTHER): Payer: Medicaid Other

## 2016-03-04 ENCOUNTER — Ambulatory Visit (HOSPITAL_COMMUNITY): Payer: Self-pay

## 2016-03-04 VITALS — BP 122/61 | HR 62 | Temp 98.2°F | Resp 18 | Wt 221.6 lb

## 2016-03-04 DIAGNOSIS — Z5111 Encounter for antineoplastic chemotherapy: Secondary | ICD-10-CM | POA: Diagnosis present

## 2016-03-04 DIAGNOSIS — C189 Malignant neoplasm of colon, unspecified: Secondary | ICD-10-CM | POA: Diagnosis not present

## 2016-03-04 DIAGNOSIS — C787 Secondary malignant neoplasm of liver and intrahepatic bile duct: Secondary | ICD-10-CM | POA: Diagnosis not present

## 2016-03-04 MED ORDER — SODIUM CHLORIDE 0.9 % IJ SOLN
10.0000 mL | INTRAMUSCULAR | Status: DC | PRN
  Administered 2016-03-04: 10 mL
  Filled 2016-03-04: qty 10

## 2016-03-04 MED ORDER — HEPARIN SOD (PORK) LOCK FLUSH 100 UNIT/ML IV SOLN
500.0000 [IU] | Freq: Once | INTRAVENOUS | Status: AC | PRN
  Administered 2016-03-04: 500 [IU]
  Filled 2016-03-04: qty 5

## 2016-03-04 MED ORDER — PROCHLORPERAZINE MALEATE 10 MG PO TABS
ORAL_TABLET | ORAL | Status: AC
  Filled 2016-03-04: qty 1

## 2016-03-04 MED ORDER — PROCHLORPERAZINE MALEATE 10 MG PO TABS
10.0000 mg | ORAL_TABLET | Freq: Once | ORAL | Status: AC
  Administered 2016-03-04: 10 mg via ORAL

## 2016-03-04 MED ORDER — FLUOROURACIL CHEMO INJECTION 2.5 GM/50ML
340.0000 mg/m2 | Freq: Once | INTRAVENOUS | Status: AC
  Administered 2016-03-04: 700 mg via INTRAVENOUS
  Filled 2016-03-04: qty 14

## 2016-03-04 MED ORDER — LEUCOVORIN CALCIUM INJECTION 100 MG
20.0000 mg/m2 | Freq: Once | INTRAMUSCULAR | Status: AC
  Administered 2016-03-04: 42 mg via INTRAVENOUS
  Filled 2016-03-04: qty 2.1

## 2016-03-04 MED ORDER — SODIUM CHLORIDE 0.9 % IV SOLN
8.0000 mg | Freq: Once | INTRAVENOUS | Status: AC
  Administered 2016-03-04: 8 mg via INTRAVENOUS
  Filled 2016-03-04: qty 4

## 2016-03-04 MED ORDER — SODIUM CHLORIDE 0.9 % IV SOLN
Freq: Once | INTRAVENOUS | Status: AC
  Administered 2016-03-04: 08:00:00 via INTRAVENOUS

## 2016-03-04 NOTE — Patient Instructions (Signed)
[facility] Discharge Instructions for Patients Receiving Chemotherapy   Beginning January 23rd 2017 lab work for the [facility] will be done in the  Main lab at [facility] on 1st floor. If you have a lab appointment with the [place] please come in thru the  Main Entrance and check in at the main information desk   Today you received the following chemotherapy agents 5FU, leucovorin  To help prevent nausea and vomiting after your treatment, we encourage you to take your nausea medication     If you develop nausea and vomiting, or diarrhea that is not controlled by your medication, call the clinic.  The clinic phone number is [phone]. Office hours are Monday-Friday 8:30am-5:00pm.  BELOW ARE SYMPTOMS THAT SHOULD BE REPORTED IMMEDIATELY:  *FEVER GREATER THAN 101.0 F  *CHILLS WITH OR WITHOUT FEVER  NAUSEA AND VOMITING THAT IS NOT CONTROLLED WITH YOUR NAUSEA MEDICATION  *UNUSUAL SHORTNESS OF BREATH  *UNUSUAL BRUISING OR BLEEDING  TENDERNESS IN MOUTH AND THROAT WITH OR WITHOUT PRESENCE OF ULCERS  *URINARY PROBLEMS  *BOWEL PROBLEMS  UNUSUAL RASH Items with * indicate a potential emergency and should be followed up as soon as possible. If you have an emergency after office hours please contact your primary care physician or go to the nearest emergency department.  Please call the clinic during office hours if you have any questions or concerns.   You may also contact the Patient Navigator at [phone] should you have any questions or need assistance in obtaining follow up care.      Resources For Cancer Patients and their Caregivers ? American Cancer Society: Can assist with transportation, wigs, general needs, runs Look Good Feel Better.        ([phone] ? Cancer Care: Provides financial assistance, online support groups, medication/co-pay assistance.  1-800-813-HOPE 813-119-5599) ? [place] Assists [place] cancer patients and their families through emotional , educational and financial support.  941-218-1727 ? [place] DSS Where to apply for food stamps, Medicaid and utility assistance. 919-766-5655 ? RCATS: Transportation to medical appointments. 385-843-9584 ? Social Security Administration: May apply for disability if have a Stage IV cancer. 6517948227 [phone])674-6161 ? [employer], Disability and Transit Services: Assists with nutrition, care and transit needs. [phone]

## 2016-03-04 NOTE — Progress Notes (Signed)
Chemotherapy given today per orders. Vitals stable, tolerated chemo without problems. Discharged from clinic ambulatory, self.

## 2016-03-05 ENCOUNTER — Encounter (HOSPITAL_BASED_OUTPATIENT_CLINIC_OR_DEPARTMENT_OTHER): Payer: Medicaid Other

## 2016-03-05 ENCOUNTER — Encounter (HOSPITAL_COMMUNITY): Payer: Self-pay

## 2016-03-05 VITALS — BP 111/69 | HR 64 | Temp 98.3°F | Resp 18 | Wt 219.6 lb

## 2016-03-05 DIAGNOSIS — Z5111 Encounter for antineoplastic chemotherapy: Secondary | ICD-10-CM

## 2016-03-05 DIAGNOSIS — C787 Secondary malignant neoplasm of liver and intrahepatic bile duct: Secondary | ICD-10-CM | POA: Diagnosis not present

## 2016-03-05 DIAGNOSIS — C189 Malignant neoplasm of colon, unspecified: Secondary | ICD-10-CM

## 2016-03-05 MED ORDER — PROCHLORPERAZINE MALEATE 10 MG PO TABS
ORAL_TABLET | ORAL | Status: AC
  Filled 2016-03-05: qty 1

## 2016-03-05 MED ORDER — SODIUM CHLORIDE 0.9 % IV SOLN
8.0000 mg | Freq: Once | INTRAVENOUS | Status: AC
  Administered 2016-03-05: 8 mg via INTRAVENOUS
  Filled 2016-03-05: qty 4

## 2016-03-05 MED ORDER — SODIUM CHLORIDE 0.9 % IV SOLN
Freq: Once | INTRAVENOUS | Status: AC
  Administered 2016-03-05: 09:00:00 via INTRAVENOUS

## 2016-03-05 MED ORDER — SODIUM CHLORIDE 0.9 % IJ SOLN
10.0000 mL | INTRAMUSCULAR | Status: DC | PRN
  Administered 2016-03-05: 10 mL
  Filled 2016-03-05: qty 10

## 2016-03-05 MED ORDER — SODIUM CHLORIDE 0.9 % IV SOLN
340.0000 mg/m2 | Freq: Once | INTRAVENOUS | Status: AC
  Administered 2016-03-05: 700 mg via INTRAVENOUS
  Filled 2016-03-05: qty 14

## 2016-03-05 MED ORDER — HEPARIN SOD (PORK) LOCK FLUSH 100 UNIT/ML IV SOLN
INTRAVENOUS | Status: AC
  Filled 2016-03-05: qty 5

## 2016-03-05 MED ORDER — PROCHLORPERAZINE MALEATE 10 MG PO TABS
10.0000 mg | ORAL_TABLET | Freq: Once | ORAL | Status: AC
  Administered 2016-03-05: 10 mg via ORAL

## 2016-03-05 MED ORDER — LEUCOVORIN CALCIUM INJECTION 100 MG
20.0000 mg/m2 | Freq: Once | INTRAMUSCULAR | Status: AC
  Administered 2016-03-05: 42 mg via INTRAVENOUS
  Filled 2016-03-05: qty 2.1

## 2016-03-05 MED ORDER — HEPARIN SOD (PORK) LOCK FLUSH 100 UNIT/ML IV SOLN
500.0000 [IU] | Freq: Once | INTRAVENOUS | Status: AC | PRN
  Administered 2016-03-05: 500 [IU]

## 2016-03-05 NOTE — Patient Instructions (Signed)
[facility] Discharge Instructions for Patients Receiving Chemotherapy   Beginning January 23rd 2017 lab work for the [facility] will be done in the  Main lab at [facility] on 1st floor. If you have a lab appointment with the [place] please come in thru the  Main Entrance and check in at the main information desk   Today you received the following chemotherapy agents leucovorin and 72fu.  If you develop nausea and vomiting, or diarrhea that is not controlled by your medication, call the clinic.  The clinic phone number is [phone]. Office hours are Monday-Friday 8:30am-5:00pm.  BELOW ARE SYMPTOMS THAT SHOULD BE REPORTED IMMEDIATELY:  *FEVER GREATER THAN 101.0 F  *CHILLS WITH OR WITHOUT FEVER  NAUSEA AND VOMITING THAT IS NOT CONTROLLED WITH YOUR NAUSEA MEDICATION  *UNUSUAL SHORTNESS OF BREATH  *UNUSUAL BRUISING OR BLEEDING  TENDERNESS IN MOUTH AND THROAT WITH OR WITHOUT PRESENCE OF ULCERS  *URINARY PROBLEMS  *BOWEL PROBLEMS  UNUSUAL RASH Items with * indicate a potential emergency and should be followed up as soon as possible. If you have an emergency after office hours please contact your primary care physician or go to the nearest emergency department.  Please call the clinic during office hours if you have any questions or concerns.   You may also contact the Patient Navigator at [phone] should you have any questions or need assistance in obtaining follow up care.      Resources For Cancer Patients and their Caregivers ? American Cancer Society: Can assist with transportation, wigs, general needs, runs Look Good Feel Better.        ([phone] ? Cancer Care: Provides financial assistance, online support groups, medication/co-pay assistance.  1-800-813-HOPE 928 596 5763) ? [place] Assists [place] cancer patients and their families through emotional , educational and financial support.   450-015-7727 ? [place] DSS Where to apply for food stamps, Medicaid and utility assistance. 914-180-3767 ? [employer]: Transportation to medical appointments. 320-151-9878 ? Social Security Administration: May apply for disability if have a Stage IV cancer. [phone] [phone] ? [employer], Disability and Transit Services: Assists with nutrition, care and transit needs. [phone]

## 2016-03-05 NOTE — Progress Notes (Signed)
Tolerated chemo well. Stable and ambulatory on discharge home to self. 

## 2016-03-06 ENCOUNTER — Encounter (HOSPITAL_BASED_OUTPATIENT_CLINIC_OR_DEPARTMENT_OTHER): Payer: Medicaid Other

## 2016-03-06 VITALS — BP 115/70 | HR 55 | Temp 98.0°F | Resp 18 | Wt 220.4 lb

## 2016-03-06 DIAGNOSIS — C189 Malignant neoplasm of colon, unspecified: Secondary | ICD-10-CM

## 2016-03-06 DIAGNOSIS — Z5111 Encounter for antineoplastic chemotherapy: Secondary | ICD-10-CM | POA: Diagnosis present

## 2016-03-06 DIAGNOSIS — C787 Secondary malignant neoplasm of liver and intrahepatic bile duct: Secondary | ICD-10-CM | POA: Diagnosis not present

## 2016-03-06 MED ORDER — SODIUM CHLORIDE 0.9 % IJ SOLN
10.0000 mL | INTRAMUSCULAR | Status: DC | PRN
  Administered 2016-03-06: 10 mL
  Filled 2016-03-06: qty 10

## 2016-03-06 MED ORDER — SODIUM CHLORIDE 0.9 % IV SOLN
8.0000 mg | Freq: Once | INTRAVENOUS | Status: AC
  Administered 2016-03-06: 8 mg via INTRAVENOUS
  Filled 2016-03-06: qty 4

## 2016-03-06 MED ORDER — SODIUM CHLORIDE 0.9 % IV SOLN
Freq: Once | INTRAVENOUS | Status: AC
  Administered 2016-03-06: 10:00:00 via INTRAVENOUS

## 2016-03-06 MED ORDER — LEUCOVORIN CALCIUM INJECTION 100 MG
20.0000 mg/m2 | Freq: Once | INTRAMUSCULAR | Status: AC
  Administered 2016-03-06: 42 mg via INTRAVENOUS
  Filled 2016-03-06: qty 2.1

## 2016-03-06 MED ORDER — HEPARIN SOD (PORK) LOCK FLUSH 100 UNIT/ML IV SOLN
500.0000 [IU] | Freq: Once | INTRAVENOUS | Status: AC | PRN
  Administered 2016-03-06: 500 [IU]
  Filled 2016-03-06: qty 5

## 2016-03-06 MED ORDER — PROCHLORPERAZINE MALEATE 10 MG PO TABS
10.0000 mg | ORAL_TABLET | Freq: Once | ORAL | Status: AC
  Administered 2016-03-06: 10 mg via ORAL
  Filled 2016-03-06: qty 1

## 2016-03-06 MED ORDER — SODIUM CHLORIDE 0.9 % IV SOLN
340.0000 mg/m2 | Freq: Once | INTRAVENOUS | Status: AC
  Administered 2016-03-06: 700 mg via INTRAVENOUS
  Filled 2016-03-06: qty 14

## 2016-03-06 NOTE — Progress Notes (Signed)
Alley Gore Parrott tolerated chemo tx well without complaints or incident. VSS upon discharge. Pt discharged self ambulatory in satisfactory condition

## 2016-03-06 NOTE — Patient Instructions (Signed)
[facility] Discharge Instructions for Patients Receiving Chemotherapy   Beginning January 23rd 2017 lab work for the Cancer Center will be done in the  Main lab at [facility] on 1st floor. If you have a lab appointment with the Cancer Center please come in thru the  Main Entrance and check in at the main information desk   Today you received the following chemotherapy agents Leucovorin and 5FU. Follow-up as scheduled. Call clinic for any questions or concerns  To help prevent nausea and vomiting after your treatment, we encourage you to take your nausea medication   If you develop nausea and vomiting, or diarrhea that is not controlled by your medication, call the clinic.  The clinic phone number is [phone]. Office hours are Monday-Friday 8:30am-5:00pm.  BELOW ARE SYMPTOMS THAT SHOULD BE REPORTED IMMEDIATELY:  *FEVER GREATER THAN 101.0 F  *CHILLS WITH OR WITHOUT FEVER  NAUSEA AND VOMITING THAT IS NOT CONTROLLED WITH YOUR NAUSEA MEDICATION  *UNUSUAL SHORTNESS OF BREATH  *UNUSUAL BRUISING OR BLEEDING  TENDERNESS IN MOUTH AND THROAT WITH OR WITHOUT PRESENCE OF ULCERS  *URINARY PROBLEMS  *BOWEL PROBLEMS  UNUSUAL RASH Items with * indicate a potential emergency and should be followed up as soon as possible. If you have an emergency after office hours please contact your primary care physician or go to the nearest emergency department.  Please call the clinic during office hours if you have any questions or concerns.   You may also contact the Patient Navigator at [phone] should you have any questions or need assistance in obtaining follow up care.      Resources For Cancer Patients and their Caregivers ? American Cancer Society: Can assist with transportation, wigs, general needs, runs Look Good Feel Better.        1-888-227-6333 ? Cancer Care: Provides financial assistance, online support groups, medication/co-pay assistance.   1-800-813-HOPE (4673) ? [facility] Assists [place] cancer patients and their families through emotional , educational and financial support.  336-427-4357 ? [place] DSS Where to apply for food stamps, Medicaid and utility assistance. 336-342-1394 ? [employer]: Transportation to medical appointments. 336-347-2287 ? Social Security Administration: May apply for disability if have a Stage IV cancer. [phone] 1-800-772-1213 ? [place] [employer], Disability and Transit Services: Assists with nutrition, care and transit needs. [phone]         

## 2016-03-09 ENCOUNTER — Ambulatory Visit (HOSPITAL_COMMUNITY): Payer: Self-pay

## 2016-03-30 ENCOUNTER — Encounter (HOSPITAL_COMMUNITY): Payer: Medicaid Other | Attending: Oncology | Admitting: Oncology

## 2016-03-30 ENCOUNTER — Encounter (HOSPITAL_COMMUNITY): Payer: Self-pay | Admitting: Oncology

## 2016-03-30 ENCOUNTER — Encounter (HOSPITAL_BASED_OUTPATIENT_CLINIC_OR_DEPARTMENT_OTHER): Payer: Medicaid Other

## 2016-03-30 VITALS — BP 109/69 | HR 67 | Temp 97.8°F | Resp 18

## 2016-03-30 DIAGNOSIS — C7801 Secondary malignant neoplasm of right lung: Secondary | ICD-10-CM

## 2016-03-30 DIAGNOSIS — C7802 Secondary malignant neoplasm of left lung: Secondary | ICD-10-CM

## 2016-03-30 DIAGNOSIS — Z5111 Encounter for antineoplastic chemotherapy: Secondary | ICD-10-CM | POA: Diagnosis present

## 2016-03-30 DIAGNOSIS — R21 Rash and other nonspecific skin eruption: Secondary | ICD-10-CM | POA: Diagnosis not present

## 2016-03-30 DIAGNOSIS — C787 Secondary malignant neoplasm of liver and intrahepatic bile duct: Secondary | ICD-10-CM

## 2016-03-30 DIAGNOSIS — C189 Malignant neoplasm of colon, unspecified: Secondary | ICD-10-CM | POA: Diagnosis not present

## 2016-03-30 DIAGNOSIS — M545 Low back pain: Secondary | ICD-10-CM

## 2016-03-30 DIAGNOSIS — C187 Malignant neoplasm of sigmoid colon: Secondary | ICD-10-CM | POA: Diagnosis not present

## 2016-03-30 LAB — COMPREHENSIVE METABOLIC PANEL
ALBUMIN: 4.2 g/dL (ref 3.5–5.0)
ALT: 25 U/L (ref 17–63)
ANION GAP: 7 (ref 5–15)
AST: 29 U/L (ref 15–41)
Alkaline Phosphatase: 82 U/L (ref 38–126)
BILIRUBIN TOTAL: 0.7 mg/dL (ref 0.3–1.2)
BUN: 19 mg/dL (ref 6–20)
CO2: 25 mmol/L (ref 22–32)
Calcium: 9.1 mg/dL (ref 8.9–10.3)
Chloride: 104 mmol/L (ref 101–111)
Creatinine, Ser: 1.11 mg/dL (ref 0.61–1.24)
GFR calc Af Amer: >60 mL/min (ref >60)
GLUCOSE: 142 mg/dL — AB (ref 65–99)
POTASSIUM: 3.9 mmol/L (ref 3.5–5.1)
Sodium: 136 mmol/L (ref 135–145)
TOTAL PROTEIN: 7.1 g/dL (ref 6.5–8.1)

## 2016-03-30 LAB — CBC WITH DIFFERENTIAL/PLATELET
BASOS PCT: 0 %
Basophils Absolute: 0 10*3/uL (ref 0.0–0.1)
EOS PCT: 5 %
Eosinophils Absolute: 0.4 10*3/uL (ref 0.0–0.7)
HEMATOCRIT: 40.4 % (ref 39.0–52.0)
Hemoglobin: 13.9 g/dL (ref 13.0–17.0)
Lymphocytes Relative: 27 %
Lymphs Abs: 2 10*3/uL (ref 0.7–4.0)
MCH: 30.3 pg (ref 26.0–34.0)
MCHC: 34.4 g/dL (ref 30.0–36.0)
MCV: 88.2 fL (ref 78.0–100.0)
MONO ABS: 0.5 10*3/uL (ref 0.1–1.0)
MONOS PCT: 6 %
NEUTROS ABS: 4.4 10*3/uL (ref 1.7–7.7)
Neutrophils Relative %: 62 %
Platelets: 186 10*3/uL (ref 150–400)
RBC: 4.58 MIL/uL (ref 4.22–5.81)
RDW: 14.9 % (ref 11.5–15.5)
WBC: 7.2 10*3/uL (ref 4.0–10.5)

## 2016-03-30 MED ORDER — ONDANSETRON HCL 4 MG PO TABS
8.0000 mg | ORAL_TABLET | Freq: Once | ORAL | Status: AC
  Administered 2016-03-30: 8 mg via ORAL
  Filled 2016-03-30: qty 2

## 2016-03-30 MED ORDER — SODIUM CHLORIDE 0.9 % IJ SOLN: 10.0000 mL | INTRAMUSCULAR | Status: DC | PRN

## 2016-03-30 MED ORDER — PROCHLORPERAZINE MALEATE 10 MG PO TABS
ORAL_TABLET | ORAL | Status: AC
Start: 2016-03-30 — End: 2016-03-30
  Filled 2016-03-30: qty 1

## 2016-03-30 MED ORDER — SODIUM CHLORIDE 0.9 % IV SOLN: 8.0000 mg | Freq: Once | INTRAVENOUS | Status: DC

## 2016-03-30 MED ORDER — HEPARIN SOD (PORK) LOCK FLUSH 100 UNIT/ML IV SOLN
500.0000 [IU] | Freq: Once | INTRAVENOUS | Status: AC | PRN
  Administered 2016-03-30: 500 [IU]
  Filled 2016-03-30 (×2): qty 5

## 2016-03-30 MED ORDER — PROCHLORPERAZINE MALEATE 10 MG PO TABS
10.0000 mg | ORAL_TABLET | Freq: Once | ORAL | Status: AC
  Administered 2016-03-30: 10 mg via ORAL

## 2016-03-30 MED ORDER — SODIUM CHLORIDE 0.9 % IV SOLN
340.0000 mg/m2 | Freq: Once | INTRAVENOUS | Status: AC
  Administered 2016-03-30: 700 mg via INTRAVENOUS
  Filled 2016-03-30: qty 14

## 2016-03-30 MED ORDER — LEUCOVORIN CALCIUM INJECTION 100 MG
20.0000 mg/m2 | Freq: Once | INTRAMUSCULAR | Status: AC
  Administered 2016-03-30: 42 mg via INTRAVENOUS
  Filled 2016-03-30: qty 2.1

## 2016-03-30 MED ORDER — SODIUM CHLORIDE 0.9 % IV SOLN
Freq: Once | INTRAVENOUS | Status: AC
  Administered 2016-03-30: 11:00:00 via INTRAVENOUS

## 2016-03-30 MED ORDER — PROCHLORPERAZINE MALEATE 10 MG PO TABS
ORAL_TABLET | ORAL | Status: AC
  Filled 2016-03-30: qty 1

## 2016-03-30 NOTE — Progress Notes (Addendum)
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  INTERVAL HISTORY: Tom [name] 50 y.o. 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  KRAS WT.    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 Appearance is most likely to represent diffuse hepatic metastasis      07/30/2014 Initial Diagnosis    Colon, biopsy, sigmoid - ADENOCARCINOMA.      07/31/2014 Tumor Marker    CEA- 48.0      07/31/2014 Imaging    Ct abd/pelvis- concerning for primary colorectal neoplasm in the mid to distal sigmoid colon, with lymphadenopathy in the sigmoid mesocolon, ileocolic mesentery, and retroperitoneum, as well as widespread metastatic disease to the liver      08/01/2014 Imaging    CT chest- Pathologic thoracic, right hilar, and infrahilar adenopathy associated with a masslike region of possible consolidation in the right middle lobe, surrounding nodularity, and some scattered bilateral pulmonary nodules.      08/06/2014 - 12/10/2014 Chemotherapy    FOLFOX.  Avastin added for cycle 2.  S/P 10 cycles.  Change to maintenance therapy secondary to positive response to therapy, increasing PN, and progressive proteinuria.      10/08/2014 Adverse Reaction    Avastin induced proteinuria >/= 2 gm Avastin on hold      10/15/2014 Treatment Plan Change    Avastin held for proteinuria.      10/22/2014 Imaging    Ct CAP- 1. Interval decrease and soft tissue adjacent to  the mid sigmoid colon with decreasing sigmoid colon wall thickening. The  leocolic, mesenteric, perirectal, and mesocolonic lymphadenopathy has decreased in the interval.      10/30/2014 Survivorship    Genetic Counseling. Genetic testing was normal, and did not reveal a deleterious mutation in these genes. The test report will be scanned into EPIC and will be located under the Media tab.       11/26/2014 Treatment Plan Change    Oxaliplatin reduced x 25% due to PN.      12/24/2014 Treatment Plan Change    Change to maintanence therapy.      01/09/2015 - 03/26/2015 Chemotherapy    Xeloda 2500 mg BID, 7 days on and 7 days off.      01/30/2015 Imaging    CT in ED- Evidence of distal small bowel obstruction with transition point over the ileum in the right upper pelvis immediately adjacent/abutting the known malignant stricturing of the sigmoid colon. There is wall thickening of the sigmoid colon wi...      01/30/2015 - 01/31/2015 Hospital Admission    SBO (small bowel obstruction)      03/26/2015 Treatment Plan Change    Xeloda cost-prohibitive without financial help      04/15/2015 -  Chemotherapy    5FU/Leucovorin days 1-5 every 28 days      05/13/2015 Adverse Reaction    Palmar-Plantar erythrodysesthesia      05/13/2015 Treatment Plan Change  5FU/Leucovorin held      05/28/2015 Treatment Plan Change    5FU dose reduced by 20%      07/03/2015 Imaging    CT CAP- Resolution right-sided pulmonary nodules and decrease in size of small mediastinal nodes. Response to therapy of hepatic metastasis. Similar to slight increase in suspicious nodes in the high mesorectum.      10/29/2015 Imaging    CT CAP- Further reduction in size of the hepatic metastatic tumors, compatible with interval effective therapy. 2 mm left upper lobe pulmonary nodule has been stable over the past 15 months. Likely benign, may merit surveillance.        Back pain persists.  He used muscle relaxant  based upon our previous encounter.  He notes that it made him very drowsy.  He is not taking Flexeril regularly as a result.  He notes that his pain is mildly improved or the same.  He notes that it comes and goes.  He denies any trauma.  Increased activity increases the pain.  He shoes me rash in his lower legs, on the shins.  He notes that it is pruritis and nature.  He is using hydrocortisone cream which improves the itching.  Itching is worse at HS.  He reports soles of feet dryness with cracking.  Review of Systems  Constitutional: Negative.  Negative for chills and fever.  HENT: Negative.   Eyes: Negative.   Respiratory: Negative.  Negative for cough, hemoptysis and shortness of breath.   Cardiovascular: Negative.  Negative for chest pain.  Gastrointestinal: Negative.  Negative for blood in stool, constipation, diarrhea, melena, nausea and vomiting.  Genitourinary: Negative.   Musculoskeletal: Positive for back pain.  Skin: Positive for itching and rash (shins bilaterally.).       Dry skin with cracking on soles of feet B/L  Neurological: Negative.   Endo/Heme/Allergies: Negative.   Psychiatric/Behavioral: Negative.     Past Medical History:  Diagnosis Date  . 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 COLONOSCOPY N/A 07/30/2014   Procedure: COLONOSCOPY;  Surgeon: Inda Castle, MD;  Location: [facility] END[facility]COPY;  Service: Endoscopy;  Laterality: N/A;  . ESOPHAGOGASTRODUODENOSCOPY N/A 07/30/2014   Procedure: ESOPHAGOGASTRODUODENOSCOPY (EGD);  Surgeon: Inda Castle, MD;  Location: [facility];  Service: Endoscopy;  Laterality: N/A;  . OPERATIVE ULTRASOUND N/A 08/01/2014   Procedure: OPERATIVE ULTRASOUND;  Surgeon: Gayland Curry, MD;  Location: [facility] ORS;  Service: General;  Laterality: N/A;  . PORTACATH PLACEMENT Right 08/01/14  . PORTACATH PLACEMENT N/A 08/01/2014   Procedure: INSERTION RIGHT IJ PORT-A-CATH WITH  ULTRASOUND AND FLUORO;  Surgeon: Gayland Curry, MD;  Location: [facility] ORS;  Service: General;  Laterality: N/A;    Family History  Problem Relation Age of Onset  . Cancer Mother     Deceased at 15 years old from metastatic colon cancer  . Kidney failure Father   . Heart disease Father     Deceased in 40-50's  . Colon polyps Brother   . Cancer Maternal Aunt     Social History   Social History  . Marital status: Married    Spouse name: N/A  . Number of children: N/A  . Years of education: N/A   Social History Main Topics  . Smoking status: Never Smoker  . Smokeless tobacco: Former Systems developer    Types: Chew    Quit date: 06/01/1989  . Alcohol use No  . Drug  use: No  . Sexual activity: Not Asked   Other Topics Concern  . None   Social History Narrative  . None     PHYSICAL EXAMINATION  ECOG PERFORMANCE STATUS: 1 - Symptomatic but completely ambulatory  Vitals:   03/30/16 0900  BP: 139/80  Pulse: 74  Resp: 16  Temp: 98.3 F (36.8 C)    GENERAL:alert, no distress, well nourished, well developed, comfortable, cooperative, obese, smiling and unaccompanied, in chemo-recliner SKIN: skin color, texture, turgor are normal.  Positive: erythematous maculopapular rash on anterior aspect of lower legs bilaterally, soles of feet cracking with an pen area on right sole of foot. HEAD: Normocephalic, No masses, lesions, tenderness or abnormalities EYES: normal, EOMI, Conjunctiva are pink and non-injected EARS: External ears normal OROPHARYNX:lips, buccal mucosa, and tongue normal and mucous membranes are moist  NECK: supple, no adenopathy, trachea midline LYMPH:  no palpable lymphadenopathy BREAST:not examined LUNGS: clear to auscultation and percussion HEART: regular rate & rhythm, no murmurs, no gallops, S1 normal and S2 normal ABDOMEN:abdomen soft, non-tender, obese and normal bowel sounds BACK: Back symmetric, no curvature.  EXTREMITIES:less then 2 second capillary refill, no joint  deformities, effusion, or inflammation, no skin discoloration, no cyanosis  NEURO: alert & oriented x 3 with fluent speech, no focal motor/sensory deficits, gait normal   LABORATORY DATA: CBC    Component Value Date/Time   WBC 7.2 03/30/2016 1000   RBC 4.58 03/30/2016 1000   HGB 13.9 03/30/2016 1000   HCT 40.4 03/30/2016 1000   PLT 186 03/30/2016 1000   MCV 88.2 03/30/2016 1000   MCH 30.3 03/30/2016 1000   MCHC 34.4 03/30/2016 1000   RDW 14.9 03/30/2016 1000   LYMPHSABS 2.0 03/30/2016 1000   MONOABS 0.5 03/30/2016 1000   EOSABS 0.4 03/30/2016 1000   BASOSABS 0.0 03/30/2016 1000      Chemistry      Component Value Date/Time   NA 136 03/30/2016 1000   K 3.9 03/30/2016 1000   CL 104 03/30/2016 1000   CO2 25 03/30/2016 1000   BUN 19 03/30/2016 1000   CREATININE 1.11 03/30/2016 1000      Component Value Date/Time   CALCIUM 9.1 03/30/2016 1000   ALKPHOS 82 03/30/2016 1000   AST 29 03/30/2016 1000   ALT 25 03/30/2016 1000   BILITOT 0.7 03/30/2016 1000     Lab Results  Component Value Date   CEA 6.2 (H) 03/30/2016     PENDING LABS:   RADIOGRAPHIC STUDIES:  No results found.   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  As a result, he was switched to Xeloda 2500 mg BID, 7 days on and 7 days as a maintenance treatment.  In October 2016, this treatment became cost-prohibitive and therefore, on 04/15/2015, he was transitioned to treatment with 5FU/Leucovorin in a days 1-5 every 28 day fashion.  Genetic Counseling is complete and is negative.  Oncology history is updated.  Labs today: CBC diff, CMET, CEA.  I personally reviewed and went over laboratory results with the patient.  The results are noted within this  dictation.  CEA is noted to be WNL, but up to 4.4 most recently.  I am concerned for impending failure.  Will continue to monitor and I would have a low  threshold to restage him with imaging.  If CEA is higher today, will schedule patient for restaging tests.  Ongoing back pain that is managed typically with rest and OTC/Rx medications.  Based on patient history, back pain seems to be musculoskeletal in nature.  Rash on shins bilaterally that is pruritic.  He is using OTC hydrocortisone.  Seems to be improving.  His soles of feet bilaterally are dry and cracked.  He is advised to use moisturize lotion, particularly at HS and in AM with [facility] cream/Vanicream.  He will then cover it with a cotton sock.  Labs in 4 weeks: CBC diff, CMET, CEA.  Return in 4 weeks for follow-up and ongoing treatment.  Addendum: CEA is elevated at 6.2.  As a result, orders are placed for CT CAP with contrast.   ORDERS PLACED FOR THIS ENCOUNTER: Orders Placed This Encounter  Procedures  . CT Abdomen Pelvis W Contrast  . CT Chest W Contrast    MEDICATIONS PRESCRIBED THIS ENCOUNTER: No orders of the defined types were placed in this encounter.   THERAPY PLAN:  Continue palliative treatment with 5FU/Leucovorin while monitoring for failure of therapy/progression of disease.  All questions were answered. The patient knows to call the clinic with any problems, questions or concerns. We can certainly see the patient much sooner if necessary.  Patient and plan discussed with Dr. Ancil Linsey and she is in agreement with the aforementioned.   This note is electronically signed by: Doy Mince 03/31/2016 9:16 AM

## 2016-03-30 NOTE — Patient Instructions (Signed)
[place] at [facility] Discharge Instructions  RECOMMENDATIONS MADE BY THE CONSULTANT AND ANY TEST RESULTS WILL BE SENT TO YOUR REFERRING PHYSICIAN.  You were seen today by [name] PA-C. Use Hydrocortisone cream on rash. Use moisturizer for feet. Follow up in 4 weeks.   Thank you for choosing [place] at [facility] to provide your oncology and hematology care.  To afford each patient quality time with our provider, please arrive at least 15 minutes before your scheduled appointment time.   Beginning January 23rd 2017 lab work for the [name] will be done in the  Main lab at [facility] on 1st floor. If you have a lab appointment with the [facility] please come in thru the  Main Entrance and check in at the main information desk  You need to re-schedule your appointment should you arrive 10 or more minutes late.  We strive to give you quality time with our providers, and arriving late affects you and other patients whose appointments are after yours.  Also, if you no show three or more times for appointments you may be dismissed from the clinic at the providers discretion.     Again, thank you for choosing [facility].  Our hope is that these requests will decrease the amount of time that you wait before being seen by our physicians.       _____________________________________________________________  Should you have questions after your visit to [facility], please contact our office at [phone] between the hours of 8:30 a.m. and 4:30 p.m.  Voicemails left after 4:30 p.m. will not be returned until the following business day.  For prescription refill requests, have your pharmacy contact our office.         Resources For Cancer Patients and their Caregivers ? American Cancer Society: Can assist with transportation, wigs, general needs, runs Look Good Feel Better.         803-113-1738 ? Cancer Care: Provides financial assistance, online support groups, medication/co-pay assistance.  1-800-813-HOPE (249)200-9501) ? [place] Assists [place] cancer patients and their families through emotional , educational and financial support.  705-128-9088 ? [place] DSS Where to apply for food stamps, Medicaid and utility assistance. (929) 415-0020 ? RCATS: Transportation to medical appointments. ([phone] 239-1548 ? Social Security Administration: May apply for disability if have a Stage IV cancer. 6578277579 774-533-6991 ? [employer], Disability and Transit Services: Assists with nutrition, care and transit needs. [place] Support Programs: @10RELATIVEDAYS @ > Cancer Support Group  2nd Tuesday of the month 1pm-2pm, Journey Room  > Creative Journey  3rd Tuesday of the month 1130am-1pm, Journey Room  > Look Good Feel Better  1st Wednesday of the month 10am-12 noon, Journey Room (Call [place] to register [phone])

## 2016-03-30 NOTE — Progress Notes (Signed)
Tolerated tx w/o adverse reaction.  Alert, in no distress.  VSS.  Discharged ambulatory. 

## 2016-03-30 NOTE — Patient Instructions (Signed)
[facility] Discharge Instructions for Patients Receiving Chemotherapy   Beginning January 23rd 2017 lab work for the [facility] will be done in the  Main lab at [facility] on 1st floor. If you have a lab appointment with the [facility] please come in thru the  Main Entrance and check in at the main information desk   Today you received the following chemotherapy agents:  Leucovorin and 5FU  If you develop nausea and vomiting, or diarrhea that is not controlled by your medication, call the clinic.  The clinic phone number is [phone]. Office hours are Monday-Friday 8:30am-5:00pm.  BELOW ARE SYMPTOMS THAT SHOULD BE REPORTED IMMEDIATELY:  *FEVER GREATER THAN 101.0 F  *CHILLS WITH OR WITHOUT FEVER  NAUSEA AND VOMITING THAT IS NOT CONTROLLED WITH YOUR NAUSEA MEDICATION  *UNUSUAL SHORTNESS OF BREATH  *UNUSUAL BRUISING OR BLEEDING  TENDERNESS IN MOUTH AND THROAT WITH OR WITHOUT PRESENCE OF ULCERS  *URINARY PROBLEMS  *BOWEL PROBLEMS  UNUSUAL RASH Items with * indicate a potential emergency and should be followed up as soon as possible. If you have an emergency after office hours please contact your primary care physician or go to the nearest emergency department.  Please call the clinic during office hours if you have any questions or concerns.   You may also contact the Patient Navigator at [phone] should you have any questions or need assistance in obtaining follow up care.      Resources For Cancer Patients and their Caregivers ? American Cancer Society: Can assist with transportation, wigs, general needs, runs Look Good Feel Better.        ([phone] ? Cancer Care: Provides financial assistance, online support groups, medication/co-pay assistance.  1-800-813-HOPE 430-090-4101) ? [place] Assists [place] cancer patients and their families through emotional , educational and financial support.   902-882-0235 ? [place] DSS Where to apply for food stamps, Medicaid and utility assistance. 239 735 5789 ? RCATS: Transportation to medical appointments. (754)669-4989 ? Social Security Administration: May apply for disability if have a Stage IV cancer. 934-533-6754 [phone] ? [employer], Disability and Transit Services: Assists with nutrition, care and transit needs. [phone]

## 2016-03-30 NOTE — Assessment & Plan Note (Addendum)
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  As a result, he was switched to Xeloda 2500 mg BID, 7 days on and 7 days as a maintenance treatment.  In October 2016, this treatment became cost-prohibitive and therefore, on 04/15/2015, he was transitioned to treatment with 5FU/Leucovorin in a days 1-5 every 28 day fashion.  Genetic Counseling is complete and is negative.  Oncology history is updated.  Labs today: CBC diff, CMET, CEA.  I personally reviewed and went over laboratory results with the patient.  The results are noted within this dictation.  CEA is noted to be WNL, but up to 4.4 most recently.  I am concerned for impending failure.  Will continue to monitor and I would have a low threshold to restage him with imaging.  If CEA is higher today, will schedule patient for restaging tests.  Ongoing back pain that is managed typically with rest and OTC/Rx medications.  Based on patient history, back pain seems to be musculoskeletal in nature.  Rash on shins bilaterally that is pruritic.  He is using OTC hydrocortisone.  Seems to be improving.  His soles of feet bilaterally are dry and cracked.  He is advised to use moisturize lotion, particularly at HS and in AM with [facility] cream/Vanicream.  He will then cover it with a cotton sock.  Labs in 4 weeks: CBC diff, CMET, CEA.  Return in 4 weeks for follow-up and ongoing treatment.  Addendum: CEA is elevated at 6.2.  As a result, orders are placed for CT CAP with contrast.

## 2016-03-31 ENCOUNTER — Encounter (HOSPITAL_BASED_OUTPATIENT_CLINIC_OR_DEPARTMENT_OTHER): Payer: Medicaid Other

## 2016-03-31 VITALS — BP 115/69 | HR 80 | Temp 98.1°F | Resp 18

## 2016-03-31 DIAGNOSIS — C787 Secondary malignant neoplasm of liver and intrahepatic bile duct: Secondary | ICD-10-CM | POA: Diagnosis not present

## 2016-03-31 DIAGNOSIS — C189 Malignant neoplasm of colon, unspecified: Secondary | ICD-10-CM

## 2016-03-31 DIAGNOSIS — Z5111 Encounter for antineoplastic chemotherapy: Secondary | ICD-10-CM

## 2016-03-31 LAB — CEA: CEA: 6.2 ng/mL — ABNORMAL HIGH (ref 0.0–4.7)

## 2016-03-31 MED ORDER — HEPARIN SOD (PORK) LOCK FLUSH 100 UNIT/ML IV SOLN
500.0000 [IU] | Freq: Once | INTRAVENOUS | Status: AC | PRN
  Administered 2016-03-31: 500 [IU]
  Filled 2016-03-31: qty 5

## 2016-03-31 MED ORDER — ONDANSETRON HCL 4 MG PO TABS
8.0000 mg | ORAL_TABLET | Freq: Once | ORAL | Status: AC
  Administered 2016-03-31: 8 mg via ORAL
  Filled 2016-03-31: qty 2

## 2016-03-31 MED ORDER — LEUCOVORIN CALCIUM INJECTION 100 MG
20.0000 mg/m2 | Freq: Once | INTRAMUSCULAR | Status: AC
  Administered 2016-03-31: 42 mg via INTRAVENOUS
  Filled 2016-03-31: qty 2.1

## 2016-03-31 MED ORDER — PROCHLORPERAZINE MALEATE 10 MG PO TABS
10.0000 mg | ORAL_TABLET | Freq: Once | ORAL | Status: AC
  Administered 2016-03-31: 10 mg via ORAL
  Filled 2016-03-31: qty 1

## 2016-03-31 MED ORDER — SODIUM CHLORIDE 0.9 % IJ SOLN: 10.0000 mL | INTRAMUSCULAR | Status: DC | PRN

## 2016-03-31 MED ORDER — SODIUM CHLORIDE 0.9 % IV SOLN
Freq: Once | INTRAVENOUS | Status: AC
  Administered 2016-03-31: 10:00:00 via INTRAVENOUS

## 2016-03-31 MED ORDER — SODIUM CHLORIDE 0.9 % IV SOLN: 8.0000 mg | Freq: Once | INTRAVENOUS | Status: DC

## 2016-03-31 MED ORDER — FLUOROURACIL CHEMO INJECTION 2.5 GM/50ML
340.0000 mg/m2 | Freq: Once | INTRAVENOUS | Status: AC
  Administered 2016-03-31: 700 mg via INTRAVENOUS
  Filled 2016-03-31: qty 14

## 2016-03-31 NOTE — Progress Notes (Signed)
Tolerated tx w/o adverse reaction; VSS.  Discharged ambulatory. 

## 2016-03-31 NOTE — Patient Instructions (Signed)
[facility] Discharge Instructions for Patients Receiving Chemotherapy   Beginning January 23rd 2017 lab work for the [facility] will be done in the  Main lab at [facility] on 1st floor. If you have a lab appointment with the [place] [facility] please come in thru the  Main Entrance and check in at the main information desk   Today you received the following chemotherapy agents:  Leucovorin and 5FU  If you develop nausea and vomiting, or diarrhea that is not controlled by your medication, call the clinic.  The clinic phone number is [phone]. Office hours are Monday-Friday 8:30am-5:00pm.  BELOW ARE SYMPTOMS THAT SHOULD BE REPORTED IMMEDIATELY:  *FEVER GREATER THAN 101.0 F  *CHILLS WITH OR WITHOUT FEVER  NAUSEA AND VOMITING THAT IS NOT CONTROLLED WITH YOUR NAUSEA MEDICATION  *UNUSUAL SHORTNESS OF BREATH  *UNUSUAL BRUISING OR BLEEDING  TENDERNESS IN MOUTH AND THROAT WITH OR WITHOUT PRESENCE OF ULCERS  *URINARY PROBLEMS  *BOWEL PROBLEMS  UNUSUAL RASH Items with * indicate a potential emergency and should be followed up as soon as possible. If you have an emergency after office hours please contact your primary care physician or go to the nearest emergency department.  Please call the clinic during office hours if you have any questions or concerns.   You may also contact the Patient Navigator at [phone] should you have any questions or need assistance in obtaining follow up care.      Resources For Cancer Patients and their Caregivers ? American Cancer Society: Can assist with transportation, wigs, general needs, runs Look Good Feel Better.        279-421-2199 ? Cancer Care: Provides financial assistance, online support groups, medication/co-pay assistance.  1-800-813-HOPE (301) 439-4362) ? [place] Assists [place] cancer patients and their families through emotional , educational and financial support.   [phone] ? [place] DSS Where to apply for food stamps, Medicaid and utility assistance. 725-510-8126 ? RCATS: Transportation to medical appointments. 931-729-6466 ? Social Security Administration: May apply for disability if have a Stage IV cancer. [phone] [phone] ? [employer], Disability and Transit Services: Assists with nutrition, care and transit needs. [phone]

## 2016-03-31 NOTE — Addendum Note (Signed)
Addended by: Baird [facility] on: 03/31/2016 09:16 AM   Modules accepted: Orders

## 2016-04-01 ENCOUNTER — Encounter (HOSPITAL_COMMUNITY): Payer: Medicaid Other | Attending: Hematology & Oncology

## 2016-04-01 VITALS — BP 113/71 | HR 68 | Temp 98.8°F | Resp 18 | Wt 223.2 lb

## 2016-04-01 DIAGNOSIS — C187 Malignant neoplasm of sigmoid colon: Secondary | ICD-10-CM | POA: Insufficient documentation

## 2016-04-01 DIAGNOSIS — Z5111 Encounter for antineoplastic chemotherapy: Secondary | ICD-10-CM

## 2016-04-01 DIAGNOSIS — C787 Secondary malignant neoplasm of liver and intrahepatic bile duct: Secondary | ICD-10-CM | POA: Insufficient documentation

## 2016-04-01 DIAGNOSIS — C189 Malignant neoplasm of colon, unspecified: Secondary | ICD-10-CM

## 2016-04-01 MED ORDER — SODIUM CHLORIDE 0.9 % IV SOLN
340.0000 mg/m2 | Freq: Once | INTRAVENOUS | Status: AC
  Administered 2016-04-01: 700 mg via INTRAVENOUS
  Filled 2016-04-01: qty 14

## 2016-04-01 MED ORDER — ONDANSETRON HCL 4 MG PO TABS
ORAL_TABLET | ORAL | Status: AC
  Filled 2016-04-01: qty 2

## 2016-04-01 MED ORDER — HEPARIN SOD (PORK) LOCK FLUSH 100 UNIT/ML IV SOLN
500.0000 [IU] | Freq: Once | INTRAVENOUS | Status: AC | PRN
Start: 2016-04-01 — End: 2016-04-01
  Administered 2016-04-01: 500 [IU]

## 2016-04-01 MED ORDER — PROCHLORPERAZINE MALEATE 10 MG PO TABS
ORAL_TABLET | ORAL | Status: AC
  Filled 2016-04-01: qty 1

## 2016-04-01 MED ORDER — PROCHLORPERAZINE MALEATE 10 MG PO TABS
10.0000 mg | ORAL_TABLET | Freq: Once | ORAL | Status: AC
  Administered 2016-04-01: 10 mg via ORAL

## 2016-04-01 MED ORDER — ONDANSETRON HCL 4 MG PO TABS
8.0000 mg | ORAL_TABLET | Freq: Once | ORAL | Status: AC
  Administered 2016-04-01: 8 mg via ORAL

## 2016-04-01 MED ORDER — HEPARIN SOD (PORK) LOCK FLUSH 100 UNIT/ML IV SOLN
INTRAVENOUS | Status: AC
  Filled 2016-04-01: qty 5

## 2016-04-01 MED ORDER — SODIUM CHLORIDE 0.9 % IV SOLN
Freq: Once | INTRAVENOUS | Status: AC
  Administered 2016-04-01: 11:00:00 via INTRAVENOUS

## 2016-04-01 MED ORDER — LEUCOVORIN CALCIUM INJECTION 100 MG
20.0000 mg/m2 | Freq: Once | INTRAMUSCULAR | Status: AC
  Administered 2016-04-01: 42 mg via INTRAVENOUS
  Filled 2016-04-01: qty 2.1

## 2016-04-01 NOTE — Patient Instructions (Signed)
[facility] Discharge Instructions for Patients Receiving Chemotherapy   Beginning January 23rd 2017 lab work for the [facility] will be done in the  Main lab at [facility] on 1st floor. If you have a lab appointment with the [facility] please come in thru the  Main Entrance and check in at the main information desk   Today you received the following chemotherapy agents:  Leucovorin and 5FU  If you develop nausea and vomiting, or diarrhea that is not controlled by your medication, call the clinic.  The clinic phone number is [phone]. Office hours are Monday-Friday 8:30am-5:00pm.  BELOW ARE SYMPTOMS THAT SHOULD BE REPORTED IMMEDIATELY:  *FEVER GREATER THAN 101.0 F  *CHILLS WITH OR WITHOUT FEVER  NAUSEA AND VOMITING THAT IS NOT CONTROLLED WITH YOUR NAUSEA MEDICATION  *UNUSUAL SHORTNESS OF BREATH  *UNUSUAL BRUISING OR BLEEDING  TENDERNESS IN MOUTH AND THROAT WITH OR WITHOUT PRESENCE OF ULCERS  *URINARY PROBLEMS  *BOWEL PROBLEMS  UNUSUAL RASH Items with * indicate a potential emergency and should be followed up as soon as possible. If you have an emergency after office hours please contact your primary care physician or go to the nearest emergency department.  Please call the clinic during office hours if you have any questions or concerns.   You may also contact the Patient Navigator at [phone] should you have any questions or need assistance in obtaining follow up care.      Resources For Cancer Patients and their Caregivers ? American Cancer Society: Can assist with transportation, wigs, general needs, runs Look Good Feel Better.        [phone] ? Cancer Care: Provides financial assistance, online support groups, medication/co-pay assistance.  1-800-813-HOPE 636 726 8033) ? [place] Assists [place] cancer patients and their families through emotional , educational and financial support.   [phone] ? [place] DSS Where to apply for food stamps, Medicaid and utility assistance. 5592054697 ? RCATS: Transportation to medical appointments. 4145136845 ? Social Security Administration: May apply for disability if have a Stage IV cancer. 8591470466 [phone] ? [employer], Disability and Transit Services: Assists with nutrition, care and transit needs. [phone]

## 2016-04-01 NOTE — Progress Notes (Signed)
Tolerated tx w/o adverse reaction.  Alert, in no distress.  VSS.  Discharged ambulatory. 

## 2016-04-02 ENCOUNTER — Encounter (HOSPITAL_COMMUNITY): Payer: Self-pay | Attending: Hematology & Oncology

## 2016-04-02 VITALS — BP 117/61 | HR 71 | Temp 98.0°F | Resp 18

## 2016-04-02 DIAGNOSIS — C189 Malignant neoplasm of colon, unspecified: Secondary | ICD-10-CM

## 2016-04-02 DIAGNOSIS — C787 Secondary malignant neoplasm of liver and intrahepatic bile duct: Secondary | ICD-10-CM

## 2016-04-02 DIAGNOSIS — Z5111 Encounter for antineoplastic chemotherapy: Secondary | ICD-10-CM

## 2016-04-02 MED ORDER — PROCHLORPERAZINE MALEATE 10 MG PO TABS
ORAL_TABLET | ORAL | Status: AC
  Filled 2016-04-02: qty 1

## 2016-04-02 MED ORDER — SODIUM CHLORIDE 0.9 % IV SOLN
340.0000 mg/m2 | Freq: Once | INTRAVENOUS | Status: AC
  Administered 2016-04-02: 700 mg via INTRAVENOUS
  Filled 2016-04-02: qty 14

## 2016-04-02 MED ORDER — PROCHLORPERAZINE MALEATE 10 MG PO TABS
10.0000 mg | ORAL_TABLET | Freq: Once | ORAL | Status: AC
  Administered 2016-04-02: 10 mg via ORAL

## 2016-04-02 MED ORDER — ONDANSETRON HCL 4 MG PO TABS
ORAL_TABLET | ORAL | Status: AC
  Filled 2016-04-02: qty 2

## 2016-04-02 MED ORDER — LEUCOVORIN CALCIUM INJECTION 100 MG
20.0000 mg/m2 | Freq: Once | INTRAMUSCULAR | Status: AC
  Administered 2016-04-02: 42 mg via INTRAVENOUS
  Filled 2016-04-02: qty 2.1

## 2016-04-02 MED ORDER — SODIUM CHLORIDE 0.9 % IV SOLN
Freq: Once | INTRAVENOUS | Status: AC
  Administered 2016-04-02: 11:00:00 via INTRAVENOUS

## 2016-04-02 MED ORDER — HEPARIN SOD (PORK) LOCK FLUSH 100 UNIT/ML IV SOLN
INTRAVENOUS | Status: AC
  Filled 2016-04-02: qty 5

## 2016-04-02 MED ORDER — SODIUM CHLORIDE 0.9 % IJ SOLN
10.0000 mL | INTRAMUSCULAR | Status: DC | PRN
  Administered 2016-04-02: 10 mL
  Filled 2016-04-02: qty 10

## 2016-04-02 MED ORDER — ONDANSETRON HCL 4 MG PO TABS
8.0000 mg | ORAL_TABLET | Freq: Once | ORAL | Status: AC
  Administered 2016-04-02: 8 mg via ORAL

## 2016-04-02 MED ORDER — HEPARIN SOD (PORK) LOCK FLUSH 100 UNIT/ML IV SOLN
500.0000 [IU] | Freq: Once | INTRAVENOUS | Status: AC | PRN
Start: 2016-04-02 — End: 2016-04-02
  Administered 2016-04-02: 500 [IU]

## 2016-04-02 NOTE — Progress Notes (Signed)
Tolerated chemo well. Stable and ambulatory on discharge home to self. 

## 2016-04-02 NOTE — Patient Instructions (Signed)
[facility] Discharge Instructions for Patients Receiving Chemotherapy   Beginning January 23rd 2017 lab work for the [facility] will be done in the  Main lab at [facility] on 1st floor. If you have a lab appointment with the [place] please come in thru the  Main Entrance and check in at the main information desk   Today you received the following chemotherapy agents leucovorin and 42fu.  If you develop nausea and vomiting, or diarrhea that is not controlled by your medication, call the clinic.  The clinic phone number is [phone]. Office hours are Monday-Friday 8:30am-5:00pm.  BELOW ARE SYMPTOMS THAT SHOULD BE REPORTED IMMEDIATELY:  *FEVER GREATER THAN 101.0 F  *CHILLS WITH OR WITHOUT FEVER  NAUSEA AND VOMITING THAT IS NOT CONTROLLED WITH YOUR NAUSEA MEDICATION  *UNUSUAL SHORTNESS OF BREATH  *UNUSUAL BRUISING OR BLEEDING  TENDERNESS IN MOUTH AND THROAT WITH OR WITHOUT PRESENCE OF ULCERS  *URINARY PROBLEMS  *BOWEL PROBLEMS  UNUSUAL RASH Items with * indicate a potential emergency and should be followed up as soon as possible. If you have an emergency after office hours please contact your primary care physician or go to the nearest emergency department.  Please call the clinic during office hours if you have any questions or concerns.   You may also contact the Patient Navigator at [phone] should you have any questions or need assistance in obtaining follow up care.      Resources For Cancer Patients and their Caregivers ? American Cancer Society: Can assist with transportation, wigs, general needs, runs Look Good Feel Better.        619-556-6102 ? Cancer Care: Provides financial assistance, online support groups, medication/co-pay assistance.  1-800-813-HOPE 231-021-7464) ? [place] [facility] [place] cancer patients and their families through emotional , educational and financial support.   571-394-9790 ? [place] DSS Where to apply for food stamps, Medicaid and utility assistance. [phone] ? [employer]: Transportation to medical appointments. 202-792-1109 ? Social Security Administration: May apply for disability if have a Stage IV cancer. 289-369-7452 479-074-8309 ? [employer], Disability and Transit Services: Assists with nutrition, care and transit needs. [phone]

## 2016-04-03 ENCOUNTER — Encounter (HOSPITAL_BASED_OUTPATIENT_CLINIC_OR_DEPARTMENT_OTHER): Payer: Medicaid Other

## 2016-04-03 VITALS — BP 126/69 | HR 67 | Temp 97.8°F | Resp 18 | Wt 225.4 lb

## 2016-04-03 DIAGNOSIS — Z5111 Encounter for antineoplastic chemotherapy: Secondary | ICD-10-CM

## 2016-04-03 DIAGNOSIS — C189 Malignant neoplasm of colon, unspecified: Secondary | ICD-10-CM

## 2016-04-03 DIAGNOSIS — C787 Secondary malignant neoplasm of liver and intrahepatic bile duct: Secondary | ICD-10-CM

## 2016-04-03 MED ORDER — SODIUM CHLORIDE 0.9 % IV SOLN
340.0000 mg/m2 | Freq: Once | INTRAVENOUS | Status: AC
  Administered 2016-04-03: 700 mg via INTRAVENOUS
  Filled 2016-04-03: qty 14

## 2016-04-03 MED ORDER — HEPARIN SOD (PORK) LOCK FLUSH 100 UNIT/ML IV SOLN
500.0000 [IU] | Freq: Once | INTRAVENOUS | Status: AC | PRN
  Administered 2016-04-03: 500 [IU]

## 2016-04-03 MED ORDER — PROCHLORPERAZINE MALEATE 10 MG PO TABS
10.0000 mg | ORAL_TABLET | Freq: Once | ORAL | Status: AC
  Administered 2016-04-03: 10 mg via ORAL

## 2016-04-03 MED ORDER — SODIUM CHLORIDE 0.9 % IJ SOLN
10.0000 mL | INTRAMUSCULAR | Status: DC | PRN
  Administered 2016-04-03: 10 mL
  Filled 2016-04-03: qty 10

## 2016-04-03 MED ORDER — SODIUM CHLORIDE 0.9 % IV SOLN
Freq: Once | INTRAVENOUS | Status: AC
  Administered 2016-04-03: 11:00:00 via INTRAVENOUS

## 2016-04-03 MED ORDER — ONDANSETRON HCL 4 MG PO TABS
ORAL_TABLET | ORAL | Status: AC
  Filled 2016-04-03: qty 2

## 2016-04-03 MED ORDER — HEPARIN SOD (PORK) LOCK FLUSH 100 UNIT/ML IV SOLN
INTRAVENOUS | Status: AC
  Filled 2016-04-03: qty 5

## 2016-04-03 MED ORDER — LEUCOVORIN CALCIUM INJECTION 100 MG
20.0000 mg/m2 | Freq: Once | INTRAMUSCULAR | Status: AC
  Administered 2016-04-03: 42 mg via INTRAVENOUS
  Filled 2016-04-03: qty 2.1

## 2016-04-03 MED ORDER — ONDANSETRON HCL 4 MG PO TABS
8.0000 mg | ORAL_TABLET | Freq: Once | ORAL | Status: AC
  Administered 2016-04-03: 8 mg via ORAL

## 2016-04-03 MED ORDER — PROCHLORPERAZINE MALEATE 10 MG PO TABS
ORAL_TABLET | ORAL | Status: AC
  Filled 2016-04-03: qty 1

## 2016-04-03 NOTE — Progress Notes (Signed)
Tolerated chemo well. Stable and ambulatory on discharge home to self. 

## 2016-04-03 NOTE — Patient Instructions (Signed)
[facility] Discharge Instructions for Patients Receiving Chemotherapy   Beginning January 23rd 2017 lab work for the [facility] will be done in the  Main lab at [facility] on 1st floor. If you have a lab appointment with the [place] please come in thru the  Main Entrance and check in at the main information desk   Today you received the following chemotherapy agents leucovorin and 50fu.  If you develop nausea and vomiting, or diarrhea that is not controlled by your medication, call the clinic.  The clinic phone number is [phone]. Office hours are Monday-Friday 8:30am-5:00pm.  BELOW ARE SYMPTOMS THAT SHOULD BE REPORTED IMMEDIATELY:  *FEVER GREATER THAN 101.0 F  *CHILLS WITH OR WITHOUT FEVER  NAUSEA AND VOMITING THAT IS NOT CONTROLLED WITH YOUR NAUSEA MEDICATION  *UNUSUAL SHORTNESS OF BREATH  *UNUSUAL BRUISING OR BLEEDING  TENDERNESS IN MOUTH AND THROAT WITH OR WITHOUT PRESENCE OF ULCERS  *URINARY PROBLEMS  *BOWEL PROBLEMS  UNUSUAL RASH Items with * indicate a potential emergency and should be followed up as soon as possible. If you have an emergency after office hours please contact your primary care physician or go to the nearest emergency department.  Please call the clinic during office hours if you have any questions or concerns.   You may also contact the Patient Navigator at [phone] should you have any questions or need assistance in obtaining follow up care.      Resources For Cancer Patients and their Caregivers ? American Cancer Society: Can assist with transportation, wigs, general needs, runs Look Good Feel Better.        ([phone] ? Cancer Care: Provides financial assistance, online support groups, medication/co-pay assistance.  1-800-813-HOPE 206-080-3188) ? [place] [facility] [place] cancer patients and their families through emotional , educational and financial support.   703-844-6779 ? [place] DSS Where to apply for food stamps, Medicaid and utility assistance. 228-586-0522 ? [employer]: Transportation to medical appointments. 438-497-2251 ? Social Security Administration: May apply for disability if have a Stage IV cancer. [phone] 701-571-0600 ? [employer], Disability and Transit Services: Assists with nutrition, care and transit needs. [phone]

## 2016-04-14 ENCOUNTER — Ambulatory Visit (HOSPITAL_COMMUNITY)
Admission: RE | Admit: 2016-04-14 | Discharge: 2016-04-14 | Disposition: A | Discharge status: Home / Self Care | Payer: Medicaid Other | Source: Ambulatory Visit | Attending: Oncology | Admitting: Oncology

## 2016-04-14 DIAGNOSIS — N281 Cyst of kidney, acquired: Secondary | ICD-10-CM | POA: Insufficient documentation

## 2016-04-14 DIAGNOSIS — I700 Atherosclerosis of aorta: Secondary | ICD-10-CM | POA: Diagnosis not present

## 2016-04-14 DIAGNOSIS — C787 Secondary malignant neoplasm of liver and intrahepatic bile duct: Secondary | ICD-10-CM | POA: Insufficient documentation

## 2016-04-14 DIAGNOSIS — I2510 Atherosclerotic heart disease of native coronary artery without angina pectoris: Secondary | ICD-10-CM | POA: Diagnosis not present

## 2016-04-14 DIAGNOSIS — M5136 Other intervertebral disc degeneration, lumbar region: Secondary | ICD-10-CM | POA: Diagnosis not present

## 2016-04-14 DIAGNOSIS — M47816 Spondylosis without myelopathy or radiculopathy, lumbar region: Secondary | ICD-10-CM | POA: Insufficient documentation

## 2016-04-14 DIAGNOSIS — R918 Other nonspecific abnormal finding of lung field: Secondary | ICD-10-CM | POA: Diagnosis not present

## 2016-04-14 DIAGNOSIS — C189 Malignant neoplasm of colon, unspecified: Secondary | ICD-10-CM | POA: Diagnosis not present

## 2016-04-14 MED ORDER — IOPAMIDOL (ISOVUE-300) INJECTION 61%
100.0000 mL | Freq: Once | INTRAVENOUS | Status: AC | PRN
  Administered 2016-04-14: 100 mL via INTRAVENOUS

## 2016-04-27 ENCOUNTER — Other Ambulatory Visit (HOSPITAL_COMMUNITY): Payer: Self-pay | Admitting: Pharmacist

## 2016-04-27 ENCOUNTER — Encounter (HOSPITAL_COMMUNITY): Payer: Medicaid Other | Admitting: Hematology & Oncology

## 2016-04-27 ENCOUNTER — Telehealth (HOSPITAL_COMMUNITY): Payer: Self-pay | Admitting: Emergency Medicine

## 2016-04-27 ENCOUNTER — Other Ambulatory Visit (HOSPITAL_COMMUNITY): Payer: Self-pay | Admitting: Hematology & Oncology

## 2016-04-27 ENCOUNTER — Ambulatory Visit (HOSPITAL_COMMUNITY): Payer: Medicaid Other

## 2016-04-27 ENCOUNTER — Encounter (HOSPITAL_COMMUNITY): Payer: Self-pay | Admitting: Emergency Medicine

## 2016-04-27 MED ORDER — LOPERAMIDE HCL 2 MG PO TABS: ORAL_TABLET | ORAL | 1 refills | Status: DC

## 2016-04-27 MED ORDER — DEXAMETHASONE 4 MG PO TABS: 8.0000 mg | ORAL_TABLET | Freq: Every day | ORAL | 5 refills | Status: DC

## 2016-04-27 NOTE — Telephone Encounter (Signed)
Called pt to let him know that there was progression on his scans.  Pt states that he talked with Dr Whitney Muse last night.  I will call him to do teaching over the phone on [place] and I will give him a extensive teaching packet when the patient comes in to start his first treatment.  Pt verbalized understanding.

## 2016-04-27 NOTE — Patient Instructions (Signed)
[facility]   CHEMOTHERAPY INSTRUCTIONS  You have Stage 4 Colorectal Cancer.  Your  scans showed progression, so we changed your chemotherapy to irinotecan and vectibix every 2 weeks.  2 weeks is 1 cycle.  You will continue to take these drugs until you have progression or you can no longer tolerate these drugs.  This treatment is with palliative intent, which means you are treatable but not curable.   You will see the doctor regularly throughout treatment.  We monitor your lab work prior to every treatment.  The doctor monitors your response to treatment by the way you are feeling, your blood work, and scans periodically.  You will receive premedication prior to receiving chemotherapy each time.  These include: Premeds: Aloxi - high powered nausea/vomiting prevention medication used for chemotherapy patients.  Dexamethasone - steroid - given to reduce the risk of you having an allergic type reaction to the chemotherapy. Dex can cause you to feel energized, nervous/anxious/jittery, make you have trouble sleeping, and/or make you feel hot/flushed in the face/neck and/or look pink/red in the face/neck. These side effects will pass as the Dex wears off. (takes 20 minutes to infuse)   POTENTIAL SIDE EFFECTS OF TREATMENT:  Irinotecan Hydrochloride (Generic Name) Other Names: Camptosar, camptothecin-11, CPT-11  About this drug Irinotecan hydrochloride is used to treat cancer. This drug is given in the vein (IV). It takes 1.5 hours to infuse)  Possible side effects (more common) . Loose bowel movements (diarrhea) that may last for a few days . Bone marrow depression. This is a decrease in the number of white blood cells, red blood cells, and platelets. This may raise your risk of infection, make you tired and weak (fatigue), and raise your risk of bleeding. . Nausea and throwing up (vomiting). These symptoms may happen within a few hours or many hours after your  treatment and may last up to 24 hours. Medicines are available to stop or lessen these side effects. . Hair loss: Most often hair loss is temporary; your hair should grow back when treatment is done.  Possible side effects (less common) . Skin and tissue irritation. You may have redness, pain, warmth, or swelling at the IV site. . Weakness . Trouble Breathing . Decreased Appetite (decreased hunger)  Treating side effects . Drink 6-8 cups of fluids each day unless your doctor has told you to limit your fluid intake due to some other health problem. A cup is 8 ounces of fluid. If you throw up or have loose bowel movements, you should drink more fluids so that you do not become dehydrated (lack water in the body from losing too much fluid). . Ask your doctor or nurse about medicine that is available to help stop or lessen the loose bowel movements. . Talk with your nurse about getting a wig before you lose your hair. Also, call the [place] at [phone]-ACS-2345 to find out information about the "Look Good, Feel Better" program close to where you live. It is a free program where women getting chemotherapy can learn about wigs, turbans and scarves as well as makeup techniques and skin and nail care. . Use effective methods of birth control during your cancer treatment. Talk with your doctor or nurse about options for birth control. . Vaginal lubricants can be used to lessen vaginal dryness, itching, and pain during sexual relations.  Food and drug interactions There are no known interactions of irinotecan hydrochloride with food. This drug may  interact with other medicines. Tell your doctor and pharmacist about all the medicines and dietary supplements (vitamins, minerals, herbs and others) that you are taking at this time. The safety and use of dietary supplements and alternative diets are often not known. Using these might affect your cancer or interfere with your treatment. Until more is  known, you should not use dietary supplements or alternative diets without your cancer doctor's help.  When to call the doctor Call your doctor or nurse right away if you have any of these symptoms: . Loose bowel movements (diarrhea) more than 5 or 6 times a day or loose bowel movements with weakness or feeling lightheaded . Temperature of 100.4 F (38 C) or above . Chills . Trouble breathing or feeling short of breath . Easy bruising or bleeding . Nausea that stops you from eating or drinking . Throwing up more than three times in one day . Redness, pain, warmth, or swelling at the IV site . Feeling dizzy, lightheaded, or if you pass out  Call your doctor or nurse as soon as possible if you have any of these symptoms: . Nausea that is not relieved by prescribed medicines . Extreme tiredness that interferes with normal activities  Sexual problems and reproduction concerns . Infertility warning: Sexual problems and reproduction concerns may happen. In both men and women, this drug may affect your ability to have children. This cannot be determined before your treatment. Talk with your doctor or nurse if you plan to have children. Ask for information on sperm or egg banking. . In men, this drug may interfere with your ability to make sperm, but it should not change your ability to have sexual relations. . In women, menstrual bleeding may become irregular or stop while you are getting this drug. Do not assume that you cannot become pregnant if you do not have a menstrual period. . Women may go through signs of menopause (change of life) like vaginal dryness or itching. Vaginal lubricants can be used to lessen vaginal dryness, itching, and pain during sexual relations. . Genetic counseling is available for you to talk about the effects of this drug therapy on future pregnancies. Also, a genetic counselor can look at the possible risk of problems in the unborn baby due to this medicine if an  exposure happens during pregnancy. . Pregnancy warning: This drug may have harmful effects on the unborn child, so effective methods of birth control should be used during your cancer treatment. . Breast feeding warning: Women should not breast feed during treatment because this drug could enter the breast milk and badly harm a breast feeding baby.   Panitumumab Other names: Vectibix  About This Drug Panitumumab is used to treat cancer. It is given in the vein (IV). This drug takes 1 hour to infuse.   Possible Side Effects (More Common) . Rash: a rash that looks like acne may happen on your face and upper back when taking this medicine. Your doctor can give you medicine to help treat this. . Darkening of the skin or nails . Loose bowel movements (diarrhea) that may last for a few days . Sensitivity to light (photosensitivity). Photosensitivity means that you may become more sensitive to the light from the sun, sun lamps, and tanning beds. Your eyes may water more, mostly in bright light. . Feeling tired  Possible Side Effects (Less Common) . Eye irritation. You may have watery eyes. Your eyes and eye lids may become red and painful. . Constipation (  not able to move bowels). Ask your nurse or doctor about medicines that are available to help stop or lessen constipation. If you are not able to move your bowels, check with your doctor or nurse before you use any enemas, laxatives, or suppositories. . Changes in lung tissue may happen. These changes may not last forever, and your lung tissue may go back to normal. Sometimes these changes may not be seen for many years. You may get a cough or have trouble catching your breath.  Infusion Reactions While you are getting this drug in your vein (IV), you may have a reaction. Your nurse will check you closely for these signs: fever or shaking chills, flushing, facial swelling, feeling dizzy, headache, trouble breathing, rash, itching, chest  tightness, or chest pain. These reactions may happen for the first 24 hours after your infusion. Call 911 for emergency care.  Treating Side Effects . Drink 6-8 cups of fluids each day unless your doctor has told you to limit your fluid intake due to some other health problem. A cup is 8 ounces of fluid. If you throw up or have loose bowel movements, you should drink more fluids so that you do not become dehydrated (lack of water in the body from losing too much fluid). . Wear dark sunglasses and use sunscreen with SPF 30 or higher when you are outdoors even for a short time. Cover up when you are our in the sun. Wear wide-brimmed hats, long-sleeved shirts, and pants. Keep your neck, chest, and back covered. . Ask your doctor or nurse about medicine to help stop or lessen nausea, throwing up, abdominal pain, and loose bowel movements. . If you have a rash, redness, itchiness, or skin that is dry or cracked, keep the area clean and dry. Do not put anything on it unless your doctor or nurse says you may. . Cover up when you are out in the sun. Wear wide-brimmed hats, long-sleeved shirts, and long pants. Keep your neck, chest, and back covered. Use sunscreen with SPF of 30 or higher when you are out in the sun even for a short time. Avoid sun lamps, tanning booths, and tanning beds. . Be careful when standing up from a lying or sitting position. You may feel dizzy.  Food and Drug Interactions There are no known interactions of panitumumab with food. This drug may interact with other medicines. Tell your doctor and pharmacist about all the medicines and dietary supplements (vitamins, minerals, herbs and others) that you are taking at this time. The safety and use of dietary supplements and alternative diets are often not known. Using these might affect your cancer or interfere with your treatment. Until more is known, you should not use dietary supplements or alternative diets without your cancer doctor's  help.  When to Call the Doctor Call your doctor or nurse right away if you have any of these symptoms: . Fever of 100.5 F (38 C) or above . Chills . Easy bruising or bleeding . Wheezing or trouble breathing . Rash or itching . Feeling dizzy or lightheaded . Feeling that your heart is beating in a fast or not normal way (palpitations) . Loose bowel movements (diarrhea) more than 4 times a day or diarrhea with weakness or feeling lightheaded . Blurred vision or other changes in eyesight . Pain when passing urine; blood in urine . Pain in your lower back or side . Feeling confused or agitated . Nausea that stops you from eating or drinking .  Throwing up more than 3 times a day . Chest pain or symptoms of a heart attack. Most heart attacks involve pain in the center of the chest that lasts more than a few minutes. The pain may go away and come back. It can feel like pressure, squeezing, fullness, or pain. Sometimes pain is felt in one or both arms, the back, neck, jaw, or stomach. If any of these symptoms last 2 minutes, call 911. [name] Symptoms of a stroke such as sudden numbness or weakness of your face, arm, or leg, mostly on one side of your body; sudden confusion, trouble speaking or understanding; sudden trouble seeing in one or both eyes; sudden trouble walking, feeling dizzy, loss of balance or coordination; or sudden, bad headache with no known cause. If you have any of these symptoms for 2 minutes, call 911. . Signs of liver problems: dark urine, pale bowel movements, bad stomach pain, feeling very tired and weak, unusual itching, or yellowing of the eyes or skin,  Call your doctor or nurse as soon as possible if any of these symptoms happen: . Change in hearing, ringing in the ears . Decreased urine . Unusual thirst or passing urine often . Pain in your mouth or throat that makes it hard to eat or drink . Nausea that is not relieved by prescribed medicines . Rash that is not relieved  by prescribed medicines . Heavy menstrual period that lasts longer than normal . Numbness, tingling, decreased feeling or weakness in fingers, toes, arms, or legs . Trouble walking or changes in the way you walk, feeling clumsy when buttoning clothes, opening jars, or other routine hand motions . Swelling of legs, ankles, or feet . Weight gain of 5 pounds in one week (fluid retention) . Lasting loss of appetite or rapid weight loss of five pounds in a week . Fatigue that interferes with your daily activities . Headache that does not go away . Painful, red, or swollen areas on your hands or feet. . No bowel movement for 3 days or you feel uncomfortable . Extreme weakness that interferes with normal activities  Sexual Problems and Reproduction Concerns . Infertility warning: Sexual problems and reproduction concerns may happen. In both men and women, this drug may affect your ability to have children. This cannot be determined before your treatment. Talk with your doctor or nurse if you plan to have children. Ask for information on sperm or egg banking. . In men, this drug may interfere with your ability to make sperm, but it should not change your ability to have sexual relations. . In women, menstrual bleeding may become irregular or stop while you are getting this drug. Do not assume that you cannot become pregnant if you do not have a menstrual period.   EDUCATIONAL MATERIALS GIVEN AND REVIEWED: Information on irinotecan and vectibix    SELF CARE ACTIVITIES WHILE ON CHEMOTHERAPY: Hydration Increase your fluid intake 48 hours prior to treatment and drink at least 8 to 12 cups (64 ounces) of water/decaff beverages per day after treatment. You can still have your cup of coffee or soda but these beverages do not count as part of your 8 to 12 cups that you need to drink daily. No alcohol intake.  Medications Continue taking your normal prescription medication as prescribed.  If you start any  new herbal or new supplements please let us know first to make sure it is safe.  Mouth Care Have teeth cleaned professionally before starting treatment. Keep dentures  and partial plates clean. Use soft toothbrush and do not use mouthwashes that contain alcohol. Biotene is a good mouthwash that is available at most pharmacies or may be ordered by calling [phone]. Use warm salt water gargles (1 teaspoon salt per 1 quart warm water) before and after meals and at bedtime. Or you may rinse with 2 tablespoons of three-percent hydrogen peroxide mixed in eight ounces of water. If you are still having problems with your mouth or sores in your mouth please call the clinic. If you need dental work, please let Dr. Whitney Muse know before you go for your appointment so that we can coordinate the best possible time for you in regards to your chemo regimen. You need to also let your dentist know that you are actively taking chemo. We may need to do labs prior to your dental appointment.   Skin Care Always use sunscreen that has not expired and with SPF (Sun Protection Factor) of 50 or higher. Wear hats to protect your head from the sun. Remember to use sunscreen on your hands, ears, face, & feet.  Use good moisturizing lotions such as udder cream, eucerin, or even Vaseline. Some chemotherapies can cause dry skin, color changes in your skin and nails.    . Avoid long, hot showers or baths. . Use gentle, fragrance-free soaps and laundry detergent. . Use moisturizers, preferably creams or ointments rather than lotions because the thicker consistency is better at preventing skin dehydration. Apply the cream or ointment within 15 minutes of showering. Reapply moisturizer at night, and moisturize your hands every time after you wash them.  Hair Loss (if your doctor says your hair will fall out)  . If your doctor says that your hair is likely to fall out, decide before you begin chemo whether you want to wear a wig.  You may want to shop before treatment to match your hair color. . Hats, turbans, and scarves can also camouflage hair loss, although some people prefer to leave their heads uncovered. If you go bare-headed outdoors, be sure to use sunscreen on your scalp. . Cut your hair short. It eases the inconvenience of shedding lots of hair, but it also can reduce the emotional impact of watching your hair fall out. . Don't perm or color your hair during chemotherapy. Those chemical treatments are already damaging to hair and can enhance hair loss. Once your chemo treatments are done and your hair has grown back, it's OK to resume dyeing or perming hair. With chemotherapy, hair loss is almost always temporary. But when it grows back, it may be a different color or texture. In older adults who still had hair color before chemotherapy, the new growth may be completely gray.  Often, new hair is very fine and soft.  Infection Prevention Please wash your hands for at least 30 seconds using warm soapy water. Handwashing is the #1 way to prevent the spread of germs. Stay away from sick people or people who are getting over a cold. If you develop respiratory systems such as green/yellow mucus production or productive cough or persistent cough let us know and we will see if you need an antibiotic. It is a good idea to keep a pair of gloves on when going into grocery stores/[employer] to decrease your risk of coming into contact with germs on the carts, etc. Carry alcohol hand gel with you at all times and use it frequently if out in public. If your temperature reaches 100.5 or higher please  call the clinic and let us know.  If it is after hours or on the weekend please go to the ER if your temperature is over 100.5.  Please have your own personal thermometer at home to use.    Sex and bodily fluids If you are going to have sex, a condom must be used to protect the person that isn't taking chemotherapy. Chemo can decrease your  libido (sex drive). For a few days after chemotherapy, chemotherapy can be excreted through your bodily fluids.  When using the toilet please close the lid and flush the toilet twice.  Do this for a few day after you have had chemotherapy.     Effects of chemotherapy on your sex life Some changes are simple and won't last long. They won't affect your sex life permanently. Sometimes you may feel: . too tired . not strong enough to be very active . sick or sore  . not in the mood . anxious or low Your anxiety might not seem related to sex. For example, you may be worried about the cancer and how your treatment is going. Or you may be worried about money, or about how you family are coping with your illness. These things can cause stress, which can affect your interest in sex. It's important to talk to your partner about how you feel. Remember - the changes to your sex life don't usually last long. There's usually no medical reason to stop having sex during chemo. The drugs won't have any long term physical effects on your performance or enjoyment of sex. Cancer can't be passed on to your partner during sex  Contraception It's important to use reliable contraception during treatment. Avoid getting pregnant while you or your partner are having chemotherapy. This is because the drugs may harm the baby. Sometimes chemotherapy drugs can leave a man or woman infertile.  This means you would not be able to have children in the future. You might want to talk to someone about permanent infertility. It can be very difficult to learn that you may no longer be able to have children. Some people find counselling helpful. There might be ways to preserve your fertility, although this is easier for men than for women. You may want to speak to a fertility expert. You can talk about sperm banking or harvesting your eggs. You can also ask about other fertility options, such as donor eggs. If you have or have had  breast cancer, your doctor might advise you not to take the contraceptive pill. This is because the hormones in it might affect the cancer.  It is not known for sure whether or not chemotherapy drugs can be passed on through semen or secretions from the vagina. Because of this some doctors advise people to use a barrier method if you have sex during treatment. This applies to vaginal, anal or oral sex. Generally, doctors advise a barrier method only for the time you are actually having the treatment and for about a week after your treatment. Advice like this can be worrying, but this does not mean that you have to avoid being intimate with your partner. You can still have close contact with your partner and continue to enjoy sex.  Animals If you have cats or birds we just ask that you not change the litter or change the cage.  Please have someone else do this for you while you are on chemotherapy.   Food Safety During and After Cancer Treatment Food safety  is important for people both during and after cancer treatment. Cancer and cancer treatments, such as chemotherapy, radiation therapy, and stem cell/bone marrow transplantation, often weaken the immune system. This makes it harder for your body to protect itself from foodborne illness, also called food poisoning. Foodborne illness is caused by eating food that contains harmful bacteria, parasites, or viruses.  Foods to avoid Some foods have a higher risk of becoming tainted with bacteria. These include: [name] Unwashed fresh fruit and vegetables, especially leafy vegetables that can hide dirt and other contaminants . Raw sprouts, such as alfalfa sprouts . Raw or undercooked beef, especially ground beef, or other raw or undercooked meat and poultry . Fatty, fried, or spicy foods immediately before or after treatment.  These can sit heavy on your stomach and make you feel nauseous. . Raw or undercooked shellfish, such as oysters. . Sushi and sashimi,  which often contain raw fish.  . Unpasteurized beverages, such as unpasteurized fruit juices, raw milk, raw yogurt, or cider . Undercooked eggs, such as soft boiled, over easy, and poached; raw, unpasteurized eggs; or foods made with raw egg, such as homemade raw cookie dough and homemade mayonnaise Simple steps for food safety Shop smart. . Do not buy food stored or displayed in an unclean area. . Do not buy bruised or damaged fruits or vegetables. . Do not buy cans that have cracks, dents, or bulges. . Pick up foods that can spoil at the end of your shopping trip and store them in a cooler on the way home. Prepare and clean up foods carefully. . Rinse all fresh fruits and vegetables under running water, and dry them with a clean towel or paper towel. . Clean the top of cans before opening them. . After preparing food, wash your hands for 20 seconds with hot water and soap. Pay special attention to areas between fingers and under nails. . Clean your utensils and dishes with hot water and soap. [name] Disinfect your kitchen and cutting boards using 1 teaspoon of liquid, unscented bleach mixed into 1 quart of water.   Dispose of old food. . Eat canned and packaged food before its expiration date (the "use by" or "best before" date). . Consume refrigerated leftovers within 3 to 4 days. After that time, throw out the food. Even if the food does not smell or look spoiled, it still may be unsafe. Some bacteria, such as Listeria, can grow even on foods stored in the refrigerator if they are kept for too long. Take precautions when eating out. . At restaurants, avoid buffets and salad bars where food sits out for a long time and comes in contact with many people. Food can become contaminated when someone with a virus, often a norovirus, or another "bug" handles it. . Put any leftover food in a "to-go" container yourself, rather than having the server do it. And, refrigerate leftovers as soon as you get  home. . Choose restaurants that are clean and that are willing to prepare your food as you order it cooked.    MEDICATIONS:  Dexamethasone 4mg  tablet.  Take 2 tablets (8 mg total) by mouth daily. Start the day after chemo for 2 days.  Take with food.  Zofran/Ondansetron 8mg  tablet. Take 1 tablet every 8 hours as needed for nausea/vomiting. (#1 nausea med to take, this can constipate)  Compazine/Prochlorperazine 10mg  tablet. Take 1 tablet every 6 hours as needed for nausea/vomiting. (#2 nausea med to take, this can make you sleepy)  EMLA cream. Apply a quarter size amount to port site 1 hour prior to chemo. Do not rub in. Cover with plastic wrap.  Imodium AD 2mg  capsule. Take 2 capsules after the 1st loose stool and then 1 capsule every 2 hours until you go a total of 12 hours without having a loose stool. Call the [facility] if loose stools continue. If diarrhea occurs @ bedtime, take 2 capsules @ bedtime. Then take 2 capsules every 4 hours until morning. Call [facility].    Over-the-Counter Meds:  Miralax 1 capful in 8 oz of fluid daily. May increase to two times a day if needed. This is a stool softener. If this doesn't work proceed you can add:  Senokot S-start with 1 tablet two times a day and increase to 4 tablets two times a day if needed. (total of 8 tablets in a 24 hour period). This is a stimulant laxative.   Call us if this does not help your bowels move.   Imodium  2mg  capsule. Take 2 capsules after the 1st loose stool and then 1 capsule every 2 hours until you go a total of 12 hours without having a loose stool. Call the [facility] if loose stools continue. If diarrhea occurs @ bedtime, take 2 capsules @ bedtime. Then take 2 capsules every 4 hours until morning. Call [facility].       Diarrhea Sheet  If you are having loose stools/diarrhea, please purchase Imodium and begin taking as outlined:  At the first sign of poorly formed or loose stools you should  begin taking Imodium(loperamide) 2 mg capsules.  Take two caplets (4mg ) followed by one caplet (2mg ) every 2 hours until you have had no diarrhea for 12 hours.  During the night take two caplets (4mg ) at bedtime and continue every 4 hours during the night until the morning.  Stop taking Imodium only after there is no sign of diarrhea for 12 hours.    Always call the [facility] if you are having loose stools/diarrhea that you can't get under control.  Loose stools/disrrhea leads to dehydration (loss of water) in your body.  We have other options of trying to get the loose stools/diarrhea to stopped but you must let us know!       Constipation Sheet *Miralax in 8 oz of fluid daily.  May increase to two times a day if needed.  This is a stool softener.  If this not enough to keep your bowel regular:  You can add:  *Senokot S, start with one tablet twice a day and can increase to 4 tablets twice a day if needed.  This is a stimulant laxative.   Sometimes when you take pain medication you need BOTH a medicine to keep your stool soft and a medicine to help your bowel push it out!  Please call if the above does not work for you.   Do not go more than 2 days without a bowel movement.  It is very important that you do not become constipated.  It will make you feel sick to your stomach (nausea) and can cause abdominal pain and vomiting.       Nausea Sheet  Zofran/Ondansetron 8mg  tablet. Take 1 tablet every 8 hours as needed for nausea/vomiting. (#1 nausea med to take, this can constipate)  Compazine/Prochlorperazine 10mg  tablet. Take 1 tablet every 6 hours as needed for nausea/vomiting. (#2 nausea med to take, this can make you sleepy)  You can take these medications  together or separately.  We would first like for you to try the Ondansetron by itself and then take the Prochloperizine if needed. But you are allowed to take both medications at the same time if your nausea is that severe.  If  you are having persistent nausea (nausea that does not stop) please take these medications on a staggered schedule so that the nausea medication stays in your body.  Please call the [facility] and let us know the amount of nausea that you are experiencing.  If you begin to vomit, you need to call the [facility] and if it is the weekend and you have vomited more than one time and cant get it to stop-go to the Emergency Room.  Persistent nausea/vomiting can lead to dehydration (loss of fluid in your body) and will make you feel terrible.   Ice chips, sips of clear liquids, foods that are @ room temperature, crackers, and toast tend to be better tolerated.      SYMPTOMS TO REPORT AS SOON AS POSSIBLE AFTER TREATMENT:  FEVER GREATER THAN 100.5 F  CHILLS WITH OR WITHOUT FEVER  NAUSEA AND VOMITING THAT IS NOT CONTROLLED WITH YOUR NAUSEA MEDICATION  UNUSUAL SHORTNESS OF BREATH  UNUSUAL BRUISING OR BLEEDING  TENDERNESS IN MOUTH AND THROAT WITH OR WITHOUT PRESENCE OF ULCERS  URINARY PROBLEMS  BOWEL PROBLEMS  UNUSUAL RASH    Wear comfortable clothing and clothing appropriate for easy access to any Portacath or PICC line. Let us know if there is anything that we can do to make your therapy better!      What to do if you need assistance after hours or on the weekends: CALL [phone].  HOLD on the line, do not hang up.  You will hear multiple messages but at the end you will be connected with a nurse triage line.  They will contact Dr Whitney Muse if necessary.  Most of the time they will be able to assist you.   Do not call the hospital operator.  Dr Whitney Muse will not answer phone calls received by them.      I have been informed and understand all of the instructions given to me and have received a copy. I have been instructed to call the clinic [phone] or my family physician as soon as possible for continued medical care, if indicated. I do not have any more questions at this  time but understand that I may call the [facility] or the Patient Navigator at [phone] during office hours should I have questions or need assistance in obtaining follow-up care.

## 2016-04-27 NOTE — Progress Notes (Signed)
Chemotherapy teaching pulled together.  Labs entered.  Medications escribed.  Chemotherapy and doctors appts made.

## 2016-04-27 NOTE — Progress Notes (Signed)
START ON PATHWAY REGIMEN - Colorectal  COS77: Irinotecan + Panitumumab q14 Days   A cycle is every 14 days:     Panitumumab (Vectibix(R)) 6 mg/kg in 133m NS IV over 60 minutes through a 0.2 micron filter.  If first dose is tolerated, may infuse subsequent doses over 30-60 minutes. Flush line with NS before and after infusion. For doses over 1000 mg, infuse  over 90 minutes. Max final conc of 10 mg/mL.   Monitor for infusion reactions and dermatologic toxicity. Dose Mod: None     Irinotecan (Camptosar(R)) 180 mg/m2 in 500 mL D5W IV over 90 minutes day 1 Dose Mod: None  **Always confirm dose/schedule in your pharmacy ordering system**    Patient Characteristics: Metastatic Colorectal, Third Line, KRAS/NRAS Wild-Type, BRAF Wild-Type/Unknown, No Prior Anti-EGFR Therapy AJCC T Stage: X AJCC N Stage: X AJCC Stage Grouping: IVB AJCC M Stage: X Current evidence of distant metastases? Yes BRAF Mutation Status: Wild Type (no mutation) KRAS/NRAS Mutation Status: Wild Type (no mutation) Line of therapy: Third Line Would you be surprised if this patient died  in the next year? I would be surprised if this patient died in the next year  Intent of Therapy: Non-Curative / Palliative Intent, Discussed with Patient

## 2016-04-28 ENCOUNTER — Ambulatory Visit (HOSPITAL_COMMUNITY): Payer: Medicaid Other

## 2016-04-29 ENCOUNTER — Encounter (HOSPITAL_COMMUNITY): Payer: Medicaid Other

## 2016-04-29 ENCOUNTER — Ambulatory Visit (HOSPITAL_COMMUNITY): Payer: Medicaid Other

## 2016-04-29 NOTE — Progress Notes (Signed)
Consent signed for irinotecan and vectibix.

## 2016-04-30 ENCOUNTER — Ambulatory Visit (HOSPITAL_COMMUNITY): Payer: Medicaid Other

## 2016-05-01 ENCOUNTER — Ambulatory Visit (HOSPITAL_COMMUNITY): Payer: Medicaid Other

## 2016-05-04 ENCOUNTER — Encounter (HOSPITAL_BASED_OUTPATIENT_CLINIC_OR_DEPARTMENT_OTHER): Payer: Medicaid Other | Admitting: Oncology

## 2016-05-04 ENCOUNTER — Encounter (HOSPITAL_COMMUNITY): Payer: Self-pay | Admitting: Oncology

## 2016-05-04 ENCOUNTER — Ambulatory Visit (HOSPITAL_COMMUNITY): Payer: Self-pay

## 2016-05-04 ENCOUNTER — Encounter (HOSPITAL_COMMUNITY): Payer: Medicaid Other | Attending: Hematology & Oncology

## 2016-05-04 VITALS — BP 142/78 | HR 66 | Temp 97.9°F | Resp 16

## 2016-05-04 VITALS — BP 139/79 | HR 71 | Temp 97.7°F | Resp 18 | Wt 219.8 lb

## 2016-05-04 DIAGNOSIS — Z5111 Encounter for antineoplastic chemotherapy: Secondary | ICD-10-CM

## 2016-05-04 DIAGNOSIS — C7801 Secondary malignant neoplasm of right lung: Secondary | ICD-10-CM | POA: Diagnosis not present

## 2016-05-04 DIAGNOSIS — C187 Malignant neoplasm of sigmoid colon: Secondary | ICD-10-CM

## 2016-05-04 DIAGNOSIS — R197 Diarrhea, unspecified: Secondary | ICD-10-CM | POA: Diagnosis not present

## 2016-05-04 DIAGNOSIS — M545 Low back pain, unspecified: Secondary | ICD-10-CM

## 2016-05-04 DIAGNOSIS — C7802 Secondary malignant neoplasm of left lung: Secondary | ICD-10-CM

## 2016-05-04 DIAGNOSIS — C787 Secondary malignant neoplasm of liver and intrahepatic bile duct: Secondary | ICD-10-CM | POA: Insufficient documentation

## 2016-05-04 DIAGNOSIS — C7800 Secondary malignant neoplasm of unspecified lung: Secondary | ICD-10-CM

## 2016-05-04 DIAGNOSIS — Z5112 Encounter for antineoplastic immunotherapy: Secondary | ICD-10-CM | POA: Diagnosis present

## 2016-05-04 DIAGNOSIS — C189 Malignant neoplasm of colon, unspecified: Secondary | ICD-10-CM | POA: Insufficient documentation

## 2016-05-04 LAB — CBC WITH DIFFERENTIAL/PLATELET
Basophils Absolute: 0 10*3/uL (ref 0.0–0.1)
Basophils Relative: 1 %
EOS PCT: 5 %
Eosinophils Absolute: 0.3 10*3/uL (ref 0.0–0.7)
HCT: 40 % (ref 39.0–52.0)
HEMOGLOBIN: 13.7 g/dL (ref 13.0–17.0)
LYMPHS ABS: 1.9 10*3/uL (ref 0.7–4.0)
LYMPHS PCT: 29 %
MCH: 30 pg (ref 26.0–34.0)
MCHC: 34.3 g/dL (ref 30.0–36.0)
MCV: 87.5 fL (ref 78.0–100.0)
Monocytes Absolute: 0.4 10*3/uL (ref 0.1–1.0)
Monocytes Relative: 6 %
NEUTROS PCT: 59 %
Neutro Abs: 4 10*3/uL (ref 1.7–7.7)
Platelets: 210 10*3/uL (ref 150–400)
RBC: 4.57 MIL/uL (ref 4.22–5.81)
RDW: 14.5 % (ref 11.5–15.5)
WBC: 6.7 10*3/uL (ref 4.0–10.5)

## 2016-05-04 LAB — COMPREHENSIVE METABOLIC PANEL
ALK PHOS: 95 U/L (ref 38–126)
ALT: 38 U/L (ref 17–63)
AST: 36 U/L (ref 15–41)
Albumin: 4.1 g/dL (ref 3.5–5.0)
Anion gap: 7 (ref 5–15)
BUN: 21 mg/dL — ABNORMAL HIGH (ref 6–20)
CALCIUM: 9 mg/dL (ref 8.9–10.3)
CO2: 24 mmol/L (ref 22–32)
CREATININE: 0.88 mg/dL (ref 0.61–1.24)
Chloride: 104 mmol/L (ref 101–111)
Glucose, Bld: 176 mg/dL — ABNORMAL HIGH (ref 65–99)
Potassium: 3.8 mmol/L (ref 3.5–5.1)
Sodium: 135 mmol/L (ref 135–145)
Total Bilirubin: 0.5 mg/dL (ref 0.3–1.2)
Total Protein: 7.2 g/dL (ref 6.5–8.1)

## 2016-05-04 LAB — MAGNESIUM: MAGNESIUM: 2 mg/dL (ref 1.7–2.4)

## 2016-05-04 MED ORDER — PALONOSETRON HCL INJECTION 0.25 MG/5ML
0.2500 mg | Freq: Once | INTRAVENOUS | Status: AC
  Administered 2016-05-04: 0.25 mg via INTRAVENOUS
  Filled 2016-05-04: qty 5

## 2016-05-04 MED ORDER — DEXAMETHASONE SODIUM PHOSPHATE 10 MG/ML IJ SOLN
10.0000 mg | Freq: Once | INTRAMUSCULAR | Status: AC
  Administered 2016-05-04: 10 mg via INTRAVENOUS
  Filled 2016-05-04: qty 1

## 2016-05-04 MED ORDER — ONDANSETRON HCL 8 MG PO TABS: 8.0000 mg | ORAL_TABLET | Freq: Three times a day (TID) | ORAL | 2 refills | Status: AC | PRN

## 2016-05-04 MED ORDER — PROCHLORPERAZINE MALEATE 10 MG PO TABS: 10.0000 mg | ORAL_TABLET | Freq: Four times a day (QID) | ORAL | 0 refills | Status: DC | PRN

## 2016-05-04 MED ORDER — IRINOTECAN HCL CHEMO INJECTION 100 MG/5ML
180.0000 mg/m2 | Freq: Once | INTRAVENOUS | Status: AC
  Administered 2016-05-04: 400 mg via INTRAVENOUS
  Filled 2016-05-04: qty 10

## 2016-05-04 MED ORDER — SODIUM CHLORIDE 0.9 % IV SOLN
Freq: Once | INTRAVENOUS | Status: AC
  Administered 2016-05-04: 10:00:00 via INTRAVENOUS

## 2016-05-04 MED ORDER — SODIUM CHLORIDE 0.9 % IV SOLN
5.9000 mg/kg | Freq: Once | INTRAVENOUS | Status: AC
  Administered 2016-05-04: 600 mg via INTRAVENOUS
  Filled 2016-05-04: qty 20

## 2016-05-04 MED ORDER — SODIUM CHLORIDE 0.9 % IV SOLN: 10.0000 mg | Freq: Once | INTRAVENOUS | Status: DC

## 2016-05-04 MED ORDER — HEPARIN SOD (PORK) LOCK FLUSH 100 UNIT/ML IV SOLN
500.0000 [IU] | Freq: Once | INTRAVENOUS | Status: AC | PRN
  Administered 2016-05-04: 500 [IU]
  Filled 2016-05-04 (×2): qty 5

## 2016-05-04 MED ORDER — PROCHLORPERAZINE MALEATE 10 MG PO TABS: 10.0000 mg | ORAL_TABLET | Freq: Four times a day (QID) | ORAL | 2 refills | Status: AC | PRN

## 2016-05-04 MED ORDER — ATROPINE SULFATE 1 MG/ML IJ SOLN
0.5000 mg | Freq: Once | INTRAMUSCULAR | Status: AC | PRN
  Administered 2016-05-04: 0.5 mg via INTRAVENOUS
  Filled 2016-05-04: qty 1

## 2016-05-04 MED ORDER — SODIUM CHLORIDE 0.9% FLUSH
10.0000 mL | INTRAVENOUS | Status: DC | PRN
  Administered 2016-05-04: 10 mL
  Filled 2016-05-04: qty 10

## 2016-05-04 NOTE — Progress Notes (Signed)
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  INTERVAL HISTORY: Tom [name] 50 y.o. 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  KRAS WT.    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 Appearance is most likely to represent diffuse hepatic metastasis      07/30/2014 Initial Diagnosis    Colon, biopsy, sigmoid - ADENOCARCINOMA.      07/31/2014 Tumor Marker    CEA- 48.0      07/31/2014 Imaging    Ct abd/pelvis- concerning for primary colorectal neoplasm in the mid to distal sigmoid colon, with lymphadenopathy in the sigmoid mesocolon, ileocolic mesentery, and retroperitoneum, as well as widespread metastatic disease to the liver      08/01/2014 Imaging    CT chest- Pathologic thoracic, right hilar, and infrahilar adenopathy associated with a masslike region of possible consolidation in the right middle lobe, surrounding nodularity, and some scattered bilateral pulmonary nodules.      08/06/2014 - 12/10/2014 Chemotherapy    FOLFOX.  Avastin added for cycle 2.  S/P 10 cycles.  Change to maintenance therapy secondary to positive response to therapy, increasing PN, and progressive proteinuria.      10/08/2014 Adverse Reaction    Avastin induced proteinuria >/= 2 gm Avastin on hold      10/15/2014 Treatment Plan Change    Avastin held for  proteinuria.      10/22/2014 Imaging    Ct CAP- 1. Interval decrease and soft tissue adjacent to the mid sigmoid colon with decreasing sigmoid colon wall thickening. The  leocolic, mesenteric, perirectal, and mesocolonic lymphadenopathy has decreased in the interval.      10/30/2014 Survivorship    Genetic Counseling. Genetic testing was normal, and did not reveal a deleterious mutation in these genes. The test report will be scanned into EPIC and will be located under the Media tab.       11/26/2014 Treatment Plan Change    Oxaliplatin reduced x 25% due to PN.      12/24/2014 Treatment Plan Change    Change to maintanence therapy.      01/09/2015 - 03/26/2015 Chemotherapy    Xeloda 2500 mg BID, 7 days on and 7 days off.      01/30/2015 Imaging    CT in ED- Evidence of distal small bowel obstruction with transition point over the ileum in the right upper pelvis immediately adjacent/abutting the known malignant stricturing of the sigmoid colon. There is wall thickening of the sigmoid colon wi...      01/30/2015 - 01/31/2015 Hospital Admission    SBO (small bowel obstruction)      03/26/2015 Treatment Plan Change    Xeloda cost-prohibitive without financial help      04/15/2015 -  Chemotherapy    5FU/Leucovorin days 1-5 every 28 days      05/13/2015 Adverse  Reaction    Palmar-Plantar erythrodysesthesia      05/13/2015 Treatment Plan Change    5FU/Leucovorin held      05/28/2015 Treatment Plan Change    5FU dose reduced by 20%      07/03/2015 Imaging    CT CAP- Resolution right-sided pulmonary nodules and decrease in size of small mediastinal nodes. Response to therapy of hepatic metastasis. Similar to slight increase in suspicious nodes in the high mesorectum.      10/29/2015 Imaging    CT CAP- Further reduction in size of the hepatic metastatic tumors, compatible with interval effective therapy. 2 mm left upper lobe pulmonary nodule has been stable over the past 15 months.  Likely benign, may merit surveillance.       04/14/2016 Imaging    CT CAP- 1. Enlarging metastatic lesions in the liver with at least 1 new lesion. Appearance compatible with progressive malignancy. 2. Slight worsening of the nodularity around the proximal portion of the inferior mesenteric artery, with cicatricial reaction in density along the lower portion of the IMA exerting some traction on the adjacent mildly thickened sigmoid colon. Some of this could be postinflammatory of local tumor is difficult to exclude. 3. Several tiny pulmonary nodules are stable from 07/31/2014, and most likely benign.      04/14/2016 Progression    CT scan demonstrates enlarging hepatic lesions and 1 new hepatic lesion (at least)      05/04/2016 Treatment Plan Change    Change in therapy       05/04/2016 -  Chemotherapy    Irinotecan+Panitumumab.       He reports some issues with constipation and I have reviewed the constipation sheet.  He is here today to review his scans and learn about the change in therapy.    We discussed some of the common side effects of therapy including, but not limited to, diarrhea, nausea/vomiting, acne-rash, allergic reaction, death.  He continues to have low back pain.  This is unexplained on recent imaging.  Review of Systems  Constitutional: Negative.  Negative for chills and fever.  HENT: Negative.   Eyes: Negative.   Respiratory: Negative.  Negative for cough, hemoptysis and shortness of breath.   Cardiovascular: Negative.  Negative for chest pain.  Gastrointestinal: Positive for constipation. Negative for blood in stool, diarrhea, melena, nausea and vomiting.  Genitourinary: Negative.   Musculoskeletal: Positive for back pain.  Neurological: Negative.   Endo/Heme/Allergies: Negative.   Psychiatric/Behavioral: Negative.     Past Medical History:  Diagnosis Date  . 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 COLONOSCOPY N/A 07/30/2014   Procedure: COLONOSCOPY;  Surgeon: Inda Castle, MD;  Location: [facility] ENDOSCOPY;  Service: Endoscopy;  Laterality: N/A;  . ESOPHAGOGASTRODUODENOSCOPY N/A 07/30/2014   Procedure: ESOPHAGOGASTRODUODENOSCOPY (EGD);  Surgeon: Inda Castle, MD;  Location: [facility];  Service: Endoscopy;  Laterality: N/A;  . OPERATIVE ULTRASOUND N/A 08/01/2014   Procedure: OPERATIVE ULTRASOUND;  Surgeon: Gayland Curry, MD;  Location: [facility] ORS;  Service: General;  Laterality: N/A;  . PORTACATH PLACEMENT Right 08/01/14  . PORTACATH PLACEMENT N/A 08/01/2014   Procedure: INSERTION RIGHT IJ PORT-A-CATH WITH ULTRASOUND AND FLUORO;  Surgeon: Gayland Curry, MD;  Location: [facility] ORS;  Service: General;  Laterality: N/A;    Family History  Problem Relation Age of Onset  . Cancer Mother     Deceased at 38 years old from metastatic colon cancer  .  Kidney failure Father   . Heart disease Father     Deceased in 40-50's  . Colon polyps Brother   . Cancer Maternal Aunt     Social History   Social History  . Marital status: Married    Spouse name: N/A  . Number of children: N/A  . Years of education: N/A   Social History Main Topics  . Smoking status: Never Smoker  . Smokeless tobacco: Former Systems developer    Types: Chew    Quit date: 06/01/1989  . Alcohol use No  . Drug use: No  . Sexual activity: Not Asked   Other Topics Concern  . None   Social History Narrative  . None     PHYSICAL EXAMINATION  ECOG PERFORMANCE STATUS: 1 - Symptomatic but completely ambulatory  Vitals:   05/04/16 0901  BP: 139/79  Pulse: 71  Resp: 18  Temp: 97.7 F (36.5 C)    GENERAL:alert, no distress, well nourished, well developed, comfortable, cooperative, obese, smiling and accompanied by his ex-sister-in-law. SKIN: skin color, texture, turgor are normal.   HEAD: Normocephalic, No masses, lesions, tenderness or abnormalities EYES: normal, EOMI, Conjunctiva are pink  and non-injected EARS: External ears normal OROPHARYNX:lips, buccal mucosa, and tongue normal and mucous membranes are moist  NECK: supple, no adenopathy, trachea midline LYMPH:  no palpable lymphadenopathy BREAST:not examined LUNGS: clear to auscultation and percussion HEART: regular rate & rhythm, no murmurs, no gallops, S1 normal and S2 normal ABDOMEN:abdomen soft, non-tender, obese and normal bowel sounds BACK: Back symmetric, no curvature.  EXTREMITIES:less then 2 second capillary refill, no joint deformities, effusion, or inflammation, no skin discoloration, no cyanosis  NEURO: alert & oriented x 3 with fluent speech, no focal motor/sensory deficits, gait normal   LABORATORY DATA: CBC    Component Value Date/Time   WBC 6.7 05/04/2016 0908   RBC 4.57 05/04/2016 0908   HGB 13.7 05/04/2016 0908   HCT 40.0 05/04/2016 0908   PLT 210 05/04/2016 0908   MCV 87.5 05/04/2016 0908   MCH 30.0 05/04/2016 0908   MCHC 34.3 05/04/2016 0908   RDW 14.5 05/04/2016 0908   LYMPHSABS 1.9 05/04/2016 0908   MONOABS 0.4 05/04/2016 0908   EOSABS 0.3 05/04/2016 0908   BASOSABS 0.0 05/04/2016 0908      Chemistry      Component Value Date/Time   NA 135 05/04/2016 0908   K 3.8 05/04/2016 0908   CL 104 05/04/2016 0908   CO2 24 05/04/2016 0908   BUN 21 (H) 05/04/2016 0908   CREATININE 0.88 05/04/2016 0908      Component Value Date/Time   CALCIUM 9.0 05/04/2016 0908   ALKPHOS 95 05/04/2016 0908   AST 36 05/04/2016 0908   ALT 38 05/04/2016 0908   BILITOT 0.5 05/04/2016 0908     Lab Results  Component Value Date   CEA 6.2 (H) 03/30/2016     PENDING LABS:   RADIOGRAPHIC STUDIES:  Ct Chest W Contrast  Result Date: 04/14/2016 CLINICAL DATA:  Stage IV colon cancer with liver metastatic disease. Rising CEA level. EXAM: CT CHEST, ABDOMEN, AND PELVIS WITH CONTRAST TECHNIQUE: Multidetector CT imaging of the chest, abdomen and pelvis was performed following the standard protocol during  bolus administration of intravenous contrast. CONTRAST:  19m ISOVUE-300 IOPAMIDOL (ISOVUE-300) INJECTION 61% COMPARISON:  10/29/2015 FINDINGS: CT CHEST FINDINGS Cardiovascular: Coronary, aortic arch, and branch vessel atherosclerotic vascular disease. Right IJ Port-A-Cath tip: Cavoatrial junction. Mediastinum/Nodes: No pathologic thoracic adenopathy. Lungs/Pleura: 3 mm left apical subpleural nodule,  image 26/3, stable from 07/31/2014. 2 by 3 mm left upper lobe pulmonary nodule on image 58/3, stable from 07/31/2014. Musculoskeletal: Unremarkable CT ABDOMEN PELVIS FINDINGS Hepatobiliary: Metastatic lesion in the lateral segment left hepatic lobe 3.7 by 3.0 cm on image 51/2, formerly 1.6 by 1.1 cm. Metastatic lesion inferiorly in segment 4B, 5.0 by 4.3 cm, formerly 2.0 by 1.9 cm. There is a potentially new metastatic lesion posteriorly in the right hepatic lobe measuring 2.1 by 2.3 cm on image 58/2, not well seen on prior. Multiple additional lesions are present in the liver. Gallbladder unremarkable. Pancreas: Chronic mild fullness in the tail the pancreas without an obvious mass or hypoenhancement. Spleen: Unremarkable Adrenals/Urinary Tract: Left renal cysts. Mild urinary bladder wall thickening. Adrenal glands unremarkable. Stomach/Bowel: Sigmoid colon wall thickening noted with some tethering and accentuated density along the upper margin of the sigmoid colon tracking around the inferior mesenteric artery. This appearance is similar to prior. Vascular/Lymphatic: Aortoiliac atherosclerotic vascular disease. Nodularity around the proximal portion of the inferior mesenteric artery, 1.8 by 0.9 cm on image 79/2, formerly 1.4 by 0.7 cm. The nodularity along the lower portion of the IMA adjacent to the sigmoid colon appears similar to the prior exam with some cicatricial reaction. Reproductive: Unremarkable Other: On image 103/2, there is ring density around adipose tissue below the sigmoid colon likely from prior  inflammation. Mild presacral stranding. Upper perirectal lymph node 0.7 cm in short axis on image 102/2, stable. Musculoskeletal: Lumbar spondylosis and degenerative disc disease with likely impingement at L2-3, L3-4, L4-5, and L5-S1. Small umbilical hernia contains adipose tissue. Fatty spermatic cords. IMPRESSION: 1. Enlarging metastatic lesions in the liver with at least 1 new lesion. Appearance compatible with progressive malignancy. 2. Slight worsening of the nodularity around the proximal portion of the inferior mesenteric artery, with cicatricial reaction in density along the lower portion of the IMA exerting some traction on the adjacent mildly thickened sigmoid colon. Some of this could be postinflammatory of local tumor is difficult to exclude. 3. Several tiny pulmonary nodules are stable from 07/31/2014, and most likely benign. 4. Other imaging findings of potential clinical significance: Coronary, aortic arch, and branch vessel atherosclerotic vascular disease. Left renal cysts. Urinary bladder wall thickening, mild cystitis not excluded. Aortoiliac atherosclerotic vascular disease. Lumbar spondylosis and degenerative disc disease causing multilevel impingement. Electronically Signed   By: Van Clines M.D.   On: 04/14/2016 15:09   Ct Abdomen Pelvis W Contrast  Result Date: 04/14/2016 CLINICAL DATA:  Stage IV colon cancer with liver metastatic disease. Rising CEA level. EXAM: CT CHEST, ABDOMEN, AND PELVIS WITH CONTRAST TECHNIQUE: Multidetector CT imaging of the chest, abdomen and pelvis was performed following the standard protocol during bolus administration of intravenous contrast. CONTRAST:  168m ISOVUE-300 IOPAMIDOL (ISOVUE-300) INJECTION 61% COMPARISON:  10/29/2015 FINDINGS: CT CHEST FINDINGS Cardiovascular: Coronary, aortic arch, and branch vessel atherosclerotic vascular disease. Right IJ Port-A-Cath tip: Cavoatrial junction. Mediastinum/Nodes: No pathologic thoracic adenopathy.  Lungs/Pleura: 3 mm left apical subpleural nodule, image 26/3, stable from 07/31/2014. 2 by 3 mm left upper lobe pulmonary nodule on image 58/3, stable from 07/31/2014. Musculoskeletal: Unremarkable CT ABDOMEN PELVIS FINDINGS Hepatobiliary: Metastatic lesion in the lateral segment left hepatic lobe 3.7 by 3.0 cm on image 51/2, formerly 1.6 by 1.1 cm. Metastatic lesion inferiorly in segment 4B, 5.0 by 4.3 cm, formerly 2.0 by 1.9 cm. There is a potentially new metastatic lesion posteriorly in the right hepatic lobe measuring 2.1 by 2.3 cm on image 58/2, not well seen on prior.  Multiple additional lesions are present in the liver. Gallbladder unremarkable. Pancreas: Chronic mild fullness in the tail the pancreas without an obvious mass or hypoenhancement. Spleen: Unremarkable Adrenals/Urinary Tract: Left renal cysts. Mild urinary bladder wall thickening. Adrenal glands unremarkable. Stomach/Bowel: Sigmoid colon wall thickening noted with some tethering and accentuated density along the upper margin of the sigmoid colon tracking around the inferior mesenteric artery. This appearance is similar to prior. Vascular/Lymphatic: Aortoiliac atherosclerotic vascular disease. Nodularity around the proximal portion of the inferior mesenteric artery, 1.8 by 0.9 cm on image 79/2, formerly 1.4 by 0.7 cm. The nodularity along the lower portion of the IMA adjacent to the sigmoid colon appears similar to the prior exam with some cicatricial reaction. Reproductive: Unremarkable Other: On image 103/2, there is ring density around adipose tissue below the sigmoid colon likely from prior inflammation. Mild presacral stranding. Upper perirectal lymph node 0.7 cm in short axis on image 102/2, stable. Musculoskeletal: Lumbar spondylosis and degenerative disc disease with likely impingement at L2-3, L3-4, L4-5, and L5-S1. Small umbilical hernia contains adipose tissue. Fatty spermatic cords. IMPRESSION: 1. Enlarging metastatic lesions in the  liver with at least 1 new lesion. Appearance compatible with progressive malignancy. 2. Slight worsening of the nodularity around the proximal portion of the inferior mesenteric artery, with cicatricial reaction in density along the lower portion of the IMA exerting some traction on the adjacent mildly thickened sigmoid colon. Some of this could be postinflammatory of local tumor is difficult to exclude. 3. Several tiny pulmonary nodules are stable from 07/31/2014, and most likely benign. 4. Other imaging findings of potential clinical significance: Coronary, aortic arch, and branch vessel atherosclerotic vascular disease. Left renal cysts. Urinary bladder wall thickening, mild cystitis not excluded. Aortoiliac atherosclerotic vascular disease. Lumbar spondylosis and degenerative disc disease causing multilevel impingement. Electronically Signed   By: Van Clines M.D.   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  As a result, he was switched to Xeloda 2500 mg BID, 7 days on and 7 days as a maintenance treatment.  In October 2016, this treatment became cost-prohibitive and therefore, on 04/15/2015, he was transitioned to treatment with 5FU/Leucovorin in a days 1-5 every 28 day fashion.  Genetic Counseling is complete and is negative.  Now with progression of disease on November 2017 imaging resulting in a change of therapy to Irinotecan/Panitumumab beginning on 05/04/2016.  Oncology history is updated.  Labs today: CBC diff, CMET.  I personally reviewed and went over laboratory results with the patient.  The results are noted within this dictation.    I personally reviewed and went over radiographic studies with the patient.   The results are noted within this dictation.  I reviewed his progressive hepatic lesions.    He asked about the role of surgery and he is advised that he is not a surgical candidate.  He was reading some information provided to him from our nurse navigator regarding the fact that his disease is incurable, yet treatable.  He acted like this was news to him.  I know we have discussed this unfortunate information with him in the past.  This information is validated with the patient.  I reviewed the risks, benefits, alternatives, and side effects of treatment with  the patient.  I have refilled his Compazine and Zofran as his previous Rx expired.  He has not needed antiemetics with past treatments to date.  Given his ongoing back discomfort, without obvious explanation for cause on CT imaging, I have ordered an MRI of L-spine to further investigate.  Return in 1 week for NADIR check.  More than 50% of the time spent with the patient was utilized for counseling and coordination of care.  Addendum: During treatment today, he had 3 loosely formed stools.  He was given Atropine as a result, and nursing called in Lomotil for him.  He may have issues with irinotecan-induced diarrhea.  We will need to be on the look-out for this.   ORDERS PLACED FOR THIS ENCOUNTER: Orders Placed This Encounter  Procedures  . MR Lumbar Spine W Wo Contrast    MEDICATIONS PRESCRIBED THIS ENCOUNTER: Meds ordered this encounter  Medications  . ondansetron (ZOFRAN) 8 MG tablet    Sig: Take 1 tablet (8 mg total) by mouth every 8 (eight) hours as needed for nausea or vomiting.    Dispense:  30 tablet    Refill:  2    Order Specific Question:   Supervising Provider    Answer:   Patrici Ranks [id number]  . prochlorperazine (COMPAZINE) 10 MG tablet    Sig: Take 1 tablet (10 mg total) by mouth every 6 (six) hours as needed for nausea or vomiting.    Dispense:  30 tablet    Refill:  2    Order Specific Question:    Supervising Provider    Answer:   Patrici Ranks [id number]    THERAPY PLAN:  Change in therapy to Irinotecan/Panitumumab beginning today.  All questions were answered. The patient knows to call the clinic with any problems, questions or concerns. We can certainly see the patient much sooner if necessary.  Patient and plan discussed with Dr. Ancil Linsey and she is in agreement with the aforementioned.   This note is electronically signed by: Doy Mince 05/04/2016 6:08 PM

## 2016-05-04 NOTE — Patient Instructions (Signed)
Tom [name] at [facility] Discharge Instructions  RECOMMENDATIONS MADE BY THE CONSULTANT AND ANY TEST RESULTS WILL BE SENT TO YOUR REFERRING PHYSICIAN.  Exam with Robynn Pane, PA. Labs and treatment today. Return next week for follow up.  Please see [name] as you leave today for your appointments.    Thank you for choosing [place] at [facility] to provide your oncology and hematology care.  To afford each patient quality time with our provider, please arrive at least 15 minutes before your scheduled appointment time.   Beginning January 23rd 2017 lab work for the [name] will be done in the  Main lab at [facility] on 1st floor. If you have a lab appointment with the [facility] please come in thru the  Main Entrance and check in at the main information desk  You need to re-schedule your appointment should you arrive 10 or more minutes late.  We strive to give you quality time with our providers, and arriving late affects you and other patients whose appointments are after yours.  Also, if you no show three or more times for appointments you may be dismissed from the clinic at the providers discretion.     Again, thank you for choosing [facility].  Our hope is that these requests will decrease the amount of time that you wait before being seen by our physicians.       _____________________________________________________________  Should you have questions after your visit to [facility], please contact our office at [phone] between the hours of 8:30 a.m. and 4:30 p.m.  Voicemails left after 4:30 p.m. will not be returned until the following business day.  For prescription refill requests, have your pharmacy contact our office.         Resources For Cancer Patients and their Caregivers ? American Cancer Society: Can assist with transportation, wigs, general needs, runs Look Good Feel Better.         419-692-4957 ? Cancer Care: Provides financial assistance, online support groups, medication/co-pay assistance.  1-800-813-HOPE 513 405 6520) ? [facility] Assists [place] cancer patients and their families through emotional , educational and financial support.  (432)176-0018 ? [place] DSS Where to apply for food stamps, Medicaid and utility assistance. (435)188-4923 ? [facility]: Transportation to medical appointments. 213 873 2193 ? Social Security Administration: May apply for disability if have a Stage IV cancer. (754) 515-6152 (651)123-3494 ? [employer], Disability and Transit Services: Assists with nutrition, care and transit needs. [place] Support Programs: @10RELATIVEDAYS @ > Cancer Support Group  2nd Tuesday of the month 1pm-2pm, Journey Room  > Creative Journey  3rd Tuesday of the month 1130am-1pm, Journey Room  > Look Good Feel Better  1st Wednesday of the month 10am-12 noon, Journey Room (Call [place] to register [phone])

## 2016-05-04 NOTE — Assessment & Plan Note (Addendum)
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  As a result, he was switched to Xeloda 2500 mg BID, 7 days on and 7 days as a maintenance treatment.  In October 2016, this treatment became cost-prohibitive and therefore, on 04/15/2015, he was transitioned to treatment with 5FU/Leucovorin in a days 1-5 every 28 day fashion.  Genetic Counseling is complete and is negative.  Now with progression of disease on November 2017 imaging resulting in a change of therapy to Irinotecan/Panitumumab beginning on 05/04/2016.  Oncology history is updated.  Labs today: CBC diff, CMET.  I personally reviewed and went over laboratory results with the patient.  The results are noted within this dictation.    I personally reviewed and went over radiographic studies with the patient.  The results are noted within this dictation.  I reviewed his progressive hepatic lesions.    He asked about the role of surgery and he is advised that he is not a surgical candidate.  He was reading some information provided to him from our nurse navigator regarding the fact that his disease is incurable, yet treatable.  He acted like this was news to him.  I know we have discussed this unfortunate information with him in the past.  This information is validated with the patient.  I reviewed the risks, benefits, alternatives, and side effects of treatment with the patient.  I have refilled his Compazine and Zofran as his previous Rx expired.  He has not needed antiemetics with past treatments to date.  Given his ongoing back discomfort, without obvious explanation for cause on CT imaging, I have ordered an MRI of L-spine to further investigate.  Return in 1 week for NADIR check.  More than 50% of the time spent with the patient was utilized for counseling and coordination of  care.  Addendum: During treatment today, he had 3 loosely formed stools.  He was given Atropine as a result, and nursing called in Lomotil for him.  He may have issues with irinotecan-induced diarrhea.  We will need to be on the look-out for this.

## 2016-05-04 NOTE — Patient Instructions (Signed)
[facility] Discharge Instructions for Patients Receiving Chemotherapy   Beginning January 23rd 2017 lab work for the [facility] will be done in the  Main lab at [facility] on 1st floor. If you have a lab appointment with the [facility] please come in thru the  Main Entrance and check in at the main information desk   Today you received the following chemotherapy agents Irinotecan,vectibix  To help prevent nausea and vomiting after your treatment, we encourage you to take your nausea medication    If you develop nausea and vomiting, or diarrhea that is not controlled by your medication, call the clinic.  The clinic phone number is [phone]. Office hours are Monday-Friday 8:30am-5:00pm.  BELOW ARE SYMPTOMS THAT SHOULD BE REPORTED IMMEDIATELY:  *FEVER GREATER THAN 101.0 F  *CHILLS WITH OR WITHOUT FEVER  NAUSEA AND VOMITING THAT IS NOT CONTROLLED WITH YOUR NAUSEA MEDICATION  *UNUSUAL SHORTNESS OF BREATH  *UNUSUAL BRUISING OR BLEEDING  TENDERNESS IN MOUTH AND THROAT WITH OR WITHOUT PRESENCE OF ULCERS  *URINARY PROBLEMS  *BOWEL PROBLEMS  UNUSUAL RASH Items with * indicate a potential emergency and should be followed up as soon as possible. If you have an emergency after office hours please contact your primary care physician or go to the nearest emergency department.  Please call the clinic during office hours if you have any questions or concerns.   You may also contact the Patient Navigator at [phone] should you have any questions or need assistance in obtaining follow up care.      Resources For Cancer Patients and their Caregivers ? [employer]: Can assist with transportation, wigs, general needs, runs Look Good Feel Better.        ([phone] 305-1251 ? Cancer Care: Provides financial assistance, online support groups, medication/co-pay assistance.  1-800-813-HOPE 403-025-8422) ? [facility] Assists  [place] cancer patients and their families through emotional , educational and financial support.  815-656-5021 ? [place] DSS Where to apply for food stamps, Medicaid and utility assistance. 613-300-6071 ? [facility]: Transportation to medical appointments. (630)468-0205 ? Social Security Administration: May apply for disability if have a Stage IV cancer. 541-579-6439 (514) 176-0416 ? [employer], Disability and Transit Services: Assists with nutrition, care and transit needs. [phone]

## 2016-05-05 NOTE — Progress Notes (Signed)
24 hour follow up. Patient states he is feeling pretty good. No problems. Informed patient to call us for any concerns or questions.

## 2016-05-06 ENCOUNTER — Encounter (HOSPITAL_COMMUNITY): Payer: Self-pay

## 2016-05-08 ENCOUNTER — Ambulatory Visit (HOSPITAL_COMMUNITY)
Admission: RE | Admit: 2016-05-08 | Discharge: 2016-05-08 | Disposition: A | Discharge status: Home / Self Care | Payer: Medicaid Other | Source: Ambulatory Visit | Attending: Oncology | Admitting: Oncology

## 2016-05-08 DIAGNOSIS — M5126 Other intervertebral disc displacement, lumbar region: Secondary | ICD-10-CM | POA: Diagnosis not present

## 2016-05-08 DIAGNOSIS — M545 Low back pain, unspecified: Secondary | ICD-10-CM

## 2016-05-08 DIAGNOSIS — M47816 Spondylosis without myelopathy or radiculopathy, lumbar region: Secondary | ICD-10-CM | POA: Diagnosis not present

## 2016-05-08 DIAGNOSIS — M48061 Spinal stenosis, lumbar region without neurogenic claudication: Secondary | ICD-10-CM | POA: Insufficient documentation

## 2016-05-08 DIAGNOSIS — M1288 Other specific arthropathies, not elsewhere classified, other specified site: Secondary | ICD-10-CM | POA: Insufficient documentation

## 2016-05-08 MED ORDER — GADOBENATE DIMEGLUMINE 529 MG/ML IV SOLN
20.0000 mL | Freq: Once | INTRAVENOUS | Status: AC | PRN
  Administered 2016-05-08: 20 mL via INTRAVENOUS

## 2016-05-10 NOTE — Assessment & Plan Note (Addendum)
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  As a result, he was switched to Xeloda 2500 mg BID, 7 days on and 7 days as a maintenance treatment.  In October 2016, this treatment became cost-prohibitive and therefore, on 04/15/2015, he was transitioned to treatment with 5FU/Leucovorin in a days 1-5 every 28 day fashion.  Genetic Counseling is complete and is negative.  Now with progression of disease on November 2017 imaging resulting in a change of therapy to Irinotecan/Panitumumab beginning on 05/04/2016.  Oncology history is updated.  Labs today: CBC diff, CMET.  I personally reviewed and went over laboratory results with the patient.  The results are noted within this dictation.    I personally reviewed and went over radiographic studies with the patient.  The results are noted within this dictation.  MRI L-spine performed on 05/08/2016 is reviewed with the patient.  He does have a disc protrusion which is likely the cause of his back pain.  He reports improvement in his back pain.  He is advised to use Imodium and/or Lomotil for loose stools if needed.  He has developed a Panitumumab-induced rash on his face and upper chest.  Rx for Doxycycline and Cleocin is printed for him today.  Return next week for cycle #2.  Return in ~ 3 weeks for follow-up and cycle #3.

## 2016-05-10 NOTE — Progress Notes (Signed)
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  KR[facility] WT.    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 Appearance is most likely to represent diffuse hepatic metastasis      07/30/2014 Initial Diagnosis    Colon, biopsy, sigmoid - ADENOCARCINOMA.      07/31/2014 Tumor Marker    CEA- 48.0      07/31/2014 Imaging    Ct abd/pelvis- concerning for primary colorectal neoplasm in the mid to distal sigmoid colon, with lymphadenopathy in the sigmoid mesocolon, ileocolic mesentery, and retroperitoneum, as well as widespread metastatic disease to the liver      08/01/2014 Imaging    CT chest- Pathologic thoracic, right hilar, and infrahilar adenopathy associated with a masslike region of possible consolidation in the right middle lobe, surrounding nodularity, and some scattered bilateral pulmonary nodules.      08/06/2014 - 12/10/2014 Chemotherapy    FOLFOX.  Avastin added for cycle 2.  S/P 10 cycles.  Change to maintenance therapy secondary to positive response to therapy, increasing PN, and progressive proteinuria.      10/08/2014 Adverse  Reaction    Avastin induced proteinuria >/= 2 gm Avastin on hold      10/15/2014 Treatment Plan Change    Avastin held for proteinuria.      10/22/2014 Imaging    Ct CAP- 1. Interval decrease and soft tissue adjacent to the mid sigmoid colon with decreasing sigmoid colon wall thickening. The  leocolic, mesenteric, perirectal, and mesocolonic lymphadenopathy has decreased in the interval.      10/30/2014 Survivorship    Genetic Counseling. Genetic testing was normal, and did not reveal a deleterious mutation in these genes. The test report will be scanned into EPIC and will be located under the Media tab.       11/26/2014 Treatment Plan Change    Oxaliplatin reduced x 25% due to PN.      12/24/2014 Treatment Plan Change    Change to maintanence therapy.      01/09/2015 - 03/26/2015 Chemotherapy    Xeloda 2500 mg BID, 7 days on and 7 days off.      01/30/2015 Imaging    CT in ED- Evidence of distal small bowel obstruction with transition point over the ileum in the right upper pelvis immediately adjacent/abutting the known malignant stricturing of the sigmoid colon. There is wall thickening of the sigmoid colon wi...      01/30/2015 - 01/31/2015 Hospital Admission    SBO (small bowel obstruction)      03/26/2015 Treatment Plan Change    Xeloda cost-prohibitive without financial help      04/15/2015 -  Chemotherapy  5FU/Leucovorin days 1-5 every 28 days      05/13/2015 Adverse Reaction    Palmar-Plantar erythrodysesthesia      05/13/2015 Treatment Plan Change    5FU/Leucovorin held      05/28/2015 Treatment Plan Change    5FU dose reduced by 20%      07/03/2015 Imaging    CT CAP- Resolution right-sided pulmonary nodules and decrease in size of small mediastinal nodes. Response to therapy of hepatic metastasis. Similar to slight increase in suspicious nodes in the high mesorectum.      10/29/2015 Imaging    CT CAP- Further reduction in size of the hepatic metastatic  tumors, compatible with interval effective therapy. 2 mm left upper lobe pulmonary nodule has been stable over the past 15 months. Likely benign, may merit surveillance.       04/14/2016 Imaging    CT CAP- 1. Enlarging metastatic lesions in the liver with at least 1 new lesion. Appearance compatible with progressive malignancy. 2. Slight worsening of the nodularity around the proximal portion of the inferior mesenteric artery, with cicatricial reaction in density along the lower portion of the IMA exerting some traction on the adjacent mildly thickened sigmoid colon. Some of this could be postinflammatory of local tumor is difficult to exclude. 3. Several tiny pulmonary nodules are stable from 07/31/2014, and most likely benign.      04/14/2016 Progression    CT scan demonstrates enlarging hepatic lesions and 1 new hepatic lesion (at least)      05/04/2016 Treatment Plan Change    Change in therapy       05/04/2016 -  Chemotherapy    Irinotecan+Panitumumab.      05/08/2016 Imaging    MRI L-spine- 1. Lumbar spine spondylosis as described above. 2. At L4-5 there is a broad-based disc bulge with a small central disc protrusion. Bilateral lateral recess stenosis. Mild bilateral facet arthropathy. 3. No evidence of osseous malignancy.       He tolerated treatment well.  He did experience loose stools during treatment and therefore was given 1 dose of Atropine.  This resolved his loose stools.  He went about 48 hours post-treatment without a BM.  He then started to have loose stools.  He reports 3 loose stools per day.  He notes that it is a small amount each time.  He describes his stools has loosely formed.  He has not use anti-diarrheal medication at home.  "Does this chemo only treatment my liver lesions."  He is educated on the role of systemic chemotherapy.  He has developed a rash on his face and upper chest, suggestive of Panitumumab-induced acne-like rash.  Review of  Systems  Constitutional: Negative for chills, fever and weight loss.  HENT: Negative.   Eyes: Negative.   Respiratory: Negative.  Negative for cough.   Cardiovascular: Negative.  Negative for chest pain.  Gastrointestinal: Negative for constipation, diarrhea (loose stools 3/day), nausea and vomiting.  Genitourinary: Negative.  Negative for dysuria.  Musculoskeletal: Positive for back pain (None today).  Skin: Positive for rash. Negative for itching.  Neurological: Negative.  Negative for weakness.  Endo/Heme/Allergies: Negative.   Psychiatric/Behavioral: Negative.     Past Medical History:  Diagnosis Date  . 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 COLONOSCOPY N/A 07/30/2014   Procedure: COLONOSCOPY;  Surgeon: Inda Castle, MD;  Location: [facility] ENDOSCOPY;  Service: Endoscopy;  Laterality: N/A;  . ESOPHAGOGASTRODUODENOSCOPY N/A  07/30/2014   Procedure: ESOPHAGOGASTRODUODENOSCOPY (EGD);  Surgeon: Inda Castle, MD;  Location: [facility];  Service: Endoscopy;  Laterality: N/A;  . OPERATIVE ULTRASOUND N/A 08/01/2014   Procedure: OPERATIVE ULTRASOUND;  Surgeon: Gayland Curry, MD;  Location: [facility] ORS;  Service: General;  Laterality: N/A;  . PORTACATH PLACEMENT Right 08/01/14  . PORTACATH PLACEMENT N/A 08/01/2014   Procedure: INSERTION RIGHT IJ PORT-A-CATH WITH ULTRASOUND AND FLUORO;  Surgeon: Gayland Curry, MD;  Location: [facility] ORS;  Service: General;  Laterality: N/A;    Family History  Problem Relation Age of Onset  . Cancer Mother     Deceased at 92 years old from metastatic colon cancer  . Kidney failure Father   . Heart disease Father     Deceased in 40-50's  . Colon polyps Brother   . Cancer Maternal Aunt     Social History   Social History  . Marital status: Married    Spouse name: N/A  . Number of children: N/A  . Years of education: N/A   Social History Main Topics  . Smoking status: Never Smoker    . Smokeless tobacco: Former Systems developer    Types: Chew    Quit date: 06/01/1989  . Alcohol use No  . Drug use: No  . Sexual activity: Not Asked   Other Topics Concern  . None   Social History Narrative  . None     PHYSICAL EXAMINATION  ECOG PERFORMANCE STATUS: 1 - Symptomatic but completely ambulatory  Vitals:   05/11/16 0830  BP: 120/76  Pulse: 69  Resp: 16  Temp: 98.5 F (36.9 C)    GENERAL:alert, no distress, well nourished, well developed, comfortable, cooperative, obese, smiling and accompanied by his wife. SKIN: Acne rash on face and upper chest.  Clearly demarcated and separate lesions noted.    HEAD: Normocephalic, No masses, lesions, tenderness or abnormalities EYES: normal, EOMI, Conjunctiva are pink and non-injected EARS: External ears normal OROPHARYNX:lips, buccal mucosa, and tongue normal and mucous membranes are moist  NECK: supple, no adenopathy, trachea midline LYMPH:  no palpable lymphadenopathy BREAST:not examined LUNGS: clear to auscultation and percussion HEART: regular rate & rhythm, no murmurs, no gallops, S1 normal and S2 normal ABDOMEN:abdomen soft, non-tender, obese and normal bowel sounds BACK: Back symmetric, no curvature.  EXTREMITIES:less then 2 second capillary refill, no joint deformities, effusion, or inflammation, no skin discoloration, no cyanosis  NEURO: alert & oriented x 3 with fluent speech, no focal motor/sensory deficits, gait normal   LABORATORY DATA: CBC    Component Value Date/Time   WBC 6.7 05/04/2016 0908   RBC 4.57 05/04/2016 0908   HGB 13.7 05/04/2016 0908   HCT 40.0 05/04/2016 0908   PLT 210 05/04/2016 0908   MCV 87.5 05/04/2016 0908   MCH 30.0 05/04/2016 0908   MCHC 34.3 05/04/2016 0908   RDW 14.5 05/04/2016 0908   LYMPHSABS 1.9 05/04/2016 0908   MONOABS 0.4 05/04/2016 0908   EOSABS 0.3 05/04/2016 0908   BASOSABS 0.0 05/04/2016 0908      Chemistry      Component Value Date/Time   NA 135 05/04/2016 0908   K  3.8 05/04/2016 0908   CL 104 05/04/2016 0908   CO2 24 05/04/2016 0908   BUN 21 (H) 05/04/2016 0908   CREATININE 0.88 05/04/2016 0908      Component Value Date/Time   CALCIUM 9.0 05/04/2016 0908   ALKPHOS 95 05/04/2016 0908   AST 36 05/04/2016 0908   ALT 38 05/04/2016 0908  BILITOT 0.5 05/04/2016 0908     Lab Results  Component Value Date   CEA 6.2 (H) 03/30/2016     PENDING LABS:   RADIOGRAPHIC STUDIES:  Ct Chest W Contrast  Result Date: 04/14/2016 CLINICAL DATA:  Stage IV colon cancer with liver metastatic disease. Rising CEA level. EXAM: CT CHEST, ABDOMEN, AND PELVIS WITH CONTRAST TECHNIQUE: Multidetector CT imaging of the chest, abdomen and pelvis was performed following the standard protocol during bolus administration of intravenous contrast. CONTRAST:  142m ISOVUE-300 IOPAMIDOL (ISOVUE-300) INJECTION 61% COMPARISON:  10/29/2015 FINDINGS: CT CHEST FINDINGS Cardiovascular: Coronary, aortic arch, and branch vessel atherosclerotic vascular disease. Right IJ Port-A-Cath tip: Cavoatrial junction. Mediastinum/Nodes: No pathologic thoracic adenopathy. Lungs/Pleura: 3 mm left apical subpleural nodule, image 26/3, stable from 07/31/2014. 2 by 3 mm left upper lobe pulmonary nodule on image 58/3, stable from 07/31/2014. Musculoskeletal: Unremarkable CT ABDOMEN PELVIS FINDINGS Hepatobiliary: Metastatic lesion in the lateral segment left hepatic lobe 3.7 by 3.0 cm on image 51/2, formerly 1.6 by 1.1 cm. Metastatic lesion inferiorly in segment 4B, 5.0 by 4.3 cm, formerly 2.0 by 1.9 cm. There is a potentially new metastatic lesion posteriorly in the right hepatic lobe measuring 2.1 by 2.3 cm on image 58/2, not well seen on prior. Multiple additional lesions are present in the liver. Gallbladder unremarkable. Pancreas: Chronic mild fullness in the tail the pancreas without an obvious mass or hypoenhancement. Spleen: Unremarkable Adrenals/Urinary Tract: Left renal cysts. Mild urinary bladder wall  thickening. Adrenal glands unremarkable. Stomach/Bowel: Sigmoid colon wall thickening noted with some tethering and accentuated density along the upper margin of the sigmoid colon tracking around the inferior mesenteric artery. This appearance is similar to prior. Vascular/Lymphatic: Aortoiliac atherosclerotic vascular disease. Nodularity around the proximal portion of the inferior mesenteric artery, 1.8 by 0.9 cm on image 79/2, formerly 1.4 by 0.7 cm. The nodularity along the lower portion of the IMA adjacent to the sigmoid colon appears similar to the prior exam with some cicatricial reaction. Reproductive: Unremarkable Other: On image 103/2, there is ring density around adipose tissue below the sigmoid colon likely from prior inflammation. Mild presacral stranding. Upper perirectal lymph node 0.7 cm in short axis on image 102/2, stable. Musculoskeletal: Lumbar spondylosis and degenerative disc disease with likely impingement at L2-3, L3-4, L4-5, and L5-S1. Small umbilical hernia contains adipose tissue. Fatty spermatic cords. IMPRESSION: 1. Enlarging metastatic lesions in the liver with at least 1 new lesion. Appearance compatible with progressive malignancy. 2. Slight worsening of the nodularity around the proximal portion of the inferior mesenteric artery, with cicatricial reaction in density along the lower portion of the IMA exerting some traction on the adjacent mildly thickened sigmoid colon. Some of this could be postinflammatory of local tumor is difficult to exclude. 3. Several tiny pulmonary nodules are stable from 07/31/2014, and most likely benign. 4. Other imaging findings of potential clinical significance: Coronary, aortic arch, and branch vessel atherosclerotic vascular disease. Left renal cysts. Urinary bladder wall thickening, mild cystitis not excluded. Aortoiliac atherosclerotic vascular disease. Lumbar spondylosis and degenerative disc disease causing multilevel impingement. Electronically  Signed   By: WVan ClinesM.D.   On: 04/14/2016 15:09   Mr Lumbar Spine W Wo Contrast  Result Date: 05/08/2016 CLINICAL DATA:  Low back pain with left hip pain for the past couple years. No known injury. EXAM: MRI LUMBAR SPINE WITHOUT AND WITH CONTRAST TECHNIQUE: Multiplanar and multiecho pulse sequences of the lumbar spine were obtained without and with intravenous contrast. CONTRAST:  262mMULTIHANCE GADOBENATE DIMEGLUMINE  529 MG/ML IV SOLN COMPARISON:  None. FINDINGS: Segmentation:  Standard. Alignment:  Physiologic. Vertebrae:  No fracture, evidence of discitis, or bone lesion. Conus medullaris: Extends to the T12 level and appears normal. Paraspinal and other soft tissues: No paraspinal abnormality. Disc levels: Disc spaces: Degenerative disc disease with disc height loss at L2-3, L3-4, L4-5 and L5-S1. T12-L1: Broad-based disc bulge with a small central disc protrusion. No evidence of neural foraminal stenosis. No central canal stenosis. L1-L2: Broad-based disc bulge. No evidence of neural foraminal stenosis. No central canal stenosis. L2-L3: Broad-based disc bulge with a small central disc protrusion. Shallow right foraminal disc protrusion. No evidence of neural foraminal stenosis. No central canal stenosis. L3-L4: Broad-based disc bulge eccentric towards the right. Mild bilateral facet arthropathy. No evidence of neural foraminal stenosis. No central canal stenosis. L4-L5: Broad-based disc bulge with a small central disc protrusion. Bilateral lateral recess stenosis. Mild bilateral facet arthropathy. No evidence of neural foraminal stenosis. No central canal stenosis. L5-S1: Mild broad-based disc bulge. Mild left foraminal stenosis. No right foraminal stenosis. No central canal stenosis. IMPRESSION: 1. Lumbar spine spondylosis as described above. 2. At L4-5 there is a broad-based disc bulge with a small central disc protrusion. Bilateral lateral recess stenosis. Mild bilateral facet arthropathy. 3.  No evidence of osseous malignancy. Electronically Signed   By: Kathreen Devoid   On: 05/08/2016 13:42   Ct Abdomen Pelvis W Contrast  Result Date: 04/14/2016 CLINICAL DATA:  Stage IV colon cancer with liver metastatic disease. Rising CEA level. EXAM: CT CHEST, ABDOMEN, AND PELVIS WITH CONTRAST TECHNIQUE: Multidetector CT imaging of the chest, abdomen and pelvis was performed following the standard protocol during bolus administration of intravenous contrast. CONTRAST:  125m ISOVUE-300 IOPAMIDOL (ISOVUE-300) INJECTION 61% COMPARISON:  10/29/2015 FINDINGS: CT CHEST FINDINGS Cardiovascular: Coronary, aortic arch, and branch vessel atherosclerotic vascular disease. Right IJ Port-A-Cath tip: Cavoatrial junction. Mediastinum/Nodes: No pathologic thoracic adenopathy. Lungs/Pleura: 3 mm left apical subpleural nodule, image 26/3, stable from 07/31/2014. 2 by 3 mm left upper lobe pulmonary nodule on image 58/3, stable from 07/31/2014. Musculoskeletal: Unremarkable CT ABDOMEN PELVIS FINDINGS Hepatobiliary: Metastatic lesion in the lateral segment left hepatic lobe 3.7 by 3.0 cm on image 51/2, formerly 1.6 by 1.1 cm. Metastatic lesion inferiorly in segment 4B, 5.0 by 4.3 cm, formerly 2.0 by 1.9 cm. There is a potentially new metastatic lesion posteriorly in the right hepatic lobe measuring 2.1 by 2.3 cm on image 58/2, not well seen on prior. Multiple additional lesions are present in the liver. Gallbladder unremarkable. Pancreas: Chronic mild fullness in the tail the pancreas without an obvious mass or hypoenhancement. Spleen: Unremarkable Adrenals/Urinary Tract: Left renal cysts. Mild urinary bladder wall thickening. Adrenal glands unremarkable. Stomach/Bowel: Sigmoid colon wall thickening noted with some tethering and accentuated density along the upper margin of the sigmoid colon tracking around the inferior mesenteric artery. This appearance is similar to prior. Vascular/Lymphatic: Aortoiliac atherosclerotic vascular  disease. Nodularity around the proximal portion of the inferior mesenteric artery, 1.8 by 0.9 cm on image 79/2, formerly 1.4 by 0.7 cm. The nodularity along the lower portion of the IMA adjacent to the sigmoid colon appears similar to the prior exam with some cicatricial reaction. Reproductive: Unremarkable Other: On image 103/2, there is ring density around adipose tissue below the sigmoid colon likely from prior inflammation. Mild presacral stranding. Upper perirectal lymph node 0.7 cm in short axis on image 102/2, stable. Musculoskeletal: Lumbar spondylosis and degenerative disc disease with likely impingement at L2-3, L3-4, L4-5, and L5-S1.  Small umbilical hernia contains adipose tissue. Fatty spermatic cords. IMPRESSION: 1. Enlarging metastatic lesions in the liver with at least 1 new lesion. Appearance compatible with progressive malignancy. 2. Slight worsening of the nodularity around the proximal portion of the inferior mesenteric artery, with cicatricial reaction in density along the lower portion of the IMA exerting some traction on the adjacent mildly thickened sigmoid colon. Some of this could be postinflammatory of local tumor is difficult to exclude. 3. Several tiny pulmonary nodules are stable from 07/31/2014, and most likely benign. 4. Other imaging findings of potential clinical significance: Coronary, aortic arch, and branch vessel atherosclerotic vascular disease. Left renal cysts. Urinary bladder wall thickening, mild cystitis not excluded. Aortoiliac atherosclerotic vascular disease. Lumbar spondylosis and degenerative disc disease causing multilevel impingement. Electronically Signed   By: Van Clines M.D.   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  As a result, he was switched to Xeloda 2500 mg BID, 7 days on and 7 days as a maintenance treatment.  In October 2016, this treatment became cost-prohibitive and therefore, on 04/15/2015, he was transitioned to treatment with 5FU/Leucovorin in a days 1-5 every 28 day fashion.  Genetic Counseling is complete and is negative.  Now with progression of disease on November 2017 imaging resulting in a change of therapy to Irinotecan/Panitumumab beginning on 05/04/2016.  Oncology history is updated.  Labs today: CBC diff, CMET.  I personally reviewed and went over laboratory results with the patient.  The results are noted within this dictation.    I personally reviewed and went over radiographic studies with the patient.  The results are noted within this dictation.  MRI L-spine performed on 05/08/2016 is reviewed with the patient.  He does have a disc protrusion which is likely the cause of his back pain.  He reports improvement in his back pain.  He is advised to use Imodium and/or Lomotil for loose stools if needed.  He has developed a Panitumumab-induced rash on his face and upper chest.  Rx for Doxycycline and Cleocin is printed for him today.  Return next week for cycle #2.  Return in ~ 3 weeks for follow-up and cycle #3.   ORDERS PLACED FOR THIS ENCOUNTER: No orders of the defined types were placed in this encounter.   MEDICATIONS PRESCRIBED THIS ENCOUNTER: Meds ordered this encounter  Medications  . doxycycline (VIBRA-TABS) 100 MG tablet    Sig: Take 1 tablet (100 mg total) by mouth 2 (two) times daily.    Dispense:  14 tablet    Refill:  1    Order Specific Question:   Supervising Provider    Answer:   Patrici Ranks [id number]  . clindamycin (CLEOCIN-T) 1 % lotion    Sig: Apply topically 2 (two) times daily.    Dispense:  60 mL    Refill:  1    Order Specific Question:   Supervising Provider    Answer:   Patrici Ranks  [id number]    THERAPY PLAN:  Continue therapy with Irinotecan/Panitumumab.  All questions were answered. The patient knows to call the clinic with any problems, questions or concerns. We can certainly see the patient much sooner if necessary.  Patient and plan discussed with  Dr. Ancil Linsey and she is in agreement with the aforementioned.   This note is electronically signed by: Doy Mince 05/11/2016 9:09 AM

## 2016-05-11 ENCOUNTER — Encounter (HOSPITAL_BASED_OUTPATIENT_CLINIC_OR_DEPARTMENT_OTHER): Payer: Medicaid Other | Admitting: Oncology

## 2016-05-11 ENCOUNTER — Encounter (HOSPITAL_COMMUNITY): Payer: Self-pay | Admitting: Oncology

## 2016-05-11 ENCOUNTER — Encounter (HOSPITAL_COMMUNITY): Payer: Medicaid Other

## 2016-05-11 VITALS — BP 120/76 | HR 69 | Temp 98.5°F | Resp 16 | Wt 217.5 lb

## 2016-05-11 DIAGNOSIS — C189 Malignant neoplasm of colon, unspecified: Secondary | ICD-10-CM

## 2016-05-11 DIAGNOSIS — L708 Other acne: Secondary | ICD-10-CM

## 2016-05-11 DIAGNOSIS — M545 Low back pain: Secondary | ICD-10-CM | POA: Diagnosis not present

## 2016-05-11 DIAGNOSIS — L271 Localized skin eruption due to drugs and medicaments taken internally: Secondary | ICD-10-CM | POA: Diagnosis not present

## 2016-05-11 DIAGNOSIS — C7800 Secondary malignant neoplasm of unspecified lung: Secondary | ICD-10-CM

## 2016-05-11 DIAGNOSIS — C787 Secondary malignant neoplasm of liver and intrahepatic bile duct: Secondary | ICD-10-CM

## 2016-05-11 DIAGNOSIS — C187 Malignant neoplasm of sigmoid colon: Secondary | ICD-10-CM | POA: Diagnosis present

## 2016-05-11 LAB — CBC WITH DIFFERENTIAL/PLATELET
Basophils Absolute: 0 10*3/uL (ref 0.0–0.1)
Basophils Relative: 0 %
EOS ABS: 0.4 10*3/uL (ref 0.0–0.7)
EOS PCT: 4 %
HCT: 40.1 % (ref 39.0–52.0)
Hemoglobin: 13.9 g/dL (ref 13.0–17.0)
LYMPHS ABS: 2.3 10*3/uL (ref 0.7–4.0)
Lymphocytes Relative: 26 %
MCH: 30.5 pg (ref 26.0–34.0)
MCHC: 34.7 g/dL (ref 30.0–36.0)
MCV: 87.9 fL (ref 78.0–100.0)
MONOS PCT: 5 %
Monocytes Absolute: 0.5 10*3/uL (ref 0.1–1.0)
Neutro Abs: 5.8 10*3/uL (ref 1.7–7.7)
Neutrophils Relative %: 65 %
PLATELETS: 273 10*3/uL (ref 150–400)
RBC: 4.56 MIL/uL (ref 4.22–5.81)
RDW: 14.3 % (ref 11.5–15.5)
WBC: 8.8 10*3/uL (ref 4.0–10.5)

## 2016-05-11 LAB — COMPREHENSIVE METABOLIC PANEL
ALK PHOS: 85 U/L (ref 38–126)
ALT: 40 U/L (ref 17–63)
ANION GAP: 7 (ref 5–15)
AST: 27 U/L (ref 15–41)
Albumin: 4 g/dL (ref 3.5–5.0)
BUN: 13 mg/dL (ref 6–20)
CALCIUM: 8.9 mg/dL (ref 8.9–10.3)
CHLORIDE: 104 mmol/L (ref 101–111)
CO2: 24 mmol/L (ref 22–32)
Creatinine, Ser: 0.8 mg/dL (ref 0.61–1.24)
Glucose, Bld: 108 mg/dL — ABNORMAL HIGH (ref 65–99)
Potassium: 3.7 mmol/L (ref 3.5–5.1)
SODIUM: 135 mmol/L (ref 135–145)
Total Bilirubin: 0.6 mg/dL (ref 0.3–1.2)
Total Protein: 7 g/dL (ref 6.5–8.1)

## 2016-05-11 LAB — MAGNESIUM: MAGNESIUM: 2 mg/dL (ref 1.7–2.4)

## 2016-05-11 MED ORDER — CLINDAMYCIN PHOSPHATE 1 % EX LOTN
TOPICAL_LOTION | Freq: Two times a day (BID) | CUTANEOUS | 1 refills | Status: AC
Start: 2016-05-11 — End: ?

## 2016-05-11 MED ORDER — DOXYCYCLINE HYCLATE 100 MG PO TABS: 100.0000 mg | ORAL_TABLET | Freq: Two times a day (BID) | ORAL | 1 refills | Status: DC

## 2016-05-11 NOTE — Patient Instructions (Addendum)
Tom [name] at [facility] Discharge Instructions  RECOMMENDATIONS MADE BY THE CONSULTANT AND ANY TEST RESULTS WILL BE SENT TO YOUR REFERRING PHYSICIAN.  You saw [name], PA-C, today. Keep appointment s as scheduled. Treatment next week. Labs today. Use Imodium or Lomotil for loose stools. Prescription for Doxycycline and cleocin. See [name] at checkout for appointments.  Thank you for choosing [place] at [facility] to provide your oncology and hematology care.  To afford each patient quality time with our provider, please arrive at least 15 minutes before your scheduled appointment time.   Beginning January 23rd 2017 lab work for the [name] will be done in the  Main lab at [facility] on 1st floor. If you have a lab appointment with the [name] please come in thru the  Main Entrance and check in at the main information desk  You need to re-schedule your appointment should you arrive 10 or more minutes late.  We strive to give you quality time with our providers, and arriving late affects you and other patients whose appointments are after yours.  Also, if you no show three or more times for appointments you may be dismissed from the clinic at the providers discretion.     Again, thank you for choosing [facility].  Our hope is that these requests will decrease the amount of time that you wait before being seen by our physicians.       _____________________________________________________________  Should you have questions after your visit to [facility], please contact our office at [phone] between the hours of 8:30 a.m. and 4:30 p.m.  Voicemails left after 4:30 p.m. will not be returned until the following business day.  For prescription refill requests, have your pharmacy contact our office.         Resources For Cancer Patients and their Caregivers ? American Cancer Society: Can assist  with transportation, wigs, general needs, runs Look Good Feel Better.        406-648-6967 ? Cancer Care: Provides financial assistance, online support groups, medication/co-pay assistance.  1-800-813-HOPE 636-746-1163) ? [place] Assists [place] cancer patients and their families through emotional , educational and financial support.  5746561162 ? [place] DSS Where to apply for food stamps, Medicaid and utility assistance. ([phone] [phone] ? RCATS: Transportation to medical appointments. 573-278-0878 ? Social Security Administration: May apply for disability if have a Stage IV cancer. [phone])775-8462 (602)039-6090 ? [employer], Disability and Transit Services: Assists with nutrition, care and transit needs. [place] Support Programs: @10RELATIVEDAYS @ > Cancer Support Group  2nd Tuesday of the month 1pm-2pm, Journey Room  > Creative Journey  3rd Tuesday of the month 1130am-1pm, Journey Room  > Look Good Feel Better  1st Wednesday of the month 10am-12 noon, Journey Room (Call [place] to register [phone])

## 2016-05-18 ENCOUNTER — Encounter (HOSPITAL_COMMUNITY): Payer: Self-pay

## 2016-05-18 ENCOUNTER — Encounter (HOSPITAL_BASED_OUTPATIENT_CLINIC_OR_DEPARTMENT_OTHER): Payer: Medicaid Other | Admitting: Oncology

## 2016-05-18 ENCOUNTER — Encounter (HOSPITAL_BASED_OUTPATIENT_CLINIC_OR_DEPARTMENT_OTHER): Payer: Medicaid Other

## 2016-05-18 VITALS — BP 122/58 | HR 68 | Temp 98.3°F | Resp 16 | Wt 221.0 lb

## 2016-05-18 DIAGNOSIS — Z5111 Encounter for antineoplastic chemotherapy: Secondary | ICD-10-CM | POA: Diagnosis not present

## 2016-05-18 DIAGNOSIS — E876 Hypokalemia: Secondary | ICD-10-CM

## 2016-05-18 DIAGNOSIS — C787 Secondary malignant neoplasm of liver and intrahepatic bile duct: Secondary | ICD-10-CM

## 2016-05-18 DIAGNOSIS — Z5112 Encounter for antineoplastic immunotherapy: Secondary | ICD-10-CM | POA: Diagnosis not present

## 2016-05-18 DIAGNOSIS — C7802 Secondary malignant neoplasm of left lung: Secondary | ICD-10-CM | POA: Diagnosis not present

## 2016-05-18 DIAGNOSIS — L251 Unspecified contact dermatitis due to drugs in contact with skin: Secondary | ICD-10-CM

## 2016-05-18 DIAGNOSIS — C189 Malignant neoplasm of colon, unspecified: Secondary | ICD-10-CM

## 2016-05-18 DIAGNOSIS — C7801 Secondary malignant neoplasm of right lung: Secondary | ICD-10-CM

## 2016-05-18 LAB — COMPREHENSIVE METABOLIC PANEL
ALBUMIN: 3.1 g/dL — AB (ref 3.5–5.0)
ALT: 26 U/L (ref 17–63)
ANION GAP: 3 — AB (ref 5–15)
AST: 19 U/L (ref 15–41)
Alkaline Phosphatase: 64 U/L (ref 38–126)
BUN: 11 mg/dL (ref 6–20)
CHLORIDE: 113 mmol/L — AB (ref 101–111)
CO2: 23 mmol/L (ref 22–32)
Calcium: 7.3 mg/dL — ABNORMAL LOW (ref 8.9–10.3)
Creatinine, Ser: 0.56 mg/dL — ABNORMAL LOW (ref 0.61–1.24)
GFR calc Af Amer: >60 mL/min (ref >60)
GFR calc non Af Amer: >60 mL/min (ref >60)
GLUCOSE: 99 mg/dL (ref 65–99)
POTASSIUM: 2.8 mmol/L — AB (ref 3.5–5.1)
SODIUM: 139 mmol/L (ref 135–145)
TOTAL PROTEIN: 5.3 g/dL — AB (ref 6.5–8.1)
Total Bilirubin: 0.4 mg/dL (ref 0.3–1.2)

## 2016-05-18 LAB — CBC WITH DIFFERENTIAL/PLATELET
BASOS ABS: 0 10*3/uL (ref 0.0–0.1)
BASOS PCT: 1 %
EOS ABS: 0.2 10*3/uL (ref 0.0–0.7)
Eosinophils Relative: 4 %
HCT: 34 % — ABNORMAL LOW (ref 39.0–52.0)
Hemoglobin: 11.7 g/dL — ABNORMAL LOW (ref 13.0–17.0)
Lymphocytes Relative: 24 %
Lymphs Abs: 1.4 10*3/uL (ref 0.7–4.0)
MCH: 30.5 pg (ref 26.0–34.0)
MCHC: 34.4 g/dL (ref 30.0–36.0)
MCV: 88.8 fL (ref 78.0–100.0)
MONO ABS: 0.6 10*3/uL (ref 0.1–1.0)
MONOS PCT: 11 %
NEUTROS PCT: 60 %
Neutro Abs: 3.5 10*3/uL (ref 1.7–7.7)
PLATELETS: 211 10*3/uL (ref 150–400)
RBC: 3.83 MIL/uL — ABNORMAL LOW (ref 4.22–5.81)
RDW: 14.6 % (ref 11.5–15.5)
WBC: 5.8 10*3/uL (ref 4.0–10.5)

## 2016-05-18 LAB — MAGNESIUM: Magnesium: 1.6 mg/dL — ABNORMAL LOW (ref 1.7–2.4)

## 2016-05-18 MED ORDER — POTASSIUM CHLORIDE CRYS ER 20 MEQ PO TBCR: 40.0000 meq | EXTENDED_RELEASE_TABLET | Freq: Every day | ORAL | 1 refills | Status: AC

## 2016-05-18 MED ORDER — DEXAMETHASONE SODIUM PHOSPHATE 10 MG/ML IJ SOLN
10.0000 mg | Freq: Once | INTRAMUSCULAR | Status: AC
  Administered 2016-05-18: 10 mg via INTRAVENOUS
  Filled 2016-05-18: qty 1

## 2016-05-18 MED ORDER — SODIUM CHLORIDE 0.9 % IV SOLN
Freq: Once | INTRAVENOUS | Status: AC
  Administered 2016-05-18: 11:00:00 via INTRAVENOUS

## 2016-05-18 MED ORDER — PALONOSETRON HCL INJECTION 0.25 MG/5ML
0.2500 mg | Freq: Once | INTRAVENOUS | Status: AC
  Administered 2016-05-18: 0.25 mg via INTRAVENOUS
  Filled 2016-05-18: qty 5

## 2016-05-18 MED ORDER — HEPARIN SOD (PORK) LOCK FLUSH 100 UNIT/ML IV SOLN
500.0000 [IU] | Freq: Once | INTRAVENOUS | Status: AC | PRN
  Administered 2016-05-18: 500 [IU]

## 2016-05-18 MED ORDER — CALCIUM 1200 1200-1000 MG-UNIT PO CHEW: 1.0000 | CHEWABLE_TABLET | Freq: Every day | ORAL | 1 refills | Status: AC

## 2016-05-18 MED ORDER — MAGNESIUM OXIDE 400 (241.3 MG) MG PO TABS: 400.0000 mg | ORAL_TABLET | Freq: Two times a day (BID) | ORAL | 1 refills | Status: AC

## 2016-05-18 MED ORDER — SODIUM CHLORIDE 0.9% FLUSH
10.0000 mL | INTRAVENOUS | Status: DC | PRN
  Administered 2016-05-18: 10 mL
  Filled 2016-05-18: qty 10

## 2016-05-18 MED ORDER — ATROPINE SULFATE 1 MG/ML IJ SOLN
0.5000 mg | Freq: Once | INTRAMUSCULAR | Status: AC | PRN
Start: 2016-05-18 — End: 2016-05-18
  Administered 2016-05-18: 0.5 mg via INTRAVENOUS
  Filled 2016-05-18: qty 1

## 2016-05-18 MED ORDER — SODIUM CHLORIDE 0.9 % IV SOLN: 10.0000 mg | Freq: Once | INTRAVENOUS | Status: DC

## 2016-05-18 MED ORDER — DEXTROSE 5 % IV SOLN
180.0000 mg/m2 | Freq: Once | INTRAVENOUS | Status: AC
  Administered 2016-05-18: 400 mg via INTRAVENOUS
  Filled 2016-05-18: qty 20

## 2016-05-18 MED ORDER — SODIUM CHLORIDE 0.9 % IV SOLN
5.8900 mg/kg | Freq: Once | INTRAVENOUS | Status: AC
  Administered 2016-05-18: 600 mg via INTRAVENOUS
  Filled 2016-05-18: qty 30

## 2016-05-18 MED ORDER — POTASSIUM CHLORIDE CRYS ER 20 MEQ PO TBCR
80.0000 meq | EXTENDED_RELEASE_TABLET | Freq: Two times a day (BID) | ORAL | Status: DC
  Administered 2016-05-18: 80 meq via ORAL
  Filled 2016-05-18: qty 4

## 2016-05-18 MED ORDER — MAGNESIUM SULFATE 2 GM/50ML IV SOLN
2.0000 g | Freq: Once | INTRAVENOUS | Status: AC
  Administered 2016-05-18: 2 g via INTRAVENOUS
  Filled 2016-05-18: qty 50

## 2016-05-18 MED ORDER — DEXAMETHASONE SODIUM PHOSPHATE 10 MG/ML IJ SOLN
INTRAMUSCULAR | Status: AC
Start: 2016-05-18 — End: 2016-05-18
  Filled 2016-05-18: qty 1

## 2016-05-18 NOTE — Patient Instructions (Signed)
[facility] Discharge Instructions for Patients Receiving Chemotherapy   Beginning January 23rd 2017 lab work for the [facility] will be done in the  Main lab at [facility] on 1st floor. If you have a lab appointment with the [facility] please come in thru the  Main Entrance and check in at the main information desk   Today you received the following chemotherapy agents Irinotecan, vectibix  To help prevent nausea and vomiting after your treatment, we encourage you to take your nausea medications   If you develop nausea and vomiting, or diarrhea that is not controlled by your medication, call the clinic.  The clinic phone number is [phone]. Office hours are Monday-Friday 8:30am-5:00pm.  BELOW ARE SYMPTOMS THAT SHOULD BE REPORTED IMMEDIATELY:  *FEVER GREATER THAN 101.0 F  *CHILLS WITH OR WITHOUT FEVER  NAUSEA AND VOMITING THAT IS NOT CONTROLLED WITH YOUR NAUSEA MEDICATION  *UNUSUAL SHORTNESS OF BREATH  *UNUSUAL BRUISING OR BLEEDING  TENDERNESS IN MOUTH AND THROAT WITH OR WITHOUT PRESENCE OF ULCERS  *URINARY PROBLEMS  *BOWEL PROBLEMS  UNUSUAL RASH Items with * indicate a potential emergency and should be followed up as soon as possible. If you have an emergency after office hours please contact your primary care physician or go to the nearest emergency department.  Please call the clinic during office hours if you have any questions or concerns.   You may also contact the Patient Navigator at [phone] should you have any questions or need assistance in obtaining follow up care.      Resources For Cancer Patients and their Caregivers ? American Cancer Society: Can assist with transportation, wigs, general needs, runs Look Good Feel Better.        9565875770 ? Cancer Care: Provides financial assistance, online support groups, medication/co-pay assistance.  1-800-813-HOPE (903) 209-6972) ? [place] Assists  [place] cancer patients and their families through emotional , educational and financial support.  386-515-0642 ? [place] DSS Where to apply for food stamps, Medicaid and utility assistance. 616-798-7387 ? RCATS: Transportation to medical appointments. 415-058-3122 ? Social Security Administration: May apply for disability if have a Stage IV cancer. [phone] 774-785-4604 ? [employer], Disability and Transit Services: Assists with nutrition, care and transit needs. [phone]

## 2016-05-18 NOTE — Progress Notes (Signed)
Patient presented for chemo today. Rash is worse today then last week when he saw [name] PA-C. PA notified and will assess.  Labs drawn, reviewed with MD, proceed with Treatment. Kdur 4mEq given today, scripts given as prescribed. Informed patient to refill Doxycycine.  Magnesium and chemotherapy given today as ordered. Patient tolerated it well , no issues. Vitals stable and discharged home ambulatory from clinic

## 2016-05-18 NOTE — Progress Notes (Signed)
Tom [name] is seen in the clinic as a work-in.  He is scheduled today for his next treatment and his Panitumumab-induced rash has progressed.  Therefore, I was asked to see him.  He notes that the rash is worse.  He notes that it is pruritic.  He has been taking Doxycycline and using topical Cleocin.    Vitals - 1 value per visit [id number]  SYSTOLIC [id number]  DIASTOLIC 58  Pulse 68  Temperature 98.3  Respirations 16  Weight (lb) 221  Gen: NAD, pleasant, smiling HEENT: No erythema of eyes, atraumatic, normocephalic.  Rash Neuro: A and O x 3. Skin: Acne rash on faces, scalp, neck, upper thorax, back, and abdomen:         Assessment: 1. Panitumumab-induced acne rash, grade 2 2. Hypokalemia 3. Hypomagnesemia. 4. Hypocalcemia.  Plan: 1. Refill on Doxycycline 2. Rx for Ca++ 1200 mg and Vit D 1000 units 3. Rx for Kdur 40 mEq daily 4. 80 K+ PO in clinic today 5. Rx for Mag Oxide 400 mg BID. 6. 2 g IV magnesium today in clinic. 7. Tx today as planned.  No role for dosing adjustment at this time based upon rash. 8. Return as planned/scheduled for follow-up.  Patient and plan discussed with Dr. Ancil Linsey and she is in agreement with the aforementioned.   Tiffnay Bossi, PA-C 05/18/2016 7:01 PM

## 2016-05-20 ENCOUNTER — Encounter (HOSPITAL_COMMUNITY): Payer: Self-pay

## 2016-06-02 ENCOUNTER — Encounter: Payer: Self-pay | Admitting: *Deleted

## 2016-06-02 ENCOUNTER — Encounter (HOSPITAL_BASED_OUTPATIENT_CLINIC_OR_DEPARTMENT_OTHER): Payer: Medicaid Other | Admitting: Oncology

## 2016-06-02 ENCOUNTER — Encounter (HOSPITAL_COMMUNITY): Payer: Medicaid Other | Attending: Hematology & Oncology

## 2016-06-02 VITALS — BP 125/68 | HR 57 | Temp 98.0°F | Resp 18

## 2016-06-02 VITALS — BP 131/75 | HR 74 | Temp 97.6°F | Resp 20 | Wt 219.4 lb

## 2016-06-02 DIAGNOSIS — C189 Malignant neoplasm of colon, unspecified: Secondary | ICD-10-CM

## 2016-06-02 DIAGNOSIS — Z5112 Encounter for antineoplastic immunotherapy: Secondary | ICD-10-CM | POA: Diagnosis not present

## 2016-06-02 DIAGNOSIS — C187 Malignant neoplasm of sigmoid colon: Secondary | ICD-10-CM | POA: Diagnosis not present

## 2016-06-02 DIAGNOSIS — R197 Diarrhea, unspecified: Secondary | ICD-10-CM | POA: Diagnosis not present

## 2016-06-02 DIAGNOSIS — C7800 Secondary malignant neoplasm of unspecified lung: Secondary | ICD-10-CM

## 2016-06-02 DIAGNOSIS — C787 Secondary malignant neoplasm of liver and intrahepatic bile duct: Secondary | ICD-10-CM

## 2016-06-02 DIAGNOSIS — Z5111 Encounter for antineoplastic chemotherapy: Secondary | ICD-10-CM | POA: Diagnosis not present

## 2016-06-02 LAB — CBC WITH DIFFERENTIAL/PLATELET
Basophils Absolute: 0 10*3/uL (ref 0.0–0.1)
Basophils Relative: 0 %
EOS ABS: 0.2 10*3/uL (ref 0.0–0.7)
Eosinophils Relative: 3 %
HEMATOCRIT: 40.8 % (ref 39.0–52.0)
HEMOGLOBIN: 13.7 g/dL (ref 13.0–17.0)
LYMPHS PCT: 27 %
Lymphs Abs: 1.9 10*3/uL (ref 0.7–4.0)
MCH: 29.9 pg (ref 26.0–34.0)
MCHC: 33.6 g/dL (ref 30.0–36.0)
MCV: 89.1 fL (ref 78.0–100.0)
Monocytes Absolute: 0.4 10*3/uL (ref 0.1–1.0)
Monocytes Relative: 6 %
NEUTROS ABS: 4.4 10*3/uL (ref 1.7–7.7)
NEUTROS PCT: 64 %
Platelets: 266 10*3/uL (ref 150–400)
RBC: 4.58 MIL/uL (ref 4.22–5.81)
RDW: 14.4 % (ref 11.5–15.5)
WBC: 6.9 10*3/uL (ref 4.0–10.5)

## 2016-06-02 LAB — COMPREHENSIVE METABOLIC PANEL
ALBUMIN: 3.8 g/dL (ref 3.5–5.0)
ALK PHOS: 82 U/L (ref 38–126)
ALT: 34 U/L (ref 17–63)
AST: 29 U/L (ref 15–41)
Anion gap: 8 (ref 5–15)
BILIRUBIN TOTAL: 0.1 mg/dL — AB (ref 0.3–1.2)
BUN: 13 mg/dL (ref 6–20)
CALCIUM: 8.5 mg/dL — AB (ref 8.9–10.3)
CO2: 25 mmol/L (ref 22–32)
CREATININE: 0.69 mg/dL (ref 0.61–1.24)
Chloride: 105 mmol/L (ref 101–111)
GFR calc Af Amer: >60 mL/min (ref >60)
GFR calc non Af Amer: >60 mL/min (ref >60)
GLUCOSE: 143 mg/dL — AB (ref 65–99)
Potassium: 3.6 mmol/L (ref 3.5–5.1)
SODIUM: 138 mmol/L (ref 135–145)
TOTAL PROTEIN: 6.7 g/dL (ref 6.5–8.1)

## 2016-06-02 LAB — MAGNESIUM: Magnesium: 2 mg/dL (ref 1.7–2.4)

## 2016-06-02 MED ORDER — IRINOTECAN HCL CHEMO INJECTION 100 MG/5ML
180.0000 mg/m2 | Freq: Once | INTRAVENOUS | Status: AC
  Administered 2016-06-02: 400 mg via INTRAVENOUS
  Filled 2016-06-02: qty 20

## 2016-06-02 MED ORDER — HEPARIN SOD (PORK) LOCK FLUSH 100 UNIT/ML IV SOLN
INTRAVENOUS | Status: AC
  Filled 2016-06-02: qty 5

## 2016-06-02 MED ORDER — ATROPINE SULFATE 1 MG/ML IJ SOLN
0.5000 mg | Freq: Once | INTRAMUSCULAR | Status: AC | PRN
  Administered 2016-06-02: 0.5 mg via INTRAVENOUS
  Filled 2016-06-02: qty 1

## 2016-06-02 MED ORDER — SODIUM CHLORIDE 0.9 % IV SOLN
Freq: Once | INTRAVENOUS | Status: AC
  Administered 2016-06-02: 10:00:00 via INTRAVENOUS

## 2016-06-02 MED ORDER — SODIUM CHLORIDE 0.9 % IV SOLN
10.0000 mg | Freq: Once | INTRAVENOUS | Status: DC
  Filled 2016-06-02: qty 1

## 2016-06-02 MED ORDER — SODIUM CHLORIDE 0.9 % IV SOLN
5.8000 mg/kg | Freq: Once | INTRAVENOUS | Status: AC
  Administered 2016-06-02: 600 mg via INTRAVENOUS
  Filled 2016-06-02: qty 20

## 2016-06-02 MED ORDER — HEPARIN SOD (PORK) LOCK FLUSH 100 UNIT/ML IV SOLN
500.0000 [IU] | Freq: Once | INTRAVENOUS | Status: AC | PRN
  Administered 2016-06-02: 500 [IU]
  Filled 2016-06-02: qty 5

## 2016-06-02 MED ORDER — SODIUM CHLORIDE 0.9% FLUSH: 10.0000 mL | INTRAVENOUS | Status: DC | PRN

## 2016-06-02 MED ORDER — DEXAMETHASONE SODIUM PHOSPHATE 10 MG/ML IJ SOLN
10.0000 mg | Freq: Once | INTRAMUSCULAR | Status: AC
  Administered 2016-06-02: 10 mg via INTRAVENOUS
  Filled 2016-06-02: qty 1

## 2016-06-02 MED ORDER — SODIUM CHLORIDE 0.9 % IV SOLN: 10.0000 mg | Freq: Once | INTRAVENOUS | Status: DC

## 2016-06-02 MED ORDER — PALONOSETRON HCL INJECTION 0.25 MG/5ML
0.2500 mg | Freq: Once | INTRAVENOUS | Status: AC
  Administered 2016-06-02: 0.25 mg via INTRAVENOUS
  Filled 2016-06-02: qty 5

## 2016-06-02 NOTE — Assessment & Plan Note (Addendum)
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  As a result, he was switched to Xeloda 2500 mg BID, 7 days on and 7 days as a maintenance treatment.  In October 2016, this treatment became cost-prohibitive and therefore, on 04/15/2015, he was transitioned to treatment with 5FU/Leucovorin in a days 1-5 every 28 day fashion.  Genetic Counseling is complete and is negative.  Now with progression of disease in November 2017 imaging resulting in a change of therapy to Irinotecan/Panitumumab beginning on 05/04/2016.  Oncology history is updated.  Labs today: CBC diff, CMET, Magnesium, CEA.  I personally reviewed and went over laboratory results with the patient.  The results are noted within this dictation.  Labs satisfy treatment parameters.  He will continue with Imodium PRN.  He notes significant effectiveness of this medication to the point he needs stool softeners.  He is following the Imodium sheet and constipation sheet.  He does have an AM cough that is productive, but resolves by late AM.  He is advised to call if symptoms progress or worsen.  I would have a low threshold to provide him an antibiotic.  Return in 2 weeks for next cycle of chemotherapy and follow-up.

## 2016-06-02 NOTE — Progress Notes (Signed)
[place] Clinical Social Work  Clinical Social Work was referred by [place] rounding through infusion area. Clinical Social Worker met with patient briefly to offer support and assess for needs.  Pt open to talking with CSW and shared ongoing financial concerns.  CSW shared with pt information about possible resources to assist. Pt shared he has retroactive medicaid and should start with medicare in March. Pt plans to reach out to [facility] and Health Dept for possible assistance. CSW provided pt with flyers on support programs and [employer] [place] application for possible help. Pt shared emotions due to always being in treatment and CSW discussed coping techniques, encouraged pt to attend group. CSW will shared need for financial assistance with financial counselor Lendell Caprice so she can follow up. He agrees to reach out as needed and will review [employer] application.  Clinical Social Work interventions: Supportive Psychiatric nurse education and referral  [place] Billy Tur[place], [place] Tuesdays   [phone]

## 2016-06-02 NOTE — Patient Instructions (Addendum)
[place] at [facility] Discharge Instructions  RECOMMENDATIONS MADE BY THE CONSULTANT AND ANY TEST RESULTS WILL BE SENT TO YOUR REFERRING PHYSICIAN.  Treatment today  Continue with Imodium for loose stools as needed  Return in 2 weeks for follow up  Continue with Decadron 2 days post treatment as prescribed  Call if phlegm/cough worsens  Return in 2 weeks for follow up    Thank you for choosing [place] at [facility] to provide your oncology and hematology care.  To afford each patient quality time with our provider, please arrive at least 15 minutes before your scheduled appointment time.    If you have a lab appointment with the [facility] please come in thru the  Main Entrance and check in at the main information desk  You need to re-schedule your appointment should you arrive 10 or more minutes late.  We strive to give you quality time with our providers, and arriving late affects you and other patients whose appointments are after yours.  Also, if you no show three or more times for appointments you may be dismissed from the clinic at the providers discretion.     Again, thank you for choosing [facility].  Our hope is that these requests will decrease the amount of time that you wait before being seen by our physicians.       _____________________________________________________________  Should you have questions after your visit to [facility], please contact our office at [phone] between the hours of 8:30 a.m. and 4:30 p.m.  Voicemails left after 4:30 p.m. will not be returned until the following business day.  For prescription refill requests, have your pharmacy contact our office.       Resources For Cancer Patients and their Caregivers ? American Cancer Society: Can assist with transportation, wigs, general needs, runs Look Good Feel Better.        938-029-2088 ? Cancer  Care: Provides financial assistance, online support groups, medication/co-pay assistance.  1-800-813-HOPE 705-533-1552) ? [place] Assists [place] cancer patients and their families through emotional , educational and financial support.  325-232-9429 ? [place] DSS Where to apply for food stamps, Medicaid and utility assistance. 863-587-3779 ? RCATS: Transportation to medical appointments. [phone] ? Social Security Administration: May apply for disability if have a Stage IV cancer. [phone] ? [employer], Disability and Transit Services: Assists with nutrition, care and transit needs. [place] Support Programs: @10RELATIVEDAYS @ > Cancer Support Group  2nd Tuesday of the month 1pm-2pm, Journey Room  > Creative Journey  3rd Tuesday of the month 1130am-1pm, Journey Room  > Look Good Feel Better  1st Wednesday of the month 10am-12 noon, Journey Room (Call [place] to register [phone])

## 2016-06-02 NOTE — Patient Instructions (Signed)
[facility] Discharge Instructions for Patients Receiving Chemotherapy   Beginning January 23rd 2017 lab work for the [facility] will be done in the  Main lab at [facility] on 1st floor. If you have a lab appointment with the [facility] please come in thru the  Main Entrance and check in at the main information desk   Today you received the following chemotherapy agents:  Irinotecan and Vectibix  To help prevent nausea and vomiting after your treatment, we encourage you to take your nausea medication as prescribed.  If you develop nausea and vomiting, or diarrhea that is not controlled by your medication, call the clinic.  The clinic phone number is [phone]. Office hours are Monday-Friday 8:30am-5:00pm.  BELOW ARE SYMPTOMS THAT SHOULD BE REPORTED IMMEDIATELY:  *FEVER GREATER THAN 101.0 F  *CHILLS WITH OR WITHOUT FEVER  NAUSEA AND VOMITING THAT IS NOT CONTROLLED WITH YOUR NAUSEA MEDICATION  *UNUSUAL SHORTNESS OF BREATH  *UNUSUAL BRUISING OR BLEEDING  TENDERNESS IN MOUTH AND THROAT WITH OR WITHOUT PRESENCE OF ULCERS  *URINARY PROBLEMS  *BOWEL PROBLEMS  UNUSUAL RASH Items with * indicate a potential emergency and should be followed up as soon as possible. If you have an emergency after office hours please contact your primary care physician or go to the nearest emergency department.  Please call the clinic during office hours if you have any questions or concerns.   You may also contact the Patient Navigator at [phone] should you have any questions or need assistance in obtaining follow up care.      Resources For Cancer Patients and their Caregivers ? American Cancer Society: Can assist with transportation, wigs, general needs, runs Look Good Feel Better.        904-158-6902 ? Cancer Care: Provides financial assistance, online support groups, medication/co-pay assistance.  1-800-813-HOPE 403-348-3981) ? [place] Assists [place] cancer patients and their families through emotional , educational and financial support.  912-858-0507 ? [place] DSS Where to apply for food stamps, Medicaid and utility assistance. [phone] ? RCATS: Transportation to medical appointments. 765-403-9191 ? Social Security Administration: May apply for disability if have a Stage IV cancer. [phone] 7432284565 ? [employer], Disability and Transit Services: Assists with nutrition, care and transit needs. [phone]

## 2016-06-02 NOTE — Progress Notes (Signed)
Tolerated infusion w/o any adverse reaction.  Alert, in no distress.  Discharged ambulatory.

## 2016-06-02 NOTE — Progress Notes (Signed)
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  KRAS WT.    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 Appearance is most likely to represent diffuse hepatic metastasis      07/30/2014 Initial Diagnosis    Colon, biopsy, sigmoid - ADENOCARCINOMA.      07/31/2014 Tumor Marker    CEA- 48.0      07/31/2014 Imaging    Ct abd/pelvis- concerning for primary colorectal neoplasm in the mid to distal sigmoid colon, with lymphadenopathy in the sigmoid mesocolon, ileocolic mesentery, and retroperitoneum, as well as widespread metastatic disease to the liver      08/01/2014 Imaging    CT chest- Pathologic thoracic, right hilar, and infrahilar adenopathy associated with a masslike region of possible consolidation in the right middle lobe, surrounding nodularity, and some scattered bilateral pulmonary nodules.      08/06/2014 - 12/10/2014 Chemotherapy    FOLFOX.  Avastin added for cycle 2.  S/P 10 cycles.  Change to maintenance therapy secondary to positive response to therapy, increasing PN, and progressive proteinuria.      10/08/2014 Adverse Reaction    Avastin induced proteinuria >/= 2 gm Avastin on hold      10/15/2014 Treatment Plan Change    Avastin held for proteinuria.      10/22/2014 Imaging    Ct CAP- 1. Interval decrease and soft tissue adjacent to the mid sigmoid colon with  decreasing sigmoid colon wall thickening. The  leocolic, mesenteric, perirectal, and mesocolonic lymphadenopathy has decreased in the interval.      10/30/2014 Survivorship    Genetic Counseling. Genetic testing was normal, and did not reveal a deleterious mutation in these genes. The test report will be scanned into EPIC and will be located under the Media tab.       11/26/2014 Treatment Plan Change    Oxaliplatin reduced x 25% due to PN.      12/24/2014 Treatment Plan Change    Change to maintanence therapy.      01/09/2015 - 03/26/2015 Chemotherapy    Xeloda 2500 mg BID, 7 days on and 7 days off.      01/30/2015 Imaging    CT in ED- Evidence of distal small bowel obstruction with transition point over the ileum in the right upper pelvis immediately adjacent/abutting the known malignant stricturing of the sigmoid colon. There is wall thickening of the sigmoid colon wi...      01/30/2015 - 01/31/2015 Hospital Admission    SBO (small bowel obstruction)      03/26/2015 Treatment Plan Change    Xeloda cost-prohibitive without financial help      04/15/2015 -  Chemotherapy    5FU/Leucovorin days 1-5 every 28 days      05/13/2015 Adverse Reaction    Palmar-Plantar erythrodysesthesia      05/13/2015 Treatment Plan Change    5FU/Leucovorin held  05/28/2015 Treatment Plan Change    5FU dose reduced by 20%      07/03/2015 Imaging    CT CAP- Resolution right-sided pulmonary nodules and decrease in size of small mediastinal nodes. Response to therapy of hepatic metastasis. Similar to slight increase in suspicious nodes in the high mesorectum.      10/29/2015 Imaging    CT CAP- Further reduction in size of the hepatic metastatic tumors, compatible with interval effective therapy. 2 mm left upper lobe pulmonary nodule has been stable over the past 15 months. Likely benign, may merit surveillance.       04/14/2016 Imaging    CT CAP- 1. Enlarging metastatic lesions in the liver  with at least 1 new lesion. Appearance compatible with progressive malignancy. 2. Slight worsening of the nodularity around the proximal portion of the inferior mesenteric artery, with cicatricial reaction in density along the lower portion of the IMA exerting some traction on the adjacent mildly thickened sigmoid colon. Some of this could be postinflammatory of local tumor is difficult to exclude. 3. Several tiny pulmonary nodules are stable from 07/31/2014, and most likely benign.      04/14/2016 Progression    CT scan demonstrates enlarging hepatic lesions and 1 new hepatic lesion (at least)      05/04/2016 Treatment Plan Change    Change in therapy       05/04/2016 -  Chemotherapy    Irinotecan+Panitumumab.      05/08/2016 Imaging    MRI L-spine- 1. Lumbar spine spondylosis as described above. 2. At L4-5 there is a broad-based disc bulge with a small central disc protrusion. Bilateral lateral recess stenosis. Mild bilateral facet arthropathy. 3. No evidence of osseous malignancy.      His Panitumumab-induced rash is much improved.  He notes that it is less bothersome for him.  He reports loose stools that are well controlled with Imodium sheet provided to the patient.  His appetite is strong and weight is stable.  He reports AM cough with yellow phlegm that clears by late AM and resolves.  He denies any sore throat, fevers, chills, etc.  He is otherwise asymptomatic.  He notes that it has been ongoing x 3 days.  Review of Systems  Constitutional: Negative for chills, fever and weight loss.  HENT: Negative.   Eyes: Negative.   Respiratory: Negative.  Negative for cough.   Cardiovascular: Negative.  Negative for chest pain.  Gastrointestinal: Negative for constipation, diarrhea (loose stools 3/day), nausea and vomiting.  Genitourinary: Negative.  Negative for dysuria.  Skin: Positive for rash. Negative for itching.  Neurological: Negative.  Negative for weakness.    Endo/Heme/Allergies: Negative.   Psychiatric/Behavioral: Negative.     Past Medical History:  Diagnosis Date  . 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 COLONOSCOPY N/A 07/30/2014   Procedure: COLONOSCOPY;  Surgeon: Inda Castle, MD;  Location: [facility] ENDOSCOPY;  Service: Endoscopy;  Laterality: N/A;  . ESOPHAGOGASTRODUODENOSCOPY N/A 07/30/2014   Procedure: ESOPHAGOGASTRODUODENOSCOPY (EGD);  Surgeon: Inda Castle, MD;  Location: [facility] ENDOSCOPY;  Service: Endoscopy;  Laterality: N/A;  . OPERATIVE ULTRASOUND N/A 08/01/2014   Procedure: OPERATIVE ULTRASOUND;  Surgeon: Gayland Curry, MD;  Location: [facility] ORS;  Service: General;  Laterality: N/A;  . PORTACATH PLACEMENT Right 08/01/14  . PORTACATH PLACEMENT N/A 08/01/2014   Procedure: INSERTION RIGHT IJ PORT-A-CATH WITH ULTRASOUND AND FLUORO;  Surgeon: Gayland Curry, MD;  Location: [facility]  ORS;  Service: General;  Laterality: N/A;    Family History  Problem Relation Age of Onset  . Cancer Mother     Deceased at 28 years old from metastatic colon cancer  . Kidney failure Father   . Heart disease Father     Deceased in 40-50's  . Colon polyps Brother   . Cancer Maternal Aunt     Social History   Social History  . Marital status: Married    Spouse name: N/A  . Number of children: N/A  . Years of education: N/A   Social History Main Topics  . Smoking status: Never Smoker  . Smokeless tobacco: Former Systems developer    Types: Chew    Quit date: 06/01/1989  . Alcohol use No  . Drug use: No  . Sexual activity: Not on file   Other Topics Concern  . Not on file   Social History Narrative  . No narrative on file     PHYSICAL EXAMINATION  ECOG PERFORMANCE STATUS: 1 - Symptomatic but completely ambulatory  Vitals:   06/02/16 0838  BP: 131/75  Pulse: 74  Resp: 20  Temp: 97.6 F (36.4 C)    GENERAL:alert, no distress, well nourished, well developed, comfortable,  cooperative, obese, smiling and accompanied by his wife. SKIN: Acne rash on face and upper chest, improved.   HEAD: Normocephalic, No masses, lesions, tenderness or abnormalities EYES: normal, EOMI, Conjunctiva are pink and non-injected EARS: External ears normal OROPHARYNX:lips, buccal mucosa, and tongue normal and mucous membranes are moist  NECK: supple, no adenopathy, trachea midline LYMPH:  no palpable lymphadenopathy BREAST:not examined LUNGS: clear to auscultation and percussion HEART: regular rate & rhythm, no murmurs, no gallops, S1 normal and S2 normal ABDOMEN:abdomen soft, non-tender, obese and normal bowel sounds BACK: Back symmetric, no curvature.   EXTREMITIES:less then 2 second capillary refill, no joint deformities, effusion, or inflammation, no skin discoloration, no cyanosis  NEURO: alert & oriented x 3 with fluent speech, no focal motor/sensory deficits, gait normal   LABORATORY DATA: CBC    Component Value Date/Time   WBC 6.9 06/02/2016 0835   RBC 4.58 06/02/2016 0835   HGB 13.7 06/02/2016 0835   HCT 40.8 06/02/2016 0835   PLT 266 06/02/2016 0835   MCV 89.1 06/02/2016 0835   MCH 29.9 06/02/2016 0835   MCHC 33.6 06/02/2016 0835   RDW 14.4 06/02/2016 0835   LYMPHSABS 1.9 06/02/2016 0835   MONOABS 0.4 06/02/2016 0835   EOSABS 0.2 06/02/2016 0835   BASOSABS 0.0 06/02/2016 0835      Chemistry      Component Value Date/Time   NA 138 06/02/2016 0835   K 3.6 06/02/2016 0835   CL 105 06/02/2016 0835   CO2 25 06/02/2016 0835   BUN 13 06/02/2016 0835   CREATININE 0.69 06/02/2016 0835      Component Value Date/Time   CALCIUM 8.5 (L) 06/02/2016 0835   ALKPHOS 82 06/02/2016 0835   AST 29 06/02/2016 0835   ALT 34 06/02/2016 0835   BILITOT 0.1 (L) 06/02/2016 0835     Lab Results  Component Value Date   CEA 6.2 (H) 03/30/2016     PENDING LABS:   RADIOGRAPHIC STUDIES:  Mr Lumbar Spine W Wo Contrast  Result Date: 05/08/2016 CLINICAL DATA:  Low back  pain with left hip pain for the past couple years. No known injury. EXAM: MRI LUMBAR SPINE WITHOUT AND WITH CONTRAST TECHNIQUE: Multiplanar and multiecho pulse sequences of the lumbar spine were obtained  without and with intravenous contrast. CONTRAST:  54m MULTIHANCE GADOBENATE DIMEGLUMINE 529 MG/ML IV SOLN COMPARISON:  None. FINDINGS: Segmentation:  Standard. Alignment:  Physiologic. Vertebrae:  No fracture, evidence of discitis, or bone lesion. Conus medullaris: Extends to the T12 level and appears normal. Paraspinal and other soft tissues: No paraspinal abnormality. Disc levels: Disc spaces: Degenerative disc disease with disc height loss at L2-3, L3-4, L4-5 and L5-S1. T12-L1: Broad-based disc bulge with a small central disc protrusion. No evidence of neural foraminal stenosis. No central canal stenosis. L1-L2: Broad-based disc bulge. No evidence of neural foraminal stenosis. No central canal stenosis. L2-L3: Broad-based disc bulge with a small central disc protrusion. Shallow right foraminal disc protrusion. No evidence of neural foraminal stenosis. No central canal stenosis. L3-L4: Broad-based disc bulge eccentric towards the right. Mild bilateral facet arthropathy. No evidence of neural foraminal stenosis. No central canal stenosis. L4-L5: Broad-based disc bulge with a small central disc protrusion. Bilateral lateral recess stenosis. Mild bilateral facet arthropathy. No evidence of neural foraminal stenosis. No central canal stenosis. L5-S1: Mild broad-based disc bulge. Mild left foraminal stenosis. No right foraminal stenosis. No central canal stenosis. IMPRESSION: 1. Lumbar spine spondylosis as described above. 2. At L4-5 there is a broad-based disc bulge with a small central disc protrusion. Bilateral lateral recess stenosis. Mild bilateral facet arthropathy. 3. No evidence of osseous malignancy. 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  As a result, he was switched to Xeloda 2500 mg BID, 7 days on and 7 days as a maintenance treatment.  In October 2016, this treatment became cost-prohibitive and therefore, on 04/15/2015, he was transitioned to treatment with 5FU/Leucovorin in a days 1-5 every 28 day fashion.  Genetic Counseling is complete and is negative.  Now with progression of disease in November 2017 imaging resulting in a change of therapy to Irinotecan/Panitumumab beginning on 05/04/2016.  Oncology history is updated.  Labs today: CBC diff, CMET, Magnesium, CEA.  I personally reviewed and went over laboratory results with the patient.  The results are noted within this dictation.  Labs satisfy treatment parameters.  He will continue with Imodium PRN.  He notes significant effectiveness of this medication to the point he needs stool softeners.  He is following the Imodium sheet and constipation sheet.  He does have an AM cough that is productive, but resolves by late AM.  He is advised to call if symptoms progress or worsen.  I would have a low threshold to provide him an antibiotic.  Return in 2 weeks for next cycle of chemotherapy and follow-up.   ORDERS PLACED FOR THIS ENCOUNTER: Orders Placed This Encounter  Procedures  . CEA    MEDICATIONS PRESCRIBED THIS ENCOUNTER: No orders of the defined types were placed in this encounter.   THERAPY PLAN:  Continue therapy with Irinotecan/Panitumumab.  All questions were answered. The patient knows to call the clinic with any problems, questions or concerns. We can certainly see the patient much sooner if necessary.  Patient and plan discussed with Dr. SAncil Linseyand she is in agreement with the aforementioned.    This note is electronically signed by: KDoy Mince1/07/2016 10:15 AM

## 2016-06-03 LAB — CEA: CEA: 1.7 ng/mL (ref 0.0–4.7)

## 2016-06-04 ENCOUNTER — Encounter (HOSPITAL_COMMUNITY): Payer: Self-pay | Admitting: Emergency Medicine

## 2016-06-04 ENCOUNTER — Inpatient Hospital Stay (HOSPITAL_COMMUNITY)
Admission: EM | Admit: 2016-06-04 | Discharge: 2016-07-02 | DRG: 393 | Disposition: E | Payer: Medicaid Other | Attending: Nephrology | Admitting: Nephrology

## 2016-06-04 ENCOUNTER — Emergency Department (HOSPITAL_COMMUNITY): Payer: Medicaid Other

## 2016-06-04 DIAGNOSIS — Z87891 Personal history of nicotine dependence: Secondary | ICD-10-CM

## 2016-06-04 DIAGNOSIS — Z66 Do not resuscitate: Secondary | ICD-10-CM | POA: Diagnosis not present

## 2016-06-04 DIAGNOSIS — K631 Perforation of intestine (nontraumatic): Principal | ICD-10-CM | POA: Diagnosis present

## 2016-06-04 DIAGNOSIS — Z7189 Other specified counseling: Secondary | ICD-10-CM

## 2016-06-04 DIAGNOSIS — A419 Sepsis, unspecified organism: Secondary | ICD-10-CM | POA: Diagnosis not present

## 2016-06-04 DIAGNOSIS — R6521 Severe sepsis with septic shock: Secondary | ICD-10-CM | POA: Diagnosis not present

## 2016-06-04 DIAGNOSIS — Z800 Family history of malignant neoplasm of digestive organs: Secondary | ICD-10-CM

## 2016-06-04 DIAGNOSIS — C7802 Secondary malignant neoplasm of left lung: Secondary | ICD-10-CM | POA: Diagnosis present

## 2016-06-04 DIAGNOSIS — C7801 Secondary malignant neoplasm of right lung: Secondary | ICD-10-CM | POA: Diagnosis present

## 2016-06-04 DIAGNOSIS — C787 Secondary malignant neoplasm of liver and intrahepatic bile duct: Secondary | ICD-10-CM | POA: Diagnosis present

## 2016-06-04 DIAGNOSIS — N179 Acute kidney failure, unspecified: Secondary | ICD-10-CM | POA: Diagnosis not present

## 2016-06-04 DIAGNOSIS — Z79899 Other long term (current) drug therapy: Secondary | ICD-10-CM

## 2016-06-04 DIAGNOSIS — D500 Iron deficiency anemia secondary to blood loss (chronic): Secondary | ICD-10-CM | POA: Diagnosis present

## 2016-06-04 DIAGNOSIS — C189 Malignant neoplasm of colon, unspecified: Secondary | ICD-10-CM | POA: Diagnosis present

## 2016-06-04 DIAGNOSIS — R109 Unspecified abdominal pain: Secondary | ICD-10-CM | POA: Diagnosis present

## 2016-06-04 DIAGNOSIS — Z7401 Bed confinement status: Secondary | ICD-10-CM

## 2016-06-04 DIAGNOSIS — J9601 Acute respiratory failure with hypoxia: Secondary | ICD-10-CM

## 2016-06-04 DIAGNOSIS — Z515 Encounter for palliative care: Secondary | ICD-10-CM

## 2016-06-04 LAB — LIPASE, BLOOD: LIPASE: 20 U/L (ref 11–51)

## 2016-06-04 LAB — CBC
HCT: 44.6 % (ref 39.0–52.0)
Hemoglobin: 15 g/dL (ref 13.0–17.0)
MCH: 29.8 pg (ref 26.0–34.0)
MCHC: 33.6 g/dL (ref 30.0–36.0)
MCV: 88.7 fL (ref 78.0–100.0)
PLATELETS: 253 10*3/uL (ref 150–400)
RBC: 5.03 MIL/uL (ref 4.22–5.81)
RDW: 14.4 % (ref 11.5–15.5)
WBC: 11.5 10*3/uL — AB (ref 4.0–10.5)

## 2016-06-04 LAB — COMPREHENSIVE METABOLIC PANEL
ALBUMIN: 4.4 g/dL (ref 3.5–5.0)
ALT: 38 U/L (ref 17–63)
ANION GAP: 11 (ref 5–15)
AST: 27 U/L (ref 15–41)
Alkaline Phosphatase: 78 U/L (ref 38–126)
BUN: 25 mg/dL — ABNORMAL HIGH (ref 6–20)
CO2: 22 mmol/L (ref 22–32)
Calcium: 9 mg/dL (ref 8.9–10.3)
Chloride: 103 mmol/L (ref 101–111)
Creatinine, Ser: 0.91 mg/dL (ref 0.61–1.24)
GFR calc Af Amer: >60 mL/min (ref >60)
GFR calc non Af Amer: >60 mL/min (ref >60)
GLUCOSE: 127 mg/dL — AB (ref 65–99)
Potassium: 4.3 mmol/L (ref 3.5–5.1)
SODIUM: 136 mmol/L (ref 135–145)
TOTAL PROTEIN: 7.4 g/dL (ref 6.5–8.1)
Total Bilirubin: 0.8 mg/dL (ref 0.3–1.2)

## 2016-06-04 MED ORDER — MORPHINE SULFATE (PF) 4 MG/ML IV SOLN
4.0000 mg | Freq: Once | INTRAVENOUS | Status: AC
  Administered 2016-06-04: 4 mg via INTRAVENOUS
  Filled 2016-06-04: qty 1

## 2016-06-04 MED ORDER — IOPAMIDOL (ISOVUE-300) INJECTION 61%
100.0000 mL | Freq: Once | INTRAVENOUS | Status: AC | PRN
  Administered 2016-06-04: 100 mL via INTRAVENOUS

## 2016-06-04 NOTE — ED Provider Notes (Addendum)
[facility] DEPT Provider Note   CSN: [id number] Arrival date & time: 06/15/2016  2120  By signing my name below, I, Tom [name], attest that this documentation has been prepared under the direction and in the presence of physician practitioner, Pattricia Boss, MD. Electronically Signed: Dora [name], Scribe. 06/07/2016. 10:14 PM.  History   Chief Complaint Chief Complaint  Patient presents with  . Abdominal Pain    The history is provided by the patient. No language interpreter was used.     HPI Comments: Tom [name] is a 51 y.o. male who presents to the Emergency Department complaining of sudden onset, constant, sharp, severe, lower abdominal pain beginning around 6 PM this evening. He states the pain is not more prominent on one side of his abdomen versus the other. He states his abdominal pain was 10/10 upon arrival to the ED but has improved somewhat; he took hydrocodone at home PTA with minimal relief of his abdominal pain. He states his abdominal pain radiates into his penis and testicles. He reports some recent constipation and states his last bowel movement was 2 days ago. He reports a h/o bowel obstruction but states his current abdominal pain is different. Pt has been eating, drinking, and urinating normally lately. He notes he is currently on chemotherapy for stage IV colon cancer (diagnosed in 2016) and his last treatment was 2 days ago. No h/o blood clot. No h/o HTN or DM. NKDA. He denies nausea, vomiting, or any other associated symptoms.  Past Medical History:  Diagnosis Date  . 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 Neuropathy due to chemotherapeutic drug ([place]) 07/22/2015  . Colon cancer metastasized to liver ([place]) 07/22/2015  . Iron deficiency anemia due to chronic blood loss 07/22/2015  . SBO (small bowel obstruction) 01/30/2015  . Abdominal pain 01/30/2015  . Nausea without  vomiting 01/30/2015  . Genetic testing 11/21/2014  . 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 GI bleed 07/28/2014  . Microcytic anemia 07/28/2014  . Leukocytosis 07/28/2014  . Epidermoid cyst 07/28/2014  . Gastrointestinal hemorrhage with melena     Past Surgical History:  Procedure Laterality Date  . COLONOSCOPY N/A 07/30/2014   Procedure: COLONOSCOPY;  Surgeon: Inda Castle, MD;  Location: [facility] ENDOSCOPY;  Service: Endoscopy;  Laterality: N/A;  . ESOPHAGOGASTRODUODENOSCOPY N/A 07/30/2014   Procedure: ESOPHAGOGASTRODUODENOSCOPY (EGD);  Surgeon: Inda Castle, MD;  Location: [facility];  Service: Endoscopy;  Laterality: N/A;  . OPERATIVE ULTRASOUND N/A 08/01/2014   Procedure: OPERATIVE ULTRASOUND;  Surgeon: Gayland Curry, MD;  Location: [facility] ORS;  Service: General;  Laterality: N/A;  . PORTACATH PLACEMENT Right 08/01/14  . PORTACATH PLACEMENT N/A 08/01/2014   Procedure: INSERTION RIGHT IJ PORT-A-CATH WITH ULTRASOUND AND FLUORO;  Surgeon: Gayland Curry, MD;  Location: [facility] ORS;  Service: General;  Laterality: N/A;       Home Medications    Prior to Admission medications   Medication Sig Start Date End Date Taking? Authorizing Provider  ascorbic acid (VITAMIN C) 500 MG tablet Take 500 mg by mouth daily.    Yes Historical Provider, MD  bisacodyl (DULCOLAX) 5 MG EC tablet Take 10 mg by mouth daily as needed for moderate constipation.   Yes Historical Provider, MD  Calcium Carbonate-Vit D-Min (CALCIUM 1200) 1200-1000 MG-UNIT CHEW Chew 1 tablet by mouth daily. 05/18/16  Yes Manon Hilding Kefalas, PA-C  dexamethasone (DECADRON) 4 MG tablet  Take 2 tablets (8 mg total) by mouth daily. Start the day after chemo for 2 days. 04/27/16  Yes Patrici Ranks, MD  diphenoxylate-atropine (LOMOTIL) 2.5-0.025 MG tablet Take by mouth 4 (four) times daily as needed for diarrhea or loose stools.   Yes Historical Provider, MD  Ferrous Sulfate 27 MG TABS Take 1 tablet by mouth 2 (two) times  daily.   Yes Historical Provider, MD  magnesium oxide (MAG-OX) 400 (241.3 Mg) MG tablet Take 1 tablet (400 mg total) by mouth 2 (two) times daily. 05/18/16  Yes Baird Cancer, PA-C  Multiple Vitamin (MULTIVITAMIN WITH MINERALS) TABS tablet Take 1 tablet by mouth daily.   Yes Historical Provider, MD  potassium chloride SA (K-DUR,KLOR-CON) 20 MEQ tablet Take 2 tablets (40 mEq total) by mouth daily. 05/18/16  Yes Baird Cancer, PA-C  triamcinolone cream (KENALOG) 0.1 % Apply 1 application topically 2 (two) times daily. 09/16/15  Yes Patrici Ranks, MD  vitamin E 400 UNIT capsule Take 400 Units by mouth daily.   Yes Historical Provider, MD  clindamycin (CLEOCIN-T) 1 % lotion Apply topically 2 (two) times daily. 05/11/16   Baird Cancer, PA-C  cyclobenzaprine (FLEXERIL) 10 MG tablet Take 1 tablet (10 mg total) by mouth 3 (three) times daily as needed for muscle spasms. 03/03/16   Baird Cancer, PA-C  diazepam (VALIUM) 10 MG tablet Take 1 tablet (10 mg total) by mouth every 8 (eight) hours as needed for muscle spasms. 10/14/15   Patrici Ranks, MD  IRINOTECAN HCL IV Inject into the vein. Every 2 weeks    Historical Provider, MD  lidocaine-prilocaine (EMLA) cream Apply 1 application topically as needed. 08/19/15   Patrici Ranks, MD  loperamide (IMODIUM A-D) 2 MG tablet Take 2 at diarrhea onset , then 1 every 2hr until 12hrs with no BM. May take 2 every 4hrs at night. If diarrhea recurs repeat. 04/27/16   Patrici Ranks, MD  ondansetron (ZOFRAN) 8 MG tablet Take 1 tablet (8 mg total) by mouth every 8 (eight) hours as needed for nausea or vomiting. 05/04/16   Baird Cancer, PA-C  oxyCODONE-acetaminophen (PERCOCET/ROXICET) 5-325 MG tablet Take 1-2 tablets by mouth every 4 (four) hours as needed for moderate pain. 03/03/16   Baird Cancer, PA-C  Panitumumab (VECTIBIX IV) Inject into the vein. Every 2 weeks    Historical Provider, MD  prochlorperazine (COMPAZINE) 10 MG tablet Take 1  tablet (10 mg total) by mouth every 6 (six) hours as needed for nausea or vomiting. 05/04/16   Baird Cancer, PA-C    Family History Family History  Problem Relation Age of Onset  . Cancer Mother     Deceased at 55 years old from metastatic colon cancer  . Kidney failure Father   . Heart disease Father     Deceased in 40-50's  . Colon polyps Brother   . Cancer Maternal Aunt     Social History Social History  Substance Use Topics  . Smoking status: Never Smoker  . Smokeless tobacco: Former Systems developer    Types: Chew    Quit date: 06/01/1989  . Alcohol use No     Allergies   Other   Review of Systems Review of Systems  All other systems reviewed and are negative.    Physical Exam Updated Vital Signs BP 132/61   Pulse 74   Resp 22   Ht 5\' 7"  (1.702 m)   Wt 219 lb (99.3 kg)  SpO2 95%   BMI 34.30 kg/m   Physical Exam  Constitutional: He is oriented to person, place, and time. He appears well-developed and well-nourished.  HENT:  Head: Normocephalic and atraumatic.  Eyes: EOM are normal.  Neck: Normal range of motion.  Cardiovascular: Normal rate, regular rhythm, normal heart sounds and intact distal pulses.   Pulmonary/Chest: Effort normal and breath sounds normal. No respiratory distress.  Abdominal: Soft. He exhibits no distension. There is no tenderness.  Musculoskeletal: Normal range of motion.  Neurological: He is alert and oriented to person, place, and time.  Skin: Skin is warm and dry.  Psychiatric: He has a normal mood and affect. Judgment normal.  Nursing note and vitals reviewed.    ED Treatments / Results  Labs (all labs ordered are listed, but only abnormal results are displayed) Labs Reviewed  CBC - Abnormal; Notable for the following:       Result Value   WBC 11.5 (*)    All other components within normal limits  COMPREHENSIVE METABOLIC PANEL - Abnormal; Notable for the following:    Glucose, Bld 127 (*)    BUN 25 (*)    All other  components within normal limits  LIPASE, BLOOD  URINALYSIS, ROUTINE W REFLEX MICROSCOPIC       EKG  EKG Interpretation None       Radiology Ct Abdomen Pelvis W Contrast  Result Date: 06/05/2016 CLINICAL DATA:  Sudden onset of severe lower abdominal pain at 18:00. EXAM: CT ABDOMEN AND PELVIS WITH CONTRAST TECHNIQUE: Multidetector CT imaging of the abdomen and pelvis was performed using the standard protocol following bolus administration of intravenous contrast. CONTRAST:  170mL ISOVUE-300 IOPAMIDOL (ISOVUE-300) INJECTION 61% COMPARISON:  04/14/2016, 10/29/2015 FINDINGS: Lower chest: No acute abnormality. Hepatobiliary: Multiple hepatic metastases, many of which appear smaller than on 04/14/2016. For example the metastatic lesion adjacent to the gallbladder on image 28 series 2 measures 2.4 x 2.6 cm and previously measured 4.3 x 5.0 cm. No conclusive new lesions. Gallbladder and bile ducts are unremarkable. Pancreas: Unremarkable. No pancreatic ductal dilatation or surrounding inflammatory changes. Spleen: Normal in size without focal abnormality. Adrenals/Urinary Tract: Adrenal glands are unremarkable. Benign upper pole and midpole left renal cysts measuring up to 2.6 cm, unchanged. Kidneys are otherwise normal, without renal calculi, focal lesion, or hydronephrosis. Bladder is remarkable only for mild mass effect on the right lateral bladder dome due to the adjacent soft tissue process. Stomach/Bowel: The cicatrizing soft tissue around the distal portions of the inferior mesenteric artery is now associated with a local bowel perforation. The mid sigmoid colon and portions of the ileum are matted together and acutely inflamed. There are several bubbles of extraluminal gas in the nearby mesentery. There is at least 1 focus of distal extraluminal gas, located anteriorly in the right upper quadrant on axial image 44 series 2 and on coronal image 18 series 4. No significant obstruction. It is unclear  whether the extraluminal gas arises from the sigmoid or from the small bowel. The stomach and proximal small bowel are unremarkable. Remainder of the colon is unremarkable. Inflammatory stranding extends out from the low midline abdomen over into the right lower quadrant pericolic gutter and also over to the left pericolic gutter. Vascular/Lymphatic: Mild atherosclerotic calcification of the normal caliber aorta. 1 x 2 cm soft tissue surrounding the IMA origin, unchanged. Reproductive: Unremarkable Other: Trace fluid in the pericolic gutters. No significant ascites. Musculoskeletal: No significant skeletal lesions. IMPRESSION: 1. Acute inflammation and focal perforation of  bowel in the low midline abdomen, at the location of the previously observed cicatrizing soft tissue mass. Several bubbles of extraluminal gas are present in the nearby mesentery and there is at least 1 focus of distant gas visible in the right upper quadrant peritoneum. At the location of the inflammation/perforation, there are loops of ileum and mid sigmoid colon matted together, and it is indeterminate whether the perforation arises from the sigmoid or from the small bowel. No significant obstruction. These results were called by telephone at the time of interpretation on 06/05/2016 at 12:03 am to Dr. Pattricia Boss , who verbally acknowledged these results. 2. No other acute findings. 3. Decreased size of many of the hepatic metastases. No conclusive new metastases to the liver. Electronically Signed   By: Andreas Newport M.D.   On: 06/05/2016 00:13    Procedures Procedures (including critical care time)  DIAGNOSTIC STUDIES: Oxygen Saturation is 95% on RA, adequate by my interpretation.    COORDINATION OF CARE: 10:19 PM Discussed treatment plan with pt at bedside and pt agreed to plan.  Medications Ordered in ED Medications - No data to display   Initial Impression / Assessment and Plan / ED Course  I have reviewed the triage  vital signs and the nursing notes.  Pertinent labs & imaging results that were available during my care of the patient were reviewed by me and considered in my medical decision making (see chart for details).  Clinical Course     Received call from radiologist with patient with perforation. Zosyn ordered IV. Gen. surgery paged. Patient being given IV fluids. Discussed with Dr. Arnoldo Morale on call for general surgery. He will see the patient. He requests the hospitalist admit the patient. Care discussed with Dr. Wyvonnia Dusky and he will have the hospitalist admit.  Vitals:   06/07/2016 2233 06/20/2016 2316  BP: 124/73 127/89  Pulse: 67 65  Resp: 18 16  Temp: 97.7 F (36.5 C)      Final Clinical Impressions(s) / ED Diagnoses   Final diagnoses:  Bowel perforation (HCC)  Malignant neoplasm of colon, unspecified part of colon [facility])    New Prescriptions New Prescriptions   No medications on file   I personally performed the services described in this documentation, which was scribed in my presence. The recorded information has been reviewed and considered.    Pattricia Boss, MD 06/05/16 OR:[id number]    Pattricia Boss, MD 06/05/16 [phone]

## 2016-06-04 NOTE — ED Triage Notes (Signed)
Pt c/o lower abd pain that started tonight. Pt states he has not had bm in 2 days. Pt's last chemo treatment was Tuesday.

## 2016-06-05 ENCOUNTER — Encounter (HOSPITAL_COMMUNITY): Payer: Self-pay

## 2016-06-05 ENCOUNTER — Telehealth (HOSPITAL_COMMUNITY): Payer: Self-pay | Admitting: *Deleted

## 2016-06-05 ENCOUNTER — Other Ambulatory Visit: Payer: Self-pay

## 2016-06-05 DIAGNOSIS — K631 Perforation of intestine (nontraumatic): Secondary | ICD-10-CM | POA: Diagnosis present

## 2016-06-05 DIAGNOSIS — C7802 Secondary malignant neoplasm of left lung: Secondary | ICD-10-CM | POA: Diagnosis present

## 2016-06-05 DIAGNOSIS — Z66 Do not resuscitate: Secondary | ICD-10-CM | POA: Diagnosis not present

## 2016-06-05 DIAGNOSIS — Z800 Family history of malignant neoplasm of digestive organs: Secondary | ICD-10-CM | POA: Diagnosis not present

## 2016-06-05 DIAGNOSIS — C187 Malignant neoplasm of sigmoid colon: Secondary | ICD-10-CM | POA: Diagnosis not present

## 2016-06-05 DIAGNOSIS — C787 Secondary malignant neoplasm of liver and intrahepatic bile duct: Secondary | ICD-10-CM

## 2016-06-05 DIAGNOSIS — R1030 Lower abdominal pain, unspecified: Secondary | ICD-10-CM | POA: Diagnosis present

## 2016-06-05 DIAGNOSIS — A419 Sepsis, unspecified organism: Secondary | ICD-10-CM | POA: Diagnosis not present

## 2016-06-05 DIAGNOSIS — Z79899 Other long term (current) drug therapy: Secondary | ICD-10-CM | POA: Diagnosis not present

## 2016-06-05 DIAGNOSIS — C189 Malignant neoplasm of colon, unspecified: Secondary | ICD-10-CM | POA: Diagnosis not present

## 2016-06-05 DIAGNOSIS — Z7189 Other specified counseling: Secondary | ICD-10-CM | POA: Diagnosis not present

## 2016-06-05 DIAGNOSIS — C7801 Secondary malignant neoplasm of right lung: Secondary | ICD-10-CM | POA: Diagnosis present

## 2016-06-05 DIAGNOSIS — Z515 Encounter for palliative care: Secondary | ICD-10-CM | POA: Diagnosis not present

## 2016-06-05 DIAGNOSIS — Z87891 Personal history of nicotine dependence: Secondary | ICD-10-CM | POA: Diagnosis not present

## 2016-06-05 DIAGNOSIS — R6521 Severe sepsis with septic shock: Secondary | ICD-10-CM | POA: Diagnosis not present

## 2016-06-05 DIAGNOSIS — R109 Unspecified abdominal pain: Secondary | ICD-10-CM

## 2016-06-05 DIAGNOSIS — Z7401 Bed confinement status: Secondary | ICD-10-CM | POA: Diagnosis not present

## 2016-06-05 DIAGNOSIS — D500 Iron deficiency anemia secondary to blood loss (chronic): Secondary | ICD-10-CM | POA: Diagnosis present

## 2016-06-05 DIAGNOSIS — J9601 Acute respiratory failure with hypoxia: Secondary | ICD-10-CM | POA: Diagnosis not present

## 2016-06-05 DIAGNOSIS — N179 Acute kidney failure, unspecified: Secondary | ICD-10-CM | POA: Diagnosis not present

## 2016-06-05 DIAGNOSIS — I959 Hypotension, unspecified: Secondary | ICD-10-CM

## 2016-06-05 LAB — URINALYSIS, ROUTINE W REFLEX MICROSCOPIC
Bilirubin Urine: NEGATIVE
GLUCOSE, UA: NEGATIVE mg/dL
HGB URINE DIPSTICK: NEGATIVE
Ketones, ur: NEGATIVE mg/dL
Leukocytes, UA: NEGATIVE
Nitrite: NEGATIVE
PH: 5 (ref 5.0–8.0)
PROTEIN: NEGATIVE mg/dL
Specific Gravity, Urine: 1.025 (ref 1.005–1.030)

## 2016-06-05 LAB — MRSA PCR SCREENING: MRSA by PCR: NEGATIVE

## 2016-06-05 MED ORDER — HYDROMORPHONE HCL 1 MG/ML IJ SOLN
2.0000 mg | INTRAMUSCULAR | Status: DC | PRN
  Administered 2016-06-05 – 2016-06-06 (×5): 2 mg via INTRAVENOUS
  Filled 2016-06-05 (×5): qty 2

## 2016-06-05 MED ORDER — HYDROMORPHONE HCL 1 MG/ML IJ SOLN
1.0000 mg | INTRAMUSCULAR | Status: DC | PRN
  Administered 2016-06-05: 1 mg via INTRAVENOUS
  Filled 2016-06-05: qty 1

## 2016-06-05 MED ORDER — DIAZEPAM 5 MG PO TABS: 10.0000 mg | ORAL_TABLET | Freq: Three times a day (TID) | ORAL | Status: DC | PRN

## 2016-06-05 MED ORDER — ONDANSETRON HCL 4 MG/2ML IJ SOLN
4.0000 mg | Freq: Four times a day (QID) | INTRAMUSCULAR | Status: DC | PRN
  Administered 2016-06-05: 4 mg via INTRAVENOUS
  Filled 2016-06-05: qty 2

## 2016-06-05 MED ORDER — DEXTROSE-NACL 5-0.9 % IV SOLN
INTRAVENOUS | Status: DC
  Administered 2016-06-05: 02:00:00 via INTRAVENOUS

## 2016-06-05 MED ORDER — PHENYLEPHRINE HCL 10 MG/ML IJ SOLN
INTRAMUSCULAR | Status: AC
  Filled 2016-06-05: qty 1

## 2016-06-05 MED ORDER — PIPERACILLIN-TAZOBACTAM 3.375 G IVPB
3.3750 g | Freq: Three times a day (TID) | INTRAVENOUS | Status: DC
  Administered 2016-06-05 – 2016-06-06 (×4): 3.375 g via INTRAVENOUS
  Filled 2016-06-05 (×12): qty 50

## 2016-06-05 MED ORDER — ONDANSETRON HCL 4 MG PO TABS: 4.0000 mg | ORAL_TABLET | Freq: Four times a day (QID) | ORAL | Status: DC | PRN

## 2016-06-05 MED ORDER — PHENYLEPHRINE HCL 10 MG/ML IJ SOLN
INTRAMUSCULAR | Status: AC
  Filled 2016-06-05: qty 4

## 2016-06-05 MED ORDER — PHENYLEPHRINE HCL 10 MG/ML IJ SOLN
0.0000 ug/min | INTRAVENOUS | Status: DC
  Administered 2016-06-05: 110 ug/min via INTRAVENOUS
  Filled 2016-06-05: qty 4

## 2016-06-05 MED ORDER — SODIUM CHLORIDE 0.9 % IV SOLN
INTRAVENOUS | Status: DC
  Administered 2016-06-05 – 2016-06-06 (×3): via INTRAVENOUS

## 2016-06-05 MED ORDER — PIPERACILLIN-TAZOBACTAM 3.375 G IVPB 30 MIN
3.3750 g | Freq: Once | INTRAVENOUS | Status: AC
  Administered 2016-06-05: 3.375 g via INTRAVENOUS
  Filled 2016-06-05: qty 50

## 2016-06-05 MED ORDER — HYDROMORPHONE HCL 1 MG/ML IJ SOLN
1.0000 mg | INTRAMUSCULAR | Status: DC | PRN
  Administered 2016-06-05 (×3): 1 mg via INTRAVENOUS
  Filled 2016-06-05 (×3): qty 1

## 2016-06-05 MED ORDER — PHENYLEPHRINE HCL 10 MG/ML IJ SOLN
0.0000 ug/min | INTRAVENOUS | Status: DC
  Administered 2016-06-05: 20 ug/min via INTRAVENOUS
  Administered 2016-06-05: 90 ug/min via INTRAVENOUS
  Administered 2016-06-05: 70 ug/min via INTRAVENOUS
  Filled 2016-06-05: qty 1

## 2016-06-05 MED ORDER — HYDROMORPHONE HCL 1 MG/ML IJ SOLN
1.0000 mg | Freq: Once | INTRAMUSCULAR | Status: AC
  Administered 2016-06-05: 1 mg via INTRAVENOUS
  Filled 2016-06-05: qty 1

## 2016-06-05 MED ORDER — SODIUM CHLORIDE 0.9% FLUSH
3.0000 mL | Freq: Two times a day (BID) | INTRAVENOUS | Status: DC
  Administered 2016-06-05: 3 mL via INTRAVENOUS

## 2016-06-05 MED ORDER — HEPARIN SODIUM (PORCINE) 5000 UNIT/ML IJ SOLN
5000.0000 [IU] | Freq: Three times a day (TID) | INTRAMUSCULAR | Status: DC
  Administered 2016-06-05 – 2016-06-06 (×3): 5000 [IU] via SUBCUTANEOUS
  Filled 2016-06-05 (×4): qty 1

## 2016-06-05 MED ORDER — LORAZEPAM 2 MG/ML IJ SOLN
1.0000 mg | INTRAMUSCULAR | Status: DC | PRN
  Administered 2016-06-05 – 2016-06-06 (×2): 1 mg via INTRAVENOUS
  Filled 2016-06-05 (×2): qty 1

## 2016-06-05 MED ORDER — SODIUM CHLORIDE 0.9 % IV BOLUS (SEPSIS)
1000.0000 mL | Freq: Once | INTRAVENOUS | Status: AC
  Administered 2016-06-05: 1000 mL via INTRAVENOUS

## 2016-06-05 NOTE — Progress Notes (Signed)
Received a call from the patient's nurse regarding systolic blood pressure of 70s.  Patient was evaluated at bedside. He is receiving frequent IV Dilaudid for the pain management. CT scan of abdomen earlier today consistent with perforated bowel. He has is stage IV colon cancer with metastasis. -Ordered normal saline 1 L bolus. Blood pressure improved to [id number] systolic with tachycardia. -Patient was alert awake and reported abdomen pain is better with the pain medications.  -I discussed the findings with Dr. Arnoldo Morale from general surgery. Patient is probably a poor candidate for surgical intervention. After discussion with the patient he clearly declined any surgical intervention. His wife agreed with the plan. I discussed goals of care with the patient and his wife at bedside. They want to focus on pain control and quality of life. Agreed with DO NOT RESUSCITATE. Order placed.  -We will transfer patient to ICU for close monitoring -Apparently patient has a Port-A-Cath for IV access.  -I will order phenylephrine IV for hypotension -Palliative care consulted and discussed with them in detail. Patient with deteriorating clinical conditions and poor prognosis. -I will add Flagyl for intra-abdominal infection.  We will continue to monitor closely. Patient has poor prognosis. Discussed extensively with the patient's wife and sister-in-law who is a nursing home staff in this hospital.

## 2016-06-05 NOTE — Progress Notes (Signed)
In to pt room for PICC line/vascular wellness.  Pt has Port A Cath, no need at this time for PICC.  Dr Carolin Sicks notified via Shea [facility].

## 2016-06-05 NOTE — ED Notes (Signed)
Bladder scan showed 225ml in his bladder

## 2016-06-05 NOTE — Telephone Encounter (Signed)
Patient called into call center last night and was advised to go to the emergency room.  Attempted to call patients home this morning with no answer.  Realized that patient was admitted to hospital last night.

## 2016-06-05 NOTE — Care Management Note (Signed)
Case Management Note  Patient Details  Name: Tom [name] MRN: [id number] Date of Birth: 24-Jan-1966  Subjective/Objective:                  Pt admitted with bowel perforation. He is from home. Significant other at bedside. Pt has received pain meds and info provided by spouse at bedside. Pt listed as having no insurance. SI believed pt could have medicaid but does not have a card. FC has been referred and asked to determine medicaid status. Per SI pt ind with ADL's PTA. His PCP is Dr. Karie Kirks but he is seen by Dr. Whitney Muse. He is currently undergoing chemo.   Action/Plan: CM will cont to follow for DC planning.   Expected Discharge Date:     06/10/2015             Expected Discharge Plan:  Home/Self Care  In-House Referral:  Financial Counselor  Discharge planning Services  CM Consult  Post Acute Care Choice:  NA Choice offered to:  NA  Status of Service:  In process, will continue to follow   Sherald Barge, RN 06/05/2016, 12:34 PM

## 2016-06-05 NOTE — Progress Notes (Signed)
Updated on situation by Dr. Carolin Sicks.  I did talk with Oncology and made them aware of the situation.  It was felt that he was a poor surgical candidate and he has now been made DNR.  I will continue to follow.

## 2016-06-05 NOTE — Consult Note (Signed)
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 KRAS WT.  HISTORY OF PRESENT ILLNESS:  Tom [name] 51 y.o. 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 KRAS WT.    Adenocarcinoma of colon metastatic to liver ([place])   07/20/2014 Miscellaneous    KRAS WT      07/28/2014 - 08/01/2014 Hospital Admission    Presenting with severe iron deficiency anemia      07/29/2014 Imaging    [name] abd- Multiple heterogeneous mass lesions throughout the liver, some with central cavitation. Appearance is most likely to represent diffuse hepatic metastasis      07/30/2014 Initial Diagnosis    Colon, biopsy, sigmoid - ADENOCARCINOMA.      07/31/2014 Tumor Marker    CEA- 48.0      07/31/2014 Imaging    Ct abd/pelvis- concerning for primary colorectal neoplasm in the mid to distal sigmoid colon, with lymphadenopathy in the sigmoid mesocolon, ileocolic mesentery, and retroperitoneum, as well as widespread metastatic disease to the liver      08/01/2014 Imaging    CT chest- Pathologic thoracic, right hilar, and infrahilar adenopathy associated with a masslike region of possible consolidation in the right middle lobe, surrounding nodularity, and some scattered bilateral pulmonary nodules.      08/06/2014 - 12/10/2014 Chemotherapy    FOLFOX.  Avastin added for cycle 2.  S/P 10 cycles.  Change to maintenance therapy secondary to positive response to therapy, increasing PN, and progressive proteinuria.      10/08/2014 Adverse Reaction    Avastin induced proteinuria >/= 2 gm Avastin on  hold      10/15/2014 Treatment Plan Change    Avastin held for proteinuria.      10/22/2014 Imaging    Ct CAP- 1. Interval decrease and soft tissue adjacent to the mid sigmoid colon with decreasing sigmoid colon wall thickening. The  leocolic, mesenteric, perirectal, and mesocolonic lymphadenopathy has decreased in the interval.      10/30/2014 Survivorship    Genetic Counseling. Genetic testing was normal, and did not reveal a deleterious mutation in these genes. The test report will be scanned into EPIC and will be located under the Media tab.       11/26/2014 Treatment Plan Change    Oxaliplatin reduced x 25% due to PN.      12/24/2014 Treatment Plan Change    Change to maintanence therapy.      01/09/2015 - 03/26/2015 Chemotherapy    Xeloda 2500 mg BID, 7 days on and 7 days off.      01/30/2015 Imaging    CT in ED- Evidence of distal small bowel obstruction with transition point over the ileum in the right upper pelvis immediately adjacent/abutting the known malignant stricturing of the sigmoid colon. There is wall thickening of the sigmoid colon wi...      01/30/2015 - 01/31/2015 Hospital Admission    SBO (small bowel obstruction)      03/26/2015 Treatment Plan Change    Xeloda cost-prohibitive without financial help      04/15/2015 -  Chemotherapy    5FU/Leucovorin days 1-5 every 28 days  05/13/2015 Adverse Reaction    Palmar-Plantar erythrodysesthesia      05/13/2015 Treatment Plan Change    5FU/Leucovorin held      05/28/2015 Treatment Plan Change    5FU dose reduced by 20%      07/03/2015 Imaging    CT CAP- Resolution right-sided pulmonary nodules and decrease in size of small mediastinal nodes. Response to therapy of hepatic metastasis. Similar to slight increase in suspicious nodes in the high mesorectum.      10/29/2015 Imaging    CT CAP- Further reduction in size of the hepatic metastatic tumors, compatible with interval effective therapy. 2 mm left  upper lobe pulmonary nodule has been stable over the past 15 months. Likely benign, may merit surveillance.       04/14/2016 Imaging    CT CAP- 1. Enlarging metastatic lesions in the liver with at least 1 new lesion. Appearance compatible with progressive malignancy. 2. Slight worsening of the nodularity around the proximal portion of the inferior mesenteric artery, with cicatricial reaction in density along the lower portion of the IMA exerting some traction on the adjacent mildly thickened sigmoid colon. Some of this could be postinflammatory of local tumor is difficult to exclude. 3. Several tiny pulmonary nodules are stable from 07/31/2014, and most likely benign.      04/14/2016 Progression    CT scan demonstrates enlarging hepatic lesions and 1 new hepatic lesion (at least)      05/04/2016 Treatment Plan Change    Change in therapy       05/04/2016 -  Chemotherapy    Irinotecan+Panitumumab.      05/08/2016 Imaging    MRI L-spine- 1. Lumbar spine spondylosis as described above. 2. At L4-5 there is a broad-based disc bulge with a small central disc protrusion. Bilateral lateral recess stenosis. Mild bilateral facet arthropathy. 3. No evidence of osseous malignancy.      06/12/2016 Imaging    CT abd/pelvis- 1. Acute inflammation and focal perforation of bowel in the low midline abdomen, at the location of the previously observed cicatrizing soft tissue mass. Several bubbles of extraluminal gas are present in the nearby mesentery and there is at least 1 focus of distant gas visible in the right upper quadrant peritoneum. At the location of the inflammation/perforation, there are loops of ileum and mid sigmoid colon matted together, and it is indeterminate whether the perforation arises from the sigmoid or from the small bowel. No significant obstruction. These results were called by telephone at the time of interpretation on 06/05/2016 at 12:03 am to Dr. Pattricia Boss , who  verbally acknowledged these results. 2. No other acute findings. 3. Decreased size of many of the hepatic metastases. No conclusive new metastases to the liver.      06/03/2016 -  Hospital Admission    Admit date: 06/02/2016 Admission diagnosis: contained perforation of bowel. Additional comments: felt to be a poor surgical candidate.  Managed conservatively.      According to admitting hospitalist: "presented to the ER with lower abdominal pain started today.  He has not had BM for a few days.  He did not have fever, chills, nausea or vomiting.  Work up in the ER with CT abd/pelvic showed though the tumors appears smaller, there is free air, suggestive of bowel perforation in either the small bowel or the sigmoid colon.  See full report.  Serology showed normal WBC, and renal Fx tests.  His EKG was unremarkable."  He is in bed.  He is  surrounded by family members.  He is fatigued/lethargic from pain medication.  All questions are answered for the family and patient.  PAST MEDICAL HISTORY:   Past Medical History:  Diagnosis Date  . Adenocarcinoma of colon metastatic to liver ([place]) 07/30/2014    ALLERGIES: Allergies  Allergen Reactions  . Other Itching    Injection for pain given here at ED caused itching      MEDICATIONS: I have reviewed the patient's current medications.     PAST SURGICAL HISTORY Past Surgical History:  Procedure Laterality Date  . COLONOSCOPY N/A 07/30/2014   Procedure: COLONOSCOPY;  Surgeon: Inda Castle, MD;  Location: [facility];  Service: Endoscopy;  Laterality: N/A;  . ESOPHAGOGASTRODUODENOSCOPY N/A 07/30/2014   Procedure: ESOPHAGOGASTRODUODENOSCOPY (EGD);  Surgeon: Inda Castle, MD;  Location: [facility];  Service: Endoscopy;  Laterality: N/A;  . OPERATIVE ULTRASOUND N/A 08/01/2014   Procedure: OPERATIVE ULTRASOUND;  Surgeon: Gayland Curry, MD;  Location: [facility];  Service: General;  Laterality: N/A;  . PORTACATH PLACEMENT Right 08/01/14  . PORTACATH  PLACEMENT N/A 08/01/2014   Procedure: INSERTION RIGHT IJ PORT-A-CATH WITH ULTRASOUND AND FLUORO;  Surgeon: Gayland Curry, MD;  Location: [facility];  Service: General;  Laterality: N/A;    FAMILY HISTORY: Family History  Problem Relation Age of Onset  . Cancer Mother     Deceased at 54 years old from metastatic colon cancer  . Kidney failure Father   . Heart disease Father     Deceased in 40-50's  . Colon polyps Brother   . Cancer Maternal Aunt     SOCIAL HISTORY:  reports that he has never smoked. He quit smokeless tobacco use about 27 years ago. His smokeless tobacco use included Chew. He reports that he does not drink alcohol or use drugs.  PERFORMANCE STATUS: The patient's performance status is 4 - Bedbound  PHYSICAL EXAM: Most Recent Vital Signs: Blood pressure (!) 82/66, pulse (!) 125, temperature 99.3 F (37.4 C), temperature source Axillary, resp. rate (!) 25, height _0  (1.702 m), weight 215 lb 4.8 oz (97.7 kg), SpO2 91 %. General appearance: alert, cooperative, mild distress, slowed mentation and fatigued/lethargic, surrounded by many family members. Head: Normocephalic, without obvious abnormality, atraumatic Eyes: negative Neck: supple, symmetrical, trachea midline Neurologic: Grossly normal  LABORATORY DATA:  Results for orders placed or performed during the hospital encounter of 06/27/2016 (from the past 48 hour(s))  CBC     Status: Abnormal   Collection Time: 06/21/2016 10:23 PM  Result Value Ref Range   WBC 11.5 (H) 4.0 - 10.5 K/uL   RBC 5.03 4.22 - 5.81 MIL/uL   Hemoglobin 15.0 13.0 - 17.0 g/dL   HCT 44.6 39.0 - 52.0 %   MCV 88.7 78.0 - 100.0 fL   MCH 29.8 26.0 - 34.0 pg   MCHC 33.6 30.0 - 36.0 g/dL   RDW 14.4 11.5 - 15.5 %   Platelets 253 150 - 400 K/uL  Lipase, blood     Status: None   Collection Time: 06/14/2016 10:23 PM  Result Value Ref Range   Lipase 20 11 - 51 U/L  Comprehensive metabolic panel     Status: Abnormal   Collection Time: 06/18/2016 10:23 PM   Result Value Ref Range   Sodium 136 135 - 145 mmol/L   Potassium 4.3 3.5 - 5.1 mmol/L   Chloride 103 101 - 111 mmol/L   CO2 22 22 - 32 mmol/L   Glucose, Bld 127 (H) 65 - 99  mg/dL   BUN 25 (H) 6 - 20 mg/dL   Creatinine, Ser 0.91 0.61 - 1.24 mg/dL   Calcium 9.0 8.9 - 10.3 mg/dL   Total Protein 7.4 6.5 - 8.1 g/dL   Albumin 4.4 3.5 - 5.0 g/dL   AST 27 15 - 41 U/L   ALT 38 17 - 63 U/L   Alkaline Phosphatase 78 38 - 126 U/L   Total Bilirubin 0.8 0.3 - 1.2 mg/dL   GFR calc non Af Amer >60 >60 mL/min   GFR calc Af Amer >60 >60 mL/min    Comment: (NOTE) The eGFR has been calculated using the CKD EPI equation. This calculation has not been validated in all clinical situations. eGFR's persistently <60 mL/min signify possible Chronic Kidney Disease.    Anion gap 11 5 - 15  Urinalysis, Routine w reflex microscopic     Status: None   Collection Time: 06/05/16  4:15 AM  Result Value Ref Range   Color, Urine YELLOW YELLOW   APPearance CLEAR CLEAR   Specific Gravity, Urine 1.025 1.005 - 1.030   pH 5.0 5.0 - 8.0   Glucose, UA NEGATIVE NEGATIVE mg/dL   Hgb urine dipstick NEGATIVE NEGATIVE   Bilirubin Urine NEGATIVE NEGATIVE   Ketones, ur NEGATIVE NEGATIVE mg/dL   Protein, ur NEGATIVE NEGATIVE mg/dL   Nitrite NEGATIVE NEGATIVE   Leukocytes, UA NEGATIVE NEGATIVE    Comment: Microscopic not done on urines with negative protein, blood, leukocytes, nitrite, or glucose < 500 mg/dL.      RADIOGRAPHY: Ct Abdomen Pelvis W Contrast  Result Date: 06/05/2016 CLINICAL DATA:  Sudden onset of severe lower abdominal pain at 18:00. EXAM: CT ABDOMEN AND PELVIS WITH CONTRAST TECHNIQUE: Multidetector CT imaging of the abdomen and pelvis was performed using the standard protocol following bolus administration of intravenous contrast. CONTRAST:  132m ISOVUE-300 IOPAMIDOL (ISOVUE-300) INJECTION 61% COMPARISON:  04/14/2016, 10/29/2015 FINDINGS: Lower chest: No acute abnormality. Hepatobiliary: Multiple hepatic  metastases, many of which appear smaller than on 04/14/2016. For example the metastatic lesion adjacent to the gallbladder on image 28 series 2 measures 2.4 x 2.6 cm and previously measured 4.3 x 5.0 cm. No conclusive new lesions. Gallbladder and bile ducts are unremarkable. Pancreas: Unremarkable. No pancreatic ductal dilatation or surrounding inflammatory changes. Spleen: Normal in size without focal abnormality. Adrenals/Urinary Tract: Adrenal glands are unremarkable. Benign upper pole and midpole left renal cysts measuring up to 2.6 cm, unchanged. Kidneys are otherwise normal, without renal calculi, focal lesion, or hydronephrosis. Bladder is remarkable only for mild mass effect on the right lateral bladder dome due to the adjacent soft tissue process. Stomach/Bowel: The cicatrizing soft tissue around the distal portions of the inferior mesenteric artery is now associated with a local bowel perforation. The mid sigmoid colon and portions of the ileum are matted together and acutely inflamed. There are several bubbles of extraluminal gas in the nearby mesentery. There is at least 1 focus of distal extraluminal gas, located anteriorly in the right upper quadrant on axial image 44 series 2 and on coronal image 18 series 4. No significant obstruction. It is unclear whether the extraluminal gas arises from the sigmoid or from the small bowel. The stomach and proximal small bowel are unremarkable. Remainder of the colon is unremarkable. Inflammatory stranding extends out from the low midline abdomen over into the right lower quadrant pericolic gutter and also over to the left pericolic gutter. Vascular/Lymphatic: Mild atherosclerotic calcification of the normal caliber aorta. 1 x 2 cm soft tissue surrounding  the IMA origin, unchanged. Reproductive: Unremarkable Other: Trace fluid in the pericolic gutters. No significant ascites. Musculoskeletal: No significant skeletal lesions. IMPRESSION: 1. Acute inflammation and  focal perforation of bowel in the low midline abdomen, at the location of the previously observed cicatrizing soft tissue mass. Several bubbles of extraluminal gas are present in the nearby mesentery and there is at least 1 focus of distant gas visible in the right upper quadrant peritoneum. At the location of the inflammation/perforation, there are loops of ileum and mid sigmoid colon matted together, and it is indeterminate whether the perforation arises from the sigmoid or from the small bowel. No significant obstruction. These results were called by telephone at the time of interpretation on 06/05/2016 at 12:03 am to Dr. Pattricia Boss , who verbally acknowledged these results. 2. No other acute findings. 3. Decreased size of many of the hepatic metastases. No conclusive new metastases to the liver. Electronically Signed   By: Andreas Newport M.D.   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 KRAS WT.  Currently on Irinotecan/Panitumumab Day 4 Cycle 3.  Admitted for contained perforation of bowel.  Gen Surg consulted and Dr. Arnoldo Morale has seen the patient.  Case has been discussed with him earlier today.  It is recommended to manage this issue conservatively with ongoing IV antibiotics as we would not expect a good recovery from such an intensive surgery.  Hypotension- in ICU on phenylephrine infusion  Abdominal pain- on Hydromorphone IV  DNR  I personally reviewed and went over radiographic studies with the patient.  The results are noted within this dictation.    I personally reviewed and went over laboratory results with the patient.  The results are noted within this dictation.  Seen by palliative care.  We appreciate Palliative Care's involvement in this nice man's care.  Oncology will follow from the periphery.  We will hold systemic chemotherapy if there is  recovery from this acute issue.  If oncology input is needed during this hospital stay, please do not hesitate to call.  Patient and plan discussed with Dr. Ancil Linsey and she is in agreement with the aforementioned.   Doy Mince 06/05/2016 5:29 PM   Patient is well known to [name] since early 2016 and has done exceptionally well with therapy. Unfortunately presents with contained perforation of bowel. Agree with current management. Agree with DNR. Patient and family are very realistic. Appreciate palliative care input. Patient is comfortable, continue hydromorphone. Donald Pore MD

## 2016-06-05 NOTE — Progress Notes (Signed)
Verbal order from Dr Carolin Sicks for PICC line placement.  [facility] called at this time.

## 2016-06-05 NOTE — H&P (Signed)
History and Physical    Tom [name] [id number] DOB: July 19, 1965 DOA: 06/25/2016  PCP: [name], MD  Patient coming from: Home.    Chief Complaint:   Abdominal pain x 1 day.   HPI: Tom [name] is an 51 y.o. male with hx of colon adenocarcinoma with liver mets, currently under chemotherapy, presented to the ER with lower abdominal pain started today.  He has not had BM for a few days.  He did not have fever, chills, nausea or vomiting.  Work up in the ER with CT abd/pelvic showed though the tumors appears smaller, there is free air, suggestive of bowel perforation in either the small bowel or the sigmoid colon.  See full report.  Serology showed normal WBC, and renal Fx tests.  His EKG was unremarkable.  Dr Arnoldo Morale was consulted and will see patient in the am.  Hospitalist was asked to admit him for further Tx.  He was given IV Zosyn in the ER.     ED Course:  See above.  Rewiew of Systems:  Constitutional: Negative for malaise, fever and chills. No significant weight loss or weight gain Eyes: Negative for eye pain, redness and discharge, diplopia, visual changes, or flashes of light. ENMT: Negative for ear pain, hoarseness, nasal congestion, sinus pressure and sore throat. No headaches; tinnitus, drooling, or problem swallowing. Cardiovascular: Negative for chest pain, palpitations, diaphoresis, dyspnea and peripheral edema. ; No orthopnea, PND Respiratory: Negative for cough, hemoptysis, wheezing and stridor. No pleuritic chestpain. Gastrointestinal: Negative for diarrhea, constipation,  melena, blood in stool, hematemesis, jaundice and rectal bleeding.    Genitourinary: Negative for frequency, dysuria, incontinence,flank pain and hematuria; Musculoskeletal: Negative for back pain and neck pain. Negative for swelling and trauma.;  Skin: . Negative for pruritus, rash, abrasions, bruising and skin lesion.; ulcerations Neuro: Negative for headache, lightheadedness and  neck stiffness. Negative for weakness, altered level of consciousness , altered mental status, extremity weakness, burning feet, involuntary movement, seizure and syncope.  Psych: negative for anxiety, depression, insomnia, tearfulness, panic attacks, hallucinations, paranoia, suicidal or homicidal ideation    Past Medical History:  Diagnosis Date  . 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 COLONOSCOPY N/A 07/30/2014   Procedure: COLONOSCOPY;  Surgeon: Inda Castle, MD;  Location: [facility] ENDOSCOPY;  Service: Endoscopy;  Laterality: N/A;  . ESOPHAGOGASTRODUODENOSCOPY N/A 07/30/2014   Procedure: ESOPHAGOGASTRODUODENOSCOPY (EGD);  Surgeon: Inda Castle, MD;  Location: [facility];  Service: Endoscopy;  Laterality: N/A;  . OPERATIVE ULTRASOUND N/A 08/01/2014   Procedure: OPERATIVE ULTRASOUND;  Surgeon: Gayland Curry, MD;  Location: [facility] ORS;  Service: General;  Laterality: N/A;  . PORTACATH PLACEMENT Right 08/01/14  . PORTACATH PLACEMENT N/A 08/01/2014   Procedure: INSERTION RIGHT IJ PORT-A-CATH WITH ULTRASOUND AND FLUORO;  Surgeon: Gayland Curry, MD;  Location: [facility] ORS;  Service: General;  Laterality: N/A;     reports that he has never smoked. He quit smokeless tobacco use about 27 years ago. His smokeless tobacco use included Chew. He reports that he does not drink alcohol or use drugs.  Allergies  Allergen Reactions  . Other Itching    Injection for pain given here at ED caused itching    Family History  Problem Relation Age of Onset  . Cancer Mother     Deceased at 14 years old from metastatic colon cancer  . Kidney failure Father   . Heart disease Father  Deceased in 40-50's  . Colon polyps Brother   . Cancer Maternal Aunt      Prior to Admission medications   Medication Sig Start Date End Date Taking? Authorizing Provider  ascorbic acid (VITAMIN C) 500 MG tablet Take 500 mg by mouth daily.    Yes  Historical Provider, MD  bisacodyl (DULCOLAX) 5 MG EC tablet Take 10 mg by mouth daily as needed for moderate constipation.   Yes Historical Provider, MD  Calcium Carbonate-Vit D-Min (CALCIUM 1200) 1200-1000 MG-UNIT CHEW Chew 1 tablet by mouth daily. 05/18/16  Yes Manon Hilding Kefalas, PA-C  dexamethasone (DECADRON) 4 MG tablet Take 2 tablets (8 mg total) by mouth daily. Start the day after chemo for 2 days. 04/27/16  Yes Patrici Ranks, MD  diphenoxylate-atropine (LOMOTIL) 2.5-0.025 MG tablet Take by mouth 4 (four) times daily as needed for diarrhea or loose stools.   Yes Historical Provider, MD  Ferrous Sulfate 27 MG TABS Take 1 tablet by mouth 2 (two) times daily.   Yes Historical Provider, MD  magnesium oxide (MAG-OX) 400 (241.3 Mg) MG tablet Take 1 tablet (400 mg total) by mouth 2 (two) times daily. 05/18/16  Yes Baird Cancer, PA-C  Multiple Vitamin (MULTIVITAMIN WITH MINERALS) TABS tablet Take 1 tablet by mouth daily.   Yes Historical Provider, MD  potassium chloride SA (K-DUR,KLOR-CON) 20 MEQ tablet Take 2 tablets (40 mEq total) by mouth daily. 05/18/16  Yes Baird Cancer, PA-C  triamcinolone cream (KENALOG) 0.1 % Apply 1 application topically 2 (two) times daily. 09/16/15  Yes Patrici Ranks, MD  vitamin E 400 UNIT capsule Take 400 Units by mouth daily.   Yes Historical Provider, MD  clindamycin (CLEOCIN-T) 1 % lotion Apply topically 2 (two) times daily. 05/11/16   Baird Cancer, PA-C  cyclobenzaprine (FLEXERIL) 10 MG tablet Take 1 tablet (10 mg total) by mouth 3 (three) times daily as needed for muscle spasms. 03/03/16   Baird Cancer, PA-C  diazepam (VALIUM) 10 MG tablet Take 1 tablet (10 mg total) by mouth every 8 (eight) hours as needed for muscle spasms. 10/14/15   Patrici Ranks, MD  IRINOTECAN HCL IV Inject into the vein. Every 2 weeks    Historical Provider, MD  lidocaine-prilocaine (EMLA) cream Apply 1 application topically as needed. 08/19/15   Patrici Ranks, MD    loperamide (IMODIUM A-D) 2 MG tablet Take 2 at diarrhea onset , then 1 every 2hr until 12hrs with no BM. May take 2 every 4hrs at night. If diarrhea recurs repeat. 04/27/16   Patrici Ranks, MD  ondansetron (ZOFRAN) 8 MG tablet Take 1 tablet (8 mg total) by mouth every 8 (eight) hours as needed for nausea or vomiting. 05/04/16   Baird Cancer, PA-C  oxyCODONE-acetaminophen (PERCOCET/ROXICET) 5-325 MG tablet Take 1-2 tablets by mouth every 4 (four) hours as needed for moderate pain. 03/03/16   Baird Cancer, PA-C  Panitumumab (VECTIBIX IV) Inject into the vein. Every 2 weeks    Historical Provider, MD  prochlorperazine (COMPAZINE) 10 MG tablet Take 1 tablet (10 mg total) by mouth every 6 (six) hours as needed for nausea or vomiting. 05/04/16   Baird Cancer, PA-C    Physical Exam: Vitals:   06/18/2016 2127 06/25/2016 2233 06/10/2016 2316  BP: 132/61 124/73 127/89  Pulse: 74 67 65  Resp: 22 18 16   Temp:  97.7 F (36.5 C)   TempSrc:  Oral   SpO2: 95% 95% 100%  Weight:  99.3 kg (219 lb)    Height: 5\' 7"  (1.702 m)        Constitutional: NAD, calm, comfortable Vitals:   06/02/2016 2127 06/25/2016 2233 06/03/2016 2316  BP: 132/61 124/73 127/89  Pulse: 74 67 65  Resp: 22 18 16   Temp:  97.7 F (36.5 C)   TempSrc:  Oral   SpO2: 95% 95% 100%  Weight: 99.3 kg (219 lb)    Height: 5\' 7"  (1.702 m)     Eyes: PERRL, lids and conjunctivae normal ENMT: Mucous membranes are moist. Posterior pharynx clear of any exudate or lesions.Normal dentition.  Neck: normal, supple, no masses, no thyromegaly Respiratory: clear to auscultation bilaterally, no wheezing, no crackles. Normal respiratory effort. No accessory muscle use.  Cardiovascular: Regular rate and rhythm, no murmurs / rubs / gallops. No extremity edema. 2+ pedal pulses. No carotid bruits.  Abdomen: diffuse tenderness, no masses palpated. No hepatosplenomegaly. Bowel sounds positive.  Musculoskeletal: no clubbing / cyanosis. No joint  deformity upper and lower extremities. Good ROM, no contractures. Normal muscle tone.  Skin: no rashes, lesions, ulcers. No induration Neurologic: CN 2-12 grossly intact. Sensation intact, DTR normal. Strength 5/5 in all 4.  Psychiatric: Normal judgment and insight. Alert and oriented x 3. Normal mood.     Labs on Admission: I have personally reviewed following labs and imaging studies  CBC:  Recent Labs Lab 06/02/16 0835 06/03/2016 2223  WBC 6.9 11.5*  NEUTROABS 4.4  --   HGB 13.7 15.0  HCT 40.8 44.6  MCV 89.1 88.7  PLT 266 [id number]   Basic Metabolic Panel:  Recent Labs Lab 06/02/16 0835 06/23/2016 2223  NA 138 136  K 3.6 4.3  CL 105 103  CO2 25 22  GLUCOSE 143* 127*  BUN 13 25*  CREATININE 0.69 0.91  CALCIUM 8.5* 9.0  MG 2.0  --    GFR: Estimated Creatinine Clearance: 107.9 mL/min (by C-G formula based on SCr of 0.91 mg/dL). Liver Function Tests:  Recent Labs Lab 06/02/16 0835 06/03/2016 2223  AST 29 27  ALT 34 38  ALKPHOS 82 78  BILITOT 0.1* 0.8  PROT 6.7 7.4  ALBUMIN 3.8 4.4    Recent Labs Lab 06/24/2016 2223  LIPASE 20   Urine analysis:    Component Value Date/Time   COLORURINE YELLOW 02/19/2015 1500   APPEARANCEUR CLEAR 02/19/2015 1500   LABSPEC >1.030 (H) 02/19/2015 1500   PHURINE 6.0 02/19/2015 1500   GLUCOSEU NEGATIVE 02/19/2015 1500   HGBUR NEGATIVE 02/19/2015 1500   BILIRUBINUR NEGATIVE 02/19/2015 1500   KETONESUR NEGATIVE 02/19/2015 1500   PROTEINUR 100 (A) 02/19/2015 1500   UROBILINOGEN 0.2 02/19/2015 1500   NITRITE NEGATIVE 02/19/2015 1500   LEUKOCYTESUR NEGATIVE 02/19/2015 1500    Radiological Exams on Admission: Ct Abdomen Pelvis W Contrast  Result Date: 06/05/2016 CLINICAL DATA:  Sudden onset of severe lower abdominal pain at 18:00. EXAM: CT ABDOMEN AND PELVIS WITH CONTRAST TECHNIQUE: Multidetector CT imaging of the abdomen and pelvis was performed using the standard protocol following bolus administration of intravenous contrast.  CONTRAST:  139mL ISOVUE-300 IOPAMIDOL (ISOVUE-300) INJECTION 61% COMPARISON:  04/14/2016, 10/29/2015 FINDINGS: Lower chest: No acute abnormality. Hepatobiliary: Multiple hepatic metastases, many of which appear smaller than on 04/14/2016. For example the metastatic lesion adjacent to the gallbladder on image 28 series 2 measures 2.4 x 2.6 cm and previously measured 4.3 x 5.0 cm. No conclusive new lesions. Gallbladder and bile ducts are unremarkable. Pancreas: Unremarkable. No pancreatic ductal dilatation or surrounding inflammatory changes. Spleen:  Normal in size without focal abnormality. Adrenals/Urinary Tract: Adrenal glands are unremarkable. Benign upper pole and midpole left renal cysts measuring up to 2.6 cm, unchanged. Kidneys are otherwise normal, without renal calculi, focal lesion, or hydronephrosis. Bladder is remarkable only for mild mass effect on the right lateral bladder dome due to the adjacent soft tissue process. Stomach/Bowel: The cicatrizing soft tissue around the distal portions of the inferior mesenteric artery is now associated with a local bowel perforation. The mid sigmoid colon and portions of the ileum are matted together and acutely inflamed. There are several bubbles of extraluminal gas in the nearby mesentery. There is at least 1 focus of distal extraluminal gas, located anteriorly in the right upper quadrant on axial image 44 series 2 and on coronal image 18 series 4. No significant obstruction. It is unclear whether the extraluminal gas arises from the sigmoid or from the small bowel. The stomach and proximal small bowel are unremarkable. Remainder of the colon is unremarkable. Inflammatory stranding extends out from the low midline abdomen over into the right lower quadrant pericolic gutter and also over to the left pericolic gutter. Vascular/Lymphatic: Mild atherosclerotic calcification of the normal caliber aorta. 1 x 2 cm soft tissue surrounding the IMA origin, unchanged.  Reproductive: Unremarkable Other: Trace fluid in the pericolic gutters. No significant ascites. Musculoskeletal: No significant skeletal lesions. IMPRESSION: 1. Acute inflammation and focal perforation of bowel in the low midline abdomen, at the location of the previously observed cicatrizing soft tissue mass. Several bubbles of extraluminal gas are present in the nearby mesentery and there is at least 1 focus of distant gas visible in the right upper quadrant peritoneum. At the location of the inflammation/perforation, there are loops of ileum and mid sigmoid colon matted together, and it is indeterminate whether the perforation arises from the sigmoid or from the small bowel. No significant obstruction. These results were called by telephone at the time of interpretation on 06/05/2016 at 12:03 am to Dr. Pattricia Boss , who verbally acknowledged these results. 2. No other acute findings. 3. Decreased size of many of the hepatic metastases. No conclusive new metastases to the liver. Electronically Signed   By: Andreas Newport M.D.   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  Make NPO, given IVF.  Surgery was consulted.  Will give IV Pain meds.  Adenocarcinoma with liver mets:  Follow up with oncology.  His tumors appears smaller per CT report.   Hold chemotherapy at this time.    DVT prophylaxis: SUB Q heparin.  Code Status: full code.  Family Communication: wife and sister in laws at bedside.  Disposition Plan: to home likely  Consults called: Surgery, Dr Arnoldo Morale.  Admission status: Inpatient.    Keita Demarco MD FACP. [facility]  If 7PM-7AM, please contact night-coverage [email or web address] Password [email or web address]  06/05/2016, 1:04 AM

## 2016-06-05 NOTE — Progress Notes (Signed)
Patient's blood pressure 76/54,heart rate 133,oxygen sat 85 percent room air, Dr Carolin Sicks notified. Orders received,and given. Oxygen applied at 2 liters,SATs are now 92 percent.Pain level is now a 2. 1 liter of NS BOLUS initiated,per MD order. Blood pressure recheck at this time is 80/52,heart rate 124. Family at bedside. Will continue to monitor patient.

## 2016-06-05 NOTE — Progress Notes (Signed)
Dr Carolin Sicks arrived to room to discuss plan of care with wife. Patient to be transferred to ICU for closer monitoring. Palliative care also has been consulted.

## 2016-06-05 NOTE — Progress Notes (Signed)
PROGRESS NOTE    Tom [name]  [id number] DOB: 11-29-1965 DOA: 06/19/2016 PCP: [name], MD   Brief Narrative: 51 y.o. 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 CT abd/pelvic showed though the tumors appears smaller, there is free air, suggestive of bowel perforation in either the small bowel or the sigmoid colon. Evaluated by general surgery and admitted for further evaluation. Currently on IV Zosyn.  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 No surgical intervention at this time recommended conservative management. -Continue nothing by mouth, IV fluid, IV Zosyn for pain control. -Follow-up surgeon for their further recommendation.  #Pain management: Continue IV Dilaudid as needed. Ativan as needed for anxiety.  #stage IV metastatic colon cancer undergoing chemotherapy: Oncology consulted. Given stage IV cancer and now bowel perforation, I consulted palliative care to see the patient. I discussed with the palliative care team.  DVT prophylaxis: Heparin subcutaneous. Code Status: Full code Family Communication: Patient's wife at bedside Disposition Plan: Likely discharge home in 2-3 days    Consultants:   General surgery and palliative care  Oncology consulted  Procedures: None Antimicrobials: IV Zosyn  Subjective: A shunt was seen and examined at bedside. Patient was complaining of diffuse abdominal pain associated with nausea. Denied fever, chills, headache, chest pain or shortness of breath. Patient's wife at bedside. No diarrhea.   Objective: Vitals:   06/28/2016 2127 06/17/2016 2233 06/11/2016 2316 06/05/16 0144  BP: 132/61 124/73 127/89 131/85  Pulse:  74 67 65 74  Resp: 22 18 16  (!) 22  Temp:  97.7 F (36.5 C)  98.4 F (36.9 C)  TempSrc:  Oral  Oral  SpO2: 95% 95% 100% 98%  Weight: 99.3 kg (219 lb)   97.7 kg (215 lb 4.8 oz)  Height: 5\' 7"  (1.702 m)   5\' 7"  (1.702 m)    Intake/Output Summary (Last 24 hours) at 06/05/16 1357 Last data filed at 06/05/16 0927  Gross per 24 hour  Intake           238.33 ml  Output              300 ml  Net           -61.67 ml   Filed Weights   06/13/2016 2127 06/05/16 0144  Weight: 99.3 kg (219 lb) 97.7 kg (215 lb 4.8 oz)    Examination:  General exam:Looks uncomfortable because of abdominal pain Respiratory system: Clear to auscultation. Respiratory effort normal. No wheezing or crackle Cardiovascular system: S1 & S2 heard, RRR.  No pedal edema. Gastrointestinal system: Abdomen soft, diffuse tenderness. Bowel sound sluggish. Not distended. Central nervous system: Alert and oriented. No focal neurological deficits. Extremities: Symmetric 5 x 5 power. Skin: No rashes, lesions or ulcers Psychiatry: Judgement and insight appear normal. Mood & affect appropriate.     Data Reviewed: I have personally reviewed following labs and imaging studies  CBC:  Recent Labs Lab 06/02/16 0835 06/24/2016 2223  WBC 6.9 11.5*  NEUTROABS 4.4  --   HGB 13.7 15.0  HCT 40.8 44.6  MCV 89.1 88.7  PLT 266 [id number]   Basic Metabolic Panel:  Recent Labs Lab 06/02/16 0835 06/25/2016 2223  NA 138 136  K  3.6 4.3  CL 105 103  CO2 25 22  GLUCOSE 143* 127*  BUN 13 25*  CREATININE 0.69 0.91  CALCIUM 8.5* 9.0  MG 2.0  --    GFR: Estimated Creatinine Clearance: 106.9 mL/min (by C-G formula based on SCr of 0.91 mg/dL). Liver Function Tests:  Recent Labs Lab 06/02/16 0835 06/13/2016 2223  AST 29 27  ALT 34 38  ALKPHOS 82 78  BILITOT 0.1* 0.8  PROT 6.7 7.4  ALBUMIN 3.8 4.4    Recent Labs Lab 06/11/2016 2223  LIPASE 20   No results for input(s): AMMONIA in the last 168 hours. Coagulation Profile: No  results for input(s): INR, PROTIME in the last 168 hours. Cardiac Enzymes: No results for input(s): CKTOTAL, CKMB, CKMBINDEX, TROPONINI in the last 168 hours. BNP (last 3 results) No results for input(s): PROBNP in the last 8760 hours. HbA1C: No results for input(s): HGBA1C in the last 72 hours. CBG: No results for input(s): GLUCAP in the last 168 hours. Lipid Profile: No results for input(s): CHOL, HDL, LDLCALC, TRIG, CHOLHDL, LDLDIRECT in the last 72 hours. Thyroid Function Tests: No results for input(s): TSH, T4TOTAL, FREET4, T3FREE, THYROIDAB in the last 72 hours. Anemia Panel: No results for input(s): VITAMINB12, FOLATE, FERRITIN, TIBC, IRON, RETICCTPCT in the last 72 hours. Sepsis Labs: No results for input(s): PROCALCITON, LATICACIDVEN in the last 168 hours.  No results found for this or any previous visit (from the past 240 hour(s)).       Radiology Studies: Ct Abdomen Pelvis W Contrast  Result Date: 06/05/2016 CLINICAL DATA:  Sudden onset of severe lower abdominal pain at 18:00. EXAM: CT ABDOMEN AND PELVIS WITH CONTRAST TECHNIQUE: Multidetector CT imaging of the abdomen and pelvis was performed using the standard protocol following bolus administration of intravenous contrast. CONTRAST:  152mL ISOVUE-300 IOPAMIDOL (ISOVUE-300) INJECTION 61% COMPARISON:  04/14/2016, 10/29/2015 FINDINGS: Lower chest: No acute abnormality. Hepatobiliary: Multiple hepatic metastases, many of which appear smaller than on 04/14/2016. For example the metastatic lesion adjacent to the gallbladder on image 28 series 2 measures 2.4 x 2.6 cm and previously measured 4.3 x 5.0 cm. No conclusive new lesions. Gallbladder and bile ducts are unremarkable. Pancreas: Unremarkable. No pancreatic ductal dilatation or surrounding inflammatory changes. Spleen: Normal in size without focal abnormality. Adrenals/Urinary Tract: Adrenal glands are unremarkable. Benign upper pole and midpole left renal cysts measuring up to  2.6 cm, unchanged. Kidneys are otherwise normal, without renal calculi, focal lesion, or hydronephrosis. Bladder is remarkable only for mild mass effect on the right lateral bladder dome due to the adjacent soft tissue process. Stomach/Bowel: The cicatrizing soft tissue around the distal portions of the inferior mesenteric artery is now associated with a local bowel perforation. The mid sigmoid colon and portions of the ileum are matted together and acutely inflamed. There are several bubbles of extraluminal gas in the nearby mesentery. There is at least 1 focus of distal extraluminal gas, located anteriorly in the right upper quadrant on axial image 44 series 2 and on coronal image 18 series 4. No significant obstruction. It is unclear whether the extraluminal gas arises from the sigmoid or from the small bowel. The stomach and proximal small bowel are unremarkable. Remainder of the colon is unremarkable. Inflammatory stranding extends out from the low midline abdomen over into the right lower quadrant pericolic gutter and also over to the left pericolic gutter. Vascular/Lymphatic: Mild atherosclerotic calcification of the normal caliber aorta. 1 x 2 cm soft tissue surrounding the IMA origin, unchanged.  Reproductive: Unremarkable Other: Trace fluid in the pericolic gutters. No significant ascites. Musculoskeletal: No significant skeletal lesions. IMPRESSION: 1. Acute inflammation and focal perforation of bowel in the low midline abdomen, at the location of the previously observed cicatrizing soft tissue mass. Several bubbles of extraluminal gas are present in the nearby mesentery and there is at least 1 focus of distant gas visible in the right upper quadrant peritoneum. At the location of the inflammation/perforation, there are loops of ileum and mid sigmoid colon matted together, and it is indeterminate whether the perforation arises from the sigmoid or from the small bowel. No significant obstruction. These  results were called by telephone at the time of interpretation on 06/05/2016 at 12:03 am to Dr. Pattricia Boss , who verbally acknowledged these results. 2. No other acute findings. 3. Decreased size of many of the hepatic metastases. No conclusive new metastases to the liver. Electronically Signed   By: Andreas Newport M.D.   On: 06/05/2016 00:13        Scheduled Meds: . heparin  5,000 Units Subcutaneous Q8H  . piperacillin-tazobactam  3.375 g Intravenous Q8H  . sodium chloride flush  3 mL Intravenous Q12H   Continuous Infusions: . dextrose 5 % and 0.9% NaCl 100 mL/hr at 06/05/16 0151     LOS: 0 days    Draken Farrior Tanna Furry, MD [facility] Pager [phone]  If 7PM-7AM, please contact night-coverage [email or web address] Password [email or web address] 06/05/2016, 1:57 PM

## 2016-06-05 NOTE — Progress Notes (Signed)
The patient was reevaluated in ICU. He looks more alert. Systolic blood pressure 0000000 with phenylephrine drip. Currently hypoxic requiring high flow oxygen. He reported abdominal pain 2/10.  I made with multiple family members including patient's sisters, wife, son, daughter at bedside. As per patient and his wife's permission, I updated deteriorating clinical condition and poor prognosis. Family members agreed with current plan of care including DO NOT RESUSCITATE and mainly focus on quality of time.  I will continue current medical treatment in ICU today. We will closely monitor.  Re-discussed with the palliative care team.  Emotional support and comfort measures provided to the families.

## 2016-06-05 NOTE — Consult Note (Signed)
Reason for Consult: Stage IV colon carcinoma, perforated viscus Referring Physician: Dr. Frederich Balding is an 51 y.o. male.  HPI: Patient is a 51 year old white male who was diagnosed with stage IV colon carcinoma in 2016 who is undergoing chemotherapy, last receiving chemotherapy 3 days ago, who presented with acute onset of worsening abdominal pain. He presented to the emergency room with 9 out of 10 abdominal pain. CT scan revealed contained foci of extraluminal air, consistent with either perforated sigmoid colon or small bowel secondary to tumor. No abscess cavity, significant pneumoperitoneum, or fluid collection is noted. Patient was admitted to the hospital for further evaluation treatment.  Past Medical History:  Diagnosis Date  . 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 COLONOSCOPY N/A 07/30/2014   Procedure: COLONOSCOPY;  Surgeon: Inda Castle, MD;  Location: [facility];  Service: Endoscopy;  Laterality: N/A;  . ESOPHAGOGASTRODUODENOSCOPY N/A 07/30/2014   Procedure: ESOPHAGOGASTRODUODENOSCOPY (EGD);  Surgeon: Inda Castle, MD;  Location: [facility];  Service: Endoscopy;  Laterality: N/A;  . OPERATIVE ULTRASOUND N/A 08/01/2014   Procedure: OPERATIVE ULTRASOUND;  Surgeon: Gayland Curry, MD;  Location: [facility];  Service: General;  Laterality: N/A;  . PORTACATH PLACEMENT Right 08/01/14  . PORTACATH PLACEMENT N/A 08/01/2014   Procedure: INSERTION RIGHT IJ PORT-A-CATH WITH ULTRASOUND AND FLUORO;  Surgeon: Gayland Curry, MD;  Location: [facility];  Service: General;  Laterality: N/A;    Family History  Problem Relation Age of Onset  . Cancer Mother     Deceased at 11 years old from metastatic colon cancer  . Kidney failure Father   . Heart disease Father     Deceased in 40-50's  . Colon polyps Brother   . Cancer Maternal Aunt     Social History:  reports that he has never smoked. He quit  smokeless tobacco use about 27 years ago. His smokeless tobacco use included Chew. He reports that he does not drink alcohol or use drugs.  Allergies:  Allergies  Allergen Reactions  . Other Itching    Injection for pain given here at ED caused itching    Medications:  Prior to Admission:  Prescriptions Prior to Admission  Medication Sig Dispense Refill Last Dose  . ascorbic acid (VITAMIN C) 500 MG tablet Take 500 mg by mouth daily.    06/16/2016 at Unknown time  . bisacodyl (DULCOLAX) 5 MG EC tablet Take 10 mg by mouth daily as needed for moderate constipation.   06/12/2016 at Unknown time  . Calcium Carbonate-Vit D-Min (CALCIUM 1200) 1200-1000 MG-UNIT CHEW Chew 1 tablet by mouth daily. 30 each 1 06/20/2016 at Unknown time  . dexamethasone (DECADRON) 4 MG tablet Take 2 tablets (8 mg total) by mouth daily. Start the day after chemo for 2 days. 8 tablet 5 06/03/2016 at Unknown time  . diphenoxylate-atropine (LOMOTIL) 2.5-0.025 MG tablet Take by mouth 4 (four) times daily as needed for diarrhea or loose stools.   Past Week at Unknown time  . Ferrous Sulfate 27 MG TABS Take 1 tablet by mouth 2 (two) times daily.   06/26/2016 at Unknown time  . magnesium oxide (MAG-OX) 400 (241.3 Mg) MG tablet Take 1 tablet (400 mg total) by mouth 2 (two) times daily. 60 tablet 1 06/01/2016 at Unknown time  . Multiple Vitamin (MULTIVITAMIN WITH MINERALS) TABS tablet Take 1 tablet by mouth daily.   06/03/2016 at Unknown time  . potassium  chloride SA (K-DUR,KLOR-CON) 20 MEQ tablet Take 2 tablets (40 mEq total) by mouth daily. 60 tablet 1 06/28/2016 at Unknown time  . triamcinolone cream (KENALOG) 0.1 % Apply 1 application topically 2 (two) times daily. 30 g 0 Past Week at Unknown time  . vitamin E 400 UNIT capsule Take 400 Units by mouth daily.   06/08/2016 at Unknown time  . clindamycin (CLEOCIN-T) 1 % lotion Apply topically 2 (two) times daily. 60 mL 1 Taking  . cyclobenzaprine (FLEXERIL) 10 MG tablet Take 1 tablet (10 mg total)  by mouth 3 (three) times daily as needed for muscle spasms. 30 tablet 0 Taking  . diazepam (VALIUM) 10 MG tablet Take 1 tablet (10 mg total) by mouth every 8 (eight) hours as needed for muscle spasms. 60 tablet 1 Taking  . IRINOTECAN HCL IV Inject into the vein. Every 2 weeks   06/02/2016  . lidocaine-prilocaine (EMLA) cream Apply 1 application topically as needed. 30 g 2 06/02/2016  . loperamide (IMODIUM A-D) 2 MG tablet Take 2 at diarrhea onset , then 1 every 2hr until 12hrs with no BM. May take 2 every 4hrs at night. If diarrhea recurs repeat. 100 tablet 1 05/31/2016  . ondansetron (ZOFRAN) 8 MG tablet Take 1 tablet (8 mg total) by mouth every 8 (eight) hours as needed for nausea or vomiting. 30 tablet 2 06/02/2016  . oxyCODONE-acetaminophen (PERCOCET/ROXICET) 5-325 MG tablet Take 1-2 tablets by mouth every 4 (four) hours as needed for moderate pain. 60 tablet 0 Taking  . Panitumumab (VECTIBIX IV) Inject into the vein. Every 2 weeks   06/02/2016  . prochlorperazine (COMPAZINE) 10 MG tablet Take 1 tablet (10 mg total) by mouth every 6 (six) hours as needed for nausea or vomiting. 30 tablet 2 06/02/2016   Scheduled: . heparin  5,000 Units Subcutaneous Q8H  . piperacillin-tazobactam  3.375 g Intravenous Q8H  . sodium chloride flush  3 mL Intravenous Q12H    Results for orders placed or performed during the hospital encounter of 06/12/2016 (from the past 48 hour(s))  CBC     Status: Abnormal   Collection Time: 06/23/2016 10:23 PM  Result Value Ref Range   WBC 11.5 (H) 4.0 - 10.5 K/uL   RBC 5.03 4.22 - 5.81 MIL/uL   Hemoglobin 15.0 13.0 - 17.0 g/dL   HCT 44.6 39.0 - 52.0 %   MCV 88.7 78.0 - 100.0 fL   MCH 29.8 26.0 - 34.0 pg   MCHC 33.6 30.0 - 36.0 g/dL   RDW 14.4 11.5 - 15.5 %   Platelets 253 150 - 400 K/uL  Lipase, blood     Status: None   Collection Time: 06/08/2016 10:23 PM  Result Value Ref Range   Lipase 20 11 - 51 U/L  Comprehensive metabolic panel     Status: Abnormal   Collection Time:  06/01/2016 10:23 PM  Result Value Ref Range   Sodium 136 135 - 145 mmol/L   Potassium 4.3 3.5 - 5.1 mmol/L   Chloride 103 101 - 111 mmol/L   CO2 22 22 - 32 mmol/L   Glucose, Bld 127 (H) 65 - 99 mg/dL   BUN 25 (H) 6 - 20 mg/dL   Creatinine, Ser 0.91 0.61 - 1.24 mg/dL   Calcium 9.0 8.9 - 10.3 mg/dL   Total Protein 7.4 6.5 - 8.1 g/dL   Albumin 4.4 3.5 - 5.0 g/dL   AST 27 15 - 41 U/L   ALT 38 17 - 63 U/L  Alkaline Phosphatase 78 38 - 126 U/L   Total Bilirubin 0.8 0.3 - 1.2 mg/dL   GFR calc non Af Amer >60 >60 mL/min   GFR calc Af Amer >60 >60 mL/min    Comment: (NOTE) The eGFR has been calculated using the CKD EPI equation. This calculation has not been validated in all clinical situations. eGFR's persistently <60 mL/min signify possible Chronic Kidney Disease.    Anion gap 11 5 - 15  Urinalysis, Routine w reflex microscopic     Status: None   Collection Time: 06/05/16  4:15 AM  Result Value Ref Range   Color, Urine YELLOW YELLOW   APPearance CLEAR CLEAR   Specific Gravity, Urine 1.025 1.005 - 1.030   pH 5.0 5.0 - 8.0   Glucose, UA NEGATIVE NEGATIVE mg/dL   Hgb urine dipstick NEGATIVE NEGATIVE   Bilirubin Urine NEGATIVE NEGATIVE   Ketones, ur NEGATIVE NEGATIVE mg/dL   Protein, ur NEGATIVE NEGATIVE mg/dL   Nitrite NEGATIVE NEGATIVE   Leukocytes, UA NEGATIVE NEGATIVE    Comment: Microscopic not done on urines with negative protein, blood, leukocytes, nitrite, or glucose < 500 mg/dL.    Ct Abdomen Pelvis W Contrast  Result Date: 06/05/2016 CLINICAL DATA:  Sudden onset of severe lower abdominal pain at 18:00. EXAM: CT ABDOMEN AND PELVIS WITH CONTRAST TECHNIQUE: Multidetector CT imaging of the abdomen and pelvis was performed using the standard protocol following bolus administration of intravenous contrast. CONTRAST:  163m ISOVUE-300 IOPAMIDOL (ISOVUE-300) INJECTION 61% COMPARISON:  04/14/2016, 10/29/2015 FINDINGS: Lower chest: No acute abnormality. Hepatobiliary: Multiple hepatic  metastases, many of which appear smaller than on 04/14/2016. For example the metastatic lesion adjacent to the gallbladder on image 28 series 2 measures 2.4 x 2.6 cm and previously measured 4.3 x 5.0 cm. No conclusive new lesions. Gallbladder and bile ducts are unremarkable. Pancreas: Unremarkable. No pancreatic ductal dilatation or surrounding inflammatory changes. Spleen: Normal in size without focal abnormality. Adrenals/Urinary Tract: Adrenal glands are unremarkable. Benign upper pole and midpole left renal cysts measuring up to 2.6 cm, unchanged. Kidneys are otherwise normal, without renal calculi, focal lesion, or hydronephrosis. Bladder is remarkable only for mild mass effect on the right lateral bladder dome due to the adjacent soft tissue process. Stomach/Bowel: The cicatrizing soft tissue around the distal portions of the inferior mesenteric artery is now associated with a local bowel perforation. The mid sigmoid colon and portions of the ileum are matted together and acutely inflamed. There are several bubbles of extraluminal gas in the nearby mesentery. There is at least 1 focus of distal extraluminal gas, located anteriorly in the right upper quadrant on axial image 44 series 2 and on coronal image 18 series 4. No significant obstruction. It is unclear whether the extraluminal gas arises from the sigmoid or from the small bowel. The stomach and proximal small bowel are unremarkable. Remainder of the colon is unremarkable. Inflammatory stranding extends out from the low midline abdomen over into the right lower quadrant pericolic gutter and also over to the left pericolic gutter. Vascular/Lymphatic: Mild atherosclerotic calcification of the normal caliber aorta. 1 x 2 cm soft tissue surrounding the IMA origin, unchanged. Reproductive: Unremarkable Other: Trace fluid in the pericolic gutters. No significant ascites. Musculoskeletal: No significant skeletal lesions. IMPRESSION: 1. Acute inflammation and  focal perforation of bowel in the low midline abdomen, at the location of the previously observed cicatrizing soft tissue mass. Several bubbles of extraluminal gas are present in the nearby mesentery and there is at least 1 focus  of distant gas visible in the right upper quadrant peritoneum. At the location of the inflammation/perforation, there are loops of ileum and mid sigmoid colon matted together, and it is indeterminate whether the perforation arises from the sigmoid or from the small bowel. No significant obstruction. These results were called by telephone at the time of interpretation on 06/05/2016 at 12:03 am to Dr. Pattricia Boss , who verbally acknowledged these results. 2. No other acute findings. 3. Decreased size of many of the hepatic metastases. No conclusive new metastases to the liver. Electronically Signed   By: Andreas Newport M.D.   On: 06/05/2016 00:13    ROS:  Pertinent items noted in HPI and remainder of comprehensive ROS otherwise negative.  Blood pressure 131/85, pulse 74, temperature 98.4 F (36.9 C), temperature source Oral, resp. rate (!) 22, height 5' 7"  (1.702 m), weight 97.7 kg (215 lb 4.8 oz), SpO2 98 %. Physical Exam: Well-developed well-nourished white male who appears anxious Head is normocephalic, atraumatic Neck is supple without lymphadenopathy. Lungs clear to auscultation with equal breath sounds bilaterally. Heart examination reveals a regular rate and rhythm without S3, S4, murmurs Abdomen is soft but mildly distended with nonspecific tenderness throughout. No rigidity is noted. CT scan images personally reviewed by myself. I did discuss this case with Dr. Whitney Muse of oncology.  Assessment/Plan: Impression: Stage IV colon carcinoma with contained perforation of bowel. Plan: Given his extent of disease, I would try to manage this conservatively without surgery. Wound continue bowel rest, IV antibiotics, and pain control. Will follow closely with you. No acute  surgical intervention needed at this time.  Mireya Meditz A 06/05/2016, 10:32 AM

## 2016-06-05 NOTE — Consult Note (Signed)
Consultation Note Date: 06/05/2016   Patient Name: Tom [name]  DOB: 10/06/1965  MRN: [id number]  Age / Sex: 51 y.o., male  PCP: Lemmie Evens, MD Referring Physician: Rosita Fire, MD  Reason for Consultation: Establishing goals of care and Psychosocial/spiritual support  HPI/Patient Profile: 51 y.o. male  with past medical history of Adenocarcinoma of the colon with metastatic burden to bilateral lungs and with hepatic involvement, diagnosed February 2016 admitted on 06/21/2016 with bowel perforation, not a surgical candidate.   Clinical Assessment and Goals of Care: Mr. Coppess is resting quietly in bed. He is able to greet me making and keeping eye contact. He is able to tell me his name and where we are. He does tell me that he has pain in his abdomen, and I share that we are unable to give pain medications due to his low blood pressure.   I share my worry about his overall prognosis with Mrs. Zurawski, who states she has felt his suffering when he was receiving chemotherapy. I encouraged her to consider focusing on comfort, if after 2-3 days, Mr. Peterson shows no improvement. Mrs. Bautista again states that she has seen her husband suffering, and agrees that CPR or intubation would only prolong this suffering.  Mrs. Cortright has called family members in to be with them during this difficult time.  Healthcare power of attorney NEXT OF KIN wife, Degan Mccrery   SUMMARY OF RECOMMENDATIONS   24 to 48 hour trial in ICU. We discuss focusing on comfort only if, after 48 hours, Mr. Greenhaw is not improving. I do not ask Mrs. Avitia to make a decision at this point, only consider options.  Code Status/Advance Care Planning:  DNR  Symptom Management:   per hospitalist. Pain management complicated by low blood pressure. This is discussed with patient and wife.  Palliative Prophylaxis:    Frequent Pain Assessment and Turn Reposition  Additional Recommendations (Limitations, Scope, Preferences):  Continue to treat the treatable for 24 to 48 hours.   Psycho-social/Spiritual:   Desire for further Chaplaincy support:no  Additional Recommendations: Caregiving  Support/Resources  Prognosis:   Unable to determine, less than one week would not be surprising based on bowel perforation, not a surgical candidate.  Discharge Planning: To be determined, Hospital death would not be surprising.       Primary Diagnoses: Present on Admission: . 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 Abdominal pain . Colon cancer metastasized to liver ([place]) . Bowel perforation ([place])   I have reviewed the medical record, interviewed the patient and family, and examined the patient. The following aspects are pertinent.  Past Medical History:  Diagnosis Date  . 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 Marital status: Married    Spouse name: N/A  . Number of children: N/A  . Years of education: N/A   Social History Main Topics  . Smoking status: Never Smoker  . Smokeless tobacco: Former Systems developer  Types: Sarina Ser    Quit date: 06/01/1989  . Alcohol use No  . Drug use: No  . Sexual activity: Not Asked   Other Topics Concern  . None   Social History Narrative  . None   Family History  Problem Relation Age of Onset  . Cancer Mother     Deceased at 34 years old from metastatic colon cancer  . Kidney failure Father   . Heart disease Father     Deceased in 40-50's  . Colon polyps Brother   . Cancer Maternal Aunt    Scheduled Meds: . heparin  5,000 Units Subcutaneous Q8H  . piperacillin-tazobactam  3.375 g Intravenous Q8H  . sodium chloride flush  3 mL Intravenous Q12H   Continuous Infusions: . sodium chloride 100 mL/hr at 06/05/16 1604  . phenylephrine (NEO-SYNEPHRINE) Adult infusion 20 mcg/min (06/05/16  1602)   PRN Meds:.HYDROmorphone (DILAUDID) injection, LORazepam, ondansetron **OR** ondansetron (ZOFRAN) IV Medications Prior to Admission:  Prior to Admission medications   Medication Sig Start Date End Date Taking? Authorizing Provider  ascorbic acid (VITAMIN C) 500 MG tablet Take 500 mg by mouth daily.    Yes Historical Provider, MD  bisacodyl (DULCOLAX) 5 MG EC tablet Take 10 mg by mouth daily as needed for moderate constipation.   Yes Historical Provider, MD  Calcium Carbonate-Vit D-Min (CALCIUM 1200) 1200-1000 MG-UNIT CHEW Chew 1 tablet by mouth daily. 05/18/16  Yes Manon Hilding Kefalas, PA-C  dexamethasone (DECADRON) 4 MG tablet Take 2 tablets (8 mg total) by mouth daily. Start the day after chemo for 2 days. 04/27/16  Yes Patrici Ranks, MD  diphenoxylate-atropine (LOMOTIL) 2.5-0.025 MG tablet Take by mouth 4 (four) times daily as needed for diarrhea or loose stools.   Yes Historical Provider, MD  Ferrous Sulfate 27 MG TABS Take 1 tablet by mouth 2 (two) times daily.   Yes Historical Provider, MD  magnesium oxide (MAG-OX) 400 (241.3 Mg) MG tablet Take 1 tablet (400 mg total) by mouth 2 (two) times daily. 05/18/16  Yes Baird Cancer, PA-C  Multiple Vitamin (MULTIVITAMIN WITH MINERALS) TABS tablet Take 1 tablet by mouth daily.   Yes Historical Provider, MD  potassium chloride SA (K-DUR,KLOR-CON) 20 MEQ tablet Take 2 tablets (40 mEq total) by mouth daily. 05/18/16  Yes Baird Cancer, PA-C  triamcinolone cream (KENALOG) 0.1 % Apply 1 application topically 2 (two) times daily. 09/16/15  Yes Patrici Ranks, MD  vitamin E 400 UNIT capsule Take 400 Units by mouth daily.   Yes Historical Provider, MD  clindamycin (CLEOCIN-T) 1 % lotion Apply topically 2 (two) times daily. 05/11/16   Baird Cancer, PA-C  cyclobenzaprine (FLEXERIL) 10 MG tablet Take 1 tablet (10 mg total) by mouth 3 (three) times daily as needed for muscle spasms. 03/03/16   Baird Cancer, PA-C  diazepam (VALIUM) 10 MG  tablet Take 1 tablet (10 mg total) by mouth every 8 (eight) hours as needed for muscle spasms. 10/14/15   Patrici Ranks, MD  IRINOTECAN HCL IV Inject into the vein. Every 2 weeks    Historical Provider, MD  lidocaine-prilocaine (EMLA) cream Apply 1 application topically as needed. 08/19/15   Patrici Ranks, MD  loperamide (IMODIUM A-D) 2 MG tablet Take 2 at diarrhea onset , then 1 every 2hr until 12hrs with no BM. May take 2 every 4hrs at night. If diarrhea recurs repeat. 04/27/16   Patrici Ranks, MD  ondansetron (ZOFRAN) 8 MG tablet Take 1 tablet (8  mg total) by mouth every 8 (eight) hours as needed for nausea or vomiting. 05/04/16   Baird Cancer, PA-C  oxyCODONE-acetaminophen (PERCOCET/ROXICET) 5-325 MG tablet Take 1-2 tablets by mouth every 4 (four) hours as needed for moderate pain. 03/03/16   Baird Cancer, PA-C  Panitumumab (VECTIBIX IV) Inject into the vein. Every 2 weeks    Historical Provider, MD  prochlorperazine (COMPAZINE) 10 MG tablet Take 1 tablet (10 mg total) by mouth every 6 (six) hours as needed for nausea or vomiting. 05/04/16   Baird Cancer, PA-C   Allergies  Allergen Reactions  . Other Itching    Injection for pain given here at ED caused itching   Review of Systems  Unable to perform ROS: Acuity of condition    Physical Exam  Constitutional: He is oriented to person, place, and time. No distress.  Chronically ill appearing  HENT:  Head: Normocephalic and atraumatic.  Cardiovascular: Normal rate.   Pulmonary/Chest: Effort normal. No respiratory distress.  Abdominal: Soft. He exhibits distension. There is tenderness.  Neurological: He is alert and oriented to person, place, and time.  Skin: Skin is warm and dry.  Nursing note and vitals reviewed.   Vital Signs: BP (!) 81/47 (BP Location: Left Arm)   Pulse (!) 121   Temp 98.4 F (36.9 C) (Oral)   Resp (!) 22   Ht 5\' 7"  (1.702 m)   Wt 97.7 kg (215 lb 4.8 oz)   SpO2 (!) 89%   BMI 33.72 kg/m   Pain Assessment: 0-10   Pain Score: 4    SpO2: SpO2: (!) 89 % O2 Device:SpO2: (!) 89 % O2 Flow Rate: .O2 Flow Rate (L/min): 10 L/min  IO: Intake/output summary:  Intake/Output Summary (Last 24 hours) at 06/05/16 1605 Last data filed at 06/05/16 [id number]  Gross per 24 hour  Intake           238.33 ml  Output              300 ml  Net           -61.67 ml    LBM: Last BM Date: 06/02/16 Baseline Weight: Weight: 99.3 kg (219 lb) Most recent weight: Weight: 97.7 kg (215 lb 4.8 oz)     Palliative Assessment/Data:   Flowsheet Rows   Flowsheet Row Most Recent Value  Intake Tab  Referral Department  Hospitalist  Unit at Time of Referral  Med/Surg Unit  Palliative Care Primary Diagnosis  Cancer  Date Notified  06/05/16  Palliative Care Type  New Palliative care  Reason for referral  Clarify Goals of Care  Date of Admission  06/07/2016  # of days IP prior to Palliative referral  1  Clinical Assessment  Palliative Performance Scale Score  40%  Pain Max last 24 hours  Not able to report  Pain Min Last 24 hours  Not able to report  Dyspnea Max Last 24 Hours  Not able to report  Dyspnea Min Last 24 hours  Not able to report  Psychosocial & Spiritual Assessment  Palliative Care Outcomes  Patient/Family meeting held?  Yes  Who was at the meeting?  Patient and wife Inez Catalina  Palliative Care Outcomes  Provided advance care planning, Provided psychosocial or spiritual support, Clarified goals of care  Patient/Family wishes: Interventions discontinued/not started   Mechanical Ventilation  Palliative Care follow-up planned  -- [Follow-up while at [facility]]      Time In: 1405 Time Out: 1440 Time Total: 35 minutes  Greater than 50%  of this time was spent counseling and coordinating care related to the above assessment and plan.  Signed by: Drue Novel, NP   Please contact Palliative Medicine Team phone at [phone] for questions and concerns.  For individual provider: See Shea Evans

## 2016-06-06 ENCOUNTER — Other Ambulatory Visit: Payer: Self-pay | Admitting: Nurse Practitioner

## 2016-06-06 DIAGNOSIS — J9601 Acute respiratory failure with hypoxia: Secondary | ICD-10-CM

## 2016-06-06 DIAGNOSIS — R6521 Severe sepsis with septic shock: Secondary | ICD-10-CM

## 2016-06-06 DIAGNOSIS — A419 Sepsis, unspecified organism: Secondary | ICD-10-CM

## 2016-06-06 LAB — CBC
HEMATOCRIT: 49.4 % (ref 39.0–52.0)
HEMOGLOBIN: 16.1 g/dL (ref 13.0–17.0)
MCH: 29.8 pg (ref 26.0–34.0)
MCHC: 32.6 g/dL (ref 30.0–36.0)
MCV: 91.5 fL (ref 78.0–100.0)
Platelets: 267 10*3/uL (ref 150–400)
RBC: 5.4 MIL/uL (ref 4.22–5.81)
RDW: 15.4 % (ref 11.5–15.5)
WBC: 5.5 10*3/uL (ref 4.0–10.5)

## 2016-06-06 LAB — BASIC METABOLIC PANEL
Anion gap: 19 — ABNORMAL HIGH (ref 5–15)
BUN: 49 mg/dL — ABNORMAL HIGH (ref 6–20)
CHLORIDE: 108 mmol/L (ref 101–111)
CO2: 13 mmol/L — AB (ref 22–32)
Calcium: 8.1 mg/dL — ABNORMAL LOW (ref 8.9–10.3)
Creatinine, Ser: 4.26 mg/dL — ABNORMAL HIGH (ref 0.61–1.24)
GFR calc Af Amer: 17 mL/min — ABNORMAL LOW (ref >60)
GFR calc non Af Amer: 15 mL/min — ABNORMAL LOW (ref >60)
GLUCOSE: 93 mg/dL (ref 65–99)
POTASSIUM: 5 mmol/L (ref 3.5–5.1)
Sodium: 140 mmol/L (ref 135–145)

## 2016-06-06 MED ORDER — PHENYLEPHRINE HCL 10 MG/ML IJ SOLN
INTRAMUSCULAR | Status: AC
  Filled 2016-06-06: qty 3

## 2016-06-06 MED ORDER — ACETAMINOPHEN 650 MG RE SUPP
650.0000 mg | RECTAL | Status: DC | PRN
  Administered 2016-06-06: 650 mg via RECTAL
  Filled 2016-06-06: qty 1

## 2016-06-06 MED ORDER — MORPHINE SULFATE (PF) 2 MG/ML IV SOLN
2.0000 mg | Freq: Once | INTRAVENOUS | Status: AC
  Administered 2016-06-06: 2 mg via INTRAVENOUS
  Filled 2016-06-06: qty 1

## 2016-06-06 MED ORDER — PHENYLEPHRINE HCL 10 MG/ML IJ SOLN
0.0000 ug/min | INTRAMUSCULAR | Status: DC
  Administered 2016-06-06: 110 ug/min via INTRAVENOUS
  Filled 2016-06-06 (×2): qty 2

## 2016-06-06 MED ORDER — METRONIDAZOLE IN NACL 5-0.79 MG/ML-% IV SOLN
500.0000 mg | Freq: Three times a day (TID) | INTRAVENOUS | Status: DC
  Administered 2016-06-06: 500 mg via INTRAVENOUS
  Filled 2016-06-06: qty 100

## 2016-06-06 MED ORDER — FENTANYL CITRATE (PF) 100 MCG/2ML IJ SOLN
50.0000 ug | INTRAMUSCULAR | Status: DC | PRN
  Administered 2016-06-06: 50 ug via INTRAVENOUS
  Filled 2016-06-06: qty 2

## 2016-06-15 ENCOUNTER — Ambulatory Visit (HOSPITAL_COMMUNITY): Payer: Self-pay | Admitting: Oncology

## 2016-06-15 ENCOUNTER — Ambulatory Visit (HOSPITAL_COMMUNITY): Payer: Self-pay

## 2016-06-29 ENCOUNTER — Ambulatory Visit (HOSPITAL_COMMUNITY): Payer: Self-pay | Admitting: Hematology & Oncology

## 2016-06-29 ENCOUNTER — Ambulatory Visit (HOSPITAL_COMMUNITY): Payer: Self-pay | Admitting: Oncology

## 2016-06-29 ENCOUNTER — Ambulatory Visit (HOSPITAL_COMMUNITY): Payer: Self-pay

## 2016-07-02 NOTE — Progress Notes (Signed)
Patient is clinically declining.  He has labored breathing, hypoxic on 15 liters oxygen, mental status worsening.  BP hypotensive on pressors.   Discussed with the patient son and wife at bedside.  Now focus on comfort measures.  Pt has tachypnia, labored breathing and looks uncomfortable.  Family wants to make him comfortable.  Add fentanyl IV

## 2016-07-02 NOTE — Accreditation Note (Addendum)
PT REMAINS PICKED UP BY [place]. DEATH CERTIFICATE SENT WITH PT. NORMAL SALINE EYE PREP PREFORMED ON EYES.

## 2016-07-02 NOTE — Progress Notes (Signed)
Pt is on 15L high flow nasal cannula. O2 is reading 85. Placed Pt on 100% NRB. Pt still is only oxygenating at 89%. Pt respirations are 38. Spoke with son about possibility of needing BiPaP.

## 2016-07-02 NOTE — Discharge Summary (Signed)
Death Summary  Tom [name] [id number] DOB: [birth year] DOA: Jun 22, 2016  PCP: [name], MD  Admit date: 22-Jun-2016 Date of Death: 24-Jun-2016 Time of Death: 11:40 AM Notification: To family members presented at bedside.  History of present illness:  Tom [name] is a 51 y.o. 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 CT abd/pelvic showed though the tumors appears smaller, there is free air, suggestive of bowel perforation in either the small bowel or the sigmoid colon. Evaluated by general surgery and admitted for further evaluation. Not a surgical candidate as per general surgery. Pt is clinically declining. Transfer to ICU. After discussion with the multiple family members patient is DO NOT RESUSCITATE DO NOT INTUBATE and now comfort measures.  Patient was treated with IV Zosyn, Flagyl, IV fluid and supportive care. Treated with phenylephrine drip for hypotension (shock). Patient clinically deteriorated with worsening acute hypoxic respiratory failure, shock, acute kidney injury. I have had long discussion with the multiple family members including wife and son. Decided to focus on comfort measures and pain management. Patient does relate passed away today morning around 11:40 AM. Emotional support provided to the patient's family members present at bedside.  Final Diagnoses:  1.  Acute cardiopulmonary arrest 2. Bowel perforation  3. Hypotension leading to shock   The results of significant diagnostics from this hospitalization (including imaging, microbiology, ancillary and laboratory) are listed below for reference.    Significant Diagnostic Studies: Mr Lumbar Spine W Wo Contrast  Result Date: 05/08/2016 CLINICAL DATA:  Low back pain with left hip pain for the past couple years. No known injury. EXAM: MRI LUMBAR SPINE WITHOUT AND WITH CONTRAST TECHNIQUE:  Multiplanar and multiecho pulse sequences of the lumbar spine were obtained without and with intravenous contrast. CONTRAST:  74mL MULTIHANCE GADOBENATE DIMEGLUMINE 529 MG/ML IV SOLN COMPARISON:  None. FINDINGS: Segmentation:  Standard. Alignment:  Physiologic. Vertebrae:  No fracture, evidence of discitis, or bone lesion. Conus medullaris: Extends to the T12 level and appears normal. Paraspinal and other soft tissues: No paraspinal abnormality. Disc levels: Disc spaces: Degenerative disc disease with disc height loss at L2-3, L3-4, L4-5 and L5-S1. T12-L1: Broad-based disc bulge with a small central disc protrusion. No evidence of neural foraminal stenosis. No central canal stenosis. L1-L2: Broad-based disc bulge. No evidence of neural foraminal stenosis. No central canal stenosis. L2-L3: Broad-based disc bulge with a small central disc protrusion. Shallow right foraminal disc protrusion. No evidence of neural foraminal stenosis. No central canal stenosis. L3-L4: Broad-based disc bulge eccentric towards the right. Mild bilateral facet arthropathy. No evidence of neural foraminal stenosis. No central canal stenosis. L4-L5: Broad-based disc bulge with a small central disc protrusion. Bilateral lateral recess stenosis. Mild bilateral facet arthropathy. No evidence of neural foraminal stenosis. No central canal stenosis. L5-S1: Mild broad-based disc bulge. Mild left foraminal stenosis. No right foraminal stenosis. No central canal stenosis. IMPRESSION: 1. Lumbar spine spondylosis as described above. 2. At L4-5 there is a broad-based disc bulge with a small central disc protrusion. Bilateral lateral recess stenosis. Mild bilateral facet arthropathy. 3. No evidence of osseous malignancy. Electronically Signed   By: Kathreen Devoid   On: 05/08/2016 13:42   Ct Abdomen Pelvis W Contrast  Result Date: 06/05/2016 CLINICAL DATA:  Sudden onset of severe lower abdominal pain at 18:00. EXAM: CT ABDOMEN AND PELVIS WITH CONTRAST  TECHNIQUE: Multidetector CT imaging of the abdomen and pelvis  was performed using the standard protocol following bolus administration of intravenous contrast. CONTRAST:  166mL ISOVUE-300 IOPAMIDOL (ISOVUE-300) INJECTION 61% COMPARISON:  04/14/2016, 10/29/2015 FINDINGS: Lower chest: No acute abnormality. Hepatobiliary: Multiple hepatic metastases, many of which appear smaller than on 04/14/2016. For example the metastatic lesion adjacent to the gallbladder on image 28 series 2 measures 2.4 x 2.6 cm and previously measured 4.3 x 5.0 cm. No conclusive new lesions. Gallbladder and bile ducts are unremarkable. Pancreas: Unremarkable. No pancreatic ductal dilatation or surrounding inflammatory changes. Spleen: Normal in size without focal abnormality. Adrenals/Urinary Tract: Adrenal glands are unremarkable. Benign upper pole and midpole left renal cysts measuring up to 2.6 cm, unchanged. Kidneys are otherwise normal, without renal calculi, focal lesion, or hydronephrosis. Bladder is remarkable only for mild mass effect on the right lateral bladder dome due to the adjacent soft tissue process. Stomach/Bowel: The cicatrizing soft tissue around the distal portions of the inferior mesenteric artery is now associated with a local bowel perforation. The mid sigmoid colon and portions of the ileum are matted together and acutely inflamed. There are several bubbles of extraluminal gas in the nearby mesentery. There is at least 1 focus of distal extraluminal gas, located anteriorly in the right upper quadrant on axial image 44 series 2 and on coronal image 18 series 4. No significant obstruction. It is unclear whether the extraluminal gas arises from the sigmoid or from the small bowel. The stomach and proximal small bowel are unremarkable. Remainder of the colon is unremarkable. Inflammatory stranding extends out from the low midline abdomen over into the right lower quadrant pericolic gutter and also over to the left pericolic  gutter. Vascular/Lymphatic: Mild atherosclerotic calcification of the normal caliber aorta. 1 x 2 cm soft tissue surrounding the IMA origin, unchanged. Reproductive: Unremarkable Other: Trace fluid in the pericolic gutters. No significant ascites. Musculoskeletal: No significant skeletal lesions. IMPRESSION: 1. Acute inflammation and focal perforation of bowel in the low midline abdomen, at the location of the previously observed cicatrizing soft tissue mass. Several bubbles of extraluminal gas are present in the nearby mesentery and there is at least 1 focus of distant gas visible in the right upper quadrant peritoneum. At the location of the inflammation/perforation, there are loops of ileum and mid sigmoid colon matted together, and it is indeterminate whether the perforation arises from the sigmoid or from the small bowel. No significant obstruction. These results were called by telephone at the time of interpretation on 06/05/2016 at 12:03 am to Dr. Pattricia Boss , who verbally acknowledged these results. 2. No other acute findings. 3. Decreased size of many of the hepatic metastases. No conclusive new metastases to the liver. Electronically Signed   By: Andreas Newport M.D.   On: 06/05/2016 00:13    Microbiology: Recent Results (from the past 240 hour(s))  MRSA PCR Screening     Status: None   Collection Time: 06/05/16  4:05 PM  Result Value Ref Range Status   MRSA by PCR NEGATIVE NEGATIVE Final    Comment:        The GeneXpert MRSA Assay (FDA approved for NASAL specimens only), is one component of a comprehensive MRSA colonization surveillance program. It is not intended to diagnose MRSA infection nor to guide or monitor treatment for MRSA infections.      Labs: Basic Metabolic Panel:  Recent Labs Lab 06/02/16 0835 06/01/2016 2223 06-27-16 0505  NA 138 136 140  K 3.6 4.3 5.0  CL 105 103 108  CO2 25 22  13*  GLUCOSE 143* 127* 93  BUN 13 25* 49*  CREATININE 0.69 0.91 4.26*    CALCIUM 8.5* 9.0 8.1*  MG 2.0  --   --    Liver Function Tests:  Recent Labs Lab 06/02/16 0835 06/26/2016 2223  AST 29 27  ALT 34 38  ALKPHOS 82 78  BILITOT 0.1* 0.8  PROT 6.7 7.4  ALBUMIN 3.8 4.4    Recent Labs Lab 06/22/2016 2223  LIPASE 20   No results for input(s): AMMONIA in the last 168 hours. CBC:  Recent Labs Lab 06/02/16 0835 06/03/2016 2223 2016/07/01 0505  WBC 6.9 11.5* 5.5  NEUTROABS 4.4  --   --   HGB 13.7 15.0 16.1  HCT 40.8 44.6 49.4  MCV 89.1 88.7 91.5  PLT 266 253 267   Cardiac Enzymes: No results for input(s): CKTOTAL, CKMB, CKMBINDEX, TROPONINI in the last 168 hours. D-Dimer No results for input(s): DDIMER in the last 72 hours. BNP: Invalid input(s): POCBNP CBG: No results for input(s): GLUCAP in the last 168 hours. Anemia work up No results for input(s): VITAMINB12, FOLATE, FERRITIN, TIBC, IRON, RETICCTPCT in the last 72 hours. Urinalysis    Component Value Date/Time   COLORURINE YELLOW 06/05/2016 0415   APPEARANCEUR CLEAR 06/05/2016 0415   LABSPEC 1.025 06/05/2016 0415   PHURINE 5.0 06/05/2016 0415   GLUCOSEU NEGATIVE 06/05/2016 0415   HGBUR NEGATIVE 06/05/2016 0415   BILIRUBINUR NEGATIVE 06/05/2016 0415   KETONESUR NEGATIVE 06/05/2016 0415   PROTEINUR NEGATIVE 06/05/2016 0415   UROBILINOGEN 0.2 02/19/2015 1500   NITRITE NEGATIVE 06/05/2016 0415   LEUKOCYTESUR NEGATIVE 06/05/2016 0415   Sepsis Labs Invalid input(s): PROCALCITONIN,  WBC,  LACTICIDVEN     SIGNED:  Rosita Fire, MD  [facility] 07-01-16, 12:06 PM Pager   If 7PM-7AM, please contact night-coverage [email or web address] Password [email or web address]

## 2016-07-02 NOTE — Progress Notes (Addendum)
PROGRESS NOTE    Tom [name]  [id number] DOB: Oct 27, 1965 DOA: 06/11/2016 PCP: [name], MD   Brief Narrative: 51 y.o. 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 CT abd/pelvic showed though the tumors appears smaller, there is free air, suggestive of bowel perforation in either the small bowel or the sigmoid colon. Evaluated by general surgery and admitted for further evaluation. Not a surgical candidate as per general surgery. Pt is clinically declining. Transfer to ICU. After discussion with the multiple family members patient is DO NOT RESUSCITATE DO NOT INTUBATE and now comfort measures.  Assessment & Plan:   Principal Problem:   Bowel perforation ([place]) Active Problems:   Adenocarcinoma of colon metastatic to liver [facility])   Abdominal pain   Colon cancer metastasized to liver [facility])   Palliative care encounter   Goals of care, counseling/discussion   DNR (do not resuscitate) discussion  # Contained bowel perforation: Seen by general surgery.  -No surgery recommended by general surgery. Patient with rapid clinical decline with  shock, acute kidney injury. -On IV Zosyn and Flagyl. -Focus on comfort care. -Continue nothing by mouth and supportive measures.  #Goals of care discussion: I had a long discussion with multiple family members yesterday. Also discussed with the family members including his son and wife at bedside today. Patient with worsening acute hypoxic respiratory failure, acute kidney injury possibly due to perforated bowel. Patient looked uncomfortable and on respiratory distress. Focus on comfort measures therefore started on fentanyl IV. Evaluated by palliative care yesterday. We will not do any further labs or invasive testing.   #Pain management: Dilaudid and fentanyl as needed for pain. Ativan.  #stage IV metastatic colon cancer undergoing chemotherapy:  Comfort measures.  # shock: On phenylephrine drip. Monitor blood pressure and heart rate. IV Zosyn and Flagyl.  #Acute kidney injury due to septic shock, phenyephrine: Focus on comfort measures.  DVT prophylaxis: Heparin subcutaneous. Code Status: DO NOT RESUSCITATE comfort measures Family Communication: Wife and son at bedside. Discussed with them in detail Disposition Plan: Currently in ICU with clinical deterioration.    Consultants:   General surgery and palliative care  Oncology consulted  Procedures: None Antimicrobials: IV Zosyn  Subjective: A shunt was seen and examined at bedside. Patient was hyporesponsive this morning, gasping for breathing. Unable to obtain review of systems the patient.  Objective: Vitals:   06-27-16 0800 06-27-16 0810 2016/06/27 0820 06-27-16 0840  BP: 93/66   (!) 91/27  Pulse: (!) 129 (!) 134 (!) 124 100  Resp: (!) 41 (!) 39  (!) 33  Temp:      TempSrc:      SpO2: 90% (!) 82% 90% (!) 71%  Weight:      Height:        Intake/Output Summary (Last 24 hours) at 06/27/16 1036 Last data filed at 2016-06-27 0738  Gross per 24 hour  Intake          1226.67 ml  Output                0 ml  Net          1226.67 ml   Filed Weights   06/24/2016 2127 06/05/16 0144 06/27/16 0400  Weight: 99.3 kg (219 lb) 97.7 kg (215 lb 4.8 oz) 99 kg (218 lb 4.1 oz)    Examination:  General exam:Ill-looking male, gasping for breathing Respiratory system: Coarse breath sound bilateral, increased  accessory muscle for breathing Cardiovascular system: Tachycardic, regular, S1 and S2. Gastrointestinal system: Abdomen distended, firm. Sluggish bowel movement. Central nervous system: Hyporesponsive. Extremities: Unable to assess because of altered mental status Skin: No rashes, lesions or ulcers Psychiatry: Unable to assess.     Data Reviewed: I have personally reviewed following labs and imaging studies  CBC:  Recent Labs Lab 06/02/16 0835 06/09/2016 2223  06-16-16 0505  WBC 6.9 11.5* 5.5  NEUTROABS 4.4  --   --   HGB 13.7 15.0 16.1  HCT 40.8 44.6 49.4  MCV 89.1 88.7 91.5  PLT 266 253 [id number]   Basic Metabolic Panel:  Recent Labs Lab 06/02/16 0835 06/01/2016 2223 06/16/2016 0505  NA 138 136 140  K 3.6 4.3 5.0  CL 105 103 108  CO2 25 22 13*  GLUCOSE 143* 127* 93  BUN 13 25* 49*  CREATININE 0.69 0.91 4.26*  CALCIUM 8.5* 9.0 8.1*  MG 2.0  --   --    GFR: Estimated Creatinine Clearance: 23 mL/min (by C-G formula based on SCr of 4.26 mg/dL (H)). Liver Function Tests:  Recent Labs Lab 06/02/16 0835 06/03/2016 2223  AST 29 27  ALT 34 38  ALKPHOS 82 78  BILITOT 0.1* 0.8  PROT 6.7 7.4  ALBUMIN 3.8 4.4    Recent Labs Lab 06/07/2016 2223  LIPASE 20   No results for input(s): AMMONIA in the last 168 hours. Coagulation Profile: No results for input(s): INR, PROTIME in the last 168 hours. Cardiac Enzymes: No results for input(s): CKTOTAL, CKMB, CKMBINDEX, TROPONINI in the last 168 hours. BNP (last 3 results) No results for input(s): PROBNP in the last 8760 hours. HbA1C: No results for input(s): HGBA1C in the last 72 hours. CBG: No results for input(s): GLUCAP in the last 168 hours. Lipid Profile: No results for input(s): CHOL, HDL, LDLCALC, TRIG, CHOLHDL, LDLDIRECT in the last 72 hours. Thyroid Function Tests: No results for input(s): TSH, T4TOTAL, FREET4, T3FREE, THYROIDAB in the last 72 hours. Anemia Panel: No results for input(s): VITAMINB12, FOLATE, FERRITIN, TIBC, IRON, RETICCTPCT in the last 72 hours. Sepsis Labs: No results for input(s): PROCALCITON, LATICACIDVEN in the last 168 hours.  Recent Results (from the past 240 hour(s))  MRSA PCR Screening     Status: None   Collection Time: 06/05/16  4:05 PM  Result Value Ref Range Status   MRSA by PCR NEGATIVE NEGATIVE Final    Comment:        The GeneXpert MRSA Assay (FDA approved for NASAL specimens only), is one component of a comprehensive MRSA  colonization surveillance program. It is not intended to diagnose MRSA infection nor to guide or monitor treatment for MRSA infections.          Radiology Studies: Ct Abdomen Pelvis W Contrast  Result Date: 06/05/2016 CLINICAL DATA:  Sudden onset of severe lower abdominal pain at 18:00. EXAM: CT ABDOMEN AND PELVIS WITH CONTRAST TECHNIQUE: Multidetector CT imaging of the abdomen and pelvis was performed using the standard protocol following bolus administration of intravenous contrast. CONTRAST:  128mL ISOVUE-300 IOPAMIDOL (ISOVUE-300) INJECTION 61% COMPARISON:  04/14/2016, 10/29/2015 FINDINGS: Lower chest: No acute abnormality. Hepatobiliary: Multiple hepatic metastases, many of which appear smaller than on 04/14/2016. For example the metastatic lesion adjacent to the gallbladder on image 28 series 2 measures 2.4 x 2.6 cm and previously measured 4.3 x 5.0 cm. No conclusive new lesions. Gallbladder and bile ducts are unremarkable. Pancreas: Unremarkable. No pancreatic ductal dilatation or surrounding inflammatory changes. Spleen: Normal in  size without focal abnormality. Adrenals/Urinary Tract: Adrenal glands are unremarkable. Benign upper pole and midpole left renal cysts measuring up to 2.6 cm, unchanged. Kidneys are otherwise normal, without renal calculi, focal lesion, or hydronephrosis. Bladder is remarkable only for mild mass effect on the right lateral bladder dome due to the adjacent soft tissue process. Stomach/Bowel: The cicatrizing soft tissue around the distal portions of the inferior mesenteric artery is now associated with a local bowel perforation. The mid sigmoid colon and portions of the ileum are matted together and acutely inflamed. There are several bubbles of extraluminal gas in the nearby mesentery. There is at least 1 focus of distal extraluminal gas, located anteriorly in the right upper quadrant on axial image 44 series 2 and on coronal image 18 series 4. No significant  obstruction. It is unclear whether the extraluminal gas arises from the sigmoid or from the small bowel. The stomach and proximal small bowel are unremarkable. Remainder of the colon is unremarkable. Inflammatory stranding extends out from the low midline abdomen over into the right lower quadrant pericolic gutter and also over to the left pericolic gutter. Vascular/Lymphatic: Mild atherosclerotic calcification of the normal caliber aorta. 1 x 2 cm soft tissue surrounding the IMA origin, unchanged. Reproductive: Unremarkable Other: Trace fluid in the pericolic gutters. No significant ascites. Musculoskeletal: No significant skeletal lesions. IMPRESSION: 1. Acute inflammation and focal perforation of bowel in the low midline abdomen, at the location of the previously observed cicatrizing soft tissue mass. Several bubbles of extraluminal gas are present in the nearby mesentery and there is at least 1 focus of distant gas visible in the right upper quadrant peritoneum. At the location of the inflammation/perforation, there are loops of ileum and mid sigmoid colon matted together, and it is indeterminate whether the perforation arises from the sigmoid or from the small bowel. No significant obstruction. These results were called by telephone at the time of interpretation on 06/05/2016 at 12:03 am to Dr. Pattricia Boss , who verbally acknowledged these results. 2. No other acute findings. 3. Decreased size of many of the hepatic metastases. No conclusive new metastases to the liver. Electronically Signed   By: Andreas Newport M.D.   On: 06/05/2016 00:13        Scheduled Meds: . heparin  5,000 Units Subcutaneous Q8H  . metronidazole  500 mg Intravenous Q8H  . piperacillin-tazobactam  3.375 g Intravenous Q8H  . sodium chloride flush  3 mL Intravenous Q12H   Continuous Infusions: . sodium chloride 100 mL/hr at 2016-06-20 0133  . phenylephrine (NEO-SYNEPHRINE) Adult infusion 115 mcg/min (20-Jun-2016 0800)     LOS:  1 day    Henessy Rohrer Tanna Furry, MD [facility] Pager [phone]  If 7PM-7AM, please contact night-coverage [email or web address] Password [email or web address] 06-20-16, 10:36 AM

## 2016-07-02 NOTE — Progress Notes (Signed)
Attempted to insert 57F Foley catheter. No urine return. Pt had some bleeding from penis. Removed catheter. May try again later with a smaller french catheter.

## 2016-07-02 NOTE — Progress Notes (Signed)
Status reviewed. Will follow peripherally. Please call me if I can be of further assistance.

## 2016-07-02 NOTE — Progress Notes (Signed)
**Note De-Identified Stefan Karen Obfuscation** Patient placed on 40L HFNC 100%; tolerating moderately well.  SAT 90% with poor perfusion.  Patient on comfort measures.  RT to continue monitoring.

## 2016-07-02 NOTE — Progress Notes (Signed)
NO HEART BEAT. NO RESPIRATIONS. NO BP. PT PRONOUNCED EXPIRED BY THIS RN. FAMILY AT BEDSIDE. DR [facility] NOTIFIED. [place] [employer] NOTIFIED.

## 2016-07-02 DEATH — deceased

## 2016-07-07 DIAGNOSIS — C189 Malignant neoplasm of colon, unspecified: Secondary | ICD-10-CM

## 2016-12-29 IMAGING — MR MR LUMBAR SPINE WO/W CM
4 of 7 series · 12 of 48 positions shown · IV contrast (multihance)
Comparison: None.

CLINICAL DATA: Low back pain with left hip pain for the past couple
years. No known injury.

EXAM:
MRI LUMBAR SPINE WITHOUT AND WITH CONTRAST
TECHNIQUE: Multiplanar and multiecho pulse sequences of the lumbar spine were
obtained without and with intravenous contrast.
CONTRAST:  20mL MULTIHANCE GADOBENATE DIMEGLUMINE 529 MG/ML IV SOLN

[Series 3: T1 · sagittal · 4.0mm · 0.35mm/px · 3 of 15 slices shown (1 of 2)]
[im 1/15]
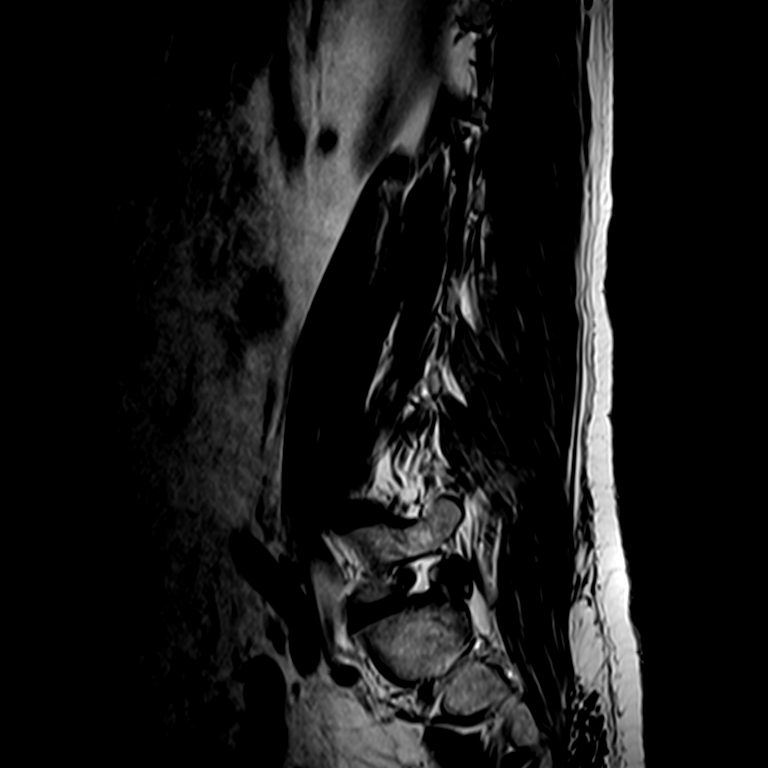
[im 8/15]
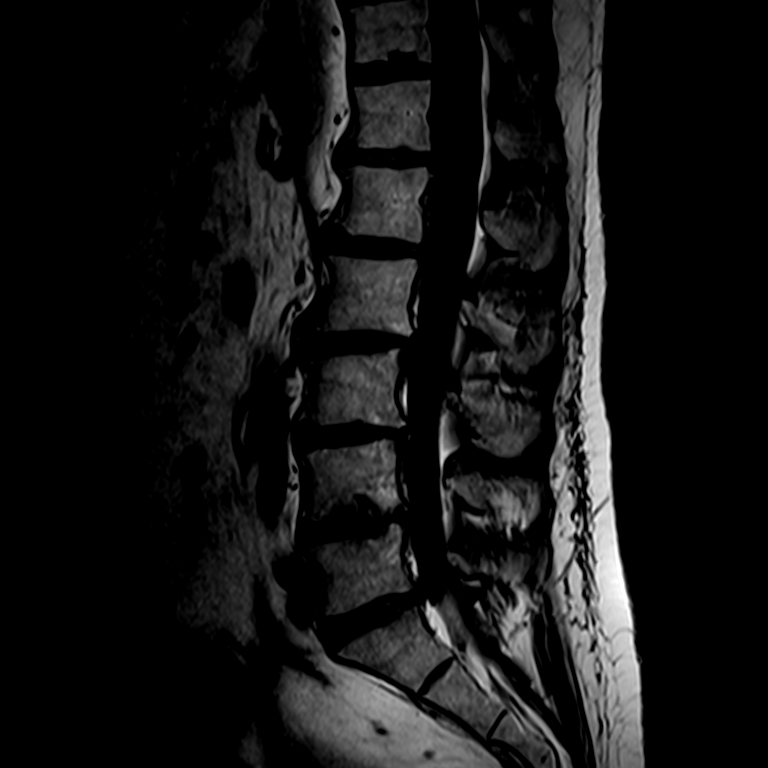
[im 15/15]
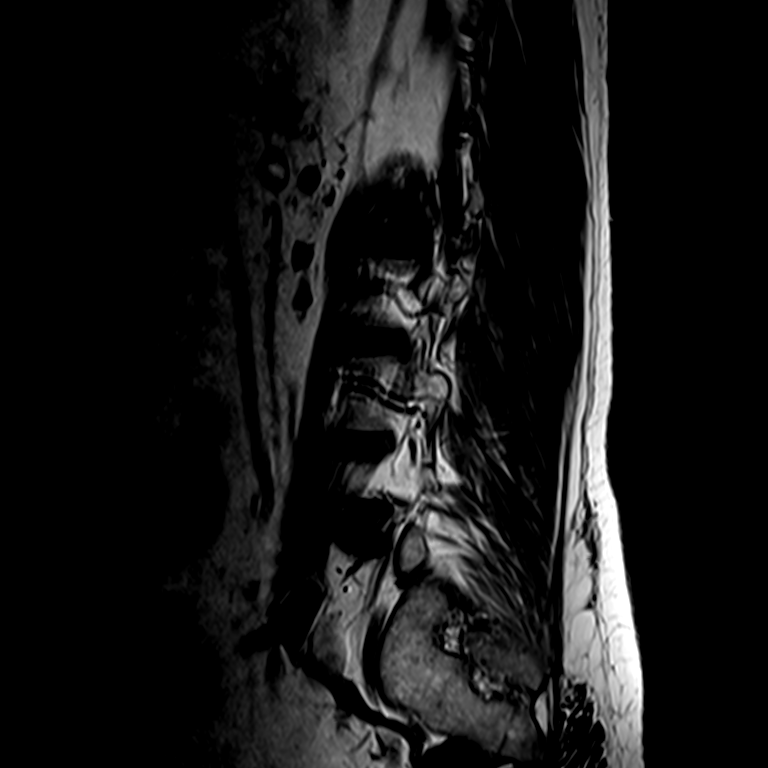

[Series 5: T2 · axial · 4.0mm · 0.20mm/px · z∈[-129,+28]mm · 3 of 44 slices shown]
[im 5/44]
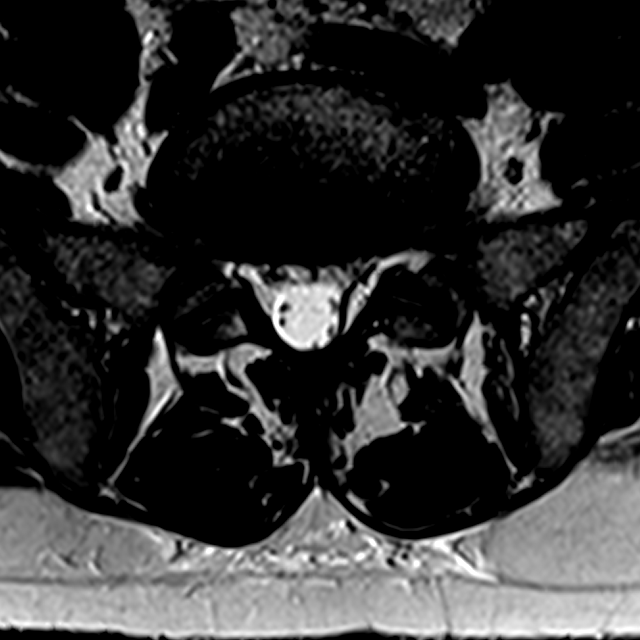
[im 22/44]
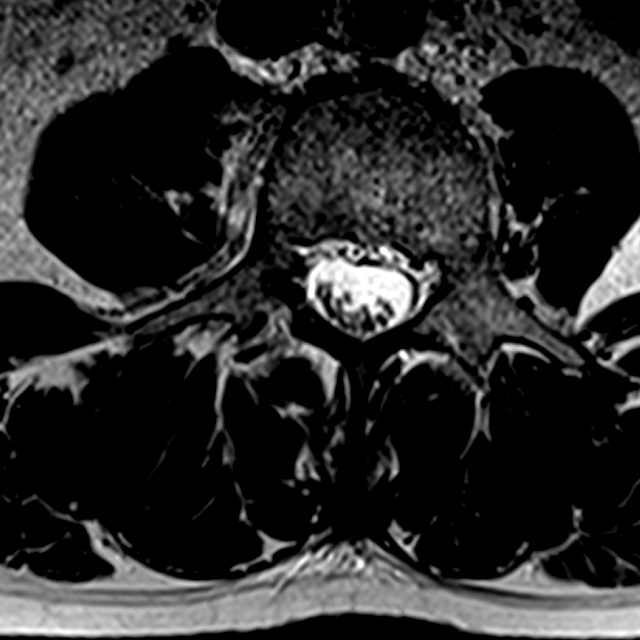
[im 39/44]
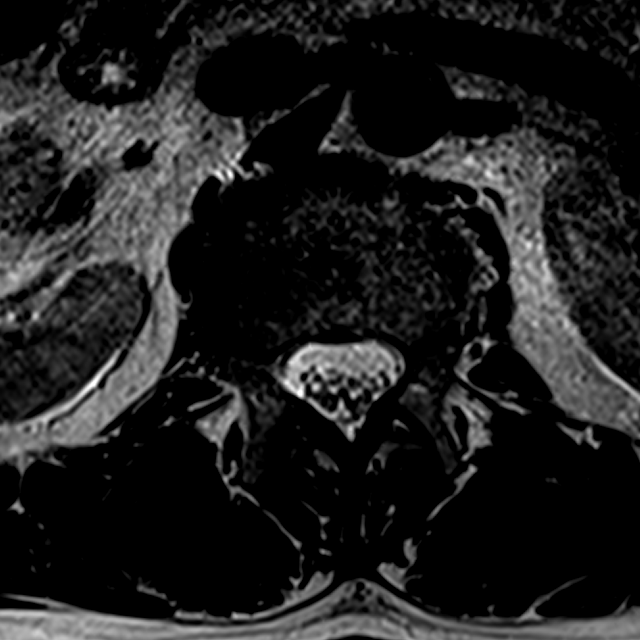

[Series 6: T1 · axial · 4.0mm · 0.20mm/px · z∈[-131,+28]mm · 3 of 44 slices shown (2 of 2)]
[im 5/44]
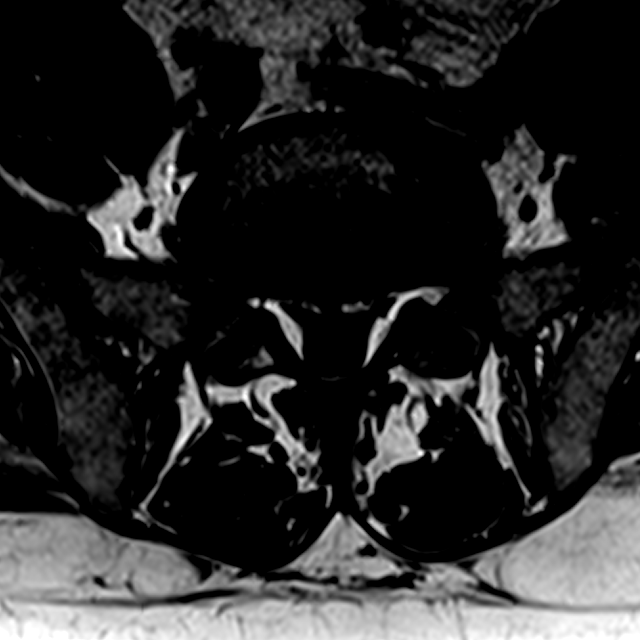
[im 22/44]
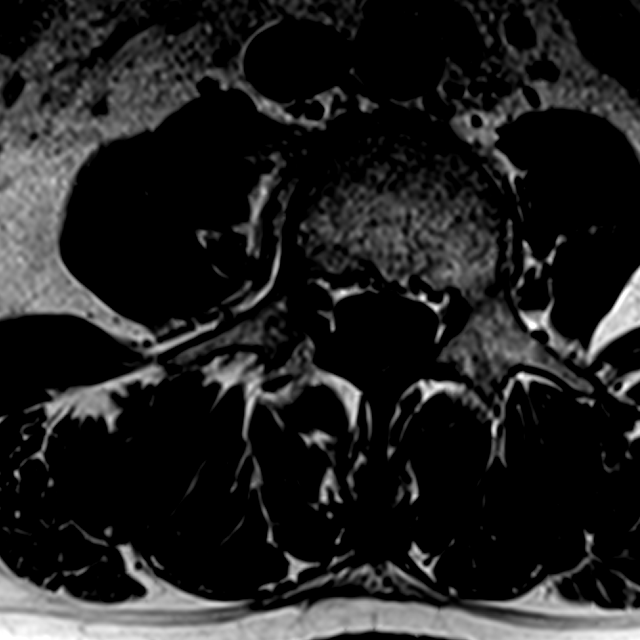
[im 39/44]
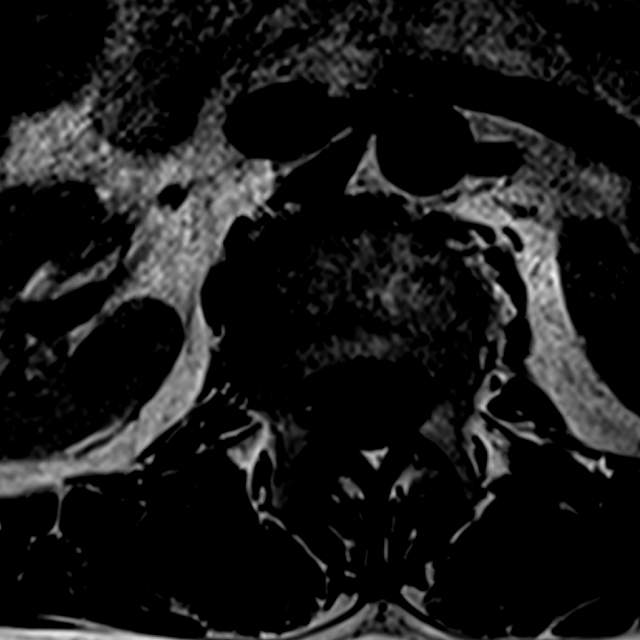

[Series 7: T2 post-contrast · sagittal · 4.0mm · 0.49mm/px · 3 of 17 slices shown]
[im 1/17]
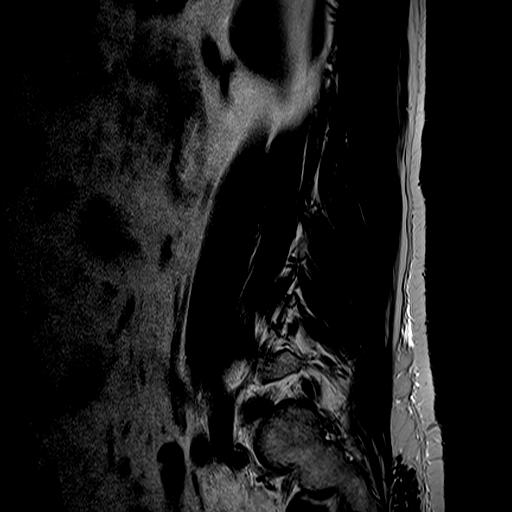
[im 11/17]
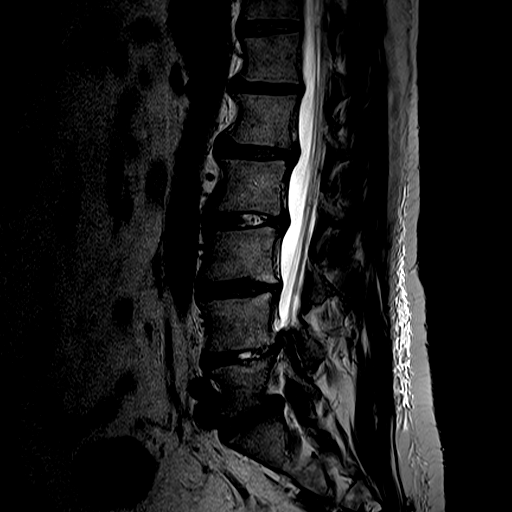
[im 17/17]
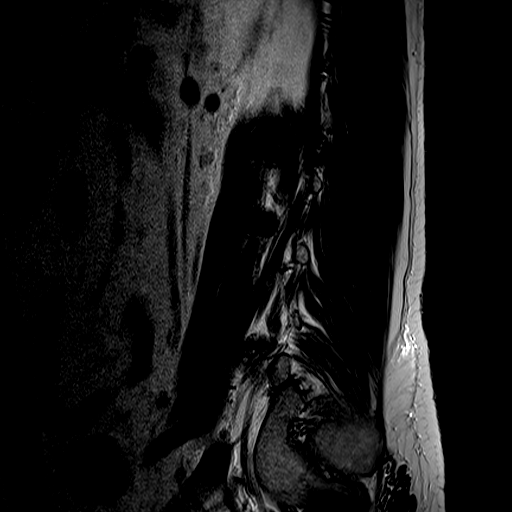

[12 of 48 positions shown; findings below may reference images not displayed]

FINDINGS: Segmentation:  Standard.

Alignment:  Physiologic.

Vertebrae:  No fracture, evidence of discitis, or bone lesion.

Conus medullaris: Extends to the T12 level and appears normal.

Paraspinal and other soft tissues: No paraspinal abnormality.

Disc levels:

Disc spaces: Degenerative disc disease with disc height loss at
L2-3, L3-4, L4-5 and L5-S1.

T12-L1: Broad-based disc bulge with a small central disc protrusion.
No evidence of neural foraminal stenosis. No central canal stenosis.

L1-L2: Broad-based disc bulge. No evidence of neural foraminal
stenosis. No central canal stenosis.

L2-L3: Broad-based disc bulge with a small central disc protrusion.
Shallow right foraminal disc protrusion. No evidence of neural
foraminal stenosis. No central canal stenosis.

L3-L4: Broad-based disc bulge eccentric towards the right. Mild
bilateral facet arthropathy. No evidence of neural foraminal
stenosis. No central canal stenosis.

L4-L5: Broad-based disc bulge with a small central disc protrusion.
Bilateral lateral recess stenosis. Mild bilateral facet arthropathy.
No evidence of neural foraminal stenosis. No central canal stenosis.

L5-S1: Mild broad-based disc bulge. Mild left foraminal stenosis. No
right foraminal stenosis. No central canal stenosis.
IMPRESSION: 1. Lumbar spine spondylosis as described above.
2. At L4-5 there is a broad-based disc bulge with a small central
disc protrusion. Bilateral lateral recess stenosis. Mild bilateral
facet arthropathy.
3. No evidence of osseous malignancy.
